# Patient Record
Sex: Female | Born: 1960
Health system: Southern US, Community
[De-identification: ages and names within clinical notes are randomized; demographics above are authoritative.]

## PROBLEM LIST (undated history)

## (undated) DIAGNOSIS — F329 Major depressive disorder, single episode, unspecified: Secondary | ICD-10-CM

## (undated) DIAGNOSIS — K5281 Eosinophilic gastritis or gastroenteritis: Secondary | ICD-10-CM

## (undated) DIAGNOSIS — F419 Anxiety disorder, unspecified: Secondary | ICD-10-CM

## (undated) DIAGNOSIS — Z9889 Other specified postprocedural states: Secondary | ICD-10-CM

## (undated) DIAGNOSIS — Z800 Family history of malignant neoplasm of digestive organs: Secondary | ICD-10-CM

## (undated) DIAGNOSIS — B9681 Helicobacter pylori [H. pylori] as the cause of diseases classified elsewhere: Secondary | ICD-10-CM

## (undated) DIAGNOSIS — K5790 Diverticulosis of intestine, part unspecified, without perforation or abscess without bleeding: Secondary | ICD-10-CM

## (undated) DIAGNOSIS — K2970 Gastritis, unspecified, without bleeding: Secondary | ICD-10-CM

## (undated) DIAGNOSIS — K279 Peptic ulcer, site unspecified, unspecified as acute or chronic, without hemorrhage or perforation: Secondary | ICD-10-CM

## (undated) DIAGNOSIS — R51 Headache: Secondary | ICD-10-CM

## (undated) DIAGNOSIS — F32A Depression, unspecified: Secondary | ICD-10-CM

## (undated) DIAGNOSIS — I10 Essential (primary) hypertension: Secondary | ICD-10-CM

## (undated) HISTORY — DX: Depression, unspecified: F32.A

## (undated) HISTORY — DX: Helicobacter pylori (H. pylori) as the cause of diseases classified elsewhere: B96.81

## (undated) HISTORY — DX: Peptic ulcer, site unspecified, unspecified as acute or chronic, without hemorrhage or perforation: K27.9

## (undated) HISTORY — DX: Gastritis, unspecified, without bleeding: K29.70

## (undated) HISTORY — DX: Other specified postprocedural states: Z98.890

## (undated) HISTORY — DX: Major depressive disorder, single episode, unspecified: F32.9

## (undated) HISTORY — DX: Anxiety disorder, unspecified: F41.9

## (undated) HISTORY — DX: Headache: R51

## (undated) HISTORY — PX: ABDOMINAL HYSTERECTOMY: SHX81

## (undated) HISTORY — DX: Eosinophilic gastritis or gastroenteritis: K52.81

## (undated) HISTORY — PX: TUBAL LIGATION: SHX77

## (undated) HISTORY — DX: Family history of malignant neoplasm of digestive organs: Z80.0

## (undated) HISTORY — DX: Helicobacter pylori (H. pylori) as the cause of diseases classified elsewhere: K29.70

---

## 1987-07-16 HISTORY — PX: OTHER SURGICAL HISTORY: SHX169

## 1995-07-16 HISTORY — PX: CHOLECYSTECTOMY: SHX55

## 2001-07-21 ENCOUNTER — Encounter: Payer: Self-pay | Admitting: Obstetrics and Gynecology

## 2001-07-21 ENCOUNTER — Ambulatory Visit (HOSPITAL_COMMUNITY): Admission: RE | Admit: 2001-07-21 | Discharge: 2001-07-21 | Payer: Self-pay | Admitting: Obstetrics and Gynecology

## 2003-07-11 ENCOUNTER — Emergency Department (HOSPITAL_COMMUNITY): Admission: EM | Admit: 2003-07-11 | Discharge: 2003-07-11 | Payer: Self-pay | Admitting: Emergency Medicine

## 2003-10-19 ENCOUNTER — Emergency Department (HOSPITAL_COMMUNITY): Admission: EM | Admit: 2003-10-19 | Discharge: 2003-10-19 | Payer: Self-pay | Admitting: Emergency Medicine

## 2003-12-09 ENCOUNTER — Ambulatory Visit (HOSPITAL_COMMUNITY): Admission: RE | Admit: 2003-12-09 | Discharge: 2003-12-09 | Payer: Self-pay | Admitting: Family Medicine

## 2003-12-27 ENCOUNTER — Ambulatory Visit (HOSPITAL_COMMUNITY): Admission: RE | Admit: 2003-12-27 | Discharge: 2003-12-27 | Payer: Self-pay | Admitting: Internal Medicine

## 2003-12-27 DIAGNOSIS — Z9889 Other specified postprocedural states: Secondary | ICD-10-CM

## 2003-12-27 HISTORY — DX: Other specified postprocedural states: Z98.890

## 2003-12-27 LAB — HM COLONOSCOPY: HM Colonoscopy: NORMAL

## 2004-08-15 ENCOUNTER — Ambulatory Visit: Payer: Self-pay | Admitting: Family Medicine

## 2004-08-30 ENCOUNTER — Ambulatory Visit (HOSPITAL_COMMUNITY): Admission: RE | Admit: 2004-08-30 | Discharge: 2004-08-30 | Payer: Self-pay | Admitting: Family Medicine

## 2004-10-22 ENCOUNTER — Ambulatory Visit: Payer: Self-pay | Admitting: Family Medicine

## 2005-04-05 ENCOUNTER — Ambulatory Visit: Payer: Self-pay | Admitting: Family Medicine

## 2005-07-15 HISTORY — PX: PARTIAL HYSTERECTOMY: SHX80

## 2005-11-29 ENCOUNTER — Ambulatory Visit: Payer: Self-pay | Admitting: Family Medicine

## 2006-02-04 ENCOUNTER — Other Ambulatory Visit: Admission: RE | Admit: 2006-02-04 | Discharge: 2006-02-04 | Payer: Self-pay | Admitting: Family Medicine

## 2006-02-04 ENCOUNTER — Encounter: Payer: Self-pay | Admitting: Family Medicine

## 2006-02-04 ENCOUNTER — Encounter (INDEPENDENT_AMBULATORY_CARE_PROVIDER_SITE_OTHER): Payer: Self-pay | Admitting: *Deleted

## 2006-02-04 ENCOUNTER — Ambulatory Visit: Payer: Self-pay | Admitting: Family Medicine

## 2006-02-04 LAB — CONVERTED CEMR LAB: Pap Smear: NORMAL

## 2006-04-23 ENCOUNTER — Encounter (INDEPENDENT_AMBULATORY_CARE_PROVIDER_SITE_OTHER): Payer: Self-pay | Admitting: Specialist

## 2006-04-23 ENCOUNTER — Ambulatory Visit (HOSPITAL_COMMUNITY): Admission: RE | Admit: 2006-04-23 | Discharge: 2006-04-24 | Payer: Self-pay | Admitting: Obstetrics and Gynecology

## 2006-05-15 ENCOUNTER — Ambulatory Visit (HOSPITAL_COMMUNITY): Admission: RE | Admit: 2006-05-15 | Discharge: 2006-05-15 | Payer: Self-pay | Admitting: Family Medicine

## 2006-05-27 ENCOUNTER — Encounter: Admission: RE | Admit: 2006-05-27 | Discharge: 2006-05-27 | Payer: Self-pay | Admitting: Family Medicine

## 2006-12-26 ENCOUNTER — Ambulatory Visit: Payer: Self-pay | Admitting: Family Medicine

## 2006-12-26 LAB — CONVERTED CEMR LAB
Calcium: 10 mg/dL (ref 8.4–10.5)
Chloride: 107 meq/L (ref 96–112)
Creatinine, Ser: 0.9 mg/dL (ref 0.40–1.20)
Glucose, Bld: 98 mg/dL (ref 70–99)
Potassium: 4.2 meq/L (ref 3.5–5.3)

## 2007-03-23 ENCOUNTER — Encounter: Payer: Self-pay | Admitting: Family Medicine

## 2007-03-23 ENCOUNTER — Ambulatory Visit: Payer: Self-pay | Admitting: Family Medicine

## 2007-03-23 ENCOUNTER — Encounter (INDEPENDENT_AMBULATORY_CARE_PROVIDER_SITE_OTHER): Payer: Self-pay | Admitting: *Deleted

## 2007-03-23 ENCOUNTER — Other Ambulatory Visit: Admission: RE | Admit: 2007-03-23 | Discharge: 2007-03-23 | Payer: Self-pay | Admitting: Family Medicine

## 2007-03-24 ENCOUNTER — Encounter: Payer: Self-pay | Admitting: Family Medicine

## 2007-03-26 ENCOUNTER — Encounter: Payer: Self-pay | Admitting: Family Medicine

## 2007-03-26 LAB — CONVERTED CEMR LAB
ALT: 10 units/L (ref 0–35)
AST: 13 units/L (ref 0–37)
Alkaline Phosphatase: 147 units/L — ABNORMAL HIGH (ref 39–117)
BUN: 13 mg/dL (ref 6–23)
Basophils Absolute: 0 10*3/uL (ref 0.0–0.1)
Calcium: 8.8 mg/dL (ref 8.4–10.5)
Cholesterol: 124 mg/dL (ref 0–200)
Creatinine, Ser: 0.9 mg/dL (ref 0.40–1.20)
Eosinophils Relative: 2 % (ref 0–5)
Glucose, Bld: 98 mg/dL (ref 70–99)
HCT: 40 % (ref 36.0–46.0)
Indirect Bilirubin: 0.5 mg/dL (ref 0.0–0.9)
Lymphocytes Relative: 34 % (ref 12–46)
Lymphs Abs: 2.2 10*3/uL (ref 0.7–3.3)
MCHC: 34 g/dL (ref 30.0–36.0)
Neutro Abs: 3.6 10*3/uL (ref 1.7–7.7)
Neutrophils Relative %: 57 % (ref 43–77)
RBC: 4.44 M/uL (ref 3.87–5.11)
Total Bilirubin: 0.7 mg/dL (ref 0.3–1.2)
Triglycerides: 71 mg/dL (ref <150)
VLDL: 14 mg/dL (ref 0–40)

## 2007-03-31 ENCOUNTER — Encounter: Payer: Self-pay | Admitting: Family Medicine

## 2007-06-23 ENCOUNTER — Ambulatory Visit (HOSPITAL_COMMUNITY): Admission: RE | Admit: 2007-06-23 | Discharge: 2007-06-23 | Payer: Self-pay | Admitting: Family Medicine

## 2007-07-14 ENCOUNTER — Ambulatory Visit: Payer: Self-pay | Admitting: Family Medicine

## 2007-07-16 ENCOUNTER — Encounter: Payer: Self-pay | Admitting: Family Medicine

## 2007-11-11 ENCOUNTER — Encounter (INDEPENDENT_AMBULATORY_CARE_PROVIDER_SITE_OTHER): Payer: Self-pay | Admitting: *Deleted

## 2007-11-11 DIAGNOSIS — F411 Generalized anxiety disorder: Secondary | ICD-10-CM | POA: Insufficient documentation

## 2007-11-11 DIAGNOSIS — R51 Headache: Secondary | ICD-10-CM

## 2007-11-11 DIAGNOSIS — F329 Major depressive disorder, single episode, unspecified: Secondary | ICD-10-CM

## 2007-11-11 DIAGNOSIS — R519 Headache, unspecified: Secondary | ICD-10-CM | POA: Insufficient documentation

## 2007-12-23 ENCOUNTER — Ambulatory Visit: Payer: Self-pay | Admitting: Family Medicine

## 2008-03-30 ENCOUNTER — Ambulatory Visit: Payer: Self-pay | Admitting: Family Medicine

## 2008-03-30 ENCOUNTER — Encounter: Payer: Self-pay | Admitting: Family Medicine

## 2008-03-30 ENCOUNTER — Other Ambulatory Visit: Admission: RE | Admit: 2008-03-30 | Discharge: 2008-03-30 | Payer: Self-pay | Admitting: Family Medicine

## 2008-03-30 DIAGNOSIS — N63 Unspecified lump in unspecified breast: Secondary | ICD-10-CM | POA: Insufficient documentation

## 2008-03-30 DIAGNOSIS — N644 Mastodynia: Secondary | ICD-10-CM

## 2008-03-30 DIAGNOSIS — R5383 Other fatigue: Secondary | ICD-10-CM

## 2008-03-30 DIAGNOSIS — E669 Obesity, unspecified: Secondary | ICD-10-CM

## 2008-03-30 DIAGNOSIS — R079 Chest pain, unspecified: Secondary | ICD-10-CM | POA: Insufficient documentation

## 2008-03-30 DIAGNOSIS — R5381 Other malaise: Secondary | ICD-10-CM | POA: Insufficient documentation

## 2008-03-30 LAB — CONVERTED CEMR LAB: Pap Smear: NORMAL

## 2008-04-04 ENCOUNTER — Ambulatory Visit (HOSPITAL_COMMUNITY): Admission: RE | Admit: 2008-04-04 | Discharge: 2008-04-04 | Payer: Self-pay | Admitting: Family Medicine

## 2008-06-30 ENCOUNTER — Ambulatory Visit: Payer: Self-pay | Admitting: Family Medicine

## 2008-07-13 ENCOUNTER — Ambulatory Visit (HOSPITAL_COMMUNITY): Admission: RE | Admit: 2008-07-13 | Discharge: 2008-07-13 | Payer: Self-pay | Admitting: Family Medicine

## 2008-08-05 ENCOUNTER — Encounter: Payer: Self-pay | Admitting: Family Medicine

## 2008-08-29 ENCOUNTER — Telehealth: Payer: Self-pay | Admitting: Family Medicine

## 2009-02-21 ENCOUNTER — Encounter: Payer: Self-pay | Admitting: Family Medicine

## 2009-03-16 ENCOUNTER — Ambulatory Visit: Payer: Self-pay | Admitting: Family Medicine

## 2009-03-17 ENCOUNTER — Encounter: Payer: Self-pay | Admitting: Family Medicine

## 2009-03-22 ENCOUNTER — Ambulatory Visit (HOSPITAL_COMMUNITY): Admission: RE | Admit: 2009-03-22 | Discharge: 2009-03-22 | Payer: Self-pay | Admitting: Neurology

## 2009-03-27 ENCOUNTER — Encounter: Payer: Self-pay | Admitting: Family Medicine

## 2009-06-26 ENCOUNTER — Encounter (INDEPENDENT_AMBULATORY_CARE_PROVIDER_SITE_OTHER): Payer: Self-pay | Admitting: *Deleted

## 2009-08-29 ENCOUNTER — Ambulatory Visit: Payer: Self-pay | Admitting: Family Medicine

## 2009-08-29 DIAGNOSIS — R42 Dizziness and giddiness: Secondary | ICD-10-CM

## 2009-09-01 ENCOUNTER — Ambulatory Visit (HOSPITAL_COMMUNITY): Admission: RE | Admit: 2009-09-01 | Discharge: 2009-09-01 | Payer: Self-pay | Admitting: Family Medicine

## 2009-10-17 ENCOUNTER — Other Ambulatory Visit: Admission: RE | Admit: 2009-10-17 | Discharge: 2009-10-17 | Payer: Self-pay | Admitting: Family Medicine

## 2009-10-17 ENCOUNTER — Ambulatory Visit: Payer: Self-pay | Admitting: Family Medicine

## 2009-10-17 LAB — CONVERTED CEMR LAB
OCCULT 1: NEGATIVE
Pap Smear: NEGATIVE

## 2009-10-18 LAB — CONVERTED CEMR LAB
BUN: 11 mg/dL (ref 6–23)
CO2: 19 meq/L (ref 19–32)
Eosinophils Absolute: 0.1 10*3/uL (ref 0.0–0.7)
HCT: 40.6 % (ref 36.0–46.0)
LDL Cholesterol: 58 mg/dL (ref 0–99)
Lymphocytes Relative: 38 % (ref 12–46)
MCHC: 32.5 g/dL (ref 30.0–36.0)
MCV: 90.8 fL (ref 78.0–100.0)
Monocytes Relative: 7 % (ref 3–12)
Neutro Abs: 3.5 10*3/uL (ref 1.7–7.7)
Neutrophils Relative %: 53 % (ref 43–77)
Potassium: 4 meq/L (ref 3.5–5.3)
TSH: 0.38 microintl units/mL (ref 0.350–4.500)
Total CHOL/HDL Ratio: 2.9
Triglycerides: 99 mg/dL (ref <150)
VLDL: 20 mg/dL (ref 0–40)
Vit D, 25-Hydroxy: 10 ng/mL — ABNORMAL LOW (ref 30–89)

## 2009-11-17 ENCOUNTER — Encounter: Payer: Self-pay | Admitting: Family Medicine

## 2010-01-05 ENCOUNTER — Encounter (INDEPENDENT_AMBULATORY_CARE_PROVIDER_SITE_OTHER): Payer: Self-pay

## 2010-01-05 ENCOUNTER — Encounter: Payer: Self-pay | Admitting: Family Medicine

## 2010-01-05 ENCOUNTER — Ambulatory Visit: Payer: Self-pay | Admitting: Gastroenterology

## 2010-01-05 ENCOUNTER — Inpatient Hospital Stay (HOSPITAL_COMMUNITY): Admission: EM | Admit: 2010-01-05 | Discharge: 2010-01-06 | Payer: Self-pay | Admitting: Emergency Medicine

## 2010-01-08 ENCOUNTER — Telehealth: Payer: Self-pay | Admitting: Family Medicine

## 2010-01-12 ENCOUNTER — Ambulatory Visit: Payer: Self-pay | Admitting: Family Medicine

## 2010-01-12 DIAGNOSIS — K269 Duodenal ulcer, unspecified as acute or chronic, without hemorrhage or perforation: Secondary | ICD-10-CM | POA: Insufficient documentation

## 2010-01-12 DIAGNOSIS — A048 Other specified bacterial intestinal infections: Secondary | ICD-10-CM | POA: Insufficient documentation

## 2010-01-17 ENCOUNTER — Telehealth (INDEPENDENT_AMBULATORY_CARE_PROVIDER_SITE_OTHER): Payer: Self-pay | Admitting: *Deleted

## 2010-03-01 ENCOUNTER — Encounter: Payer: Self-pay | Admitting: Gastroenterology

## 2010-03-12 ENCOUNTER — Encounter: Payer: Self-pay | Admitting: Family Medicine

## 2010-03-26 ENCOUNTER — Emergency Department (HOSPITAL_COMMUNITY): Admission: EM | Admit: 2010-03-26 | Discharge: 2010-03-26 | Payer: Self-pay | Admitting: Emergency Medicine

## 2010-03-26 ENCOUNTER — Ambulatory Visit: Payer: Self-pay | Admitting: Family Medicine

## 2010-03-26 LAB — CONVERTED CEMR LAB
Bilirubin Urine: NEGATIVE
Blood in Urine, dipstick: NEGATIVE
Glucose, Urine, Semiquant: NEGATIVE
Ketones, urine, test strip: NEGATIVE
Nitrite: NEGATIVE
Protein, U semiquant: NEGATIVE
Specific Gravity, Urine: >=1.03
Urobilinogen, UA: 0.2
WBC Urine, dipstick: NEGATIVE
pH: 5

## 2010-03-29 ENCOUNTER — Encounter: Payer: Self-pay | Admitting: Physician Assistant

## 2010-03-29 ENCOUNTER — Ambulatory Visit: Payer: Self-pay | Admitting: Family Medicine

## 2010-03-29 DIAGNOSIS — N83209 Unspecified ovarian cyst, unspecified side: Secondary | ICD-10-CM

## 2010-07-05 ENCOUNTER — Ambulatory Visit: Payer: Self-pay | Admitting: Family Medicine

## 2010-07-06 ENCOUNTER — Encounter: Payer: Self-pay | Admitting: Family Medicine

## 2010-08-05 ENCOUNTER — Encounter: Payer: Self-pay | Admitting: Family Medicine

## 2010-08-12 LAB — CONVERTED CEMR LAB
BUN: 10 mg/dL (ref 6–23)
CO2: 20 meq/L (ref 19–32)
Calcium: 8.8 mg/dL (ref 8.4–10.5)
Cholesterol: 132 mg/dL (ref 0–200)
Creatinine, Ser: 0.91 mg/dL (ref 0.40–1.20)
Glucose, Bld: 82 mg/dL (ref 70–99)
HDL: 43 mg/dL (ref >39)
Lymphocytes Relative: 40 % (ref 12–46)
MCHC: 33.4 g/dL (ref 30.0–36.0)
MCV: 90.4 fL (ref 78.0–100.0)
Monocytes Relative: 8 % (ref 3–12)
Platelets: 264 10*3/uL (ref 150–400)
Potassium: 4.2 meq/L (ref 3.5–5.3)
RBC: 4.27 M/uL (ref 3.87–5.11)
RDW: 12.6 % (ref 11.5–15.5)
Total CHOL/HDL Ratio: 3.1
Triglycerides: 104 mg/dL (ref <150)
VLDL: 21 mg/dL (ref 0–40)
WBC: 6.2 10*3/uL (ref 4.0–10.5)

## 2010-08-14 NOTE — Assessment & Plan Note (Signed)
Summary: abdominal pain- room 2   Vital Signs:  Patient profile:   50 year old female Menstrual status:  hysterectomy Height:      64 inches Weight:      185 pounds BMI:     31.87 O2 Sat:      99 % on Room air Temp:     98.6 degrees F oral Pulse rate:   88 / minute Resp:     16 per minute BP sitting:   130 / 70  (left arm)  Vitals Entered By: Adella Hare LPN (March 26, 2010 2:52 PM) CC: lower abdominal pain Is Patient Diabetic? No   CC:  lower abdominal pain.  History of Present Illness: Pt presents today with c/o onset of lower abd pain at midnight last night.  Since then has moved more to RLQ.  Nausea today but no vomiting.  Denies fever or chills.  No BM today but had nl BM yesterday.    Allergies (verified): 1)  ! Codeine 2)  ! Hydrocodone  Past History:  Past medical history reviewed for relevance to current acute and chronic problems.  Past Medical History: Reviewed history from 01/12/2010 and no changes required. Current Problems:  ANXIETY (ICD-300.00) DEPRESSION, MILD (ICD-311) HEADACHE (ICD-784.0) duoodenal ulcer, duodenitis and gastritis hospitalised 6/23 to 6 25/2011 because of this h pylori disease treated in june 2011   Review of Systems General:  Complains of loss of appetite. CV:  Denies chest pain or discomfort. GI:  Complains of abdominal pain, loss of appetite, and nausea. GU:  Denies dysuria and urinary frequency.  Physical Exam  General:  Well-developed,well-nourished,in no acute distress; alert,appropriate and cooperative throughout examination Head:  Normocephalic and atraumatic without obvious abnormalities. No apparent alopecia or balding. Ears:  External ear exam shows no significant lesions or deformities.  Otoscopic examination reveals clear canals, tympanic membranes are intact bilaterally without bulging, retraction, inflammation or discharge. Hearing is grossly normal bilaterally. Nose:  External nasal examination shows no  deformity or inflammation. Nasal mucosa are pink and moist without lesions or exudates. Mouth:  Oral mucosa and oropharynx without lesions or exudates.  Teeth in good repair. Neck:  No deformities, masses, or tenderness noted. Lungs:  Normal respiratory effort, chest expands symmetrically. Lungs are clear to auscultation, no crackles or wheezes. Heart:  Normal rate and regular rhythm. S1 and S2 normal without gallop, murmur, click, rub or other extra sounds. Abdomen:  soft, normal bowel sounds, no masses, no hepatomegaly, and no splenomegaly.  + TTP RLQ with guardingsoft, normal bowel sounds, no masses, no hepatomegaly, and no splenomegaly.   Cervical Nodes:  No lymphadenopathy noted Psych:  Cognition and judgment appear intact. Alert and cooperative with normal attention span and concentration. No apparent delusions, illusions, hallucinations   Impression & Recommendations:  Problem # 1:  ABDOMINAL PAIN RIGHT LOWER QUADRANT (ICD-789.03) Assessment New Discussed with pt that she will need to go to the ER for further eval to r/o appendicitis.  Complete Medication List: 1)  Restoril 30 Mg Caps (Temazepam) .... Take one by mouth at bedtime prn sleep 2)  Topamax 25 Mg Tabs (Topiramate) .... Two tablets at bedtime 3)  Alagesic 50-325-40 Mg Caps (Butalbital-apap-caffeine) .... One at headache onset rept after 4 to 6 hrs if headache persits 4)  Vitamin D (ergocalciferol) 50000 Unit Caps (Ergocalciferol) .... One tab by mouth every week 5)  Tramadol Hcl 50 Mg Tabs (Tramadol hcl) .... One tab by mouth three times a day as needed  discontinue hydrocodone  6)  Pantoprazole Sodium 40 Mg Tbec (Pantoprazole sodium) .... Take 1 tablet by mouth two times a day 7)  Amoxicillin 500 Mg Caps (Amoxicillin) .... Take 1 tablet by mouth two times a day 8)  Clarithromycin 500 Mg Tabs (Clarithromycin) .... Take 1 tablet by mouth two times a day  Other Orders: Urinalysis ([id number])  Patient Instructions: 1)  Go  to the ER for further evaluation of your abdominal pain.  Laboratory Results   Urine Tests  Date/Time Received: March 26, 2010 2:57 PM  Date/Time Reported: March 26, 2010 2:57 PM   Routine Urinalysis   Color: yellow Appearance: Clear Glucose: negative   (Normal Range: Negative) Bilirubin: negative   (Normal Range: Negative) Ketone: negative   (Normal Range: Negative) Spec. Gravity: >=1.030   (Normal Range: 1.003-1.035) Blood: negative   (Normal Range: Negative) pH: 5.0   (Normal Range: 5.0-8.0) Protein: negative   (Normal Range: Negative) Urobilinogen: 0.2   (Normal Range: 0-1) Nitrite: negative   (Normal Range: Negative) Leukocyte Esterace: negative   (Normal Range: Negative)

## 2010-08-14 NOTE — Letter (Signed)
Summary: history and physical  history and physical   Imported By: Lind Guest 11/27/2009 14:24:07  _____________________________________________________________________  External Attachment:    Type:   Image     Comment:   External Document

## 2010-08-14 NOTE — Assessment & Plan Note (Signed)
Summary: office visit   Vital Signs:  Patient profile:   50 year old female Menstrual status:  hysterectomy Height:      64 inches Weight:      191 pounds BMI:     32.90 O2 Sat:      98 % Pulse rate:   89 / minute Pulse rhythm:   regular Resp:     16 per minute BP sitting:   112 / 80  (left arm)  Vitals Entered By: Everitt Amber (August 29, 2009 9:51 AM)  Nutrition Counseling: Patient's BMI is greater than 25 and therefore counseled on weight management options. CC: Has been having vertigio again in [place], it happened twice so she wants more meclizine. Is Patient Diabetic? No Pain Assessment Patient in pain? no        CC:  Has been having vertigio again in [place] and it happened twice so she wants more meclizine.Marland Kitchen  History of Present Illness: Pt has had approx 4 episodes of vertigo since Sept. No associated viral syndrome before the attacks. duration is on avg one day, responds to antivert, pt gets nauseated with the vertigo and often gets a headachOn ione ovvassion she was awakened from her sleep with headache.e. She has not been taking topamax for prophylaxis, and did not tolerate immitrex, uses oTC iuprofen for pain relief/  Reports  that she has otherwise been  doing well. Denies recent fever or chills. Denies sinus pressure, nasal congestion , ear pain or sore throat. Denies chest congestion, or cough productive of sputum. Denies chest pain, palpitations, PND, orthopnea or leg swelling. Denies abdominal pain, nausea, vomitting, diarrhea or constipation. Denies change in bowel movements or bloody stool. Denies dysuria , frequency, incontinence or hesitancy. Denies  joint pain, swelling, or reduced mobility.  Denies depression, anxiety or insomnia. Denies  rash, lesions, or itch. She has not been exercising regularly, but intends to do this to improve her health      Allergies: 1)  ! Codeine  Review of Systems      See HPI Eyes:  Denies blurring and  discharge. Neuro:  Complains of headaches and sensation of room spinning; denies seizures. Endo:  Denies cold intolerance, excessive hunger, excessive thirst, excessive urination, heat intolerance, polyuria, and weight change. Heme:  Denies abnormal bruising and bleeding. Allergy:  Complains of seasonal allergies.  Physical Exam  General:  alert, well-hydrated, and overweight-appearing.  pt in pain. HEENT: No facial asymmetry,  EOMI, No sinus tenderness, TM's Clear, oropharynx  pink and moist.   Chest: Clear to auscultation bilaterally.  CVS: S1, S2, No murmurs, No S3.   Abd: Soft, Nontender.  MS: Adequate ROM spine, hips, shoulders and knees.  Ext: No edema.   CNS: CN 2-12 intact, power tone and sensation normal throughout.   Skin: Intact, no visible lesions or rashes.  Psych: Good eye contact, normal affect.  Memory intact, anxious appearing   Impression & Recommendations:  Problem # 1:  INTERMITTENT VERTIGO (ICD-780.4) Assessment Deteriorated  Her updated medication list for this problem includes:    Meclizine Hcl 12.5 Mg Tabs (Meclizine hcl) .Marland Kitchen... Take 1 tablet by mouth every 6-8 hours as needed    Promethazine Hcl 25 Mg Tabs (Promethazine hcl) .Marland Kitchen... Take 1 tablet by mouth two times a day as needed  Future Orders: ENT Referral (ENT) ... 08/30/2009  Problem # 2:  OBESITY (ICD-278.00) Assessment: Unchanged  Ht: 64 (08/29/2009)   Wt: 191 (08/29/2009)   BMI: 32.90 (08/29/2009)  Problem # 3:  HEADACHE (ICD-784.0) Assessment: Deteriorated  The following medications were removed from the medication list:    Alagesic 50-325-40 Mg Caps (Butalbital-apap-caffeine) .Marland Kitchen... Take one - two every four hours as needed headadche Her updated medication list for this problem includes:    Ibuprofen 800 Mg Tabs (Ibuprofen) ..... One at headache onset, repeat after 6 hrs if needed for uncontrolled headache    Alagesic 50-325-40 Mg Caps (Butalbital-apap-caffeine) ..... One at headache onset  rept after 4 to 6 hrs if headache persits  Complete Medication List: 1)  Restoril 30 Mg Caps (Temazepam) .... Take one by mouth at bedtime prn sleep 2)  Meclizine Hcl 12.5 Mg Tabs (Meclizine hcl) .... Take 1 tablet by mouth every 6-8 hours as needed 3)  Hydrochlorothiazide 12.5 Mg Tabs (Hydrochlorothiazide) .... Take 1 tablet by mouth once a day 4)  Klor-con 10 10 Meq Cr-tabs (Potassium chloride) .... Take 1 tablet by mouth once a day 5)  Promethazine Hcl 25 Mg Tabs (Promethazine hcl) .... Take 1 tablet by mouth two times a day as needed 6)  Topamax 25 Mg Tabs (Topiramate) .... Two tablets at bedtime 7)  Ibuprofen 800 Mg Tabs (Ibuprofen) .... One at headache onset, repeat after 6 hrs if needed for uncontrolled headache 8)  Alagesic 50-325-40 Mg Caps (Butalbital-apap-caffeine) .... One at headache onset rept after 4 to 6 hrs if headache persits  Other Orders: T-Basic Metabolic Panel [id number]) T-Lipid Profile [id number]) T-CBC w/Diff [id number]) T-TSH [phone]) Radiology Referral (Radiology)  Patient Instructions: 1)  CPE in March or April. 2)  It is important that you exercise regularly at least 20 minutes 5 times a week. If you develop chest pain, have severe difficulty breathing, or feel very tired , stop exercising immediately and seek medical attention. 3)  You need to lose weight. Consider a lower calorie diet and regular exercise.  4)  Youi are being referred to eNT and for your mamo, take meds a sprescribed pls Prescriptions: ALAGESIC 50-325-40 MG CAPS (BUTALBITAL-APAP-CAFFEINE) one at headache onset rept after 4 to 6 hrs if headache persits  #50 x 0   Entered by:   Everitt Amber LPN   Authorized by:   Syliva Overman MD   Signed by:   Everitt Amber LPN on 70/62/3762   Method used:   Printed then faxed to ...       [facility] [address] [phone]* (retail)       [address] [place], [place]  [address]       Ph: [phone]       Fax: [phone]   RxID:    [id number] RESTORIL 30 MG CAPS (TEMAZEPAM) take one by mouth at bedtime prn sleep  #30 x 2   Entered by:   Everitt Amber LPN   Authorized by:   Syliva Overman MD   Signed by:   Everitt Amber LPN on 89/38/1017   Method used:   Printed then faxed to ...       [facility] [address] [phone]* (retail)       [address] [place], [place]  [address]       Ph: [phone]       Fax: [phone]   RxID:   [id number] ALAGESIC 50-325-40 MG CAPS [facility]) one at headache onset rept after 4 to 6 hrs if headache persits  #50 x 0   Entered and Authorized by:   Syliva Overman MD   Signed by:  Syliva Overman MD on 08/29/2009   Method used:   Electronically to        [facility]. [phone]* (retail)       [address] [place], [place]  [address]       Ph: [phone]       Fax: [phone]   RxID:   [id number] IBUPROFEN 800 MG TABS (IBUPROFEN) one at headache onset, repeat after 6 hrs if needed for uncontrolled headache  #30 x 0   Entered and Authorized by:   Syliva Overman MD   Signed by:   Syliva Overman MD on 08/29/2009   Method used:   Electronically to        [facility] [address] [phone]* (retail)       [address] [place], [place]  [address]       Ph: [phone]       Fax: [phone]   RxID:   [id number] TOPAMAX 25 MG TABS (TOPIRAMATE) two tablets at bedtime  #60 x 3   Entered and Authorized by:   Syliva Overman MD   Signed by:   Syliva Overman MD on 08/29/2009   Method used:   Electronically to        [facility] [address] [phone]* (retail)       [address], [place]  [address]       Ph: [phone]       Fax: [phone]   RxID:   [id number]

## 2010-08-14 NOTE — Assessment & Plan Note (Signed)
Summary: f up from ed   Vital Signs:  Patient profile:   50 year old female Menstrual status:  hysterectomy Height:      64 inches Weight:      185 pounds BMI:     31.87 O2 Sat:      98 % Pulse rate:   81 / minute Pulse rhythm:   regular Resp:     16 per minute BP sitting:   124 / 80  (left arm) Cuff size:   large  Vitals Entered By: Everitt Amber LPN (January 12, 1609 10:08 AM)  Nutrition Counseling: Patient's BMI is greater than 25 and therefore counseled on weight management options. CC: ER follow up, abdominal pain   CC:  ER follow up and abdominal pain.  History of Present Illness: pt suffered for 1 week withabdominalpain of unclearetiology before gooing to the hospitalon 6/23 when she was admitted for duodenitis, gastritis and duosdenal ulcer. this is her second hospitalisation for ulcers. pt reurned to work on 6/28, however she had to lv early on June 29 because of lower abd pain. she is requesting lv to return on July 4. she has used tramadol with relief for her lower abdominal pain, and she understand clearly not to use any anti-inflammatory pain meds. she is currently being treated for h pylori. Nprmal apetite and bowel movememnts  Current Medications (verified): 1)  Restoril 30 Mg Caps (Temazepam) .... Take One By Mouth At Bedtime Prn Sleep 2)  Topamax 25 Mg Tabs (Topiramate) .... Two Tablets At Bedtime 3)  Alagesic 50-325-40 Mg Caps (Butalbital-Apap-Caffeine) .... One At Headache Onset Rept After 4 To 6 Hrs If Headache Persits 4)  Vitamin D (Ergocalciferol) 50000 Unit Caps (Ergocalciferol) .... One Tab By Mouth Every Week 5)  Tramadol Hcl 50 Mg Tabs (Tramadol Hcl) .... One Tab By Mouth Three Times A Day As Needed  Discontinue Hydrocodone 6)  Pantoprazole Sodium 40 Mg Tbec (Pantoprazole Sodium) .... Take 1 Tablet By Mouth Two Times A Day 7)  Amoxicillin 500 Mg Caps (Amoxicillin) .... Take 1 Tablet By Mouth Two Times A Day 8)  Clarithromycin 500 Mg Tabs (Clarithromycin)  .... Take 1 Tablet By Mouth Two Times A Day  Allergies (verified): 1)  ! Codeine 2)  ! Hydrocodone  Past History:  Past medical, surgical, family and social histories (including risk factors) reviewed, and no changes noted (except as noted below).  Past Medical History: Current Problems:  ANXIETY (ICD-300.00) DEPRESSION, MILD (ICD-311) HEADACHE (ICD-784.0) duoodenal ulcer, duodenitis and gastritis hospitalised 6/23 to 6 25/2011 because of this h pylori disease treated in june 2011   Past Surgical History: Reviewed history from 03/30/2008 and no changes required. Cholecystectomy (1997) BTL (1989) partial hysterectomy (fibroids) 2007  Family History: Reviewed history from 03/30/2008 and no changes required. Mom Iddm,Htn,CVA mother colon ca Dad MI,DM,HTN deceased at age 37 Sister 3 living one sister has colon cancer Brothers 8 living 1 has lupus, 1 deceased age 50, parkinsons  Social History: Reviewed history from 11/11/2007 and no changes required. Occupation:Employed  Married 3 sons Former Smoker Alcohol use-no Drug use-no  Review of Systems      See HPI General:  Denies chills and fever. Eyes:  Denies discharge, eye pain, and red eye. ENT:  Denies hoarseness, nasal congestion, sinus pressure, and sore throat. CV:  Denies chest pain or discomfort, difficulty breathing while lying down, palpitations, and swelling of feet. Resp:  Denies cough and sputum productive. GI:  Complains of abdominal pain; GENERALISED ABDOMINAL  PAIN, ESP WITH LIFTING , SORE FEELING IN HER STOMACH. GU:  Denies dysuria and urinary frequency. MS:  Denies joint pain and stiffness. Psych:  Denies anxiety and depression. Endo:  Denies excessive thirst and excessive urination. Heme:  Denies abnormal bruising and bleeding. Allergy:  Denies hives or rash and itching eyes.  Physical Exam  General:  Well-developed,well-nourished,in no acute distress; alert,appropriate and cooperative throughout  examination HEENT: No facial asymmetry,  EOMI, No sinus tenderness, TM's Clear, oropharynx  pink and moist.   Chest: Clear to auscultation bilaterally.  CVS: S1, S2, No murmurs, No S3.   Abd: Soft, diffuse superficial abdominal tenderness,no guarding or rebound, normal bowel sounds  MS: Adequate ROM spine, hips, shoulders and knees.  Ext: No edema.   CNS: CN 2-12 intact, power tone and sensation normal throughout.   Skin: Intact, no visible lesions or rashes.  Psych: Good eye contact, normal affect.  Memory intact, not anxious or depressed appearing.    Impression & Recommendations:  Problem # 1:  HELICOBACTER PYLORI GASTRITIS (ICD-041.86) Assessment Comment Only pt to take entire course of treatment, this was stressed at OV , she already understood  Problem # 2:  DUODENAL ULCER (ICD-532.90) Assessment: Comment Only  Her updated medication list for this problem includes:    Pantoprazole Sodium 40 Mg Tbec (Pantoprazole sodium) .Marland Kitchen... Take 1 tablet by mouth two times a day, will d/c all NSAIDS  Problem # 3:  HEADACHE (ICD-784.0) Assessment: Comment Only  The following medications were removed from the medication list:    Ibuprofen 800 Mg Tabs (Ibuprofen) ..... One at headache onset, repeat after 6 hrs if needed for uncontrolled headache Her updated medication list for this problem includes:    Alagesic 50-325-40 Mg Caps (Butalbital-apap-caffeine) ..... One at headache onset rept after 4 to 6 hrs if headache persits    Tramadol Hcl 50 Mg Tabs (Tramadol hcl) ..... One tab by mouth three times a day as needed  discontinue hydrocodone currently being followed by neurology  Complete Medication List: 1)  Restoril 30 Mg Caps (Temazepam) .... Take one by mouth at bedtime prn sleep 2)  Topamax 25 Mg Tabs (Topiramate) .... Two tablets at bedtime 3)  Alagesic 50-325-40 Mg Caps (Butalbital-apap-caffeine) .... One at headache onset rept after 4 to 6 hrs if headache persits 4)  Vitamin D  (ergocalciferol) 50000 Unit Caps (Ergocalciferol) .... One tab by mouth every week 5)  Tramadol Hcl 50 Mg Tabs (Tramadol hcl) .... One tab by mouth three times a day as needed  discontinue hydrocodone 6)  Pantoprazole Sodium 40 Mg Tbec (Pantoprazole sodium) .... Take 1 tablet by mouth two times a day 7)  Amoxicillin 500 Mg Caps (Amoxicillin) .... Take 1 tablet by mouth two times a day 8)  Clarithromycin 500 Mg Tabs (Clarithromycin) .... Take 1 tablet by mouth two times a day  Patient Instructions: 1)  Please schedule a follow-up appointment in 4.5 months. 2)  Please remember no anti-inflammatory pain meds. 3)  pls take all of the med for you stomach infection.  4)  pls rest over the weekend so that you can return to work next week much better

## 2010-08-14 NOTE — Letter (Signed)
Summary: X RAYS  X RAYS   Imported By: Lind Guest 11/27/2009 14:28:28  _____________________________________________________________________  External Attachment:    Type:   Image     Comment:   External Document

## 2010-08-14 NOTE — Progress Notes (Signed)
Summary: medicine making her sick  Phone Note Call from Patient   Summary of Call: appt. with dr. Algie Coffer friday at 10:15 she went to ed on thursday and kept her until saturday and the medicine hydrcodone is making her dizzy and sick and wants something called in at [facility] call back at [phone] Initial call taken by: Lind Guest,  January 08, 2010 8:29 AM  Follow-up for Phone Call        advise and erx tramadol 50mg  one 3 times daily as needed #30 stamp and fax write d/c hydrocodone and tell pt not to takwe both pls Follow-up by: Syliva Overman MD,  January 08, 2010 5:03 PM  Additional Follow-up for Phone Call Additional follow up Details #1::        patient aware Additional Follow-up by: Adella Hare LPN,  January 08, 2010 5:07 PM    New/Updated Medications: TRAMADOL HCL 50 MG TABS (TRAMADOL HCL) one tab by mouth three times a day as needed  discontinue hydrocodone Prescriptions: TRAMADOL HCL 50 MG TABS (TRAMADOL HCL) one tab by mouth three times a day as needed  discontinue hydrocodone  #30 x 0   Entered by:   Adella Hare LPN   Authorized by:   Syliva Overman MD   Signed by:   Adella Hare LPN on 16/04/9603   Method used:   Electronically to        [facility] [address] [phone]* (retail)       [address], [place]  [address]       Ph: [phone]       Fax: [phone]   RxID:   [id number]

## 2010-08-14 NOTE — Letter (Signed)
Summary: demo  demo   Imported By: Lind Guest 11/27/2009 14:19:58  _____________________________________________________________________  External Attachment:    Type:   Image     Comment:   External Document

## 2010-08-14 NOTE — Assessment & Plan Note (Signed)
Summary: ER follow up ROOM 1    Vital Signs:  Patient profile:   50 year old female Menstrual status:  hysterectomy Height:      64 inches Weight:      184 pounds BMI:     31.70 O2 Sat:      97 % Pulse rate:   76 / minute Resp:     16 per minute BP sitting:   130 / 82  (left arm) Cuff size:   large  Vitals Entered By: Everitt Amber LPN (March 29, 2010 1:29 PM) CC: ER follow up, had ruptured ovarian cyst on right side   CC:  ER follow up and had ruptured ovarian cyst on right side.  History of Present Illness: Pt presents today for ER f/u. Was dxd with ruptured Rt ovarian cyst on CT. Pain is improving, is now a 2 on scale of 1-10.  c/o nausea and decreased appetite.  BMs nl.  Hx of partial hyst.  No prev  hx of ovarian cysts. Pt is very "worried".  Hx of colon cancer in her family. She has had a colonoscopy in the past.  Despite my reassurance and discussion of f/u pelvic ultrasound, pt remains very anxious and worried.      Current Medications (verified): 1)  Restoril 30 Mg Caps (Temazepam) .... Take One By Mouth At Bedtime Prn Sleep 2)  Topamax 25 Mg Tabs (Topiramate) .... Two Tablets At Bedtime 3)  Alagesic 50-325-40 Mg Caps (Butalbital-Apap-Caffeine) .... One At Headache Onset Rept After 4 To 6 Hrs If Headache Persits 4)  Vitamin D (Ergocalciferol) 50000 Unit Caps (Ergocalciferol) .... One Tab By Mouth Every Week 5)  Tramadol Hcl 50 Mg Tabs (Tramadol Hcl) .... One Tab By Mouth Three Times A Day As Needed  Discontinue Hydrocodone  Allergies (verified): 1)  ! Codeine 2)  ! Hydrocodone  Past History:  Past medical history reviewed for relevance to current acute and chronic problems.  Past Medical History: Reviewed history from 01/12/2010 and no changes required. Current Problems:  ANXIETY (ICD-300.00) DEPRESSION, MILD (ICD-311) HEADACHE (ICD-784.0) duoodenal ulcer, duodenitis and gastritis hospitalised 6/23 to 6 25/2011 because of this h pylori disease treated in  june 2011   Review of Systems General:  Denies chills and fever. GI:  Complains of abdominal pain, loss of appetite, and nausea; denies change in bowel habits, constipation, diarrhea, and vomiting. GU:  Denies dysuria, hematuria, and urinary frequency.  Physical Exam  General:  Well-developed,well-nourished,in no acute distress; alert,appropriate and cooperative throughout examination Head:  Normocephalic and atraumatic without obvious abnormalities. No apparent alopecia or balding. Ears:  External ear exam shows no significant lesions or deformities.  Otoscopic examination reveals clear canals, tympanic membranes are intact bilaterally without bulging, retraction, inflammation or discharge. Hearing is grossly normal bilaterally. Nose:  External nasal examination shows no deformity or inflammation. Nasal mucosa are pink and moist without lesions or exudates. Mouth:  Oral mucosa and oropharynx without lesions or exudates.  Teeth in good repair. Neck:  No deformities, masses, or tenderness noted. Lungs:  Normal respiratory effort, chest expands symmetrically. Lungs are clear to auscultation, no crackles or wheezes. Heart:  Normal rate and regular rhythm. S1 and S2 normal without gallop, murmur, click, rub or other extra sounds. Abdomen:  Bowel sounds positive,abdomen soft and non-tender without masses, organomegaly or hernias noted. Cervical Nodes:  No lymphadenopathy noted Psych:  Cognition and judgment appear intact. Alert and cooperative with normal attention span and concentration. No apparent delusions, illusions, hallucinations  Impression & Recommendations:  Problem # 1:  OVARIAN CYST, RIGHT (ICD-620.2) Assessment Comment Only Will refer pt for GYN consult and f/u due to her anxiety.  Orders: Gynecologic Referral (Gyn)  Complete Medication List: 1)  Restoril 30 Mg Caps (Temazepam) .... Take one by mouth at bedtime prn sleep 2)  Topamax 25 Mg Tabs (Topiramate) .... Two tablets  at bedtime 3)  Alagesic 50-325-40 Mg Caps (Butalbital-apap-caffeine) .... One at headache onset rept after 4 to 6 hrs if headache persits 4)  Vitamin D (ergocalciferol) 50000 Unit Caps (Ergocalciferol) .... One tab by mouth every week 5)  Tramadol Hcl 50 Mg Tabs (Tramadol hcl) .... One tab by mouth three times a day as needed  discontinue hydrocodone  Other Orders: Influenza Vaccine NON MCR ([address])  Patient Instructions: 1)  Please schedule a follow-up appointment in 3 months. 2)  Use Tylenol as needed for pain. 3)  I have referred you to a gynecologist for follow up of your ovarian cyst. 4)  Return if symptoms worsen. Prescriptions: RESTORIL 30 MG CAPS (TEMAZEPAM) take one by mouth at bedtime prn sleep  #30 x 2   Entered by:   Everitt Amber LPN   Authorized by:   Syliva Overman MD   Signed by:   Everitt Amber LPN on 60/45/4098   Method used:   Printed then faxed to ...       [facility] [address] [phone]* (retail)       [address] [place], [place]  [address]       Ph: [phone]       Fax: [phone]   RxID:   [id number]    Influenza Vaccine    Vaccine Type: Fluvax Non-MCR    Site: left deltoid    Mfr: novartis     Dose: 0.5 ml    Route: IM    Given by: Everitt Amber LPN    Exp. Date: 11/2010    Lot #: [id number]

## 2010-08-14 NOTE — Assessment & Plan Note (Signed)
Summary: PHY   Vital Signs:  Patient profile:   50 year old female Menstrual status:  hysterectomy Height:      64 inches Weight:      187.50 pounds BMI:     32.30 O2 Sat:      84 % Pulse rate:   72 / minute Pulse rhythm:   regular Resp:     98 per minute BP sitting:   120 / 80  (left arm) Cuff size:   large  Vitals Entered By: Everitt Amber LPN (October 18, 6642 7:57 AM)  Nutrition Counseling: Patient's BMI is greater than 25 and therefore counseled on weight management options. CC: CPE  Vision Screening:Left eye w/o correction: 20 / 20 Right Eye w/o correction: 20 / 25 Both eyes w/o correction:  20/ 20  Color vision testing: normal      Vision Entered By: Everitt Amber LPN (October 17, 345 7:57 AM)   CC:  CPE.  History of Present Illness: Reports  thatshe has been  doing well. Denies recent fever or chills. Denies sinus pressure, nasal congestion , ear pain or sore throat. Denies chest congestion, or cough productive of sputum. Denies chest pain, palpitations, PND, orthopnea or leg swelling. Denies abdominal pain, nausea, vomitting, diarrhea or constipation.  Denies change in bowel movements or bloody stool. Denies dysuria , frequency, incontinence or hesitancy. Denies  joint pain, swelling, or reduced mobility. Denies headaches, vertigo, seizures. Denies depression, anxiety or insomnia. Denies  rash, lesions, or itch. The pt has started dietary change with succesful weight loss, she intends tio increase her exercise. She denies any recent vertigo, statesit is most often associated with headaches she was recently told by eNt to stick with neurology for now with her vertigo with which i concur, she understand s an agrees,        Allergies: 1)  ! Codeine  Review of Systems      See HPI Eyes:  Denies blurring and discharge. Endo:  Denies cold intolerance, excessive hunger, excessive thirst, excessive urination, heat intolerance, polyuria, and weight  change. Heme:  Denies abnormal bruising. Allergy:  Denies hives or rash and itching eyes.  Physical Exam  General:  Well-developed,well-nourished,in no acute distress; alert,appropriate and cooperative throughout examination Head:  Normocephalic and atraumatic without obvious abnormalities. No apparent alopecia or balding. Eyes:  No corneal or conjunctival inflammation noted. EOMI. Perrla. Funduscopic exam benign, without hemorrhages, exudates or papilledema. Vision grossly normal. Ears:  External ear exam shows no significant lesions or deformities.  Otoscopic examination reveals clear canals, tympanic membranes are intact bilaterally without bulging, retraction, inflammation or discharge. Hearing is grossly normal bilaterally. Nose:  External nasal examination shows no deformity or inflammation. Nasal mucosa are pink and moist without lesions or exudates. Mouth:  Oral mucosa and oropharynx without lesions or exudates.  Teeth in good repair.teeth missing.  teeth missing.   Neck:  No deformities, masses, or tenderness noted. Chest Wall:  No deformities, masses, or tenderness noted. Breasts:  No mass, nodules, thickening, tenderness, bulging, retraction, inflamation, nipple discharge or skin changes noted.   Lungs:  Normal respiratory effort, chest expands symmetrically. Lungs are clear to auscultation, no crackles or wheezes. Heart:  Normal rate and regular rhythm. S1 and S2 normal without gallop, murmur, click, rub or other extra sounds. Abdomen:  Bowel sounds positive,abdomen soft and non-tender without masses, organomegaly or hernias noted. Rectal:  No external abnormalities noted. Normal sphincter tone. No rectal masses or tenderness.guaic negative stool Genitalia:  normal  introitus, no external lesions, and mucosa pink and moist.  normal introitus, no external lesions, and mucosa pink and moist.   Msk:  No deformity or scoliosis noted of thoracic or lumbar spine.   Pulses:  R and L  carotid,radial,femoral,dorsalis pedis and posterior tibial pulses are full and equal bilaterally Extremities:  No clubbing, cyanosis, edema, or deformity noted with normal full range of motion of all joints.   Neurologic:  No cranial nerve deficits noted. Station and gait are normal. Plantar reflexes are down-going bilaterally. DTRs are symmetrical throughout. Sensory, motor and coordinative functions appear intact. Skin:  Intact without suspicious lesions or rashes Cervical Nodes:  No lymphadenopathy noted Axillary Nodes:  No palpable lymphadenopathy Inguinal Nodes:  No significant adenopathy Psych:  Cognition and judgment appear intact. Alert and cooperative with normal attention span and concentration. No apparent delusions, illusions, hallucinations   Impression & Recommendations:  Problem # 1:  INTERMITTENT VERTIGO (ICD-780.4) Assessment Comment Only  Her updated medication list for this problem includes:    Meclizine Hcl 12.5 Mg Tabs (Meclizine hcl) .Marland Kitchen... Take 1 tablet by mouth every 6-8 hours as needed    Promethazine Hcl 25 Mg Tabs (Promethazine hcl) .Marland Kitchen... Take 1 tablet by mouth two times a day as needed pt reports that ENT deferred her to neurology, the amt of meclizine  has been doubled and she will now watch closely to see if the vertigo isindependent  of the migraines  Problem # 2:  OBESITY (ICD-278.00) Assessment: Improved  Ht: 64 (10/17/2009)   Wt: 187.50 (10/17/2009)   BMI: 32.30 (10/17/2009)  Problem # 3:  HEADACHE (ICD-784.0) Assessment: Improved  Her updated medication list for this problem includes:    Ibuprofen 800 Mg Tabs (Ibuprofen) ..... One at headache onset, repeat after 6 hrs if needed for uncontrolled headache    Alagesic 50-325-40 Mg Caps (Butalbital-apap-caffeine) ..... One at headache onset rept after 4 to 6 hrs if headache persits  Her updated medication list for this problem includes:    Ibuprofen 800 Mg Tabs (Ibuprofen) ..... One at headache onset,  repeat after 6 hrs if needed for uncontrolled headache    Alagesic 50-325-40 Mg Caps (Butalbital-apap-caffeine) ..... One at headache onset rept after 4 to 6 hrs if headache persits  Complete Medication List: 1)  Restoril 30 Mg Caps (Temazepam) .... Take one by mouth at bedtime prn sleep 2)  Meclizine Hcl 12.5 Mg Tabs (Meclizine hcl) .... Take 1 tablet by mouth every 6-8 hours as needed 3)  Hydrochlorothiazide 12.5 Mg Tabs (Hydrochlorothiazide) .... Take 1 tablet by mouth once a day 4)  Klor-con 10 10 Meq Cr-tabs (Potassium chloride) .... Take 1 tablet by mouth once a day 5)  Promethazine Hcl 25 Mg Tabs (Promethazine hcl) .... Take 1 tablet by mouth two times a day as needed 6)  Topamax 25 Mg Tabs (Topiramate) .... Two tablets at bedtime 7)  Ibuprofen 800 Mg Tabs (Ibuprofen) .... One at headache onset, repeat after 6 hrs if needed for uncontrolled headache 8)  Alagesic 50-325-40 Mg Caps (Butalbital-apap-caffeine) .... One at headache onset rept after 4 to 6 hrs if headache persits  Other Orders: T-Vitamin D (25-Hydroxy) ([id number]) Hemoccult Guaiac-1 spec.(in office) (82270) Pap Smear (09811)  Patient Instructions: 1)  F/u in 5 months. 2)  It is important that you exercise regularly at least 20 minutes 5 times a week. If you develop chest pain, have severe difficulty breathing, or feel very tired , stop exercising immediately and seek medical attention. 3)  You need to lose weight. Consider a lower calorie diet and regular exercise.   Laboratory Results    Stool - Occult Blood Hemmoccult #1: negative Date: 10/17/2009 Comments: [id number] 9r 8/11 118 10 12

## 2010-08-14 NOTE — Letter (Signed)
Summary: Letter  Letter   Imported By: Lind Guest 03/12/2010 15:43:31  _____________________________________________________________________  External Attachment:    Type:   Image     Comment:   External Document

## 2010-08-14 NOTE — Consult Note (Signed)
Summary: duodenitis  duodenitis   Imported By: Lind Guest 01/09/2010 13:06:46  _____________________________________________________________________  External Attachment:    Type:   Image     Comment:   External Document

## 2010-08-14 NOTE — Op Note (Signed)
Summary: esophagogastroduodenoscopy  esophagogastroduodenoscopy   Imported By: Lind Guest 01/09/2010 13:07:37  _____________________________________________________________________  External Attachment:    Type:   Image     Comment:   External Document

## 2010-08-14 NOTE — Medication Information (Signed)
Summary: Tax adviser   Imported By: Lind Guest 01/12/2010 14:42:34  _____________________________________________________________________  External Attachment:    Type:   Image     Comment:   External Document

## 2010-08-14 NOTE — Letter (Signed)
Summary: CONSULTS  CONSULTS   Imported By: Lind Guest 11/27/2009 14:28:50  _____________________________________________________________________  External Attachment:    Type:   Image     Comment:   External Document

## 2010-08-14 NOTE — Letter (Signed)
Summary: CONSULTATION  CONSULTATION   Imported By: Rexene Alberts 03/01/2010 10:25:31  _____________________________________________________________________  External Attachment:    Type:   Image     Comment:   External Document

## 2010-08-14 NOTE — Progress Notes (Signed)
Summary: medicines  Phone Note Call from Patient   Summary of Call: needs her tramodol , topamax and her headache medicine called into [facility] Initial call taken by: Lind Guest,  January 17, 2010 9:15 AM  Follow-up for Phone Call        pls advise pt to call dr Ronal Fear office for these since he is prescribing all of them Follow-up by: Syliva Overman MD,  January 17, 2010 9:17 AM  Additional Follow-up for Phone Call Additional follow up Details #1::        Left msg on machine for patient to call me back. Additional Follow-up by: Curtis Sites,  January 17, 2010 9:22 AM    Additional Follow-up for Phone Call Additional follow up Details #2::    called patient back and she is aware, she will call Dr. Gerilyn Pilgrim. Follow-up by: Curtis Sites,  January 18, 2010 2:28 PM

## 2010-08-14 NOTE — Letter (Signed)
Summary: PHONE NOTES  PHONE NOTES   Imported By: Lind Guest 11/27/2009 14:30:16  _____________________________________________________________________  External Attachment:    Type:   Image     Comment:   External Document

## 2010-08-14 NOTE — Letter (Signed)
Summary: labs  labs   Imported By: Lind Guest 11/27/2009 14:25:20  _____________________________________________________________________  External Attachment:    Type:   Image     Comment:   External Document

## 2010-08-14 NOTE — Letter (Signed)
Summary: office notes  office notes   Imported By: Lind Guest 11/27/2009 14:24:53  _____________________________________________________________________  External Attachment:    Type:   Image     Comment:   External Document

## 2010-08-14 NOTE — Letter (Signed)
Summary: Letter  Letter   Imported By: Lind Guest 11/17/2009 14:03:23  _____________________________________________________________________  External Attachment:    Type:   Image     Comment:   External Document

## 2010-08-14 NOTE — Letter (Signed)
Summary: MISC  MISC   Imported By: Lind Guest 11/27/2009 14:32:48  _____________________________________________________________________  External Attachment:    Type:   Image     Comment:   External Document

## 2010-08-14 NOTE — Letter (Signed)
Summary: Work Excuse  [facility]  [address]   [place], [place] [address]   Phone: [phone]  Fax: [phone]    Today's Date: March 29, 2010  Name of Patient: Latoya Decker  The above named patient had a medical visit today at:  1:15 pm.  Please take this into consideration when reviewing the time away from work/school.    Special Instructions:  [  ] None  [*  ] To be off the remainder of today, returning to the normal work / school schedule tomorrow.  [  ] To be off until the next scheduled appointment on ______________________.  [  ] Other ________________________________________________________________ ________________________________________________________________________   Sincerely yours,   Esperanza Sheets PA

## 2010-08-16 NOTE — Letter (Signed)
Summary: 1st missed letter  1st missed letter   Imported By: Lind Guest 07/06/2010 11:27:59  _____________________________________________________________________  External Attachment:    Type:   Image     Comment:   External Document

## 2010-08-28 ENCOUNTER — Other Ambulatory Visit: Payer: Self-pay | Admitting: Family Medicine

## 2010-08-28 DIAGNOSIS — Z139 Encounter for screening, unspecified: Secondary | ICD-10-CM

## 2010-09-03 ENCOUNTER — Ambulatory Visit (HOSPITAL_COMMUNITY): Payer: BC Managed Care – PPO

## 2010-09-27 LAB — DIFFERENTIAL
Basophils Relative: 1 % (ref 0–1)
Eosinophils Absolute: 0.1 10*3/uL (ref 0.0–0.7)
Eosinophils Relative: 1 % (ref 0–5)
Lymphocytes Relative: 29 % (ref 12–46)
Monocytes Relative: 8 % (ref 3–12)
Neutrophils Relative %: 61 % (ref 43–77)

## 2010-09-27 LAB — URINALYSIS, ROUTINE W REFLEX MICROSCOPIC
Glucose, UA: NEGATIVE mg/dL
Leukocytes, UA: NEGATIVE
Specific Gravity, Urine: >1.03 — ABNORMAL HIGH (ref 1.005–1.030)
pH: 5.5 (ref 5.0–8.0)

## 2010-09-27 LAB — COMPREHENSIVE METABOLIC PANEL
AST: 16 U/L (ref 0–37)
CO2: 23 mEq/L (ref 19–32)
Creatinine, Ser: 0.92 mg/dL (ref 0.4–1.2)
GFR calc Af Amer: >60 mL/min (ref >60)
Glucose, Bld: 90 mg/dL (ref 70–99)
Potassium: 3.6 mEq/L (ref 3.5–5.1)
Total Bilirubin: 0.6 mg/dL (ref 0.3–1.2)

## 2010-09-27 LAB — CBC
HCT: 38.9 % (ref 36.0–46.0)
MCHC: 33.6 g/dL (ref 30.0–36.0)
MCV: 89.9 fL (ref 78.0–100.0)
Platelets: 245 10*3/uL (ref 150–400)
RBC: 4.32 MIL/uL (ref 3.87–5.11)
RDW: 13 % (ref 11.5–15.5)

## 2010-09-27 LAB — URINE MICROSCOPIC-ADD ON

## 2010-09-27 LAB — LIPASE, BLOOD: Lipase: 32 U/L (ref 11–59)

## 2010-09-30 LAB — CBC
Hemoglobin: 12.2 g/dL (ref 12.0–15.0)
Hemoglobin: 12.9 g/dL (ref 12.0–15.0)
MCHC: 33.6 g/dL (ref 30.0–36.0)
MCHC: 34 g/dL (ref 30.0–36.0)
Platelets: 185 10*3/uL (ref 150–400)
RBC: 4 MIL/uL (ref 3.87–5.11)
RDW: 12.9 % (ref 11.5–15.5)

## 2010-09-30 LAB — COMPREHENSIVE METABOLIC PANEL
ALT: 11 U/L (ref 0–35)
ALT: 12 U/L (ref 0–35)
AST: 15 U/L (ref 0–37)
AST: 16 U/L (ref 0–37)
Albumin: 3.2 g/dL — ABNORMAL LOW (ref 3.5–5.2)
Albumin: 3.9 g/dL (ref 3.5–5.2)
CO2: 26 mEq/L (ref 19–32)
Calcium: 8.4 mg/dL (ref 8.4–10.5)
Calcium: 9.2 mg/dL (ref 8.4–10.5)
Chloride: 110 mEq/L (ref 96–112)
GFR calc Af Amer: >60 mL/min (ref >60)
GFR calc Af Amer: >60 mL/min (ref >60)
GFR calc non Af Amer: >60 mL/min (ref >60)
Sodium: 140 mEq/L (ref 135–145)
Sodium: 141 mEq/L (ref 135–145)
Total Protein: 6.9 g/dL (ref 6.0–8.3)

## 2010-09-30 LAB — DIFFERENTIAL
Eosinophils Absolute: 0.1 10*3/uL (ref 0.0–0.7)
Eosinophils Absolute: 0.1 10*3/uL (ref 0.0–0.7)
Eosinophils Relative: 1 % (ref 0–5)
Eosinophils Relative: 3 % (ref 0–5)
Lymphs Abs: 1.9 10*3/uL (ref 0.7–4.0)
Lymphs Abs: 3.2 10*3/uL (ref 0.7–4.0)
Monocytes Absolute: 0.5 10*3/uL (ref 0.1–1.0)
Monocytes Relative: 8 % (ref 3–12)

## 2010-09-30 LAB — MRSA PCR SCREENING: MRSA by PCR: NEGATIVE

## 2010-09-30 LAB — URINALYSIS, ROUTINE W REFLEX MICROSCOPIC
Ketones, ur: NEGATIVE mg/dL
Nitrite: NEGATIVE
Protein, ur: NEGATIVE mg/dL
pH: 5 (ref 5.0–8.0)

## 2010-09-30 LAB — POCT CARDIAC MARKERS: Troponin i, poc: <0.05 ng/mL (ref 0.00–0.09)

## 2010-11-01 ENCOUNTER — Ambulatory Visit (HOSPITAL_COMMUNITY)
Admission: RE | Admit: 2010-11-01 | Discharge: 2010-11-01 | Disposition: A | Discharge status: Home / Self Care | Payer: BC Managed Care – PPO | Source: Ambulatory Visit | Attending: Family Medicine | Admitting: Family Medicine

## 2010-11-01 DIAGNOSIS — Z1231 Encounter for screening mammogram for malignant neoplasm of breast: Secondary | ICD-10-CM | POA: Insufficient documentation

## 2010-11-01 DIAGNOSIS — Z139 Encounter for screening, unspecified: Secondary | ICD-10-CM

## 2010-11-02 ENCOUNTER — Other Ambulatory Visit: Payer: Self-pay | Admitting: Family Medicine

## 2010-11-02 DIAGNOSIS — R928 Other abnormal and inconclusive findings on diagnostic imaging of breast: Secondary | ICD-10-CM

## 2010-11-05 ENCOUNTER — Encounter: Payer: Self-pay | Admitting: Family Medicine

## 2010-11-05 ENCOUNTER — Other Ambulatory Visit: Payer: Self-pay | Admitting: Family Medicine

## 2010-11-05 DIAGNOSIS — R928 Other abnormal and inconclusive findings on diagnostic imaging of breast: Secondary | ICD-10-CM

## 2010-11-06 ENCOUNTER — Ambulatory Visit (INDEPENDENT_AMBULATORY_CARE_PROVIDER_SITE_OTHER): Payer: BC Managed Care – PPO | Admitting: Family Medicine

## 2010-11-06 ENCOUNTER — Ambulatory Visit
Admission: RE | Admit: 2010-11-06 | Discharge: 2010-11-06 | Disposition: A | Discharge status: Home / Self Care | Payer: BC Managed Care – PPO | Source: Ambulatory Visit | Attending: Family Medicine | Admitting: Family Medicine

## 2010-11-06 ENCOUNTER — Encounter: Payer: Self-pay | Admitting: Family Medicine

## 2010-11-06 ENCOUNTER — Telehealth: Payer: Self-pay | Admitting: Family Medicine

## 2010-11-06 VITALS — BP 150/80 | HR 82 | Resp 16 | Ht 65.25 in | Wt 197.0 lb

## 2010-11-06 DIAGNOSIS — R928 Other abnormal and inconclusive findings on diagnostic imaging of breast: Secondary | ICD-10-CM

## 2010-11-06 DIAGNOSIS — R7301 Impaired fasting glucose: Secondary | ICD-10-CM

## 2010-11-06 DIAGNOSIS — G4700 Insomnia, unspecified: Secondary | ICD-10-CM

## 2010-11-06 DIAGNOSIS — E785 Hyperlipidemia, unspecified: Secondary | ICD-10-CM

## 2010-11-06 DIAGNOSIS — R5383 Other fatigue: Secondary | ICD-10-CM

## 2010-11-06 DIAGNOSIS — E669 Obesity, unspecified: Secondary | ICD-10-CM

## 2010-11-06 DIAGNOSIS — R5381 Other malaise: Secondary | ICD-10-CM

## 2010-11-06 DIAGNOSIS — I10 Essential (primary) hypertension: Secondary | ICD-10-CM

## 2010-11-06 MED ORDER — TRIAMTERENE-HCTZ 37.5-25 MG PO TABS: 1.0000 | ORAL_TABLET | Freq: Every day | ORAL | Status: DC

## 2010-11-06 MED ORDER — TOPIRAMATE 25 MG PO CPSP: 25.0000 mg | ORAL_CAPSULE | Freq: Two times a day (BID) | ORAL | Status: DC

## 2010-11-06 MED ORDER — ERGOCALCIFEROL 1.25 MG (50000 UT) PO CAPS: 50000.0000 [IU] | ORAL_CAPSULE | ORAL | Status: DC

## 2010-11-06 MED ORDER — TEMAZEPAM 30 MG PO CAPS: 30.0000 mg | ORAL_CAPSULE | Freq: Every evening | ORAL | Status: DC | PRN

## 2010-11-06 MED ORDER — BUTALBITAL-APAP-CAFFEINE 50-325-40 MG PO TABS: ORAL_TABLET | ORAL | Status: DC

## 2010-11-06 NOTE — Progress Notes (Signed)
  Subjective:    Patient ID: Latoya Decker, female    DOB: 01-Jul-1961, 50 y.o.   MRN: [id number]  HPI Pt is exhausted, excessive work , from 3am to 5pm three days per week, now going mandatory overtime Needs help with sleep med. She denies depression or anxiety. She is not exercising regularly. Neither is she following a healthy diet, she is gaining weight and this is distressing to her, she knows this is unhealthy.    Review of Systems Denies recent fever or chills. Denies sinus pressure, nasal congestion, ear pain or sore throat. Denies chest congestion, productive cough or wheezing. Denies chest pains, palpitations, paroxysmal nocturnal dyspnea, orthopnea and leg swelling Denies abdominal pain, nausea, vomiting,diarrhea or constipation.  Denies rectal bleeding or change in bowel movement. Denies dysuria, frequency, hesitancy or incontinence. Denies joint pain, swelling and limitation in mobility. Denies headaches, seizure, numbness, or tingling.  Denies skin break down or rash.        Objective:   Physical Exam Patient alert and oriented and in no Cardiopulmonary distress.  HEENT: No facial asymmetry, EOMI, no sinus tenderness, TM's clear, Oropharynx pink and moist.  Neck supple no adenopathy.  Chest: Clear to auscultation bilaterally.  CVS: S1, S2 no murmurs, no S3.  ABD: Soft non tender. Bowel sounds normal.  Ext: No edema  MS: Adequate ROM spine, shoulders, hips and knees.  Skin: Intact, no ulcerations or rash noted.  Psych: Good eye contact, normal affect. Memory intact not anxious or depressed appearing.  CNS: CN 2-12 intact, power, tone and sensation normal throughout.        Assessment & Plan:

## 2010-11-06 NOTE — Patient Instructions (Signed)
F/u in 2 months.  It is important that you exercise regularly at least 30 minutes 5 times a week. If you develop chest pain, have severe difficulty breathing, or feel very tired, stop exercising immediately and seek medical attention  A healthy diet is rich in fruit, vegetables and whole grains. Poultry fish, nuts and beans are a healthy choice for protein rather then red meat. A low sodium diet and drinking 64 ounces of water daily is generally recommended. Oils and sweet should be limited. Carbohydrates especially for those who are diabetic or overweight, should be limited to 34-45 gram per meal. It is important to eat on a regular schedule, at least 3 times daily. Snacks should be primarily fruits, vegetables or nuts. You will be starting med for high blood pressure. You will get a dASH diet and a 1500 cal diet sheet. Goal weight loss is 5 pounds at least.  All the best with your test, call if needed.  cBC , fasting chem 7 lipid , hepatic , tSH  And hBA1c in 2 months

## 2010-11-06 NOTE — Progress Notes (Signed)
Patient here for OV. Will make aware

## 2010-11-06 NOTE — Telephone Encounter (Signed)
Noted  

## 2010-11-07 ENCOUNTER — Other Ambulatory Visit: Payer: Self-pay | Admitting: Family Medicine

## 2010-11-07 ENCOUNTER — Ambulatory Visit: Payer: BC Managed Care – PPO | Admitting: Family Medicine

## 2010-11-07 DIAGNOSIS — N6459 Other signs and symptoms in breast: Secondary | ICD-10-CM

## 2010-11-18 ENCOUNTER — Encounter: Payer: Self-pay | Admitting: Family Medicine

## 2010-11-18 DIAGNOSIS — G4700 Insomnia, unspecified: Secondary | ICD-10-CM | POA: Insufficient documentation

## 2010-11-18 DIAGNOSIS — I10 Essential (primary) hypertension: Secondary | ICD-10-CM | POA: Insufficient documentation

## 2010-11-18 NOTE — Assessment & Plan Note (Signed)
Deteriorated. Patient re-educated about  the importance of commitment to a  minimum of 150 minutes of exercise per week. The importance of healthy food choices with portion control discussed. Encouraged to start a food diary, count calories and to consider  joining a support group. Sample diet sheets offered. Goals set by the patient for the next several months.    

## 2010-11-18 NOTE — Assessment & Plan Note (Signed)
Good sleep hygiene discussed, and pt counseled on this. Med prescribed also

## 2010-11-18 NOTE — Assessment & Plan Note (Signed)
New dx, pt to start medication. DASH diet discussed and provided, she is too start regular exercise.weight loss goal also esablished

## 2010-11-30 NOTE — Op Note (Signed)
NAMEEVANGELIA, WHITAKER              ACCOUNT NO.:  [id number]   MEDICAL RECORD NO.:  [id number]          PATIENT TYPE:  OIB   LOCATION:  9311                          FACILITY:  [facility]   PHYSICIAN:  Maxie Better, M.D.DATE OF BIRTH:  May 25, 1961   DATE OF PROCEDURE:  04/23/2006  DATE OF DISCHARGE:                                 OPERATIVE REPORT   PREOPERATIVE DIAGNOSES:  Menorrhagia and dysmenorrhea.   POSTOPERATIVE DIAGNOSES:  Menorrhagia and dysmenorrhea.   PROCEDURE:  Laparoscopic-assisted vaginal hysterectomy.   ANESTHESIA:  General.   SURGEON:  Maxie Better, M.D.   ASSISTANT:  Cordelia Pen A. Dickstein, M.D.   DESCRIPTION OF PROCEDURE:  Under adequate general anesthesia, the patient  was placed in the dorsal lithotomy position. Examination under anesthesia  revealed a bulky anteverted uterus, no adnexal masses could be appreciated.  The patient was sterilely prepped and draped in the usual fashion for a  laparoscopic vaginal hysterectomy procedure.  An indwelling Foley catheter  was sterilely placed.  The vagina which had been prepped had a bivalve  speculum placed. A single-tooth tenaculum was placed on the anterior lip of  the cervix.  An acorn cannula was introduced into the cervical os and  attached to the tenaculum for manipulation of the uterus.  The bivalve  speculum was then removed.  Attention was then turned to the abdomen where a  previous vertical infraumbilical incision was noted.  A 0.25% Marcaine was  injected in that same site and an incision was then made.  The Veress needle  was introduced and tested without incident.  Carbon dioxide was insufflated  with an opening pressure of 9 noted and 3 liters of CO2 insufflated.  The  Veress needle was then removed, a disposable 10 mm trocar was introduced  into the abdomen without incident.  A lighted video laparoscope was put  through that port.  Two lower ports were put under direct visualization in  the lower  abdomen. Through those lower ports, the pelvis was inspected,  uterus appeared to be globular. The tubes showed prior evidence of tubal  ligation.  Both ovaries were normal.  The appendix was not visualized and a  normal liver edge was noted. Through the lower ports, the round ligaments  were bilaterally grasped, clamped with the gyrus instrument, cauterized  and  cut. The utero-ovarian ligaments were then bilaterally clamped, cauterized  and cut. Both ureters had been seen peristalsing deep in the pelvis.  The  vesicouterine peritoneum was opened anteriorly and the bladder displaced  inferiorly. The posterior and anterior cul-de-sacs showed no evidence of  endometriosis. The pelvis was irrigated. Good hemostasis noted.  The ports  were left in the abdomen and the instruments removed from those ports. The  attention was then turned to the pelvis/vagina.  The tenaculum and the acorn  cannula was removed, a weighted speculum was then utilized and the Sims  retractor was used anteriorly.  The cervix which was parous was grasped with  a Jacobson clamp anteriorly and posteriorly. A dilute solution of Pitressin  was injected in the cervix circumferentially at the cervicovaginal junction.  A circumferential incision was made at that junction.  The posterior cul-de-  sac was then opened, #0 Vicryl was then used to reef the vaginal cuff  posteriorly. The uterosacral was then bilaterally clamped, cut and suture  ligated with #0 Vicryl suture.  The LigaSure was then utilized to  subsequently clamp, cauterize and ultimately cut and severing of the  cardinal ligaments bilaterally.  The uterine vessels also clamped,  cauterized and cut. This was then continued until all the attachment to the  uterus was then severed. The uterus was then removed. The right lateral wall  at the vagina, the vaginal cuff edge was bleeding and two figure-of-eight  suture was utilized for good hemostasis.  The anterior  cul-de-sac had been  opened and the bladder displaced superiorly.  The uterus with the cervix was  removed, the vagina was then closed with interrupted #0 Vicryl figure-of-  eight sutures in a vertical fashion.  The uterosacral was tied in midline.  All instruments in the vagina was then removed. The attention was then  turned back to the abdomen which was reinsufflated. All instruments which  had been remained was then reutilized. The light of the laparoscope was then  reinserted. The pelvis was inspected, the ovarian pedicles had good  hemostasis, small bleeders were cauterized in the pelvis. The abdomen was  once again irrigated, suctioned. When good hemostasis was then noted, the  abdomen was deflated, lower ports removed, the infraumbilical port was  brought up taking care not to bring up any underlying structures. The  incision, the one on the right was closed with Dermabond, the one on the  left was hemostased with 4-0 Vicryl figure-of-eight sutures and the  procedure was felt to be complete. The specimen was uterus with cervix sent  to pathology. Intraoperative fluid was 2300 mL crystalloid. Urine output was  900 mL clear yellow urine. Estimated blood loss was 200 mL. Complications  was none. The patient tolerated the procedure well and was transferred to  the recovery room in stable condition.      Maxie Better, M.D.  Electronically Signed     [place]/[facility]  D:  04/23/2006  T:  04/25/2006  Job:  [id number]

## 2010-12-05 ENCOUNTER — Encounter: Payer: Self-pay | Admitting: Family Medicine

## 2010-12-06 ENCOUNTER — Encounter: Payer: Self-pay | Admitting: Family Medicine

## 2010-12-06 ENCOUNTER — Emergency Department (HOSPITAL_COMMUNITY)
Admission: EM | Admit: 2010-12-06 | Discharge: 2010-12-06 | Disposition: A | Discharge status: Home / Self Care | Payer: BC Managed Care – PPO | Attending: Emergency Medicine | Admitting: Emergency Medicine

## 2010-12-06 ENCOUNTER — Encounter (HOSPITAL_COMMUNITY): Payer: Self-pay | Admitting: Radiology

## 2010-12-06 ENCOUNTER — Emergency Department (HOSPITAL_COMMUNITY): Payer: BC Managed Care – PPO

## 2010-12-06 ENCOUNTER — Ambulatory Visit (INDEPENDENT_AMBULATORY_CARE_PROVIDER_SITE_OTHER): Payer: BC Managed Care – PPO | Admitting: Family Medicine

## 2010-12-06 VITALS — BP 130/80 | HR 87 | Resp 16 | Ht 65.0 in | Wt 190.1 lb

## 2010-12-06 DIAGNOSIS — R109 Unspecified abdominal pain: Secondary | ICD-10-CM | POA: Insufficient documentation

## 2010-12-06 DIAGNOSIS — R1031 Right lower quadrant pain: Secondary | ICD-10-CM

## 2010-12-06 DIAGNOSIS — G43909 Migraine, unspecified, not intractable, without status migrainosus: Secondary | ICD-10-CM | POA: Insufficient documentation

## 2010-12-06 LAB — DIFFERENTIAL
Eosinophils Relative: 3 % (ref 0–5)
Lymphocytes Relative: 31 % (ref 12–46)
Lymphs Abs: 2.2 10*3/uL (ref 0.7–4.0)
Monocytes Absolute: 0.5 10*3/uL (ref 0.1–1.0)

## 2010-12-06 LAB — URINALYSIS, ROUTINE W REFLEX MICROSCOPIC
Glucose, UA: NEGATIVE mg/dL
Ketones, ur: NEGATIVE mg/dL
Leukocytes, UA: NEGATIVE
pH: 5 (ref 5.0–8.0)

## 2010-12-06 LAB — CBC
HCT: 39.2 % (ref 36.0–46.0)
MCHC: 33.2 g/dL (ref 30.0–36.0)
MCV: 89.7 fL (ref 78.0–100.0)
WBC: 7.2 10*3/uL (ref 4.0–10.5)

## 2010-12-06 LAB — URINE MICROSCOPIC-ADD ON

## 2010-12-06 LAB — COMPREHENSIVE METABOLIC PANEL
Albumin: 3.9 g/dL (ref 3.5–5.2)
BUN: 18 mg/dL (ref 6–23)
Creatinine, Ser: 1.03 mg/dL (ref 0.4–1.2)
Total Protein: 7.5 g/dL (ref 6.0–8.3)

## 2010-12-06 MED ORDER — IOHEXOL 300 MG/ML  SOLN
100.0000 mL | Freq: Once | INTRAMUSCULAR | Status: AC | PRN
  Administered 2010-12-06: 100 mL via INTRAVENOUS

## 2010-12-06 NOTE — Patient Instructions (Signed)
Keep appt  As before.  You need to go to the Ed for further evaluation of abdominal  pain, I will speak with the Doc  Your BP is good.  I hope you feelbetter soon

## 2010-12-06 NOTE — Progress Notes (Signed)
  Subjective:    Patient ID: Latoya Decker, female    DOB: 02-24-1961, 50 y.o.   MRN: [id number]  HPI 1 wweek h/o progressive lower abdominal pain, now at a 10, no nausea or vomittng, diarreagh the weekend, no constipation or blood in her stool. Hospitalised 12/2009 for 3 days due to acute diverticulitis, still has  her appendix, denies fever , chill;s, or urinary symptoms Prior to this she had been well.   Review of Systems Denies recent fever or chills. Denies sinus pressure, nasal congestion, ear pain or sore throat. Denies chest congestion, productive cough or wheezing. Denies chest pains, palpitations, paroxysmal nocturnal dyspnea, orthopnea and leg swelling  Denies joint pain, swelling and limitation in mobility. Denies headaches, seizure, numbness, or tingling. Denies depression,does have  anxietysurrounding the illness, has also had  insomniadue to pain. Denies skin break down or rash.        Objective:   Physical Exam Patient alert and oriented and in no Cardiopulmonary distress.Pt in obvious pain  HEENT: No facial asymmetry, EOMI, no sinus tenderness,  Oropharynx pink and moist.  Neck supple no adenopathy.  Chest: Clear to auscultation bilaterally.  CVS: S1, S2 no murmurs, no S3.  ABD: diffuse tenderness wit uarding and ebound in RLQ  Ext: No edema  MS: Adequate ROM spine, shoulders, hips and knees.  Skin: Intact, no ulcerations or rash noted.  Psych: Good eye contact, normal affect. Memory intact  anxious and tearful  CNS: CN 2-12 intact, power, tone and sensation normal throughout.     Assessment & Plan:

## 2010-12-07 ENCOUNTER — Telehealth: Payer: Self-pay | Admitting: Family Medicine

## 2010-12-07 ENCOUNTER — Encounter: Payer: Self-pay | Admitting: *Deleted

## 2010-12-07 NOTE — Telephone Encounter (Signed)
pls type work excuse for 5/25 and 5/26, she is still in pain, her spouse is to collect this evening , I will sign

## 2010-12-07 NOTE — Telephone Encounter (Signed)
She states that they ran a CT scan on her yesterday and blood work and they told her everything looked good other then the swelling in one area.  She is still hurting and they gave her pain medication.  She doesn't want to go back to the ER, she had a headache over the night and felt nausea she said that both of these symptoms are better.  Please call patient.

## 2010-12-07 NOTE — Telephone Encounter (Signed)
Work note picked up today 

## 2010-12-11 NOTE — Assessment & Plan Note (Signed)
Severe, pt sent to the ED for further evaluation

## 2011-01-01 ENCOUNTER — Encounter: Payer: Self-pay | Admitting: Family Medicine

## 2011-01-07 ENCOUNTER — Encounter: Payer: Self-pay | Admitting: Family Medicine

## 2011-01-08 ENCOUNTER — Encounter: Payer: Self-pay | Admitting: Family Medicine

## 2011-01-08 ENCOUNTER — Ambulatory Visit (INDEPENDENT_AMBULATORY_CARE_PROVIDER_SITE_OTHER): Payer: BC Managed Care – PPO | Admitting: Family Medicine

## 2011-01-08 VITALS — BP 120/82 | HR 71 | Resp 16 | Ht 65.5 in | Wt 192.4 lb

## 2011-01-08 DIAGNOSIS — G4700 Insomnia, unspecified: Secondary | ICD-10-CM

## 2011-01-08 DIAGNOSIS — F411 Generalized anxiety disorder: Secondary | ICD-10-CM

## 2011-01-08 DIAGNOSIS — F439 Reaction to severe stress, unspecified: Secondary | ICD-10-CM

## 2011-01-08 DIAGNOSIS — Z13820 Encounter for screening for osteoporosis: Secondary | ICD-10-CM

## 2011-01-08 DIAGNOSIS — R1031 Right lower quadrant pain: Secondary | ICD-10-CM

## 2011-01-08 DIAGNOSIS — I10 Essential (primary) hypertension: Secondary | ICD-10-CM

## 2011-01-08 DIAGNOSIS — Z733 Stress, not elsewhere classified: Secondary | ICD-10-CM

## 2011-01-08 DIAGNOSIS — E669 Obesity, unspecified: Secondary | ICD-10-CM

## 2011-01-08 NOTE — Assessment & Plan Note (Signed)
Willing to go for therapy

## 2011-01-08 NOTE — Patient Instructions (Signed)
F/u in 4 months.  Vit d , hBA1C , fasting chem 7  In 4 months  It is important that you exercise regularly at least 30 minutes 5 times a week. If you develop chest pain, have severe difficulty breathing, or feel very tired, stop exercising immediately and seek medical attention    A healthy diet is rich in fruit, vegetables and whole grains. Poultry fish, nuts and beans are a healthy choice for protein rather then red meat. A low sodium diet and drinking 64 ounces of water daily is generally recommended. Oils and sweet should be limited. Carbohydrates especially for those who are diabetic or overweight, should be limited to 34-45 gram per meal. It is important to eat on a regular schedule, at least 3 times daily. Snacks should be primarily fruits, vegetables or nuts. Goal of 8 to 10 pounds.  You will be referred for therapy

## 2011-01-13 NOTE — Assessment & Plan Note (Signed)
Increased and uncontrolled, primamirily due to stress on the job, will refer to therapy

## 2011-01-13 NOTE — Assessment & Plan Note (Signed)
Controlled, no change in medication  

## 2011-01-13 NOTE — Assessment & Plan Note (Signed)
Improved, an underlying anxiety issue plays a role in her symptoms

## 2011-01-13 NOTE — Progress Notes (Signed)
  Subjective:    Patient ID: Latoya Decker, female    DOB: [birth year], 50 y.o.   MRN: [id number]  HPI The PT is here for follow up and re-evaluation of chronic medical conditions, medication management and review of recent lab and radiology data.  Preventive health is updated, specifically  Cancer screening,  and Immunization.   Questions or concerns regarding consultations or procedures which the PT has had in the interim are  addressed. States she is under excessive stress on the job, which affects her sleep, and may well contribute to the severe abdominal pains she experiences from time to time    Review of Systems Denies recent fever or chills. Denies sinus pressure, nasal congestion, ear pain or sore throat. Denies chest congestion, productive cough or wheezing. Denies chest pains, palpitations, paroxysmal nocturnal dyspnea, orthopnea and leg swelling Denies abdominal pain, nausea, vomiting,diarrhea or constipation. . Denies dysuria, frequency, hesitancy or incontinence. Denies joint pain, swelling and limitation in mobility. Denies headaches, seizure, numbness, or tingling.  Denies skin break down or rash.        Objective:   Physical Exam Patient alert and oriented and in no Cardiopulmonary distress.  HEENT: No facial asymmetry, EOMI, no sinus tenderness, TM's clear, Oropharynx pink and moist.  Neck supple no adenopathy.  Chest: Clear to auscultation bilaterally.  CVS: S1, S2 no murmurs, no S3.  ABD: Soft non tender. Bowel sounds normal.  Ext: No edema  MS: Adequate ROM spine, shoulders, hips and knees.  Skin: Intact, no ulcerations or rash noted.  Psych: Good eye contact, normal affect. Memory intact anxious but not  depressed appearing.  CNS: CN 2-12 intact, power, tone and sensation normal throughout.        Assessment & Plan:

## 2011-01-13 NOTE — Assessment & Plan Note (Signed)
Deteriorated, sleep hygiene discussed, pt to be started on medication

## 2011-01-23 ENCOUNTER — Ambulatory Visit (INDEPENDENT_AMBULATORY_CARE_PROVIDER_SITE_OTHER): Payer: BC Managed Care – PPO | Admitting: Psychiatry

## 2011-01-23 DIAGNOSIS — F4323 Adjustment disorder with mixed anxiety and depressed mood: Secondary | ICD-10-CM

## 2011-02-01 ENCOUNTER — Encounter (INDEPENDENT_AMBULATORY_CARE_PROVIDER_SITE_OTHER): Payer: BC Managed Care – PPO | Admitting: Psychiatry

## 2011-02-01 DIAGNOSIS — F4323 Adjustment disorder with mixed anxiety and depressed mood: Secondary | ICD-10-CM

## 2011-02-15 ENCOUNTER — Emergency Department (HOSPITAL_COMMUNITY)
Admission: EM | Admit: 2011-02-15 | Discharge: 2011-02-15 | Disposition: A | Discharge status: Home / Self Care | Payer: BC Managed Care – PPO | Attending: Emergency Medicine | Admitting: Emergency Medicine

## 2011-02-15 ENCOUNTER — Encounter (HOSPITAL_COMMUNITY): Payer: Self-pay | Admitting: *Deleted

## 2011-02-15 DIAGNOSIS — L239 Allergic contact dermatitis, unspecified cause: Secondary | ICD-10-CM

## 2011-02-15 DIAGNOSIS — L259 Unspecified contact dermatitis, unspecified cause: Secondary | ICD-10-CM | POA: Insufficient documentation

## 2011-02-15 MED ORDER — PREDNISONE 10 MG PO TABS: 20.0000 mg | ORAL_TABLET | Freq: Every day | ORAL | Status: AC

## 2011-02-15 NOTE — ED Notes (Signed)
Pt reports she noticed a rash on her face starting 5 days ago, reports now rash is everywhere

## 2011-02-15 NOTE — ED Provider Notes (Signed)
History   Chart scribed for Flint Melter, MD by Enos Fling; the patient was seen in room APA19/APA19; this patient's care was started at 7:23 AM.    CSN: [id number] Arrival date & time: 02/15/2011  6:45 AM  Chief Complaint  Patient presents with  . Rash   HPI Latoya Decker is a 50 y.o. female who presents to the Emergency Department complaining of rash. Pt reports using an old hair spray on Saturday and later that day noticed the rash over her forehead that spread throughout her face. Pt c/o facial pain, mild swelling, red facial rash, and ha that have been persistent since then. No trouble eating, swallowing, or breathing. No fever, chest tightness, or congestion. No other new exposures. Pt has taken 2 tablets of adult Benadryl every morning for the past 5 days and used benadryl cream on her face with no relief, last pill taken yesterday morning. No h/o allergic reactions. No other complaints.    PAST MEDICAL HISTORY:  Past Medical History  Diagnosis Date  . Anxiety   . Depression   . Headache   . Duodenal ulcer   . Duodenitis   . Gastritis     hospitalised 6/23-6/25 2011   . H. pylori duodenitis     PAST SURGICAL HISTORY:  Past Surgical History  Procedure Date  . Cholecystectomy 1997  . Bilateral tubal ligation 1989  . Partial hysterectomy 2007     MEDICATIONS:  Previous Medications   BUTALBITAL-ACETAMINOPHEN-CAFFEINE (FIORICET) 50-325-40 MG PER TABLET    Take one tab by mouth at onset of headache and repeat in 4-6 hrs if headache persists   ERGOCALCIFEROL (VITAMIN D2) 50000 UNITS CAPSULE    Take 1 capsule (50,000 Units total) by mouth once a week.   TEMAZEPAM (RESTORIL) 30 MG CAPSULE    Take 1 capsule (30 mg total) by mouth at bedtime as needed.   TOPIRAMATE (TOPAMAX) 25 MG CAPSULE    Take 1 capsule (25 mg total) by mouth 2 (two) times daily. Take 2 tablets by mouth at bedtime   TRAMADOL (ULTRAM) 50 MG TABLET       TRIAMTERENE-HYDROCHLOROTHIAZIDE (MAXZIDE-25)  37.5-25 MG PER TABLET    Take 1 tablet by mouth daily.     ALLERGIES:  Allergies as of 02/15/2011 - Review Complete 02/15/2011  Allergen Reaction Noted  . Codeine  06/30/2008  . Hydrocodone  01/12/2010   *causes "severe vomiting"  FAMILY HISTORY:  Family History  Problem Relation Age of Onset  . Colon cancer Mother   . Diabetes Mother   . Hypertension Mother   . Stroke Mother   . Heart attack Father   . Diabetes Father   . Hypertension Father   . Colon cancer Sister   . Lupus Brother   . Parkinsonism Brother      SOCIAL HISTORY: History   Social History  . Marital Status: Married    Spouse Name: N/A    Number of Children: 3  . Years of Education: N/A   Occupational History  .     Social History Main Topics  . Smoking status: Former Games developer  . Smokeless tobacco: None  . Alcohol Use: No  . Drug Use: No  . Sexually Active: None   Other Topics Concern  . None   Social History Narrative  . None    Review of Systems  Constitutional: Negative for fever.  HENT: Negative for congestion and trouble swallowing.   Eyes: Negative for redness.  Respiratory: Negative for  chest tightness.   Skin: Positive for rash.   Physical Exam  BP 127/87  Pulse 97  Temp(Src) 98.4 F (36.9 C) (Oral)  Resp 20  Ht 5\' 7"  (1.702 m)  Wt 185 lb (83.915 kg)  BMI 28.97 kg/m2  SpO2 99%  Physical Exam  Constitutional: She is oriented to person, place, and time. She appears well-developed and well-nourished. No distress.  HENT:  Head: Normocephalic and atraumatic.  Mouth/Throat: Oropharynx is clear and moist.       No throat swelling  Eyes: Conjunctivae are normal. Pupils are equal, round, and reactive to light.  Neck: Normal range of motion. Neck supple.       No swelling  Cardiovascular: Normal rate and regular rhythm.   No murmur heard. Pulmonary/Chest: Effort normal and breath sounds normal. She has no wheezes.  Abdominal: She exhibits no distension.  Musculoskeletal:  Normal range of motion. She exhibits no edema and no tenderness.  Neurological: She is alert and oriented to person, place, and time.  Skin: Skin is warm and dry. Rash noted.       Slightly raised red rash, irregular, peeling somewhat, scattered on the face.  Psychiatric:       Slightly anxious    ED Course  Procedures  OTHER DATA REVIEWED: Nursing notes and vital signs reviewed.   MDM: Allergic dermatitis; d/c with prednisone and f/u with PCP   IMPRESSION Allergic dermatitis   PLAN:  Discharge home The patient is to return the emergency department if there is any worsening of symptoms. I have reviewed the discharge instructions with the patient  CONDITION ON DISCHARGE: stable   DISCHARGE MEDICATIONS: New Prescriptions   PREDNISONE (DELTASONE) 10 MG TABLET    Take 2 tablets (20 mg total) by mouth daily.     I personally performed the services described in this documentation, which was scribed in my presence. The recorded information has been reviewed and considered. Flint Melter, MD     Flint Melter, MD 02/15/11 [phone]

## 2011-03-05 ENCOUNTER — Encounter: Payer: Self-pay | Admitting: Family Medicine

## 2011-03-05 ENCOUNTER — Ambulatory Visit (INDEPENDENT_AMBULATORY_CARE_PROVIDER_SITE_OTHER): Payer: BC Managed Care – PPO | Admitting: Family Medicine

## 2011-03-05 VITALS — BP 118/76 | HR 78 | Ht 67.0 in | Wt 192.1 lb

## 2011-03-05 DIAGNOSIS — R21 Rash and other nonspecific skin eruption: Secondary | ICD-10-CM | POA: Insufficient documentation

## 2011-03-05 MED ORDER — PREDNISONE 10 MG PO TABS: ORAL_TABLET | ORAL | Status: DC

## 2011-03-05 MED ORDER — HYDROXYZINE PAMOATE 25 MG PO CAPS: 25.0000 mg | ORAL_CAPSULE | Freq: Four times a day (QID) | ORAL | Status: DC | PRN

## 2011-03-05 NOTE — Progress Notes (Signed)
  Subjective:    Patient ID: Latoya Decker, female    DOB: [birth year], 50 y.o.   MRN: [id number]  HPI Patient presents with extensive facial rash and rash on chest for approximately 3 weeks. She remembers using an old hairspray and the next day woke up with a rash 4 head which began to progress. This was approximately 3 weeks ago. She was seen in the emergency department at that time. Given a prednisone burst which helped the itching however did not change the progression of the rash. She now has peeling over her forehead as well as an intense itching sensation in the face. She also noticed that she has a rash on her chest which came up approximately a week ago. She denies any difficulty breathing but says the rash in the chest causes her chest feel tight at times. She's been using Benadryl which wears off quickly and does not help as much. She denies any change in soap, lotion, makeup, new food, new medications. She has been using perfume but did not suffer any on her face. She presents today as she has had no relief from the rash and is now afraid because of the peeling that she may blister.   Review of Systems Denies Fever, N/V, SOB, current CP, change in vision,no sick contacts, no history of previous rash     Objective:   Physical Exam  GEN- NAD, alert and oriented x 3  HEENT- PERRL, EOMI, non icteric, non injected sclera, MMM, no oral lesions, nares clear  CVS-RRR, no murmur  RESP-CTAB SKIN- diffuse raised erythematous peeling rash on forehead, nasal bridge,malar region bilat, extending across upper lip and around mouth to chin, neck spared,slightly raised macular rash on chest  Lymph- No LAD in cervical region      Assessment & Plan:

## 2011-03-05 NOTE — Assessment & Plan Note (Signed)
Patient is a rash/facial dermatitis which appears to be secondary to an irritant. I'm surprised did not respond better to the first course of prednisone given by the ER physician. I will give her an injection of Depo-Medrol today. We'll start her on a prednisone taper. She will switch to Vistaril with hopes that this will control her itching all that better than Benadryl. I will set her up with dermatology secondary to little response with initial treatment.

## 2011-03-05 NOTE — Patient Instructions (Signed)
For the rash- continue to avoid any fragrances or harsh soaps, regular water is fine for now. Use the steroids as directed Use the vistaril instead of your benadryl We will get you to a dermatologist and call with appt time.  If you have chest pain, difficulty breathing, develop worsening rash or mouth sores, please go to ER to be seen or call for office visit.

## 2011-03-15 ENCOUNTER — Ambulatory Visit (INDEPENDENT_AMBULATORY_CARE_PROVIDER_SITE_OTHER): Payer: BC Managed Care – PPO | Admitting: Family Medicine

## 2011-03-15 ENCOUNTER — Encounter (HOSPITAL_COMMUNITY): Payer: BC Managed Care – PPO | Admitting: Psychiatry

## 2011-03-15 ENCOUNTER — Encounter: Payer: Self-pay | Admitting: Family Medicine

## 2011-03-15 DIAGNOSIS — R5383 Other fatigue: Secondary | ICD-10-CM

## 2011-03-15 DIAGNOSIS — R51 Headache: Secondary | ICD-10-CM

## 2011-03-15 DIAGNOSIS — F439 Reaction to severe stress, unspecified: Secondary | ICD-10-CM

## 2011-03-15 DIAGNOSIS — I10 Essential (primary) hypertension: Secondary | ICD-10-CM

## 2011-03-15 DIAGNOSIS — Z733 Stress, not elsewhere classified: Secondary | ICD-10-CM

## 2011-03-15 DIAGNOSIS — R5381 Other malaise: Secondary | ICD-10-CM

## 2011-03-15 DIAGNOSIS — F329 Major depressive disorder, single episode, unspecified: Secondary | ICD-10-CM

## 2011-03-15 DIAGNOSIS — F3289 Other specified depressive episodes: Secondary | ICD-10-CM

## 2011-03-15 MED ORDER — FLUOXETINE HCL 20 MG PO CAPS: 20.0000 mg | ORAL_CAPSULE | Freq: Every day | ORAL | Status: DC

## 2011-03-15 MED ORDER — BUTALBITAL-APAP-CAFFEINE 50-325-40 MG PO TABS: ORAL_TABLET | ORAL | Status: DC

## 2011-03-15 MED ORDER — TEMAZEPAM 30 MG PO CAPS: 30.0000 mg | ORAL_CAPSULE | Freq: Every evening | ORAL | Status: DC | PRN

## 2011-03-15 NOTE — Patient Instructions (Signed)
Start the low dose prozac, take at bedtime You have been given a letter to be out of work, please have them send the FMLA form to our office Get the blood work done in the next few weeks.  F/U in 3 weeks

## 2011-03-15 NOTE — Progress Notes (Signed)
  Subjective:    Patient ID: Latoya Decker, female    DOB: 07/08/1961, 50 y.o.   MRN: [id number]  HPI  Stress- pt seen by  Banem a counselor at [facility], she has been seen for the past month, currently not on meds, - has been very stressed out, and fatigued, work is difficult working 5 days and she should be part-time, only 3 days a week, ,+ difficulty sleeping, has been oversleeping on some days, upset because her granddaughter missed the bus today,  Out of Temazepam which she uses for work Migraines are worse than usual with the stress She would like to start medication for her mood, she also has discussed with her family and would like to take 4 weeks off, secondary to her mood and fatigue which she assures is caused by her job and lack of rest. She thinks she needs to take a break for the sake of her health both mental and physical   Seen by derm- diagnosis Seborrheic dermatitis Review of Systems   GEN- + fatigue, fever, weight loss,weakness, recent illness HEENT- denies , change in vision,  CVS- denies chest pain, palpitations RESP- denies SOB, cough, wheeze ABD- denies N/V, change in stools, abd pain Neuro- +headache, deniesndizziness, syncope, seizure activity      Objective:   Physical Exam  GEN- NAD, alert and oriented, overweight, looks tired today  Psych- Not depressed appearing, anxious appearing, normal speech, no apparent hallucinations, normal thought process Skin- rash on face resolved       Assessment & Plan:

## 2011-03-16 NOTE — Assessment & Plan Note (Signed)
Pt to continue therapy, will treat with prozac per above for mood disorder as well

## 2011-03-16 NOTE — Assessment & Plan Note (Signed)
Likley secondary to mood and stress. I will check a thyroid level

## 2011-03-16 NOTE — Assessment & Plan Note (Signed)
Migraine disorder worse with stress. Given Fiorcet for breakthrough HA, on Topamax

## 2011-03-16 NOTE — Assessment & Plan Note (Signed)
Pt has history of depression and anxiety, feels very overwhelmed and stressed at this time. Will start on Prozac daily. I have given her a letter for work, I discussed that her FMLA form will be secondary to mood disorder and fatigue

## 2011-03-21 LAB — BASIC METABOLIC PANEL
CO2: 19 mEq/L (ref 19–32)
Glucose, Bld: 90 mg/dL (ref 70–99)
Potassium: 4.1 mEq/L (ref 3.5–5.3)
Sodium: 138 mEq/L (ref 135–145)

## 2011-03-22 LAB — CBC
HCT: 42.1 % (ref 36.0–46.0)
MCHC: 33 g/dL (ref 30.0–36.0)
MCV: 92.3 fL (ref 78.0–100.0)
RDW: 12.7 % (ref 11.5–15.5)
WBC: 9 10*3/uL (ref 4.0–10.5)

## 2011-04-02 ENCOUNTER — Encounter (INDEPENDENT_AMBULATORY_CARE_PROVIDER_SITE_OTHER): Payer: BC Managed Care – PPO | Admitting: Psychiatry

## 2011-04-02 DIAGNOSIS — F4323 Adjustment disorder with mixed anxiety and depressed mood: Secondary | ICD-10-CM

## 2011-04-05 ENCOUNTER — Encounter: Payer: Self-pay | Admitting: Family Medicine

## 2011-04-05 ENCOUNTER — Ambulatory Visit (INDEPENDENT_AMBULATORY_CARE_PROVIDER_SITE_OTHER): Payer: BC Managed Care – PPO | Admitting: Family Medicine

## 2011-04-05 VITALS — BP 110/86 | HR 74 | Resp 16 | Ht 67.0 in | Wt 188.1 lb

## 2011-04-05 DIAGNOSIS — Z733 Stress, not elsewhere classified: Secondary | ICD-10-CM

## 2011-04-05 DIAGNOSIS — R109 Unspecified abdominal pain: Secondary | ICD-10-CM

## 2011-04-05 DIAGNOSIS — F3289 Other specified depressive episodes: Secondary | ICD-10-CM

## 2011-04-05 DIAGNOSIS — F439 Reaction to severe stress, unspecified: Secondary | ICD-10-CM

## 2011-04-05 DIAGNOSIS — F329 Major depressive disorder, single episode, unspecified: Secondary | ICD-10-CM

## 2011-04-05 NOTE — Progress Notes (Signed)
  Subjective:    Patient ID: Latoya Decker, female    DOB: 1961/05/21, 50 y.o.   MRN: [id number]  HPI  Abd pain- has recurrent bouts of  abd pain, in lower qaudrants, a few days ago had a a bout of pain, no emesis, no fever, has had loose stools for past few days, occurs after each meal- has had a max of  4 loose stools  a day, no history of consitpation. Was told she had diverticulitis??, CT scan from 2011 and 2012 were normal, no colonoscopy, no sick contacts . History of colon cancer in family as well as abd problems   Depression and stress- pt has been out of work for 3 weeks, feels the time off made a difference.Feels rested, less stressed and happier. She has spoken with her work and will return in 1 week as previously scheduled. Feels prozac helps keep her even. Continues to follow with her counselor, she will f/u with her in 2 weeks after being on the job.   [employer] form completed during visit as well as return to work form.       Review of Systems - per above   GU-no dysuria, no hematuria, no Vaginal discharge or bleeding    Objective:   Physical Exam GEN-NAD,alert and oriented x 3 ABD-NABS, Soft, very mild TTP in lower quadrants, no rebound, no gaurding, no masses, no CVA tenderness Psych- normal affects, not depressed or overly anxious appearing, no apparent SI, no hallucinations      Assessment & Plan:

## 2011-04-05 NOTE — Patient Instructions (Addendum)
I will refer you to the stomach specialist Try to avoid dairy products, eat bland foods if you have abdominal pain Use prilosec over the counter if needed Continue the prozac daily  F/u as scheduled with Dr. Lodema Hong

## 2011-04-07 DIAGNOSIS — R109 Unspecified abdominal pain: Secondary | ICD-10-CM | POA: Insufficient documentation

## 2011-04-07 NOTE — Assessment & Plan Note (Signed)
Improved with time off, Return to work in 1 week. Forms completed and scanned

## 2011-04-07 NOTE — Assessment & Plan Note (Signed)
Continue prozac, pt appears to be doing well F/u with couselor and PCP in next few weeks

## 2011-04-07 NOTE — Assessment & Plan Note (Signed)
Pt has history of gastritis and ulcer in the past, however symptoms do not fit that picture. She has had recurrent lower abd pain with neg CT scans, none have shown diverticulitis. With recurrent pain and history of early colon cancer in 1st degree relative will send to GI for evaluation and colonoscopy.

## 2011-05-08 ENCOUNTER — Encounter: Payer: Self-pay | Admitting: Family Medicine

## 2011-05-10 ENCOUNTER — Ambulatory Visit: Payer: BC Managed Care – PPO | Admitting: Family Medicine

## 2011-05-14 ENCOUNTER — Encounter: Payer: Self-pay | Admitting: Urgent Care

## 2011-05-14 ENCOUNTER — Ambulatory Visit (INDEPENDENT_AMBULATORY_CARE_PROVIDER_SITE_OTHER): Payer: BC Managed Care – PPO | Admitting: Urgent Care

## 2011-05-14 DIAGNOSIS — R109 Unspecified abdominal pain: Secondary | ICD-10-CM

## 2011-05-14 DIAGNOSIS — Z800 Family history of malignant neoplasm of digestive organs: Secondary | ICD-10-CM

## 2011-05-14 DIAGNOSIS — A048 Other specified bacterial intestinal infections: Secondary | ICD-10-CM

## 2011-05-14 MED ORDER — ESOMEPRAZOLE MAGNESIUM 40 MG PO CPDR: 40.0000 mg | DELAYED_RELEASE_CAPSULE | Freq: Every day | ORAL | Status: DC

## 2011-05-14 NOTE — Progress Notes (Signed)
Primary Care Physician:  Syliva Overman, MD, MD Primary Gastroenterologist:  Dr. Darrick Penna  Chief Complaint  Patient presents with  . Abdominal Pain  . Diarrhea   HPI:  Latoya Decker is a 50 y.o. female here for evaluation of abdominal pain x several weeks.  C/o RLQ pain radiates to LLQ.  Does not radiate to back.  Pain 9/10 at worst, lingering 5-6 pain.  Pain worse w/ movement.  "Like having a baby."  Couple weeks ago constant diarrhea. Diarrhea better now without rectal bleeding or melena.  C/o nausea, no vomiting.  No recent antibiotics, new meds.  City water-drink bottled water.  No constipation.    Recent CT ABDOMEN AND PELVIS WITH CONTRAST: normal  Lab Summary Latest Ref Rng 03/15/2011 12/06/2010  Hemoglobin 12.0 - 15.0 g/dL 09.8 11.9  Hematocrit 14.7 - 46.0 % 42.1 39.2  White count 4.0 - 10.5 K/uL 9.0 7.2  Platelet count 150 - 400 K/uL 260 217  Sodium 135 - 145 mEq/L 138 140  Potassium 3.5 - 5.3 mEq/L 4.1 3.7  Calcium 8.4 - 10.5 mg/dL 8.9 82.9  Phosphorus  (None) (None)  Creatinine 0.50 - 1.10 mg/dL 5.62 1.30  AST 0 - 37 U/L (None) 14  Alk Phos 39 - 117 U/L (None) 153 (H)  Bilirubin 0.3 - 1.2 mg/dL (None) 0.1 (L)  Glucose 70 - 99 mg/dL 90 98  Cholesterol  (None) (None)  HDL cholesterol  (None) (None)  Triglycerides  (None) (None)  LDL calc  (None) (None)  LDL direct  (None) (None)  Total protein 6.0 - 8.3 g/dL (None) 7.5  Albumin 3.5 - 5.2 g/dL (None) 3.9   Past Medical History  Diagnosis Date  . Anxiety   . Depression   . Headache     migraines  . Duodenal ulcer 01/05/2010    EGD Dr Steffanie Dunn, h pylori gastritis, completed treatment  . Duodenitis   . Gastritis     hospitalized 6/23-6/25 2011   . PUD (peptic ulcer disease)     Dr Katrinka Blazing  . S/P colonoscopy     ? Dr Smith-believes polyps?  . Family hx of colon cancer     Mother & sister  . Helicobacter pylori gastritis     Past Surgical History  Procedure Date  . Cholecystectomy 1997  . Bilateral tubal  ligation 1989  . Partial hysterectomy 2007    1 ovary removed    Current Outpatient Prescriptions  Medication Sig Dispense Refill  . butalbital-acetaminophen-caffeine (FIORICET) 50-325-40 MG per tablet Take one tab by mouth at onset of headache and repeat in 4-6 hrs if headache persists  50 tablet  3  . desonide (DESOWEN) 0.05 % cream Apply 1 application topically 2 (two) times daily.        . ergocalciferol (VITAMIN D2) 50000 UNITS capsule Take 1 capsule (50,000 Units total) by mouth once a week.  4 capsule  3  . FLUoxetine (PROZAC) 20 MG capsule Take 1 capsule (20 mg total) by mouth daily.  30 capsule  3  . ketoconazole (NIZORAL) 2 % shampoo Apply 1 application topically 2 (two) times a week.        . temazepam (RESTORIL) 30 MG capsule Take 1 capsule (30 mg total) by mouth at bedtime as needed.  30 capsule  3  . topiramate (TOPAMAX) 25 MG capsule Take 1 capsule (25 mg total) by mouth 2 (two) times daily. Take 2 tablets by mouth at bedtime  60 capsule  3  . traMADol (ULTRAM)  50 MG tablet As needed      . triamterene-hydrochlorothiazide (MAXZIDE-25) 37.5-25 MG per tablet Take 1 tablet by mouth daily.  30 tablet  11  . esomeprazole (NEXIUM) 40 MG capsule Take 1 capsule (40 mg total) by mouth daily before breakfast.  31 capsule  5    Allergies as of 05/14/2011 - Review Complete 05/14/2011  Allergen Reaction Noted  . Codeine Nausea And Vomiting 06/30/2008  . Hydrocodone  01/12/2010    Family History  Problem Relation Age of Onset  . Colon cancer Mother 45  . Diabetes Mother   . Hypertension Mother   . Stroke Mother   . Heart attack Father   . Diabetes Father   . Hypertension Father   . Colon cancer Sister 84  . Lupus Brother   . Parkinsonism Brother    History   Social History  . Marital Status: Married    Spouse Name: N/A    Number of Children: 3  . Years of Education: N/A   Occupational History  . Dialysis Tech    Social History Main Topics  . Smoking status: Never  Smoker   . Smokeless tobacco: Not on file  . Alcohol Use: No  . Drug Use: No  . Sexually Active: Yes -- Female partner(s)    Birth Control/ Protection: Surgical   Other Topics Concern  . Not on file   Social History Narrative  . No narrative on file    Review of Systems: Gen: See HPI CV: Denies chest pain, angina, palpitations, syncope, orthopnea, PND, peripheral edema, and claudication. Resp: Denies dyspnea at rest, dyspnea with exercise, cough, sputum, wheezing, coughing up blood, and pleurisy. GI: Denies vomiting blood, jaundice, and fecal incontinence.   GU : Denies urinary burning, blood in urine, urinary frequency, urinary hesitancy, nocturnal urination, and urinary incontinence. MS: Denies joint pain, limitation of movement, and swelling, stiffness, low back pain, extremity pain. Denies muscle weakness, cramps, atrophy.  Derm: Denies rash, itching, dry skin, hives, moles, warts, or unhealing ulcers.  Psych: Denies depression, anxiety, memory loss, suicidal ideation, hallucinations, paranoia, and confusion. Heme: Denies bruising, bleeding, and enlarged lymph nodes.  Physical Exam: BP 124/78  Pulse 76  Temp(Src) 97.5 F (36.4 C) (Temporal)  Ht 5\' 7"  (1.702 m)  Wt 189 lb (85.73 kg)  BMI 29.60 kg/m2 General:   Alert,  Well-developed, well-nourished, pleasant and cooperative in NAD Head:  Normocephalic and atraumatic. Eyes:  Sclera clear, no icterus.   Conjunctiva pink. Ears:  Normal auditory acuity. Nose:  No deformity, discharge,  or lesions. Mouth:  No deformity or lesions, OP pink/moist. Neck:  Supple; no masses or thyromegaly. Lungs:  Clear throughout to auscultation.   No wheezes, crackles, or rhonchi. No acute distress. Heart:  Regular rate and rhythm; no murmurs, clicks, rubs,  or gallops. Abdomen:  Soft and nondistended. Mild tenderness to RLQ & less tender to LLQ.  No masses, hepatosplenomegaly or hernias noted. Normal bowel sounds, without guarding, and without  rebound.   Rectal:  Deferred until time of colonoscopy.   Msk:  Symmetrical without gross deformities. Normal posture. Pulses:  Normal pulses noted. Extremities:  Without clubbing or edema. Neurologic:  Alert and  oriented x4;  grossly normal neurologically. Skin:  Intact without significant lesions or rashes. Cervical Nodes:  No significant cervical adenopathy. Psych:  Alert and cooperative. Normal mood and affect.

## 2011-05-14 NOTE — Patient Instructions (Signed)
Go get your labs  To ER if severe pain Begin omeprazole 20 mg before breakfast  Abdominal Pain Abdominal pain can be caused by many things. Your caregiver decides the seriousness of your pain by an examination and possibly blood tests and X-rays. Many cases can be observed and treated at home. Most abdominal pain is not caused by a disease and will probably improve without treatment. However, in many cases, more time must pass before a clear cause of the pain can be found. Before that point, it may not be known if you need more testing, or if hospitalization or surgery is needed. HOME CARE INSTRUCTIONS   Do not take laxatives unless directed by your caregiver.   Take pain medicine only as directed by your caregiver.   Only take over-the-counter or prescription medicines for pain, discomfort, or fever as directed by your caregiver.   Try a clear liquid diet (broth, tea, or water) for as long as directed by your caregiver. Slowly move to a bland diet as tolerated.  SEEK IMMEDIATE MEDICAL CARE IF:   The pain does not go away.   You have a fever.   You keep throwing up (vomiting).   The pain is felt only in portions of the abdomen. Pain in the right side could possibly be appendicitis. In an adult, pain in the left lower portion of the abdomen could be colitis or diverticulitis.   You pass bloody or black tarry stools.  MAKE SURE YOU:   Understand these instructions.   Will watch your condition.   Will get help right away if you are not doing well or get worse.  Document Released: 04/10/2005 Document Revised: 03/13/2011 Document Reviewed: 02/17/2008 [facility] Patient Information 2012 [place], [place].

## 2011-05-15 ENCOUNTER — Encounter: Payer: Self-pay | Admitting: Urgent Care

## 2011-05-15 LAB — CBC WITH DIFFERENTIAL/PLATELET
Basophils Relative: 0 % (ref 0–1)
HCT: 38.5 % (ref 36.0–46.0)
Hemoglobin: 12.4 g/dL (ref 12.0–15.0)
Lymphs Abs: 2.6 10*3/uL (ref 0.7–4.0)
MCHC: 32.2 g/dL (ref 30.0–36.0)
Monocytes Absolute: 0.6 10*3/uL (ref 0.1–1.0)
Monocytes Relative: 8 % (ref 3–12)
Neutro Abs: 3.7 10*3/uL (ref 1.7–7.7)

## 2011-05-15 LAB — HEPATIC FUNCTION PANEL
ALT: 16 U/L (ref 0–35)
AST: 17 U/L (ref 0–37)
Alkaline Phosphatase: 134 U/L — ABNORMAL HIGH (ref 39–117)
Bilirubin, Direct: <0.1 mg/dL (ref 0.0–0.3)

## 2011-05-15 LAB — URINALYSIS W MICROSCOPIC + REFLEX CULTURE
Bilirubin Urine: NEGATIVE
Glucose, UA: NEGATIVE mg/dL
Hgb urine dipstick: NEGATIVE
Ketones, ur: NEGATIVE mg/dL
Protein, ur: NEGATIVE mg/dL
RBC / HPF: NONE SEEN RBC/hpf (ref <3)
WBC, UA: NONE SEEN WBC/hpf (ref <3)

## 2011-05-15 LAB — LIPASE: Lipase: 19 U/L (ref 0–75)

## 2011-05-15 NOTE — Progress Notes (Signed)
Cc to PCP 

## 2011-05-15 NOTE — Assessment & Plan Note (Signed)
Family history of colon cancer in both mother and sister. Due for colonoscopy. I have discussed risks & benefits which include, but are not limited to, bleeding, infection, perforation & drug reaction.  The patient agrees with this plan & written consent will be obtained.

## 2011-05-15 NOTE — Assessment & Plan Note (Signed)
Latoya Decker is a 50 y.o. female with lower abdominal pain & tenderness without explanation on recent CT. History of peptic ulcer & duodenal ulcer, however clinically this does not fit the picture. History of H. pylori gastritis. Strong family history of colon cancer and she believes she is due for colonoscopy.    Check urinalysis, CBC, LFTs, amylase and lipase, H. pylori stool

## 2011-05-15 NOTE — Assessment & Plan Note (Signed)
Begin omeprazole 20 mg daily for gastric protection. Check H. pylori stool antigen

## 2011-05-16 ENCOUNTER — Other Ambulatory Visit: Payer: Self-pay

## 2011-05-16 ENCOUNTER — Other Ambulatory Visit: Payer: Self-pay | Admitting: Urgent Care

## 2011-05-16 ENCOUNTER — Encounter: Payer: Self-pay | Admitting: Urgent Care

## 2011-05-16 HISTORY — PX: COLONOSCOPY: SHX174

## 2011-05-16 NOTE — Progress Notes (Addendum)
Reviewed 2005 colonoscopy which was normal and EGD which showed gastritis, superficial ulcers at the GE junction and antrum and gastritis. Please call patient. Her lab work shows an elevated alkaline phosphatase.  This is nonspecific. Otherwise her blood work was normal. Needs GGT and AMA JY:NWGNFAOZ ALP Did she return HP stool? Please arrange TCS re:FH colon ca & EGD re:Abd pain, hx PUD, DU, h pylori with SLF in OR Procedure will need to be done with deep sedation (propofol) in the OR under the direction of anesthesia services for multiple psychoactive meds. Thanks

## 2011-05-16 NOTE — Progress Notes (Signed)
Quick Note:  Await HP stool Needs AMA/GGT See addendum OV note ______

## 2011-05-17 LAB — HELICOBACTER PYLORI  SPECIAL ANTIGEN: H. PYLORI Antigen: NEGATIVE

## 2011-05-17 LAB — GAMMA GT: GGT: 34 U/L (ref 7–51)

## 2011-05-20 NOTE — Progress Notes (Signed)
Quick Note:  Please call pt & let her know all labs normal. Keep procedures as planned. Thanks ______

## 2011-05-20 NOTE — Progress Notes (Signed)
Quick Note:  Will discuss all labs together AMA pending. GGT normal. HP stool negative.  ______

## 2011-05-20 NOTE — Progress Notes (Signed)
Quick Note:  LMOM labs normal. Keep appt for procedures as planned. Call if you have any questions. ______

## 2011-05-21 ENCOUNTER — Other Ambulatory Visit: Payer: Self-pay | Admitting: Gastroenterology

## 2011-05-21 DIAGNOSIS — A048 Other specified bacterial intestinal infections: Secondary | ICD-10-CM

## 2011-05-21 DIAGNOSIS — K269 Duodenal ulcer, unspecified as acute or chronic, without hemorrhage or perforation: Secondary | ICD-10-CM

## 2011-05-21 NOTE — Progress Notes (Signed)
Pt is scheduled for 06/10/11 - instructions mailed

## 2011-05-29 ENCOUNTER — Encounter (HOSPITAL_COMMUNITY): Payer: Self-pay | Admitting: Pharmacy Technician

## 2011-06-03 NOTE — Progress Notes (Signed)
REVIEWED.  

## 2011-06-04 ENCOUNTER — Encounter (HOSPITAL_COMMUNITY)
Admission: RE | Admit: 2011-06-04 | Discharge: 2011-06-04 | Disposition: A | Discharge status: Home / Self Care | Payer: BC Managed Care – PPO | Source: Ambulatory Visit | Attending: Gastroenterology | Admitting: Gastroenterology

## 2011-06-04 ENCOUNTER — Other Ambulatory Visit: Payer: Self-pay | Admitting: Family Medicine

## 2011-06-04 ENCOUNTER — Other Ambulatory Visit: Payer: Self-pay

## 2011-06-04 ENCOUNTER — Encounter (HOSPITAL_COMMUNITY): Payer: Self-pay

## 2011-06-04 HISTORY — DX: Essential (primary) hypertension: I10

## 2011-06-04 LAB — BASIC METABOLIC PANEL
BUN: 10 mg/dL (ref 6–23)
Chloride: 105 mEq/L (ref 96–112)
Creatinine, Ser: 0.97 mg/dL (ref 0.50–1.10)
GFR calc Af Amer: 78 mL/min — ABNORMAL LOW (ref >90)
GFR calc non Af Amer: 67 mL/min — ABNORMAL LOW (ref >90)
Potassium: 4 mEq/L (ref 3.5–5.1)

## 2011-06-04 LAB — CBC
HCT: 40.3 % (ref 36.0–46.0)
MCHC: 33 g/dL (ref 30.0–36.0)
RDW: 12.5 % (ref 11.5–15.5)
WBC: 6.7 10*3/uL (ref 4.0–10.5)

## 2011-06-04 NOTE — Patient Instructions (Addendum)
20 Latoya Decker  06/04/2011   Your procedure is scheduled on:  06/10/2011  Report to [facility] at 645  AM.  Call this number if you have problems the morning of surgery: [phone]   Remember:   Do not eat food:After Midnight.  Do not drink clear liquids: After Midnight.  Take these medicines the morning of surgery with A SIP OF WATER: fiorcet,nexium,prozac, topamax,maxzide,temazepam,tramdol   Do not wear jewelry, make-up or nail polish.  Do not wear lotions, powders, or perfumes. You may wear deodorant.  Do not shave 48 hours prior to surgery.  Do not bring valuables to the hospital.  Contacts, dentures or bridgework may not be worn into surgery.  Leave suitcase in the car. After surgery it may be brought to your room.  For patients admitted to the hospital, checkout time is 11:00 AM the day of discharge.   Patients discharged the day of surgery will not be allowed to drive home.  Name and phone number of your driver: family  Special Instructions: N/A   Please read over the following fact sheets that you were given: Pain Booklet, Surgical Site Infection Prevention, Anesthesia Post-op Instructions and Care and Recovery After Surgery Esophagogastroduodenoscopy This is an endoscopic procedure (a procedure that uses a device like a flexible telescope) that allows your caregiver to view the upper stomach and small bowel. This test allows your caregiver to look at the esophagus. The esophagus carries food from your mouth to your stomach. They can also look at your duodenum. This is the first part of the small intestine that attaches to the stomach. This test is used to detect problems in the bowel such as ulcers and inflammation. PREPARATION FOR TEST Nothing to eat after midnight the day before the test. NORMAL FINDINGS Normal esophagus, stomach, and duodenum. Ranges for normal findings may vary among different laboratories and hospitals. You should always check with your doctor after  having lab work or other tests done to discuss the meaning of your test results and whether your values are considered within normal limits. MEANING OF TEST  Your caregiver will go over the test results with you and discuss the importance and meaning of your results, as well as treatment options and the need for additional tests if necessary. OBTAINING THE TEST RESULTS It is your responsibility to obtain your test results. Ask the lab or department performing the test when and how you will get your results. Document Released: 11/01/2004 Document Revised: 03/13/2011 Document Reviewed: 06/10/2008 [facility] Patient Information 2012 [place], [place].Colonoscopy A colonoscopy is an exam to evaluate your entire colon. In this exam, your colon is cleansed. A long fiberoptic tube is inserted through your rectum and into your colon. The fiberoptic scope (endoscope) is a long bundle of enclosed and very flexible fibers. These fibers transmit light to the area examined and send images from that area to your caregiver. Discomfort is usually minimal. You may be given a drug to help you sleep (sedative) during or prior to the procedure. This exam helps to detect lumps (tumors), polyps, inflammation, and areas of bleeding. Your caregiver may also take a small piece of tissue (biopsy) that will be examined under a microscope. LET YOUR CAREGIVER KNOW ABOUT:   Allergies to food or medicine.   Medicines taken, including vitamins, herbs, eyedrops, over-the-counter medicines, and creams.   Use of steroids (by mouth or creams).   Previous problems with anesthetics or numbing medicines.   History of bleeding problems or blood clots.  Previous surgery.   Other health problems, including diabetes and kidney problems.   Possibility of pregnancy, if this applies.  BEFORE THE PROCEDURE   A clear liquid diet may be required for 2 days before the exam.   Ask your caregiver about changing or stopping your regular  medications.   Liquid injections (enemas) or laxatives may be required.   A large amount of electrolyte solution may be given to you to drink over a short period of time. This solution is used to clean out your colon.   You should be present 60 minutes prior to your procedure or as directed by your caregiver.  AFTER THE PROCEDURE   If you received a sedative or pain relieving medication, you will need to arrange for someone to drive you home.   Occasionally, there is a little blood passed with the first bowel movement. Do not be concerned.  FINDING OUT THE RESULTS OF YOUR TEST Not all test results are available during your visit. If your test results are not back during the visit, make an appointment with your caregiver to find out the results. Do not assume everything is normal if you have not heard from your caregiver or the medical facility. It is important for you to follow up on all of your test results. HOME CARE INSTRUCTIONS   It is not unusual to pass moderate amounts of gas and experience mild abdominal cramping following the procedure. This is due to air being used to inflate your colon during the exam. Walking or a warm pack on your belly (abdomen) may help.   You may resume all normal meals and activities after sedatives and medicines have worn off.   Only take over-the-counter or prescription medicines for pain, discomfort, or fever as directed by your caregiver. Do not use aspirin or blood thinners if a biopsy was taken. Consult your caregiver for medicine usage if biopsies were taken.  SEEK IMMEDIATE MEDICAL CARE IF:   You have a fever.   You pass large blood clots or fill a toilet with blood following the procedure. This may also occur 10 to 14 days following the procedure. This is more likely if a biopsy was taken.   You develop abdominal pain that keeps getting worse and cannot be relieved with medicine.  Document Released: 06/28/2000 Document Revised: 03/13/2011  Document Reviewed: 02/11/2008 [facility] Patient Information 2012 [place], [place].PATIENT INSTRUCTIONS POST-ANESTHESIA  IMMEDIATELY FOLLOWING SURGERY:  Do not drive or operate machinery for the first twenty four hours after surgery.  Do not make any important decisions for twenty four hours after surgery or while taking narcotic pain medications or sedatives.  If you develop intractable nausea and vomiting or a severe headache please notify your doctor immediately.  FOLLOW-UP:  Please make an appointment with your surgeon as instructed. You do not need to follow up with anesthesia unless specifically instructed to do so.  WOUND CARE INSTRUCTIONS (if applicable):  Keep a dry clean dressing on the anesthesia/puncture wound site if there is drainage.  Once the wound has quit draining you may leave it open to air.  Generally you should leave the bandage intact for twenty four hours unless there is drainage.  If the epidural site drains for more than 36-48 hours please call the anesthesia department.  QUESTIONS?:  Please feel free to call your physician or the hospital operator if you have any questions, and they will be happy to assist you.     [facility] Anesthesia Department [phone]  Kela Millin [place] [phone]

## 2011-06-05 NOTE — H&P (Signed)
BP Pulse Temp(Src) Ht Wt BMI    124/78  76  97.5 F (36.4 C) (Temporal)  5\' 7"  (1.702 m)  189 lb (85.73 kg)  29.60 kg/m2       Progress Notes     Lorenza Burton, NP  05/15/2011  2:28 PM  Signed Primary Care Physician:  Syliva Overman, MD, MD Primary Gastroenterologist:  Dr. Darrick Penna    Chief Complaint   Patient presents with   .  Abdominal Pain   .  Diarrhea    HPI:  Latoya Decker is a 50 y.o. female here for evaluation of abdominal pain x several weeks.  C/o RLQ pain radiates to LLQ.  Does not radiate to back.  Pain 9/10 at worst, lingering 5-6 pain.  Pain worse w/ movement.  "Like having a baby."  Couple weeks ago constant diarrhea. Diarrhea better now without rectal bleeding or melena.  C/o nausea, no vomiting.  No recent antibiotics, new meds.  City water-drink bottled water.  No constipation.     Recent CT ABDOMEN AND PELVIS WITH CONTRAST: normal    Lab Summary  Latest Ref Rng  03/15/2011  12/06/2010   Hemoglobin  12.0 - 15.0 g/dL  57.8  46.9   Hematocrit  36.0 - 46.0 %  42.1  39.2   White count  4.0 - 10.5 K/uL  9.0  7.2   Platelet count  150 - 400 K/uL  260  217   Sodium  135 - 145 mEq/L  138  140   Potassium  3.5 - 5.3 mEq/L  4.1  3.7   Calcium  8.4 - 10.5 mg/dL  8.9  62.9   Phosphorus    (None)  (None)   Creatinine  0.50 - 1.10 mg/dL  5.28  4.13   AST  0 - 37 U/L  (None)  14   Alk Phos  39 - 117 U/L  (None)  153 (H)   Bilirubin  0.3 - 1.2 mg/dL  (None)  0.1 (L)   Glucose  70 - 99 mg/dL  90  98   Cholesterol    (None)  (None)   HDL cholesterol    (None)  (None)   Triglycerides    (None)  (None)   LDL calc    (None)  (None)   LDL direct    (None)  (None)   Total protein  6.0 - 8.3 g/dL  (None)  7.5   Albumin  3.5 - 5.2 g/dL  (None)  3.9       Past Medical History   Diagnosis  Date   .  Anxiety     .  Depression     .  Headache         migraines   .  Duodenal ulcer  01/05/2010       EGD Dr Steffanie Dunn, h pylori gastritis, completed treatment   .   Duodenitis     .  Gastritis         hospitalized 6/23-6/25 2011    .  PUD (peptic ulcer disease)         Dr Katrinka Blazing   .  S/P colonoscopy         ? Dr Smith-believes polyps?   .  Family hx of colon cancer         Mother & sister   .  Helicobacter pylori gastritis         Past Surgical History  Procedure  Date   .  Cholecystectomy  1997   .  Bilateral tubal ligation  1989   .  Partial hysterectomy  2007       1 ovary removed       Current Outpatient Prescriptions   Medication  Sig  Dispense  Refill   .  butalbital-acetaminophen-caffeine (FIORICET) 50-325-40 MG per tablet  Take one tab by mouth at onset of headache and repeat in 4-6 hrs if headache persists   50 tablet   3   .  desonide (DESOWEN) 0.05 % cream  Apply 1 application topically 2 (two) times daily.           .  ergocalciferol (VITAMIN D2) 50000 UNITS capsule  Take 1 capsule (50,000 Units total) by mouth once a week.   4 capsule   3   .  FLUoxetine (PROZAC) 20 MG capsule  Take 1 capsule (20 mg total) by mouth daily.   30 capsule   3   .  ketoconazole (NIZORAL) 2 % shampoo  Apply 1 application topically 2 (two) times a week.           .  temazepam (RESTORIL) 30 MG capsule  Take 1 capsule (30 mg total) by mouth at bedtime as needed.   30 capsule   3   .  topiramate (TOPAMAX) 25 MG capsule  Take 1 capsule (25 mg total) by mouth 2 (two) times daily. Take 2 tablets by mouth at bedtime   60 capsule   3   .  traMADol (ULTRAM) 50 MG tablet  As needed         .  triamterene-hydrochlorothiazide (MAXZIDE-25) 37.5-25 MG per tablet  Take 1 tablet by mouth daily.   30 tablet   11   .  esomeprazole (NEXIUM) 40 MG capsule  Take 1 capsule (40 mg total) by mouth daily before breakfast.   31 capsule   5       Allergies as of 05/14/2011 - Review Complete 05/14/2011   Allergen  Reaction  Noted   .  Codeine  Nausea And Vomiting  06/30/2008   .  Hydrocodone    01/12/2010       Family History   Problem  Relation  Age of Onset   .  Colon  cancer  Mother  54   .  Diabetes  Mother     .  Hypertension  Mother     .  Stroke  Mother     .  Heart attack  Father     .  Diabetes  Father     .  Hypertension  Father     .  Colon cancer  Sister  88   .  Lupus  Brother     .  Parkinsonism  Brother      History       Social History   .  Marital Status:  Married       Spouse Name:  N/A       Number of Children:  3   .  Years of Education:  N/A       Occupational History   .  Dialysis Tech         Social History Main Topics   .  Smoking status:  Never Smoker    .  Smokeless tobacco:  Not on file   .  Alcohol Use:  No   .  Drug Use:  No   .  Sexually Active:  Yes -- Female partner(s)       Birth Control/ Protection:  Surgical       Other Topics  Concern   .  Not on file       Social History Narrative   .  No narrative on file      Review of Systems: Gen: See HPI CV: Denies chest pain, angina, palpitations, syncope, orthopnea, PND, peripheral edema, and claudication. Resp: Denies dyspnea at rest, dyspnea with exercise, cough, sputum, wheezing, coughing up blood, and pleurisy. GI: Denies vomiting blood, jaundice, and fecal incontinence.    GU : Denies urinary burning, blood in urine, urinary frequency, urinary hesitancy, nocturnal urination, and urinary incontinence. MS: Denies joint pain, limitation of movement, and swelling, stiffness, low back pain, extremity pain. Denies muscle weakness, cramps, atrophy.   Derm: Denies rash, itching, dry skin, hives, moles, warts, or unhealing ulcers.   Psych: Denies depression, anxiety, memory loss, suicidal ideation, hallucinations, paranoia, and confusion. Heme: Denies bruising, bleeding, and enlarged lymph nodes.   Physical Exam: BP 124/78  Pulse 76  Temp(Src) 97.5 F (36.4 C) (Temporal)  Ht 5\' 7"  (1.702 m)  Wt 189 lb (85.73 kg)  BMI 29.60 kg/m2 General:   Alert,  Well-developed, well-nourished, pleasant and cooperative in NAD Head:  Normocephalic and  atraumatic. Eyes:  Sclera clear, no icterus.   Conjunctiva pink. Ears:  Normal auditory acuity. Nose:  No deformity, discharge,  or lesions. Mouth:  No deformity or lesions, OP pink/moist. Neck:  Supple; no masses or thyromegaly. Lungs:  Clear throughout to auscultation.   No wheezes, crackles, or rhonchi. No acute distress. Heart:  Regular rate and rhythm; no murmurs, clicks, rubs,  or gallops. Abdomen:  Soft and nondistended. Mild tenderness to RLQ & less tender to LLQ.  No masses, hepatosplenomegaly or hernias noted. Normal bowel sounds, without guarding, and without rebound.    Rectal:  Deferred until time of colonoscopy.    Msk:  Symmetrical without gross deformities. Normal posture. Pulses:  Normal pulses noted. Extremities:  Without clubbing or edema. Neurologic:  Alert and  oriented x4;  grossly normal neurologically. Skin:  Intact without significant lesions or rashes. Cervical Nodes:  No significant cervical adenopathy. Psych:  Alert and cooperative. Normal mood and affect.         Glendora Score  05/15/2011  2:47 PM  Signed Cc to PCP  Lorenza Burton, NP  05/16/2011  8:26 AM  Addendum Reviewed 2005 colonoscopy which was normal and EGD which showed gastritis, superficial ulcers at the GE junction and antrum and gastritis. Please call patient. Her lab work shows an elevated alkaline phosphatase.  This is nonspecific. Otherwise her blood work was normal. Needs GGT and AMA ZO:XWRUEAVW ALP Did she return HP stool? Please arrange TCS re:FH colon ca & EGD re:Abd pain, hx PUD, DU, h pylori with SLF in OR Procedure will need to be done with deep sedation (propofol) in the OR under the direction of anesthesia services for multiple psychoactive meds. Thanks         Previous Version  Lorenza Burton, NP  05/16/2011  8:27 AM  Signed Quick Note:   Await HP stool Needs AMA/GGT See addendum OV note ______  Cherene [facility]  05/21/2011  8:13 AM  Signed Pt is scheduled for  06/10/11 - instructions mailed    Jonette Eva, MD  06/03/2011 10:29 AM  Signed REVIEWED.           Abdominal pain - Lorenza Burton,  NP  05/15/2011  2:25 PM  Signed Latoya Decker is a 50 y.o. female with lower abdominal pain & tenderness without explanation on recent CT. History of peptic ulcer & duodenal ulcer, however clinically this does not fit the picture. History of H. pylori gastritis. Strong family history of colon cancer and she believes she is due for colonoscopy.     Check urinalysis, CBC, LFTs, amylase and lipase, H. pylori stool     Family hx of colon cancer Lorenza Burton, NP  05/15/2011  2:26 PM  Signed Family history of colon cancer in both mother and sister. Due for colonoscopy. I have discussed risks & benefits which include, but are not limited to, bleeding, infection, perforation & drug reaction.  The patient agrees with this plan & written consent will be obtained.       HELICOBACTER PYLORI GASTRITIS Lorenza Burton, NP  05/15/2011  2:27 PM  Signed Begin omeprazole 20 mg daily for gastric protection. Check H. pylori stool antigen

## 2011-06-10 ENCOUNTER — Encounter (HOSPITAL_COMMUNITY): Payer: Self-pay | Admitting: Anesthesiology

## 2011-06-10 ENCOUNTER — Other Ambulatory Visit: Payer: Self-pay | Admitting: Gastroenterology

## 2011-06-10 ENCOUNTER — Ambulatory Visit (HOSPITAL_COMMUNITY)
Admission: RE | Admit: 2011-06-10 | Discharge: 2011-06-10 | Disposition: A | Discharge status: Home / Self Care | Payer: BC Managed Care – PPO | Source: Ambulatory Visit | Attending: Gastroenterology | Admitting: Gastroenterology

## 2011-06-10 ENCOUNTER — Encounter (HOSPITAL_COMMUNITY)
Admission: RE | Disposition: A | Discharge status: Home / Self Care | Payer: Self-pay | Source: Ambulatory Visit | Attending: Gastroenterology

## 2011-06-10 ENCOUNTER — Encounter (HOSPITAL_COMMUNITY): Payer: Self-pay | Admitting: *Deleted

## 2011-06-10 ENCOUNTER — Ambulatory Visit (HOSPITAL_COMMUNITY): Payer: BC Managed Care – PPO | Admitting: Anesthesiology

## 2011-06-10 DIAGNOSIS — K2940 Chronic atrophic gastritis without bleeding: Secondary | ICD-10-CM | POA: Insufficient documentation

## 2011-06-10 DIAGNOSIS — Z800 Family history of malignant neoplasm of digestive organs: Secondary | ICD-10-CM | POA: Insufficient documentation

## 2011-06-10 DIAGNOSIS — R197 Diarrhea, unspecified: Secondary | ICD-10-CM

## 2011-06-10 DIAGNOSIS — K2990 Gastroduodenitis, unspecified, without bleeding: Secondary | ICD-10-CM

## 2011-06-10 DIAGNOSIS — Z01812 Encounter for preprocedural laboratory examination: Secondary | ICD-10-CM | POA: Insufficient documentation

## 2011-06-10 DIAGNOSIS — D126 Benign neoplasm of colon, unspecified: Secondary | ICD-10-CM | POA: Insufficient documentation

## 2011-06-10 DIAGNOSIS — R109 Unspecified abdominal pain: Secondary | ICD-10-CM | POA: Insufficient documentation

## 2011-06-10 DIAGNOSIS — K5730 Diverticulosis of large intestine without perforation or abscess without bleeding: Secondary | ICD-10-CM

## 2011-06-10 DIAGNOSIS — Z01810 Encounter for preprocedural cardiovascular examination: Secondary | ICD-10-CM | POA: Insufficient documentation

## 2011-06-10 DIAGNOSIS — K2970 Gastritis, unspecified, without bleeding: Secondary | ICD-10-CM

## 2011-06-10 HISTORY — PX: POLYPECTOMY: SHX5525

## 2011-06-10 HISTORY — PX: BIOPSY: SHX5522

## 2011-06-10 SURGERY — COLONOSCOPY WITH PROPOFOL
Anesthesia: Monitor Anesthesia Care

## 2011-06-10 MED ORDER — LIDOCAINE HCL 1 % IJ SOLN
INTRAMUSCULAR | Status: DC | PRN
  Administered 2011-06-10: 25 mg via INTRADERMAL

## 2011-06-10 MED ORDER — BUTAMBEN-TETRACAINE-BENZOCAINE 2-2-14 % EX AERO
1.0000 | INHALATION_SPRAY | Freq: Once | CUTANEOUS | Status: AC
Start: 2011-06-10 — End: 2011-06-10
  Administered 2011-06-10: 1 via TOPICAL
  Filled 2011-06-10: qty 56

## 2011-06-10 MED ORDER — GLYCOPYRROLATE 0.2 MG/ML IJ SOLN
INTRAMUSCULAR | Status: AC
  Filled 2011-06-10: qty 1

## 2011-06-10 MED ORDER — MIDAZOLAM HCL 2 MG/2ML IJ SOLN
INTRAMUSCULAR | Status: AC
  Filled 2011-06-10: qty 2

## 2011-06-10 MED ORDER — MIDAZOLAM HCL 5 MG/5ML IJ SOLN
INTRAMUSCULAR | Status: DC | PRN
  Administered 2011-06-10: 2 mg via INTRAVENOUS

## 2011-06-10 MED ORDER — PROPOFOL 10 MG/ML IV EMUL
INTRAVENOUS | Status: AC
  Filled 2011-06-10: qty 20

## 2011-06-10 MED ORDER — WATER FOR IRRIGATION, STERILE IR SOLN
Status: DC | PRN
  Administered 2011-06-10: 1000 mL

## 2011-06-10 MED ORDER — LACTATED RINGERS IV SOLN
INTRAVENOUS | Status: DC
  Administered 2011-06-10: 08:00:00 via INTRAVENOUS

## 2011-06-10 MED ORDER — GLYCOPYRROLATE 0.2 MG/ML IJ SOLN
0.2000 mg | Freq: Once | INTRAMUSCULAR | Status: AC | PRN
  Administered 2011-06-10: 0.2 mg via INTRAVENOUS

## 2011-06-10 MED ORDER — STERILE WATER FOR IRRIGATION IR SOLN
Status: DC | PRN
  Administered 2011-06-10: 09:00:00

## 2011-06-10 MED ORDER — ONDANSETRON HCL 4 MG/2ML IJ SOLN
INTRAMUSCULAR | Status: AC
  Filled 2011-06-10: qty 2

## 2011-06-10 MED ORDER — MIDAZOLAM HCL 2 MG/2ML IJ SOLN
1.0000 mg | INTRAMUSCULAR | Status: DC | PRN
  Administered 2011-06-10: 2 mg via INTRAVENOUS

## 2011-06-10 MED ORDER — PROPOFOL 10 MG/ML IV EMUL
INTRAVENOUS | Status: DC | PRN
  Administered 2011-06-10: 65 ug/kg/min via INTRAVENOUS

## 2011-06-10 MED ORDER — OMEPRAZOLE 20 MG PO CPDR: DELAYED_RELEASE_CAPSULE | ORAL | Status: DC

## 2011-06-10 MED ORDER — ONDANSETRON HCL 4 MG/2ML IJ SOLN
4.0000 mg | Freq: Once | INTRAMUSCULAR | Status: AC
  Administered 2011-06-10: 4 mg via INTRAVENOUS

## 2011-06-10 SURGICAL SUPPLY — 25 items
BLOCK BITE 60FR ADLT L/F BLUE (MISCELLANEOUS) ×3 IMPLANT
ELECT REM PT RETURN 9FT ADLT (ELECTROSURGICAL)
ELECTRODE REM PT RTRN 9FT ADLT (ELECTROSURGICAL) IMPLANT
FCP BXJMBJMB 240X2.8X (CUTTING FORCEPS)
FLOOR PAD 36X40 (MISCELLANEOUS) ×3
FORCEP RJ3 GP 1.8X160 W-NEEDLE (CUTTING FORCEPS) IMPLANT
FORCEPS BIOP RAD 4 LRG CAP 4 (CUTTING FORCEPS) ×2 IMPLANT
FORCEPS BIOP RJ4 240 W/NDL (CUTTING FORCEPS)
FORCEPS BXJMBJMB 240X2.8X (CUTTING FORCEPS) IMPLANT
INJECTOR/SNARE I SNARE (MISCELLANEOUS) IMPLANT
LUBRICANT JELLY 4.5OZ STERILE (MISCELLANEOUS) ×1 IMPLANT
MANIFOLD NEPTUNE II (INSTRUMENTS) ×1 IMPLANT
NDL SCLEROTHERAPY 25GX240 (NEEDLE) IMPLANT
NEEDLE SCLEROTHERAPY 25GX240 (NEEDLE) IMPLANT
PAD FLOOR 36X40 (MISCELLANEOUS) ×2 IMPLANT
PROBE APC STR FIRE (PROBE) IMPLANT
PROBE INJECTION GOLD (MISCELLANEOUS)
PROBE INJECTION GOLD 7FR (MISCELLANEOUS) IMPLANT
SNARE ROTATE MED OVAL 20MM (MISCELLANEOUS) IMPLANT
SNARE SHORT THROW 13M SML OVAL (MISCELLANEOUS) IMPLANT
SYR 50ML LL SCALE MARK (SYRINGE) ×1 IMPLANT
TRAP SPECIMEN MUCOUS 40CC (MISCELLANEOUS) IMPLANT
TUBING ENDO SMARTCAP PENTAX (MISCELLANEOUS) ×6 IMPLANT
TUBING IRRIGATION ENDOGATOR (MISCELLANEOUS) ×3 IMPLANT
WATER STERILE IRR 1000ML POUR (IV SOLUTION) ×2 IMPLANT

## 2011-06-10 NOTE — Anesthesia Preprocedure Evaluation (Addendum)
Anesthesia Evaluation  Patient identified by MRN, date of birth, ID band Patient awake    Reviewed: Allergy & Precautions, H&P , NPO status , Patient's Chart, lab work & pertinent test results  Airway Mallampati: II      Dental  (+) Teeth Intact   Pulmonary neg pulmonary ROS,    Pulmonary exam normal       Cardiovascular hypertension, Pt. on medications Regular Normal    Neuro/Psych  Headaches, PSYCHIATRIC DISORDERS Anxiety Depression    GI/Hepatic PUD,   Endo/Other    Renal/GU      Musculoskeletal   Abdominal   Peds  Hematology   Anesthesia Other Findings   Reproductive/Obstetrics                           Anesthesia Physical Anesthesia Plan  ASA: II  Anesthesia Plan: MAC   Post-op Pain Management:    Induction: Intravenous  Airway Management Planned: Nasal Cannula  Additional Equipment:   Intra-op Plan:   Post-operative Plan:   Informed Consent: I have reviewed the patients History and Physical, chart, labs and discussed the procedure including the risks, benefits and alternatives for the proposed anesthesia with the patient or authorized representative who has indicated his/her understanding and acceptance.     Plan Discussed with:   Anesthesia Plan Comments:         Anesthesia Quick Evaluation

## 2011-06-10 NOTE — Transfer of Care (Signed)
Immediate Anesthesia Transfer of Care Note  Patient: Kiannah A Sieloff  Procedure(s) Performed:  COLONOSCOPY WITH PROPOFOL - 0844 at cecum; procedure end at 0858; withdrawal time=28minutes; ESOPHAGOGASTRODUODENOSCOPY (EGD) WITH PROPOFOL - Procedure began at [phone]; with duodenal and gastric biopsies; POLYPECTOMY - Cecal polypectomy; Sigmoid colon polypectomies  Patient Location: PACU  Anesthesia Type: MAC  Level of Consciousness: awake, alert , oriented and patient cooperative  Airway & Oxygen Therapy: Patient Spontanous Breathing  Post-op Assessment: Report given to PACU RN, Post -op Vital signs reviewed and stable and Patient moving all extremities  Post vital signs: Reviewed and stable  Complications: No apparent anesthesia complications

## 2011-06-10 NOTE — Anesthesia Postprocedure Evaluation (Signed)
  Anesthesia Post-op Note  Patient: Latoya Decker  Procedure(s) Performed:  COLONOSCOPY WITH PROPOFOL - 0844 at cecum; procedure end at 0858; withdrawal time=60minutes; ESOPHAGOGASTRODUODENOSCOPY (EGD) WITH PROPOFOL - Procedure began at [phone]; with duodenal and gastric biopsies; POLYPECTOMY - Cecal polypectomy; Sigmoid colon polypectomies  Patient Location: PACU  Anesthesia Type: MAC  Level of Consciousness: awake, alert , oriented and patient cooperative  Airway and Oxygen Therapy: Patient Spontanous Breathing  Post-op Pain: none  Post-op Assessment: Post-op Vital signs reviewed, Patient's Cardiovascular Status Stable, Respiratory Function Stable and Patent Airway  Post-op Vital Signs: Reviewed and stable  Complications: No apparent anesthesia complications

## 2011-06-10 NOTE — Interval H&P Note (Signed)
History and Physical Interval Note:   06/10/2011   7:57 AM   Latoya Decker  has presented today for surgery, with the diagnosis of abd pain, hx of PUD & h-pylori  The various methods of treatment have been discussed with the patient and family. After consideration of risks, benefits and other options for treatment, the patient has consented to  Procedure(s): COLONOSCOPY WITH PROPOFOL ESOPHAGOGASTRODUODENOSCOPY (EGD) WITH PROPOFOL MALONEY DILATION SAVORY DILATION as a surgical intervention .  The patients' history has been reviewed, patient examined, no change in status, stable for surgery.  I have reviewed the patients' chart and labs.  Questions were answered to the patient's satisfaction.     Jonette Eva  MD

## 2011-06-13 ENCOUNTER — Telehealth: Payer: Self-pay | Admitting: Gastroenterology

## 2011-06-13 NOTE — Telephone Encounter (Signed)
Please call pt. She had HYPERPLASTIC POLYPS removed from her colon.HER stomach Bx shows mild gastritis. Continue OMP 30 minutes prior to meals TWICE DAILY. TCS in 5 years. High fiber diet. OPV IN 3 MOS.

## 2011-06-13 NOTE — Telephone Encounter (Signed)
LMOM to call.

## 2011-06-14 ENCOUNTER — Encounter (HOSPITAL_COMMUNITY): Payer: Self-pay | Admitting: Gastroenterology

## 2011-06-14 NOTE — Telephone Encounter (Signed)
Pt returned call and was informed.  

## 2011-06-17 NOTE — Telephone Encounter (Signed)
Pt is aware of OV on 2/27 at 0945 with [facility]

## 2011-06-18 NOTE — Telephone Encounter (Signed)
Results Cc to PCP  

## 2011-07-27 ENCOUNTER — Other Ambulatory Visit: Payer: Self-pay | Admitting: Family Medicine

## 2011-08-13 ENCOUNTER — Ambulatory Visit (INDEPENDENT_AMBULATORY_CARE_PROVIDER_SITE_OTHER): Payer: BC Managed Care – PPO | Admitting: Family Medicine

## 2011-08-13 ENCOUNTER — Encounter: Payer: Self-pay | Admitting: Family Medicine

## 2011-08-13 VITALS — BP 128/78 | HR 67 | Temp 98.4°F | Resp 18 | Ht 67.0 in | Wt 191.0 lb

## 2011-08-13 DIAGNOSIS — G43909 Migraine, unspecified, not intractable, without status migrainosus: Secondary | ICD-10-CM

## 2011-08-13 DIAGNOSIS — J069 Acute upper respiratory infection, unspecified: Secondary | ICD-10-CM

## 2011-08-13 MED ORDER — TOPIRAMATE 25 MG PO CPSP: 25.0000 mg | ORAL_CAPSULE | Freq: Two times a day (BID) | ORAL | Status: DC

## 2011-08-13 MED ORDER — AMOXICILLIN-POT CLAVULANATE 875-125 MG PO TABS: 1.0000 | ORAL_TABLET | Freq: Two times a day (BID) | ORAL | Status: AC

## 2011-08-13 MED ORDER — FLUTICASONE PROPIONATE 50 MCG/ACT NA SUSP: 1.0000 | Freq: Two times a day (BID) | NASAL | Status: DC | PRN

## 2011-08-13 NOTE — Progress Notes (Signed)
  Subjective:    Patient ID: Latoya Decker, female    DOB: August 09, 1960, 51 y.o.   MRN: [id number]  HPI Productive cough for past few days, sore throat x 2 weeks. Headache started this AM ,similar to her migraines with photophobia. Has been using Theraflu and tylenol which have not helped, has also had a lot of nasal drainage and facial pressure.   +sick contacts  Review of Systems GEN- denies fatigue, fever, weight loss,weakness, recent illness, +chills HEENT- denies eye drainage, change in vision,+ nasal discharge, +postnasal drip  CVS- denies chest pain, palpitations RESP- denies SOB, cough, wheeze MSK- denies joint pain, muscle aches, injury Neuro- + headache,denies dizziness, syncope, seizure activity       Objective:   Physical Exam GEN- NAD, alert and oriented x3, sick appearing HEENT- PERRL, EOMI, non injected sclera, pink conjunctiva, MMM, oropharynx injected, tonsilar hypertrophy, no exudate, clear thick rhinorrhea, turbinates enlarged and edematous,fundoscopic exam difficult secondary to photosensitivity but appears benign, TM clear bilat  Neck- Supple,  Nodes- shotty cervical LAD CVS- RRR, no murmur RESP-CTAB EXT- No edema Pulses- Radial, DP- 2+   Rapid strep neg      Assessment & Plan:   URI-based on 2 week history of pharyngitis and now compounding symptoms will treat even with neg strep test. Augmentin x 10 days to cover throat and sinuses  HA- history of migraines, likely set off by current illness. Topamax refilled. Fiorocet prn Headache

## 2011-08-13 NOTE — Patient Instructions (Signed)
You are being treated for upper respiratory infection- including sore throat and sinuses. Take the antibiotic as prescribed Use the nose spray Restart your Topamax for the Headache Continue your other medications Drink  Plenty of fluids

## 2011-09-11 ENCOUNTER — Ambulatory Visit: Payer: BC Managed Care – PPO | Admitting: Gastroenterology

## 2011-11-18 ENCOUNTER — Other Ambulatory Visit: Payer: Self-pay | Admitting: Family Medicine

## 2011-11-18 DIAGNOSIS — N63 Unspecified lump in unspecified breast: Secondary | ICD-10-CM

## 2011-11-22 ENCOUNTER — Ambulatory Visit (HOSPITAL_COMMUNITY)
Admission: RE | Admit: 2011-11-22 | Discharge: 2011-11-22 | Disposition: A | Discharge status: Home / Self Care | Payer: BC Managed Care – PPO | Source: Ambulatory Visit | Attending: Family Medicine | Admitting: Family Medicine

## 2011-11-22 DIAGNOSIS — N63 Unspecified lump in unspecified breast: Secondary | ICD-10-CM

## 2011-11-22 DIAGNOSIS — Z1231 Encounter for screening mammogram for malignant neoplasm of breast: Secondary | ICD-10-CM | POA: Insufficient documentation

## 2011-12-04 ENCOUNTER — Ambulatory Visit (INDEPENDENT_AMBULATORY_CARE_PROVIDER_SITE_OTHER): Payer: BC Managed Care – PPO | Admitting: Family Medicine

## 2011-12-04 ENCOUNTER — Encounter: Payer: Self-pay | Admitting: Family Medicine

## 2011-12-04 ENCOUNTER — Other Ambulatory Visit: Payer: Self-pay | Admitting: Family Medicine

## 2011-12-04 VITALS — BP 120/80 | HR 72 | Resp 16 | Ht 67.0 in | Wt 196.4 lb

## 2011-12-04 DIAGNOSIS — N898 Other specified noninflammatory disorders of vagina: Secondary | ICD-10-CM

## 2011-12-04 DIAGNOSIS — N939 Abnormal uterine and vaginal bleeding, unspecified: Secondary | ICD-10-CM

## 2011-12-04 DIAGNOSIS — N760 Acute vaginitis: Secondary | ICD-10-CM | POA: Insufficient documentation

## 2011-12-04 DIAGNOSIS — R109 Unspecified abdominal pain: Secondary | ICD-10-CM

## 2011-12-04 LAB — POCT URINALYSIS DIPSTICK
Bilirubin, UA: NEGATIVE
Glucose, UA: NEGATIVE
Ketones, UA: NEGATIVE
Spec Grav, UA: >=1.03

## 2011-12-04 MED ORDER — TRAMADOL HCL 50 MG PO TABS: 50.0000 mg | ORAL_TABLET | Freq: Four times a day (QID) | ORAL | Status: DC | PRN

## 2011-12-04 MED ORDER — TEMAZEPAM 30 MG PO CAPS: 30.0000 mg | ORAL_CAPSULE | Freq: Every evening | ORAL | Status: DC | PRN

## 2011-12-04 NOTE — Progress Notes (Signed)
  Subjective:    Patient ID: Latoya Decker, female    DOB: 1960/09/16, 51 y.o.   MRN: [id number]  HPI  Patient presents with vaginal spotting for the past 3 weeks. 3-4 weeks ago she had left sided abdominal pain this has pretty much resolved she still has occasional tinge of pain. She status post hysterectomy. She had spotting a week after she had the abdominal pain for couple of days then stop. Next week she had spotting again for another couple days. She denies heavy discharge, dysuria, rectal bleeding.   Review of Systems - per above   GEN- denies fatigue, fever, weight loss,weakness, recent illness HEENT- denies eye drainage, change in vision, nasal discharge, CVS- denies chest pain, palpitations RESP- denies SOB, cough, wheeze ABD- denies N/V, change in stools, +abd pain, GU- denies dysuria, hematuria, dribbling, incontinence MSK- denies joint pain, muscle aches, injury Neuro- denies headache, +dizziness ( 1 episode), syncope, seizure activity      Objective:   Physical Exam GEN- NAD, alert and oriented x3 CVS- RRR, no murmur RESP-CTAB ABD-NABS,soft, NT,ND , no CVA tenderness GU- external genitalia normal, normal vaginal cuff, thick white discharge noted, no bleeding noted,  EXT- No edema Pulses- Radial, DP- 2+        Assessment & Plan:

## 2011-12-04 NOTE — Patient Instructions (Signed)
We will call with results from your cultures today If you began to have heavy bleeding please call Your urine will also be sent off Medications refilled F/U as previous with Dr. Lodema Hong

## 2011-12-05 LAB — WET PREP BY MOLECULAR PROBE
Candida species: NEGATIVE
Gardnerella vaginalis: NEGATIVE
Trichomonas vaginosis: NEGATIVE

## 2011-12-05 NOTE — Assessment & Plan Note (Signed)
Pain has now resolved, UA only shows trace intact blood, culture will be obtained,no acute abdomen

## 2011-12-05 NOTE — Assessment & Plan Note (Signed)
Irritation of vaginal walls likley source of bleed, cultures sent, await results before treatment

## 2011-12-05 NOTE — Assessment & Plan Note (Signed)
S/p hysterectomy, no source of bleeding seen, UA shows trace blood, will send for culture

## 2011-12-10 ENCOUNTER — Encounter (HOSPITAL_COMMUNITY): Payer: Self-pay

## 2011-12-10 ENCOUNTER — Emergency Department (HOSPITAL_COMMUNITY)
Admission: EM | Admit: 2011-12-10 | Discharge: 2011-12-10 | Disposition: A | Discharge status: Home / Self Care | Payer: BC Managed Care – PPO | Attending: Emergency Medicine | Admitting: Emergency Medicine

## 2011-12-10 ENCOUNTER — Emergency Department (HOSPITAL_COMMUNITY): Payer: BC Managed Care – PPO

## 2011-12-10 DIAGNOSIS — Z79899 Other long term (current) drug therapy: Secondary | ICD-10-CM | POA: Insufficient documentation

## 2011-12-10 DIAGNOSIS — R42 Dizziness and giddiness: Secondary | ICD-10-CM | POA: Insufficient documentation

## 2011-12-10 DIAGNOSIS — I10 Essential (primary) hypertension: Secondary | ICD-10-CM | POA: Insufficient documentation

## 2011-12-10 DIAGNOSIS — H538 Other visual disturbances: Secondary | ICD-10-CM | POA: Insufficient documentation

## 2011-12-10 DIAGNOSIS — F341 Dysthymic disorder: Secondary | ICD-10-CM | POA: Insufficient documentation

## 2011-12-10 DIAGNOSIS — R51 Headache: Secondary | ICD-10-CM | POA: Insufficient documentation

## 2011-12-10 DIAGNOSIS — R110 Nausea: Secondary | ICD-10-CM | POA: Insufficient documentation

## 2011-12-10 DIAGNOSIS — Z8711 Personal history of peptic ulcer disease: Secondary | ICD-10-CM | POA: Insufficient documentation

## 2011-12-10 LAB — BASIC METABOLIC PANEL
CO2: 20 mEq/L (ref 19–32)
GFR calc non Af Amer: 70 mL/min — ABNORMAL LOW (ref >90)
Glucose, Bld: 103 mg/dL — ABNORMAL HIGH (ref 70–99)
Potassium: 3.6 mEq/L (ref 3.5–5.1)
Sodium: 139 mEq/L (ref 135–145)

## 2011-12-10 LAB — CBC
Platelets: 221 10*3/uL (ref 150–400)
RBC: 4.35 MIL/uL (ref 3.87–5.11)
RDW: 11.9 % (ref 11.5–15.5)
WBC: 6.1 10*3/uL (ref 4.0–10.5)

## 2011-12-10 LAB — DIFFERENTIAL
Basophils Absolute: 0 10*3/uL (ref 0.0–0.1)
Lymphocytes Relative: 36 % (ref 12–46)
Lymphs Abs: 2.2 10*3/uL (ref 0.7–4.0)
Neutrophils Relative %: 56 % (ref 43–77)

## 2011-12-10 LAB — TROPONIN I: Troponin I: <0.3 ng/mL (ref <0.30)

## 2011-12-10 MED ORDER — SODIUM CHLORIDE 0.9 % IV BOLUS (SEPSIS)
1000.0000 mL | Freq: Once | INTRAVENOUS | Status: AC
  Administered 2011-12-10: 1000 mL via INTRAVENOUS

## 2011-12-10 MED ORDER — ONDANSETRON HCL 4 MG/2ML IJ SOLN
4.0000 mg | Freq: Once | INTRAMUSCULAR | Status: AC
  Administered 2011-12-10: 4 mg via INTRAVENOUS
  Filled 2011-12-10: qty 2

## 2011-12-10 MED ORDER — DIAZEPAM 5 MG/ML IJ SOLN
5.0000 mg | Freq: Once | INTRAMUSCULAR | Status: AC
  Administered 2011-12-10: 09:00:00 via INTRAVENOUS
  Filled 2011-12-10: qty 2

## 2011-12-10 MED ORDER — MECLIZINE HCL 12.5 MG PO TABS
25.0000 mg | ORAL_TABLET | Freq: Once | ORAL | Status: AC
  Administered 2011-12-10: 25 mg via ORAL
  Filled 2011-12-10: qty 2

## 2011-12-10 MED ORDER — MECLIZINE HCL 12.5 MG PO TABS: 12.5000 mg | ORAL_TABLET | Freq: Three times a day (TID) | ORAL | Status: AC | PRN

## 2011-12-10 MED ORDER — ONDANSETRON HCL 4 MG PO TABS: 4.0000 mg | ORAL_TABLET | Freq: Four times a day (QID) | ORAL | Status: AC

## 2011-12-10 MED ORDER — LORAZEPAM 2 MG/ML IJ SOLN
1.0000 mg | Freq: Once | INTRAMUSCULAR | Status: AC
  Administered 2011-12-10: 1 mg via INTRAVENOUS
  Filled 2011-12-10: qty 1

## 2011-12-10 NOTE — ED Notes (Signed)
Pt gets a little dizzy while standings, but as she walks the dizziness gets better.

## 2011-12-10 NOTE — ED Provider Notes (Signed)
History  This chart was scribed for Latoya Octave, MD by Bennett Scrape. This patient was seen in room [facility] and the patient's care was started at 8:29AM.  CSN: [id number]  Arrival date & time 12/10/11  0820   First MD Initiated Contact with Patient 12/10/11 [id number]      Chief Complaint  Patient presents with  . Dizziness  . Nausea  . Headache    The history is provided by the patient. No language interpreter was used.    Latoya Decker is a 51 y.o. female who presents to the Emergency Department complaining of 3 days of gradual onset, gradually improving, intermittently felt HA with associated dizziness described as the room spinning, blurry vision and nausea. The symptoms are better with laying down and staying still. She reports previous episodes about 3 or 4 years ago diagnosed as vertigo. She has a h/o migraines and states that the HA she is experiencing now is less severe than previously experienced migraines. She denies weakness, numbness, cough and fever as associated symptoms. She has a h/o HTN, anxiety and depression. She denies smoking and alcohol use.    Past Medical History  Diagnosis Date  . Anxiety   . Depression   . Headache     migraines  . Duodenal ulcer 01/05/2010    EGD Dr Steffanie Dunn, h pylori gastritis, completed treatment  . Duodenitis   . Gastritis     hospitalized 6/23-6/25 2011   . PUD (peptic ulcer disease)     Dr Katrinka Blazing  . S/P colonoscopy 12/27/2003    Dr Casimiro Needle  . Family hx of colon cancer     Mother & sister  . Helicobacter pylori gastritis   . S/P endoscopy 12/27/2003    Dr Rosita Fire superficial ulcers GEJ, duodenitis,gastritis  . Hypertension     Past Surgical History  Procedure Date  . Bilateral tubal ligation 1989  . Partial hysterectomy 2007    1 ovary removed  . Cholecystectomy 1997    aph-Smith  . Tubal ligation   . Polypectomy 06/10/2011    Procedure: POLYPECTOMY;  Surgeon: Arlyce Harman, MD;  Location: [facility];   Service: Endoscopy;;  Cecal polypectomy; Sigmoid colon polypectomies  . Esophageal biopsy 06/10/2011    Procedure: BIOPSY;  Surgeon: Arlyce Harman, MD;  Location: [facility];  Service: Endoscopy;  Laterality: N/A;  Duodenal and Gastric Biopsies    Family History  Problem Relation Age of Onset  . Colon cancer Mother 57  . Diabetes Mother   . Hypertension Mother   . Stroke Mother   . Heart attack Father   . Diabetes Father   . Hypertension Father   . Colon cancer Sister 39  . Lupus Brother   . Parkinsonism Brother   . Anesthesia problems Neg Hx   . Hypotension Neg Hx   . Malignant hyperthermia Neg Hx   . Pseudochol deficiency Neg Hx     History  Substance Use Topics  . Smoking status: Never Smoker   . Smokeless tobacco: Not on file  . Alcohol Use: No     Review of Systems  A complete 10 system review of systems was obtained and all systems are negative except as noted in the HPI and PMH.    Allergies  Codeine and Hydrocodone  Home Medications   Current Outpatient Rx  Name Route Sig Dispense Refill  . BUTALBITAL-APAP-CAFFEINE 50-325-40 MG PO TABS  Take one tab by mouth at onset of headache and repeat in 4-6 hrs  if headache persists 50 tablet 3  . DESONIDE 0.05 % EX CREA Topical Apply 1 application topically 2 (two) times daily.      Marland Kitchen FLUOXETINE HCL 20 MG PO CAPS Oral Take 1 capsule (20 mg total) by mouth daily. 30 capsule 3  . FLUTICASONE PROPIONATE 50 MCG/ACT NA SUSP Nasal Place 1 spray into the nose 2 (two) times daily as needed for rhinitis. 16 g 2  . KETOCONAZOLE 2 % EX SHAM Topical Apply 1 application topically 2 (two) times a week.      Marland Kitchen OMEPRAZOLE 20 MG PO CPDR  1 po every morning 31 capsule 31  . TEMAZEPAM 30 MG PO CAPS Oral Take 1 capsule (30 mg total) by mouth at bedtime as needed. Restless leg 30 capsule 3  . TOPIRAMATE 25 MG PO CPSP Oral Take 1 capsule (25 mg total) by mouth 2 (two) times daily. Take 2 tablets by mouth at bedtime 60 capsule 3  . TRAMADOL HCL  50 MG PO TABS Oral Take 1 tablet (50 mg total) by mouth every 6 (six) hours as needed. For pain 60 tablet 1  . TRIAMTERENE-HCTZ 37.5-25 MG PO TABS Oral Take 1 tablet by mouth daily. 30 tablet 11  . VITAMIN D (ERGOCALCIFEROL) 50000 UNITS PO CAPS  TAKE 1 CAPSULE BY MOUTH ONCE A WEEK 4 capsule 2    Triage Vitals: BP 124/82  Pulse 94  Temp(Src) 98.6 F (37 C) (Oral)  Resp 18  Ht 5\' 7"  (1.702 m)  Wt 180 lb (81.647 kg)  BMI 28.19 kg/m2  SpO2 98%  Physical Exam  Nursing note and vitals reviewed. Constitutional: She is oriented to person, place, and time. She appears well-developed and well-nourished. No distress.  HENT:  Head: Normocephalic and atraumatic.  Eyes: Conjunctivae and EOM are normal.       No nystagmus  Neck: Normal range of motion. Neck supple. No tracheal deviation present.  Cardiovascular: Normal rate and regular rhythm.   Pulmonary/Chest: Effort normal and breath sounds normal. No respiratory distress.  Abdominal: Soft. She exhibits no distension.  Musculoskeletal: Normal range of motion. She exhibits no edema.       5/5 strength throughout, grip strength is equal  Neurological: She is alert and oriented to person, place, and time. No cranial nerve deficit.       No ataxia in finger to nose, head impulse test neg, test of skew negative  Skin: Skin is warm and dry.  Psychiatric: She has a normal mood and affect. Her behavior is normal.    ED Course  Procedures (including critical care time)  DIAGNOSTIC STUDIES: Oxygen Saturation is 98% on room air, normal by my interpretation.    COORDINATION OF CARE: 8:40AM-Discussed treatment plan which includes medications with pt and pt agreed to plan. 9:51AM-Pt rechecked and is feeling improved but not completely back to normal. 11:00AM-Pt rechecked and is ready to go home.   Labs Reviewed  BASIC METABOLIC PANEL - Abnormal; Notable for the following:    Glucose, Bld 103 (*)    GFR calc non Af Amer 70 (*)    GFR calc Af  Amer 81 (*)    All other components within normal limits  CBC  DIFFERENTIAL  TROPONIN I   Ct Head Wo Contrast  12/10/2011  *RADIOLOGY REPORT*  Clinical Data: Dizziness, nausea and headache.  CT HEAD WITHOUT CONTRAST  Technique:  Contiguous axial images were obtained from the base of the skull through the vertex without contrast.  Comparison: MRI  of the brain dated 03/22/2009  Findings: The brain demonstrates no evidence of hemorrhage, infarction, edema, mass effect, extra-axial fluid collection, hydrocephalus or mass lesion.  The skull is unremarkable.  IMPRESSION: Normal head CT.  Original Report Authenticated By: Reola Calkins, M.D.     No diagnosis found.    MDM  Gradual onset headache with nausea, room spinning and vertigo and dizziness. Similar symptoms 3 years ago but none since. No nystagmus, focal weakness, numbness or tingling. No ataxia finger to nose. Dizziness worse with position change.   Suspect peripheral vertigo. We'll treat with meclizine, benzodiazepines. Doubt Central cause of vertigo. Symptoms improved medications. Patient ambulatory without assistance.    Date: 12/10/2011  Rate: 81  Rhythm: normal sinus rhythm  QRS Axis: normal  Intervals: normal  ST/T Wave abnormalities: normal  Conduction Disutrbances:none  Narrative Interpretation:   Old EKG Reviewed: unchanged    I personally performed the services described in this documentation, which was scribed in my presence.  The recorded information has been reviewed and considered.        Latoya Octave, MD 12/10/11 1108

## 2011-12-10 NOTE — Discharge Instructions (Signed)

## 2011-12-10 NOTE — ED Notes (Signed)
Pt c/o headache along with dizziness and nausea that began over weekend.

## 2011-12-10 NOTE — ED Notes (Signed)
Pt requesting a banana bag, I informed the pt that the doctor has to order that and cannot just infuse a banana bag.

## 2012-08-31 ENCOUNTER — Encounter: Payer: Self-pay | Admitting: Family Medicine

## 2012-08-31 ENCOUNTER — Ambulatory Visit (INDEPENDENT_AMBULATORY_CARE_PROVIDER_SITE_OTHER): Payer: BC Managed Care – PPO | Admitting: Family Medicine

## 2012-08-31 VITALS — BP 114/78 | HR 94 | Resp 16 | Ht 67.0 in | Wt 199.4 lb

## 2012-08-31 DIAGNOSIS — E559 Vitamin D deficiency, unspecified: Secondary | ICD-10-CM

## 2012-08-31 DIAGNOSIS — E669 Obesity, unspecified: Secondary | ICD-10-CM

## 2012-08-31 DIAGNOSIS — I10 Essential (primary) hypertension: Secondary | ICD-10-CM

## 2012-08-31 DIAGNOSIS — G43909 Migraine, unspecified, not intractable, without status migrainosus: Secondary | ICD-10-CM

## 2012-08-31 DIAGNOSIS — G43809 Other migraine, not intractable, without status migrainosus: Secondary | ICD-10-CM | POA: Insufficient documentation

## 2012-08-31 MED ORDER — TOPIRAMATE 25 MG PO CPSP: 25.0000 mg | ORAL_CAPSULE | Freq: Two times a day (BID) | ORAL | Status: DC

## 2012-08-31 MED ORDER — BUTALBITAL-APAP-CAFFEINE 50-325-40 MG PO TABS: ORAL_TABLET | ORAL | Status: DC

## 2012-08-31 NOTE — Assessment & Plan Note (Signed)
20 lb weight gain, very little activity, discussed proper nutrition and activity

## 2012-08-31 NOTE — Progress Notes (Signed)
  Subjective:    Patient ID: Latoya Decker, female    DOB: [birth year], 52 y.o.   MRN: [id number]  HPI   Patient presents with headaches she's not been seen since May of 2013. She stopped all of her medications a few months ago. The past 24 over she had a throbbing headache associated with photophobia nausea without emesis. She also has some dizziness but this does not seem like her typical vertigo. She took ibuprofen which helped ease her headache this morning but she has still felt a little nauseous.  She stopped her blood pressure medicine a couple months ago she is overdue for fasting labs she is gaining 20 pounds since our last visit  She states a couple weeks ago she had some spotting in her urine and side pain but it resolved Review of Systems  GEN- denies fatigue, fever, weight loss,weakness, recent illness HEENT- denies eye drainage, change in vision, nasal discharge, CVS- denies chest pain, palpitations RESP- denies SOB, cough, wheeze ABD- denies N/V, change in stools, abd pain GU- denies dysuria, hematuria, dribbling, incontinence MSK- denies joint pain, muscle aches, injury Neuro- + headache, dizziness, syncope, seizure activity      Objective:   Physical Exam GEN- NAD, alert and oriented x3, repeat BP 120/74 HEENT- PERRL, EOMI, non injected sclera, pink conjunctiva, MMM, oropharynx clear, fundus clear, no papilledema, TM clear bilat  Neck- Supple, no bruit CVS- RRR, no murmur RESP-CTAB ABD-NABS,soft,NT,ND, no CVA tenderness EXT- No edema Pulses- Radial, DP- 2+ Neuro- CNII-XII intact, no focal deficits        Assessment & Plan:    Advised if she has any blood in urine, to call and schedule appt, could not give UA today

## 2012-08-31 NOTE — Assessment & Plan Note (Signed)
Doing well off medications, due for fasting labs, will not restart BP meds today

## 2012-08-31 NOTE — Patient Instructions (Addendum)
Use the firocet as needed for migraine, If they increase 1-2 a week, restart topamax at bedtime Get the fasting labs Keep an eye on your blood pressure  F/u 2 MONTHS Dr. Lodema Hong

## 2012-08-31 NOTE — Assessment & Plan Note (Signed)
Very rare headaches off topamax, current symptoms suggest migraine, now resolving, normal exam Refilled fioricet, instructed to restart topamax at bedtime as previous if having 1-2 HA a week

## 2012-09-09 LAB — BASIC METABOLIC PANEL
Potassium: 4.6 mEq/L (ref 3.5–5.3)
Sodium: 141 mEq/L (ref 135–145)

## 2012-09-09 LAB — LIPID PANEL
HDL: 43 mg/dL (ref >39)
LDL Cholesterol: 69 mg/dL (ref 0–99)
Total CHOL/HDL Ratio: 3.1 Ratio

## 2012-09-09 LAB — CBC
HCT: 39.1 % (ref 36.0–46.0)
Hemoglobin: 13.3 g/dL (ref 12.0–15.0)
MCV: 87.3 fL (ref 78.0–100.0)
RBC: 4.48 MIL/uL (ref 3.87–5.11)
WBC: 7 10*3/uL (ref 4.0–10.5)

## 2012-09-09 LAB — TSH: TSH: 0.366 u[IU]/mL (ref 0.350–4.500)

## 2012-10-19 ENCOUNTER — Other Ambulatory Visit: Payer: Self-pay | Admitting: Family Medicine

## 2012-10-19 DIAGNOSIS — IMO0001 Reserved for inherently not codable concepts without codable children: Secondary | ICD-10-CM

## 2012-10-20 ENCOUNTER — Encounter: Payer: Self-pay | Admitting: Family Medicine

## 2012-11-16 ENCOUNTER — Ambulatory Visit (INDEPENDENT_AMBULATORY_CARE_PROVIDER_SITE_OTHER): Payer: BC Managed Care – PPO | Admitting: Family Medicine

## 2012-11-16 ENCOUNTER — Encounter: Payer: Self-pay | Admitting: Family Medicine

## 2012-11-16 VITALS — BP 122/84 | HR 85 | Resp 16 | Wt 199.8 lb

## 2012-11-16 DIAGNOSIS — M542 Cervicalgia: Secondary | ICD-10-CM

## 2012-11-16 DIAGNOSIS — E669 Obesity, unspecified: Secondary | ICD-10-CM

## 2012-11-16 DIAGNOSIS — G43909 Migraine, unspecified, not intractable, without status migrainosus: Secondary | ICD-10-CM

## 2012-11-16 MED ORDER — IBUPROFEN 800 MG PO TABS: ORAL_TABLET | ORAL | Status: AC

## 2012-11-16 MED ORDER — PHENTERMINE HCL 37.5 MG PO TABS: 37.5000 mg | ORAL_TABLET | Freq: Every day | ORAL | Status: DC

## 2012-11-16 MED ORDER — METHYLPREDNISOLONE ACETATE 80 MG/ML IJ SUSP
80.0000 mg | Freq: Once | INTRAMUSCULAR | Status: AC
  Administered 2012-11-16: 80 mg via INTRAMUSCULAR

## 2012-11-16 MED ORDER — ESOMEPRAZOLE MAGNESIUM 40 MG PO CPDR: 40.0000 mg | DELAYED_RELEASE_CAPSULE | Freq: Every day | ORAL | Status: DC

## 2012-11-16 MED ORDER — TOPIRAMATE 50 MG PO TABS: ORAL_TABLET | ORAL | Status: DC

## 2012-11-16 MED ORDER — TIZANIDINE HCL 4 MG PO TABS: ORAL_TABLET | ORAL | Status: DC

## 2012-11-16 MED ORDER — KETOROLAC TROMETHAMINE 60 MG/2ML IM SOLN
60.0000 mg | Freq: Once | INTRAMUSCULAR | Status: AC
  Administered 2012-11-16: 60 mg via INTRAMUSCULAR

## 2012-11-16 NOTE — Patient Instructions (Addendum)
F/u in 3 month  You are being treated for acute neck spasm   Toradol 60mg  and depo medrol 80 mg iM in office, followed by ibuprofen, prednisone , zannaflex and nexium  Start phentermine half tablet daily, then increase to half twice daily after 1 week to help with appetite suppression and weight loss.  You will also get a 1500calorie diet  Please commit to exercise for at least 30 minutes 5 days per week  Weight loss goal of 3 to 4 pounds per month  Torticollis, Acute You have suddenly (acutely) developed a twisted neck (torticollis). This is usually a self-limited condition. CAUSES  Acute torticollis may be caused by malposition, trauma or infection. Most commonly, acute torticollis is caused by sleeping in an awkward position. Torticollis may also be caused by the flexion, extension or twisting of the neck muscles beyond their normal position. Sometimes, the exact cause may not be known. SYMPTOMS  Usually, there is pain and limited movement of the neck. Your neck may twist to one side. DIAGNOSIS  The diagnosis is often made by physical examination. X-rays, CT scans or MRIs may be done if there is a history of trauma or concern of infection. TREATMENT  For a common, stiff neck that develops during sleep, treatment is focused on relaxing the contracted neck muscle. Medications (including shots) may be used to treat the problem. Most cases resolve in several days. Torticollis usually responds to conservative physical therapy. If left untreated, the shortened and spastic neck muscle can cause deformities in the face and neck. Rarely, surgery is required. HOME CARE INSTRUCTIONS   Use over-the-counter and prescription medications as directed by your caregiver.  Do stretching exercises and massage the neck as directed by your caregiver.  Follow up with physical therapy if needed and as directed by your caregiver. SEEK IMMEDIATE MEDICAL CARE IF:   You develop difficulty breathing or noisy  breathing (stridor).  You drool, develop trouble swallowing or have pain with swallowing.  You develop numbness or weakness in the hands or feet.  You have changes in speech or vision.  You have problems with urination or bowel movements.  You have difficulty walking.  You have a fever.  You have increased pain. MAKE SURE YOU:   Understand these instructions.  Will watch your condition.  Will get help right away if you are not doing well or get worse. Document Released: 06/28/2000 Document Revised: 09/23/2011 Document Reviewed: 08/09/2009 [facility] Patient Information 2013 [place], [place].

## 2012-11-16 NOTE — Progress Notes (Signed)
  Subjective:    Patient ID: Latoya Decker, female    DOB: [birth year], 52 y.o.   MRN: [id number]  HPI 5 day h/o acute mid back pain and spasm radiating to left shoulder was a 10 could hardly breathe and problems moving her neck Concerned about 20 pound weight gain in past year, want s to start med to help with weight loss and will also work on lifestyle change to facilitate this Reports good headache control, and denies depression, states work is greatly improved   Review of Systems See HPI Denies recent fever or chills. Denies sinus pressure, nasal congestion, ear pain or sore throat. Denies chest congestion, productive cough or wheezing. Denies chest pains, palpitations and leg swelling Denies abdominal pain, nausea, vomiting,diarrhea or constipation.   Denies dysuria, frequency, hesitancy or incontinence.  Denies headaches, seizures, numbness, or tingling. Denies depression, anxiety or insomnia. Denies skin break down or rash.        Objective:   Physical Exam Patient alert and oriented and in no cardiopulmonary distress.Pt in pain  HEENT: No facial asymmetry, EOMI, no sinus tenderness,  oropharynx pink and moist.  Neck  Decreased ROM with left trapezius spasm, no adenopathy.No JVD  Chest: Clear to auscultation bilaterally.  CVS: S1, S2 no murmurs, no S3.  ABD: Soft non tender. Bowel sounds normal.  Ext: No edema  MS: decrease ROM cervical  Spine,adequate in  shoulders, hips and knees.  Skin: Intact, no ulcerations or rash noted.  Psych: Good eye contact, normal affect. Memory intact not anxious or depressed appearing.  CNS: CN 2-12 intact, power, tone and sensation normal throughout.        Assessment & Plan:

## 2012-11-23 ENCOUNTER — Ambulatory Visit (HOSPITAL_COMMUNITY): Payer: BC Managed Care – PPO

## 2012-11-24 ENCOUNTER — Ambulatory Visit (HOSPITAL_COMMUNITY)
Admission: RE | Admit: 2012-11-24 | Discharge: 2012-11-24 | Disposition: A | Discharge status: Home / Self Care | Payer: BC Managed Care – PPO | Source: Ambulatory Visit | Attending: Family Medicine | Admitting: Family Medicine

## 2012-11-24 DIAGNOSIS — IMO0001 Reserved for inherently not codable concepts without codable children: Secondary | ICD-10-CM

## 2012-11-24 DIAGNOSIS — Z1231 Encounter for screening mammogram for malignant neoplasm of breast: Secondary | ICD-10-CM | POA: Insufficient documentation

## 2012-11-29 NOTE — Assessment & Plan Note (Signed)
Acute neck spam, aggressive anti inflammatories and muscle relaxants, call back for PT if needed

## 2012-11-29 NOTE — Assessment & Plan Note (Signed)
Deteriorated. Patient re-educated about  the importance of commitment to a  minimum of 150 minutes of exercise per week. The importance of healthy food choices with portion control discussed. Encouraged to start a food diary, count calories and to consider  joining a support group. Sample diet sheets offered. Goals set by the patient for the next several months.    

## 2012-11-29 NOTE — Assessment & Plan Note (Signed)
Controlled, no change in medication  

## 2013-01-11 ENCOUNTER — Telehealth: Payer: Self-pay | Admitting: Family Medicine

## 2013-01-12 ENCOUNTER — Ambulatory Visit: Payer: BC Managed Care – PPO | Admitting: Family Medicine

## 2013-01-12 NOTE — Telephone Encounter (Signed)
Message left for pt to call front desk staff for appt this week, pls give pt an appt when she calls asap this week, blood pressure needs eval this is a true 15 minute visit

## 2013-01-13 ENCOUNTER — Encounter: Payer: Self-pay | Admitting: Family Medicine

## 2013-01-13 ENCOUNTER — Ambulatory Visit (INDEPENDENT_AMBULATORY_CARE_PROVIDER_SITE_OTHER): Payer: BC Managed Care – PPO | Admitting: Family Medicine

## 2013-01-13 VITALS — BP 146/90 | HR 100 | Resp 18 | Ht 65.5 in | Wt 192.0 lb

## 2013-01-13 DIAGNOSIS — A048 Other specified bacterial intestinal infections: Secondary | ICD-10-CM

## 2013-01-13 DIAGNOSIS — Z23 Encounter for immunization: Secondary | ICD-10-CM

## 2013-01-13 DIAGNOSIS — E669 Obesity, unspecified: Secondary | ICD-10-CM

## 2013-01-13 DIAGNOSIS — I10 Essential (primary) hypertension: Secondary | ICD-10-CM

## 2013-01-13 DIAGNOSIS — G43909 Migraine, unspecified, not intractable, without status migrainosus: Secondary | ICD-10-CM

## 2013-01-13 MED ORDER — AMLODIPINE BESYLATE 2.5 MG PO TABS: 2.5000 mg | ORAL_TABLET | Freq: Every day | ORAL | Status: DC

## 2013-01-13 NOTE — Telephone Encounter (Signed)
Patient scheduled for o/v today.

## 2013-01-13 NOTE — Patient Instructions (Addendum)
CPE on 8/19, please change appt duration  Blood presssure high , start amlodipine every day  congrats on exercise and weight loss keep it up   TdAP today

## 2013-01-23 ENCOUNTER — Other Ambulatory Visit: Payer: Self-pay | Admitting: Family Medicine

## 2013-01-24 NOTE — Assessment & Plan Note (Signed)
Uncontrolled, fioricet prescribed

## 2013-01-24 NOTE — Progress Notes (Signed)
  Subjective:    Patient ID: Latoya Decker, female    DOB: 26-Dec-1960, 52 y.o.   MRN: [id number]  HPI 2 day h/o headache and blurred vision and was told on the job that her blood pressure was too high. Had been on meds in the past but this was discontinued. Has been diligent in exercise and dietary change with weight loss   Review of Systems See HPI Denies recent fever or chills. Denies sinus pressure, nasal congestion, ear pain or sore throat. Denies chest congestion, productive cough or wheezing. Denies chest pains, palpitations and leg swelling Denies abdominal pain, nausea, vomiting,diarrhea or constipation.   Denies dysuria, frequency, hesitancy or incontinence. Denies joint pain, swelling and limitation in mobility. Denies seizures, numbness, or tingling.c/o blurred vision Denies depression, anxiety or insomnia. Denies skin break down or rash.        Objective:   Physical Exam  Patient alert and oriented and in no cardiopulmonary distress.  HEENT: No facial asymmetry, EOMI, no sinus tenderness,  oropharynx pink and moist.  Neck supple no adenopathy.Fudoscopy, no hemorhage or exudate, disc margin clear  Chest: Clear to auscultation bilaterally.  CVS: S1, S2 no murmurs, no S3.  ABD: Soft non tender. Bowel sounds normal.  Ext: No edema  MS: Adequate ROM spine, shoulders, hips and knees.  Skin: Intact, no ulcerations or rash noted.  Psych: Good eye contact, normal affect. Memory intact not anxious or depressed appearing.  CNS: CN 2-12 intact, power, tone and sensation normal throughout.       Assessment & Plan:

## 2013-01-24 NOTE — Assessment & Plan Note (Signed)
Pt asymptomatic on omeprazole she is to continue same

## 2013-01-24 NOTE — Assessment & Plan Note (Signed)
Uncontrolled, start amlodipine DASH diet and commitment to daily physical activity for a minimum of 30 minutes discussed and encouraged, as a part of hypertension management. The importance of attaining a healthy weight is also discussed.  

## 2013-01-24 NOTE — Assessment & Plan Note (Signed)
Improved. Pt applauded on succesful weight loss through lifestyle change, and encouraged to continue same. Weight loss goal set for the next several months.  

## 2013-03-02 ENCOUNTER — Ambulatory Visit: Payer: BC Managed Care – PPO | Admitting: Family Medicine

## 2013-03-03 ENCOUNTER — Other Ambulatory Visit (HOSPITAL_COMMUNITY)
Admission: RE | Admit: 2013-03-03 | Discharge: 2013-03-03 | Disposition: A | Discharge status: Home / Self Care | Payer: BC Managed Care – PPO | Source: Ambulatory Visit | Attending: Family Medicine | Admitting: Family Medicine

## 2013-03-03 ENCOUNTER — Ambulatory Visit (INDEPENDENT_AMBULATORY_CARE_PROVIDER_SITE_OTHER): Payer: BC Managed Care – PPO | Admitting: Family Medicine

## 2013-03-03 ENCOUNTER — Encounter: Payer: Self-pay | Admitting: Family Medicine

## 2013-03-03 VITALS — BP 128/80 | HR 82 | Resp 16 | Ht 65.5 in | Wt 192.0 lb

## 2013-03-03 DIAGNOSIS — Z1151 Encounter for screening for human papillomavirus (HPV): Secondary | ICD-10-CM | POA: Insufficient documentation

## 2013-03-03 DIAGNOSIS — I10 Essential (primary) hypertension: Secondary | ICD-10-CM

## 2013-03-03 DIAGNOSIS — K269 Duodenal ulcer, unspecified as acute or chronic, without hemorrhage or perforation: Secondary | ICD-10-CM

## 2013-03-03 DIAGNOSIS — N750 Cyst of Bartholin's gland: Secondary | ICD-10-CM

## 2013-03-03 DIAGNOSIS — Z1211 Encounter for screening for malignant neoplasm of colon: Secondary | ICD-10-CM

## 2013-03-03 DIAGNOSIS — Z1272 Encounter for screening for malignant neoplasm of vagina: Secondary | ICD-10-CM

## 2013-03-03 DIAGNOSIS — Z01419 Encounter for gynecological examination (general) (routine) without abnormal findings: Secondary | ICD-10-CM | POA: Insufficient documentation

## 2013-03-03 DIAGNOSIS — Z0000 Encounter for general adult medical examination without abnormal findings: Secondary | ICD-10-CM

## 2013-03-03 DIAGNOSIS — G43909 Migraine, unspecified, not intractable, without status migrainosus: Secondary | ICD-10-CM

## 2013-03-03 DIAGNOSIS — E669 Obesity, unspecified: Secondary | ICD-10-CM

## 2013-03-03 MED ORDER — PHENTERMINE HCL 37.5 MG PO TABS: 37.5000 mg | ORAL_TABLET | Freq: Every day | ORAL | Status: DC

## 2013-03-03 MED ORDER — OMEPRAZOLE MAGNESIUM 20 MG PO TBEC: 20.0000 mg | DELAYED_RELEASE_TABLET | Freq: Every day | ORAL | Status: DC

## 2013-03-03 NOTE — Progress Notes (Signed)
  Subjective:    Patient ID: Latoya Decker, female    DOB: [birth year], 52 y.o.   MRN: [id number]  HPI Pt in for annual exam. She reports that she is well, is tolerating all n medications, including phentermine, and has no adverse side effects. She is comited to regular exercise ans well as healthy eating and is pleased , and to be commended, on her weight loss Immunization and cabncer screening tests are up to date   Review of Systems See HPI Denies recent fever or chills. Denies sinus pressure, nasal congestion, ear pain or sore throat. Denies chest congestion, productive cough or wheezing. Denies chest pains, palpitations and leg swelling Denies abdominal pain, nausea, vomiting,diarrhea or constipation.   Denies dysuria, frequency, hesitancy or incontinence. Denies joint pain, swelling and limitation in mobility. Denies headaches, seizures, numbness, or tingling. Denies depression, anxiety or insomnia. Denies skin break down or rash.        Objective:   Physical Exam  Pleasant well nourished female, alert and oriented x 3, in no cardio-pulmonary distress. Afebrile. HEENT No facial trauma or asymetry. Sinuses non tender.  EOMI, PERTL, fundoscopic exam  no hemorhage or exudate.  External ears normal, tympanic membranes clear. Oropharynx moist, no exudate, good dentition. Neck: supple, no adenopathy,JVD or thyromegaly.No bruits.  Chest: Clear to ascultation bilaterally.No crackles or wheezes. Non tender to palpation  Breast: No asymetry,no masses. No nipple discharge or inversion. No axillary or supraclavicular adenopathy  Cardiovascular system; Heart sounds normal,  S1 and  S2 ,no S3.  No murmur, or thrill. Apical beat not displaced Peripheral pulses normal.  Abdomen: Soft, non tender, no organomegaly or masses. No bruits. Bowel sounds normal. No guarding, tenderness or rebound.  Rectal:  No mass. Guaiac negative stool.  GU: External genitalia normal.  Cystic lesion on inner aspect of right labia majora, max diameter approx 2cm Vaginal canal normal.Physiologic  discharge. Uterus absent , no adnexal masses, no  adnexal tenderness.  Musculoskeletal exam: Full ROM of spine, hips , shoulders and knees. No deformity ,swelling or crepitus noted. No muscle wasting or atrophy.   Neurologic: Cranial nerves 2 to 12 intact. Power, tone ,sensation and reflexes normal throughout. No disturbance in gait. No tremor.  Skin: Intact, no ulceration, erythema , scaling or rash noted. Pigmentation normal throughout  Psych; Normal mood and affect. Judgement and concentration normal       Assessment & Plan:

## 2013-03-03 NOTE — Patient Instructions (Addendum)
F/u in 4 month, call if you need me before  You are referred to female gynecologist in [place]. Re cyst   Pls stay focused on regular physical activity and healthy food choices  Weight loss goal of 2 to 2.5 pounds per month  I am happy that you are doing well

## 2013-03-07 DIAGNOSIS — Z0000 Encounter for general adult medical examination without abnormal findings: Secondary | ICD-10-CM | POA: Insufficient documentation

## 2013-03-07 NOTE — Assessment & Plan Note (Signed)
Controlled, no change in medication DASH diet and commitment to daily physical activity for a minimum of 30 minutes discussed and encouraged, as a part of hypertension management. The importance of attaining a healthy weight is also discussed.  

## 2013-03-07 NOTE — Assessment & Plan Note (Signed)
Improved. Pt applauded on succesful weight loss through lifestyle change, and encouraged to continue same. Weight loss goal set for the next several months.  

## 2013-03-07 NOTE — Assessment & Plan Note (Signed)
Annual exam as documented. Pt feels well with no complaints, and is committed to healthy lifestyle with good success to date.  Medications are reviewed, and remain small in number, her headaches are well controlled with topamax  Acting as an excellent preventive med with marked reduced frequency

## 2013-03-07 NOTE — Assessment & Plan Note (Signed)
Controlled, no change in medication  

## 2013-03-07 NOTE — Assessment & Plan Note (Signed)
Refer to gyne for eval

## 2013-05-19 ENCOUNTER — Emergency Department (HOSPITAL_COMMUNITY)
Admission: EM | Admit: 2013-05-19 | Discharge: 2013-05-19 | Disposition: A | Discharge status: Home / Self Care | Payer: BC Managed Care – PPO | Attending: Emergency Medicine | Admitting: Emergency Medicine

## 2013-05-19 ENCOUNTER — Encounter (HOSPITAL_COMMUNITY): Payer: Self-pay | Admitting: Emergency Medicine

## 2013-05-19 DIAGNOSIS — R52 Pain, unspecified: Secondary | ICD-10-CM | POA: Insufficient documentation

## 2013-05-19 DIAGNOSIS — Z8619 Personal history of other infectious and parasitic diseases: Secondary | ICD-10-CM | POA: Insufficient documentation

## 2013-05-19 DIAGNOSIS — R42 Dizziness and giddiness: Secondary | ICD-10-CM | POA: Insufficient documentation

## 2013-05-19 DIAGNOSIS — Z79899 Other long term (current) drug therapy: Secondary | ICD-10-CM | POA: Insufficient documentation

## 2013-05-19 DIAGNOSIS — I10 Essential (primary) hypertension: Secondary | ICD-10-CM | POA: Insufficient documentation

## 2013-05-19 DIAGNOSIS — F329 Major depressive disorder, single episode, unspecified: Secondary | ICD-10-CM | POA: Insufficient documentation

## 2013-05-19 DIAGNOSIS — R110 Nausea: Secondary | ICD-10-CM | POA: Insufficient documentation

## 2013-05-19 DIAGNOSIS — F411 Generalized anxiety disorder: Secondary | ICD-10-CM | POA: Insufficient documentation

## 2013-05-19 DIAGNOSIS — Z9889 Other specified postprocedural states: Secondary | ICD-10-CM | POA: Insufficient documentation

## 2013-05-19 DIAGNOSIS — Z8719 Personal history of other diseases of the digestive system: Secondary | ICD-10-CM | POA: Insufficient documentation

## 2013-05-19 DIAGNOSIS — R51 Headache: Secondary | ICD-10-CM | POA: Insufficient documentation

## 2013-05-19 DIAGNOSIS — F3289 Other specified depressive episodes: Secondary | ICD-10-CM | POA: Insufficient documentation

## 2013-05-19 MED ORDER — PROMETHAZINE HCL 25 MG PO TABS: 25.0000 mg | ORAL_TABLET | Freq: Four times a day (QID) | ORAL | Status: DC | PRN

## 2013-05-19 MED ORDER — KETOROLAC TROMETHAMINE 60 MG/2ML IM SOLN
60.0000 mg | Freq: Once | INTRAMUSCULAR | Status: AC
  Administered 2013-05-19: 60 mg via INTRAMUSCULAR
  Filled 2013-05-19: qty 2

## 2013-05-19 MED ORDER — ONDANSETRON 8 MG PO TBDP
8.0000 mg | ORAL_TABLET | Freq: Once | ORAL | Status: AC
  Administered 2013-05-19: 8 mg via ORAL
  Filled 2013-05-19: qty 1

## 2013-05-19 MED ORDER — TRAMADOL HCL 50 MG PO TABS: 50.0000 mg | ORAL_TABLET | Freq: Four times a day (QID) | ORAL | Status: DC | PRN

## 2013-05-19 NOTE — ED Notes (Signed)
Pt alert & oriented x4, stable gait. Patient given discharge instructions, paperwork & prescription(s). Patient  instructed to stop at the registration desk to finish any additional paperwork. Patient verbalized understanding. Pt left department w/ no further questions. 

## 2013-05-19 NOTE — ED Notes (Signed)
Pt reports headache that started last night, nausea, no vomiting or diarrhea, no fever. Dizzy at times and generalized body aches.

## 2013-05-19 NOTE — ED Provider Notes (Signed)
CSN: [id number]     Arrival date & time 05/19/13  1246 History   First MD Initiated Contact with Patient 05/19/13 1603     Chief Complaint  Patient presents with  . Headache  . Generalized Body Aches  . Nausea  . Dizziness   (Consider location/radiation/quality/duration/timing/severity/associated sxs/prior Treatment) HPI..... generalized headache with associated nausea for the past 24 hours. Patient thinks she might have a virus at work. Also complains of general achiness. No fever, sweats, chills, stiff neck, dysuria severity is mild to moderate.  Past Medical History  Diagnosis Date  . Anxiety   . Depression   . Headache(784.0)     migraines  . Duodenal ulcer 01/05/2010    EGD Dr Steffanie Dunn, h pylori gastritis, completed treatment  . Duodenitis   . Gastritis     hospitalized 6/23-6/25 2011   . PUD (peptic ulcer disease)     Dr Katrinka Blazing  . S/P colonoscopy 12/27/2003    Dr Casimiro Needle  . Family hx of colon cancer     Mother & sister  . Helicobacter pylori gastritis   . S/P endoscopy 12/27/2003    Dr Rosita Fire superficial ulcers GEJ, duodenitis,gastritis  . Hypertension    Past Surgical History  Procedure Laterality Date  . Bilateral tubal ligation  1989  . Partial hysterectomy  2007    1 ovary removed  . Cholecystectomy  1997    aph-Smith  . Tubal ligation    . Polypectomy  06/10/2011    Procedure: POLYPECTOMY;  Surgeon: Arlyce Harman, MD;  Location: [facility];  Service: Endoscopy;;  Cecal polypectomy; Sigmoid colon polypectomies  . Esophageal biopsy  06/10/2011    Procedure: BIOPSY;  Surgeon: Arlyce Harman, MD;  Location: [facility];  Service: Endoscopy;  Laterality: N/A;  Duodenal and Gastric Biopsies  . Abdominal hysterectomy     Family History  Problem Relation Age of Onset  . Colon cancer Mother 78  . Diabetes Mother   . Hypertension Mother   . Stroke Mother   . Heart attack Father   . Diabetes Father   . Hypertension Father   . Colon cancer Sister 11  . Lupus  Brother   . Parkinsonism Brother   . Anesthesia problems Neg Hx   . Hypotension Neg Hx   . Malignant hyperthermia Neg Hx   . Pseudochol deficiency Neg Hx    History  Substance Use Topics  . Smoking status: Never Smoker   . Smokeless tobacco: Not on file  . Alcohol Use: No   OB History   Grav Para Term Preterm Abortions TAB SAB Ect Mult Living                 Review of Systems  Unable to perform ROS   Allergies  Codeine and Hydrocodone  Home Medications   Current Outpatient Rx  Name  Route  Sig  Dispense  Refill  . amLODipine (NORVASC) 2.5 MG tablet   Oral   Take 1 tablet (2.5 mg total) by mouth daily.   30 tablet   11   . butalbital-acetaminophen-caffeine (FIORICET) 50-325-40 MG per tablet      Take one tab by mouth at onset of headache and repeat in 4-6 hrs if headache persists   30 tablet   3   . omeprazole (PRILOSEC OTC) 20 MG tablet   Oral   Take 1 tablet (20 mg total) by mouth daily.   28 tablet   3   . phentermine (ADIPEX-P) 37.5 MG  tablet   Oral   Take 1 tablet (37.5 mg total) by mouth daily before breakfast.   30 tablet   1   . topiramate (TOPAMAX) 25 MG tablet   Oral   Take 25 mg by mouth daily.         . promethazine (PHENERGAN) 25 MG tablet   Oral   Take 1 tablet (25 mg total) by mouth every 6 (six) hours as needed for nausea or vomiting.   20 tablet   0   . traMADol (ULTRAM) 50 MG tablet   Oral   Take 1 tablet (50 mg total) by mouth every 6 (six) hours as needed.   20 tablet   0    BP 121/67  Pulse 62  Temp(Src) 97.7 F (36.5 C) (Oral)  Resp 20  Ht 5\' 7"  (1.702 m)  Wt 180 lb (81.647 kg)  BMI 28.19 kg/m2  SpO2 99% Physical Exam  Nursing note and vitals reviewed. Constitutional: She is oriented to person, place, and time. She appears well-developed and well-nourished.  HENT:  Head: Normocephalic and atraumatic.  Eyes: Conjunctivae and EOM are normal. Pupils are equal, round, and reactive to light.  Neck: Normal range of  motion. Neck supple.  Cardiovascular: Normal rate, regular rhythm and normal heart sounds.   Pulmonary/Chest: Effort normal and breath sounds normal.  Abdominal: Soft. Bowel sounds are normal.  Musculoskeletal: Normal range of motion.  Neurological: She is alert and oriented to person, place, and time.  Skin: Skin is warm and dry.  Psychiatric: She has a normal mood and affect.    ED Course  Procedures (including critical care time) Labs Review Labs Reviewed - No data to display Imaging Review No results found.  EKG Interpretation   None       MDM   1. Headache    Patient is nontoxic. No neuro deficits. IM Toradol and by mouth Zofran given.   Discharge medications Ultram and Phenergan 25 mg    Donnetta Hutching, MD 05/19/13 1722

## 2013-07-01 ENCOUNTER — Encounter (INDEPENDENT_AMBULATORY_CARE_PROVIDER_SITE_OTHER): Payer: Self-pay

## 2013-07-01 ENCOUNTER — Ambulatory Visit (INDEPENDENT_AMBULATORY_CARE_PROVIDER_SITE_OTHER): Payer: BC Managed Care – PPO | Admitting: Family Medicine

## 2013-07-01 ENCOUNTER — Encounter: Payer: Self-pay | Admitting: Gastroenterology

## 2013-07-01 ENCOUNTER — Ambulatory Visit: Payer: BC Managed Care – PPO | Admitting: Family Medicine

## 2013-07-01 ENCOUNTER — Ambulatory Visit (HOSPITAL_COMMUNITY)
Admission: RE | Admit: 2013-07-01 | Discharge: 2013-07-01 | Disposition: A | Discharge status: Home / Self Care | Payer: BC Managed Care – PPO | Source: Ambulatory Visit | Attending: Family Medicine | Admitting: Family Medicine

## 2013-07-01 ENCOUNTER — Encounter: Payer: Self-pay | Admitting: Family Medicine

## 2013-07-01 VITALS — BP 138/82 | HR 91 | Resp 16 | Wt 186.4 lb

## 2013-07-01 DIAGNOSIS — K269 Duodenal ulcer, unspecified as acute or chronic, without hemorrhage or perforation: Secondary | ICD-10-CM

## 2013-07-01 DIAGNOSIS — G4700 Insomnia, unspecified: Secondary | ICD-10-CM | POA: Insufficient documentation

## 2013-07-01 DIAGNOSIS — R109 Unspecified abdominal pain: Secondary | ICD-10-CM

## 2013-07-01 DIAGNOSIS — Z8711 Personal history of peptic ulcer disease: Secondary | ICD-10-CM | POA: Insufficient documentation

## 2013-07-01 DIAGNOSIS — G43909 Migraine, unspecified, not intractable, without status migrainosus: Secondary | ICD-10-CM

## 2013-07-01 DIAGNOSIS — I10 Essential (primary) hypertension: Secondary | ICD-10-CM

## 2013-07-01 DIAGNOSIS — E669 Obesity, unspecified: Secondary | ICD-10-CM

## 2013-07-01 DIAGNOSIS — Z1211 Encounter for screening for malignant neoplasm of colon: Secondary | ICD-10-CM

## 2013-07-01 LAB — COMPREHENSIVE METABOLIC PANEL
AST: 12 U/L (ref 0–37)
Albumin: 4.3 g/dL (ref 3.5–5.2)
BUN: 10 mg/dL (ref 6–23)
Calcium: 8.9 mg/dL (ref 8.4–10.5)
Chloride: 109 mEq/L (ref 96–112)
Glucose, Bld: 88 mg/dL (ref 70–99)
Potassium: 4 mEq/L (ref 3.5–5.3)

## 2013-07-01 LAB — LIPASE: Lipase: <10 U/L (ref 0–75)

## 2013-07-01 LAB — POC HEMOCCULT BLD/STL (OFFICE/1-CARD/DIAGNOSTIC)

## 2013-07-01 MED ORDER — TOPIRAMATE 100 MG PO TABS: 100.0000 mg | ORAL_TABLET | Freq: Every day | ORAL | Status: AC

## 2013-07-01 MED ORDER — OMEPRAZOLE 40 MG PO CPDR: 40.0000 mg | DELAYED_RELEASE_CAPSULE | Freq: Every day | ORAL | Status: DC

## 2013-07-01 MED ORDER — PROMETHAZINE HCL 25 MG PO TABS: 25.0000 mg | ORAL_TABLET | Freq: Four times a day (QID) | ORAL | Status: DC | PRN

## 2013-07-01 MED ORDER — SUCRALFATE 1 GM/10ML PO SUSP: 1.0000 g | Freq: Four times a day (QID) | ORAL | Status: DC

## 2013-07-01 MED ORDER — TEMAZEPAM 30 MG PO CAPS: 30.0000 mg | ORAL_CAPSULE | Freq: Every evening | ORAL | Status: DC | PRN

## 2013-07-01 NOTE — Assessment & Plan Note (Signed)
Current symptoms suggestive of repeat gastritis at least, pt to start omeprazole 20mg  twice daily and carafate and d/c NSAIDS Will be referred to GI also

## 2013-07-01 NOTE — Assessment & Plan Note (Signed)
Controlled, no change in medication DASH diet and commitment to daily physical activity for a minimum of 30 minutes discussed and encouraged, as a part of hypertension management. The importance of attaining a healthy weight is also discussed.  

## 2013-07-01 NOTE — Assessment & Plan Note (Signed)
Deteriorated with stress x 1 monht, resume restoril, sleep hygiene is already good

## 2013-07-01 NOTE — Progress Notes (Signed)
   Subjective:    Patient ID: Latoya Decker, female    DOB: September 15, 1960, 52 y.o.   MRN: [id number]  HPI The PT is here for follow up and re-evaluation of chronic medical conditions, medication management and review of any available recent lab and radiology data.  Preventive health is updated, specifically  Cancer screening and Immunization.   . The PT denies any adverse reactions to current medications since the last visit.  3 day h/o abdominal pain primarily central and RUQ, pain is as much as an 8 at times, aggravated by eating. No visible blood in stool , no black stool, no hematemesis. She is also experiencing nausea Has been under increased work stress in the past month, several co workers quitting so she has had to be working for longer hours, she is sleep deprived and experiencing between 3 to 4 headaches per week, which she has been using ibuprofen in excess for. She has had success with appetite suppression with phentermine and wishes to continue the drug      Review of Systems See HPI Denies recent fever or chills. Denies sinus pressure, nasal congestion, ear pain or sore throat. Denies chest congestion, productive cough or wheezing. Denies chest pains, palpitations and leg swelling  Denies dysuria, frequency, hesitancy or incontinence. Denies joint pain, swelling and limitation in mobility. Denies , seizures, numbness, or tingling. Denies depression, does have increased  anxiety and insomnia. Denies skin break down or rash.        Objective:   Physical Exam Patient alert and oriented and in no cardiopulmonary distress.  HEENT: No facial asymmetry, EOMI, no sinus tenderness,  oropharynx pink and moist.  Neck supple no adenopathy.  Chest: Clear to auscultation bilaterally.  CVS: S1, S2 no murmurs, no S3.  ABD: Soft epigastric and RUQ tenderness, no guarding or rebound.no palpable organomegaly or mass, bowel sounds are normal Rectal soft heme negative  stool Ext: No edema  MS: Adequate ROM spine, shoulders, hips and knees.  Skin: Intact, no ulcerations or rash noted.  Psych: Good eye contact, normal affect. Memory intact not anxious or depressed appearing.  CNS: CN 2-12 intact, power, tone and sensation normal throughout.        Assessment & Plan:

## 2013-07-01 NOTE — Assessment & Plan Note (Addendum)
Uncontrolled, increased in past 1 montrh, due to work stress with poor sleep, increase dose topamax, and pt to use either tylenol or fioricet, no NSAID

## 2013-07-01 NOTE — Assessment & Plan Note (Signed)
Improved. Pt applauded on succesful weight loss through lifestyle change, and encouraged to continue same. Weight loss goal set for the next several months.  

## 2013-07-01 NOTE — Patient Instructions (Signed)
F/u in 4 month, call  If you need me before  Abdominal pain is due to overuse of ibuprofen and stomach irritation I Believe  Only tylenol or fioricet for headache please  Increase topamax to 100mg  at bedtime  Reduce stress and anxiety.Start 30 mins exercise daily  Start restoil to help with sleep  New for stomach is omeprazole 40mg  and carafate, alsoget labs and xray today please   If pain worsens go to ED [facility] diet increasing in solids as tolerated  Diet for Diarrhea, Adult Frequent, runny stools (diarrhea) may be caused or worsened by food or drink. Diarrhea may be relieved by changing your diet. Since diarrhea can last up to 7 days, it is easy for you to lose too much fluid from the body and become dehydrated. Fluids that are lost need to be replaced. Along with a modified diet, make sure you drink enough fluids to keep your urine clear or pale yellow. DIET INSTRUCTIONS  Ensure adequate fluid intake (hydration): have 1 cup (8 oz) of fluid for each diarrhea episode. Avoid fluids that contain simple sugars or sports drinks, fruit juices, whole milk products, and sodas. Your urine should be clear or pale yellow if you are drinking enough fluids. Hydrate with an oral rehydration solution that you can purchase at pharmacies, retail stores, and online. You can prepare an oral rehydration solution at home by mixing the following ingredients together:    tsp table salt.   tsp baking soda.   tsp salt substitute containing potassium chloride.  1  tablespoons sugar.  1 L (34 oz) of water.  Certain foods and beverages may increase the speed at which food moves through the gastrointestinal (GI) tract. These foods and beverages should be avoided and include:  Caffeinated and alcoholic beverages.  High-fiber foods, such as raw fruits and vegetables, nuts, seeds, and whole grain breads and cereals.  Foods and beverages sweetened with sugar alcohols, such as xylitol, sorbitol, and  mannitol.  Some foods may be well tolerated and may help thicken stool including:  Starchy foods, such as rice, toast, pasta, low-sugar cereal, oatmeal, grits, baked potatoes, crackers, and bagels.   Bananas.   Applesauce.  Add probiotic-rich foods to help increase healthy bacteria in the GI tract, such as yogurt and fermented milk products. RECOMMENDED FOODS AND BEVERAGES Starches Choose foods with less than 2 g of fiber per serving.  Recommended:  White, [place], and pita breads, plain rolls, buns, bagels. Plain muffins, matzo. Soda, saltine, or graham crackers. Pretzels, melba toast, zwieback. Cooked cereals made with water: cornmeal, farina, cream cereals. Dry cereals: refined corn, wheat, rice. Potatoes prepared any way without skins, refined macaroni, spaghetti, noodles, refined rice.  Avoid:  Bread, rolls, or crackers made with whole wheat, multi-grains, rye, bran seeds, nuts, or coconut. Corn tortillas or taco shells. Cereals containing whole grains, multi-grains, bran, coconut, nuts, raisins. Cooked or dry oatmeal. Coarse wheat cereals, granola. Cereals advertised as "high-fiber." Potato skins. Whole grain pasta, wild or brown rice. Popcorn. Sweet potatoes, yams. Sweet rolls, doughnuts, waffles, pancakes, sweet breads. Vegetables  Recommended: Strained tomato and vegetable juices. Most well-cooked and canned vegetables without seeds. Fresh: Tender lettuce, cucumber without the skin, cabbage, spinach, bean sprouts.  Avoid: Fresh, cooked, or canned: Artichokes, baked beans, beet greens, broccoli, Brussels sprouts, corn, kale, legumes, peas, sweet potatoes. Cooked: Green or red cabbage, spinach. Avoid large servings of any vegetables because vegetables shrink when cooked, and they contain more fiber per serving than fresh vegetables. Fruit  Recommended: Cooked or canned: Apricots, applesauce, cantaloupe, cherries, fruit cocktail, grapefruit, grapes, kiwi, mandarin oranges, peaches,  pears, plums, watermelon. Fresh: Apples without skin, ripe banana, grapes, cantaloupe, cherries, grapefruit, peaches, oranges, plums. Keep servings limited to  cup or 1 piece.  Avoid: Fresh: Apples with skin, apricots, mangoes, pears, raspberries, strawberries. Prune juice, stewed or dried prunes. Dried fruits, raisins, dates. Large servings of all fresh fruits. Protein  Recommended: Ground or well-cooked tender beef, ham, veal, lamb, pork, or poultry. Eggs. Fish, oysters, shrimp, lobster, other seafoods. Liver, organ meats.  Avoid: Tough, fibrous meats with gristle. Peanut butter, smooth or chunky. Cheese, nuts, seeds, legumes, dried peas, beans, lentils. Dairy  Recommended: Yogurt, lactose-free milk, kefir, drinkable yogurt, buttermilk, soy milk, or plain hard cheese.  Avoid: Milk, chocolate milk, beverages made with milk, such as milkshakes. Soups  Recommended: Bouillon, broth, or soups made from allowed foods. Any strained soup.  Avoid: Soups made from vegetables that are not allowed, cream or milk-based soups. Desserts and Sweets  Recommended: Sugar-free gelatin, sugar-free frozen ice pops made without sugar alcohol.  Avoid: Plain cakes and cookies, pie made with fruit, pudding, custard, cream pie. Gelatin, fruit, ice, sherbet, frozen ice pops. Ice cream, ice milk without nuts. Plain hard candy, honey, jelly, molasses, syrup, sugar, chocolate syrup, gumdrops, marshmallows. Fats and Oils  Recommended: Limit fats to less than 8 tsp per day.  Avoid: Seeds, nuts, olives, avocados. Margarine, butter, cream, mayonnaise, salad oils, plain salad dressings. Plain gravy, crisp bacon without rind. Beverages  Recommended: Water, decaffeinated teas, oral rehydration solutions, sugar-free beverages not sweetened with sugar alcohols.  Avoid: Fruit juices, caffeinated beverages (coffee, tea, soda), alcohol, sports drinks, or lemon-lime soda. Condiments  Recommended: Ketchup, mustard,  horseradish, vinegar, cocoa powder. Spices in moderation: allspice, basil, bay leaves, celery powder or leaves, cinnamon, cumin powder, curry powder, ginger, mace, marjoram, onion or garlic powder, oregano, paprika, parsley flakes, ground pepper, rosemary, sage, savory, tarragon, thyme, turmeric.  Avoid: Coconut, honey. Document Released: 09/21/2003 Document Revised: 03/25/2012 Document Reviewed: 11/15/2011 [facility] Patient Information 2014 [place], [place].

## 2013-07-15 DIAGNOSIS — K5281 Eosinophilic gastritis or gastroenteritis: Secondary | ICD-10-CM | POA: Insufficient documentation

## 2013-07-15 HISTORY — DX: Eosinophilic gastritis or gastroenteritis: K52.81

## 2013-07-20 ENCOUNTER — Encounter (INDEPENDENT_AMBULATORY_CARE_PROVIDER_SITE_OTHER): Payer: Self-pay

## 2013-07-20 ENCOUNTER — Encounter: Payer: Self-pay | Admitting: Gastroenterology

## 2013-07-20 ENCOUNTER — Ambulatory Visit (INDEPENDENT_AMBULATORY_CARE_PROVIDER_SITE_OTHER): Payer: BC Managed Care – PPO | Admitting: Gastroenterology

## 2013-07-20 VITALS — BP 133/82 | HR 83 | Temp 97.3°F | Wt 183.0 lb

## 2013-07-20 DIAGNOSIS — R197 Diarrhea, unspecified: Secondary | ICD-10-CM

## 2013-07-20 DIAGNOSIS — K269 Duodenal ulcer, unspecified as acute or chronic, without hemorrhage or perforation: Secondary | ICD-10-CM

## 2013-07-20 DIAGNOSIS — R1013 Epigastric pain: Secondary | ICD-10-CM

## 2013-07-20 MED ORDER — OMEPRAZOLE 40 MG PO CPDR: 40.0000 mg | DELAYED_RELEASE_CAPSULE | Freq: Two times a day (BID) | ORAL | Status: DC

## 2013-07-20 NOTE — Progress Notes (Signed)
Primary Care Physician:  [name], MD  Primary Gastroenterologist:  Barney Drain, MD   Chief Complaint  Patient presents with  . Abdominal Pain    when she eats    HPI:  Latoya Decker is a 53 y.o. female here for further evaluation of postprandial abdominal pain and loose stool. Symptoms started 06/28/13. Stomach acting up real bad. Lots of abdominal pain. Takes a lot of ibuprofen for migraines. About 2-3 daily for the past couple of months. Denies ASA powders. Symptoms became very progressive. Could not eating anything. Food going right through, diarrhea. Stomach hurt when eats. Pain in mid-abdomen. No vomiting. Has some nausea. No heartburn. Prior to this was having daily normal bowel movements. No melena, brbpr. Since on carafate and omeprazole, has had 1-2 days of feeling ok. BM 3-4 per day. Mostly drinking liquids only. No ill contacts. No recent antibiotic use. Occasional solid stool. Weight down few more pounds but she is on phentermine.    Vitals - 1 value per visit 07/20/2013 07/01/2013 05/19/2013 03/03/2013 01/13/2013  Weight (lb) 183 186.4 180 192 192.04   Vitals - 1 value per visit 11/16/2012 08/31/2012  Weight (lb) 199.8 199.4   Current Outpatient Prescriptions  Medication Sig Dispense Refill  . omeprazole (PRILOSEC) 40 MG capsule Take 1 capsule (40 mg total) by mouth daily.  30 capsule  1  . promethazine (PHENERGAN) 25 MG tablet Take 1 tablet (25 mg total) by mouth every 6 (six) hours as needed for nausea or vomiting.  20 tablet  0  . sucralfate (CARAFATE) 1 GM/10ML suspension Take 10 mLs (1 g total) by mouth 4 (four) times daily.  420 mL  1  . temazepam (RESTORIL) 30 MG capsule Take 1 capsule (30 mg total) by mouth at bedtime as needed for sleep.  30 capsule  4  . topiramate (TOPAMAX) 100 MG tablet Take 1 tablet (100 mg total) by mouth daily.  30 tablet  5  . phentermine (ADIPEX-P) 37.5 MG tablet Take 1 tablet (37.5 mg total) by mouth daily before breakfast.  30 tablet  1    No current facility-administered medications for this visit.    Allergies as of 07/20/2013 - Review Complete 07/20/2013  Allergen Reaction Noted  . Codeine Nausea And Vomiting 06/30/2008  . Hydrocodone Nausea And Vomiting 01/12/2010    Past Medical History  Diagnosis Date  . Anxiety   . Depression   . Headache(784.0)     migraines  . Duodenal ulcer 01/05/2010    EGD Dr Dierdre Searles, h pylori gastritis, completed treatment  . Duodenitis   . Gastritis     hospitalized 6/23-6/25 2011   . PUD (peptic ulcer disease)     Dr Tamala Julian  . S/P colonoscopy 12/27/2003    Dr Voncille Lo  . Family hx of colon cancer     Mother & sister  . Helicobacter pylori gastritis   . S/P endoscopy 12/27/2003    Dr Boston Service superficial ulcers GEJ, duodenitis,gastritis  . Hypertension     Past Surgical History  Procedure Laterality Date  . Bilateral tubal ligation  1989  . Partial hysterectomy  2007    1 ovary removed  . Cholecystectomy  1997    aph-Smith  . Tubal ligation    . Polypectomy  06/10/2011    Procedure: POLYPECTOMY;  Surgeon: Dorothyann Peng, MD;  Location: [facility];  Service: Endoscopy;;  Cecal polypectomy; Sigmoid colon polypectomies  . Esophageal biopsy  06/10/2011    SLF: mild gastritis, bx benign,  sb bx benign   . Abdominal hysterectomy    . Colonoscopy  05/2011    SLF: multiple hyperplastic sessile polyps, scattered diverticulosis. next TCS 05/2016 ([facility])    Family History  Problem Relation Age of Onset  . Colon cancer Mother 10  . Diabetes Mother   . Hypertension Mother   . Stroke Mother   . Heart attack Father   . Diabetes Father   . Hypertension Father   . Colon cancer Sister 81  . Lupus Brother   . Parkinsonism Brother   . Anesthesia problems Neg Hx   . Hypotension Neg Hx   . Malignant hyperthermia Neg Hx   . Pseudochol deficiency Neg Hx     History   Social History  . Marital Status: Married    Spouse Name: N/A    Number of Children: 3  . Years of  Education: N/A   Occupational History  . Dialysis Tech    Social History Main Topics  . Smoking status: Never Smoker   . Smokeless tobacco: Not on file  . Alcohol Use: No  . Drug Use: No  . Sexual Activity: Yes    Partners: Male    Birth Control/ Protection: Surgical   Other Topics Concern  . Not on file   Social History Narrative  . No narrative on file      ROS:  General: Negative for anorexia, weight loss, fever, chills, fatigue, weakness.see hpi Eyes: Negative for vision changes.  ENT: Negative for hoarseness, difficulty swallowing , nasal congestion. CV: Negative for chest pain, angina, palpitations, dyspnea on exertion, peripheral edema.  Respiratory: Negative for dyspnea at rest, dyspnea on exertion, cough, sputum, wheezing.  GI: See history of present illness. GU:  Negative for dysuria, hematuria, urinary incontinence, urinary frequency, nocturnal urination.  MS: Negative for joint pain, low back pain.  Derm: Negative for rash or itching.  Neuro: Negative for weakness, abnormal sensation, seizure, frequent headaches, memory loss, confusion.  Psych: Negative for anxiety, depression, suicidal ideation, hallucinations.  Endo: Negative for unusual weight change. See hpi. Heme: Negative for bruising or bleeding. Allergy: Negative for rash or hives.    Physical Examination:  BP 133/82  Pulse 83  Temp(Src) 97.3 F (36.3 C) (Oral)  Wt 183 lb (83.008 kg)   General: Well-nourished, well-developed in no acute distress.  Head: Normocephalic, atraumatic.   Eyes: Conjunctiva pink, no icterus. Mouth: Oropharyngeal mucosa moist and pink , no lesions erythema or exudate. Neck: Supple without thyromegaly, masses, or lymphadenopathy.  Lungs: Clear to auscultation bilaterally.  Heart: Regular rate and rhythm, no murmurs rubs or gallops.  Abdomen: Bowel sounds are normal, mild epigastric tenderness and just above the umbilicus, nondistended, no hepatosplenomegaly or masses,  no abdominal bruits or    hernia , no rebound or guarding.   Rectal: not performed. Recently heme negative per PCP Extremities: No lower extremity edema. No clubbing or deformities.  Neuro: Alert and oriented x 4 , grossly normal neurologically.  Skin: Warm and dry, no rash or jaundice.   Psych: Alert and cooperative, normal mood and affect.  Labs: Lab Results  Component Value Date   ALT 8 07/01/2013   AST 12 07/01/2013   ALKPHOS 130* 07/01/2013   BILITOT 0.4 07/01/2013   Lab Results  Component Value Date   CREATININE 0.92 07/01/2013   BUN 10 07/01/2013   NA 142 07/01/2013   K 4.0 07/01/2013   CL 109 07/01/2013   CO2 24 07/01/2013   Lab Results  Component Value Date   LIPASE <10 07/01/2013   Lab Results  Component Value Date   AMYLASE 46 07/01/2013      Imaging Studies: Dg Abd 2 Views  07/01/2013   CLINICAL DATA:  History of peptic ulcer disease, now with abdominal pain for the preceding 2 days.  EXAM: ABDOMEN - 2 VIEW  COMPARISON:  None.  FINDINGS: The bowel gas pattern may reflect constipation. There is no evidence of ileus nor of obstruction. There are surgical clips in the gallbladder fossa. The bony structures appear normal. No abnormal calcifications are demonstrated or along the expected course of the ureters nor over the kidneys.  IMPRESSION: The bowel gas pattern suggests constipation. There is no evidence of ileus nor obstruction or perforation.   Electronically Signed   By: David  Martinique   On: 07/01/2013 09:35

## 2013-07-20 NOTE — Assessment & Plan Note (Addendum)
Three week h/o pp epigastric/midabdominal pain associated with loose stools. No melena, brbpr. She has been taking modest amount of ibuprofen recently but also with significant PMH of duodenal ulcers/gastritis. ?etiology of diarrhea?   Check stool studies. Increase omeprazole to 40mg  BID for four weeks only, for possible gastritis/PUD. Continue Carafate. Gastritis diet. Handout provided. CT Abd/pelvis with contrast for further evaluation of abdominal pain given severity and lack of improvement with PPI.  Ultimately she may need repeat EGD. She is very anxious due to family history of colon cancer, mother/sister. Tried to provide reassurance given last TCS was 05/2011.

## 2013-07-20 NOTE — Patient Instructions (Signed)
1. Increase omeprazole to 40 mg twice daily before your breakfast and evening meal. Do this for the next 4 weeks and then resume at once daily. 2. Please collect stool specimens. 3. CT scan as discussed. 4. For now, consume a bland diet. See below for details.  Gastritis, Adult Gastritis is soreness and puffiness (inflammation) of the lining of the stomach. If you do not get help, gastritis can cause bleeding and sores (ulcers) in the stomach. HOME CARE   Only take medicine as told by your doctor.  If you were given antibiotic medicines, take them as told. Finish the medicines even if you start to feel better.  Drink enough fluids to keep your pee (urine) clear or pale yellow.  Avoid foods and drinks that make your problems worse. Foods you may want to avoid include:  Caffeine or alcohol.  Chocolate.  Mint.  Garlic and onions.  Spicy foods.  Citrus fruits, including oranges, lemons, or limes.  Food containing tomatoes, including sauce, chili, salsa, and pizza.  Fried and fatty foods.  Eat small meals throughout the day instead of large meals. GET HELP RIGHT AWAY IF:   You have black or dark red poop (stools).  You throw up (vomit) blood. It may look like coffee grounds.  You cannot keep fluids down.  Your belly (abdominal) pain gets worse.  You have a fever.  You do not feel better after 1 week.  You have any other questions or concerns. MAKE SURE YOU:   Understand these instructions.  Will watch your condition.  Will get help right away if you are not doing well or get worse. Document Released: 12/18/2007 Document Revised: 09/23/2011 Document Reviewed: 08/14/2011 [facility] Patient Information 2014 [place].

## 2013-07-21 NOTE — Progress Notes (Signed)
cc'd to pcp 

## 2013-07-23 ENCOUNTER — Ambulatory Visit (HOSPITAL_COMMUNITY)
Admission: RE | Admit: 2013-07-23 | Discharge: 2013-07-23 | Disposition: A | Discharge status: Home / Self Care | Payer: BC Managed Care – PPO | Source: Ambulatory Visit | Attending: Gastroenterology | Admitting: Gastroenterology

## 2013-07-23 DIAGNOSIS — R1013 Epigastric pain: Secondary | ICD-10-CM

## 2013-07-23 DIAGNOSIS — Z8719 Personal history of other diseases of the digestive system: Secondary | ICD-10-CM | POA: Insufficient documentation

## 2013-07-23 DIAGNOSIS — R109 Unspecified abdominal pain: Secondary | ICD-10-CM | POA: Insufficient documentation

## 2013-07-23 DIAGNOSIS — K269 Duodenal ulcer, unspecified as acute or chronic, without hemorrhage or perforation: Secondary | ICD-10-CM

## 2013-07-23 DIAGNOSIS — R197 Diarrhea, unspecified: Secondary | ICD-10-CM

## 2013-07-23 MED ORDER — IOHEXOL 300 MG/ML  SOLN
100.0000 mL | Freq: Once | INTRAMUSCULAR | Status: AC | PRN
  Administered 2013-07-23: 100 mL via INTRAVENOUS

## 2013-07-23 MED ORDER — SODIUM CHLORIDE 0.9 % IJ SOLN
INTRAMUSCULAR | Status: AC
  Filled 2013-07-23: qty 500

## 2013-07-26 ENCOUNTER — Telehealth: Payer: Self-pay | Admitting: Family Medicine

## 2013-07-26 ENCOUNTER — Telehealth: Payer: Self-pay

## 2013-07-26 LAB — GIARDIA/CRYPTOSPORIDIUM (EIA)
Cryptosporidium Screen (EIA): NEGATIVE
GIARDIA SCREEN (EIA): NEGATIVE

## 2013-07-26 LAB — CLOSTRIDIUM DIFFICILE BY PCR: CDIFFPCR: NOT DETECTED

## 2013-07-26 MED ORDER — DICYCLOMINE HCL 10 MG PO CAPS: 10.0000 mg | ORAL_CAPSULE | Freq: Four times a day (QID) | ORAL | Status: DC | PRN

## 2013-07-26 NOTE — Telephone Encounter (Signed)
Please let patient know her stool studies are negative so far.  CT ok, does not show cause of her diarrhea or abdominal pain.   Let's start her on bentyl for abd pain and loose stools. Continue omeprazole hopefully get approved for BID dosing soon. She can take OTC equivalent of 40mg  BID until then.  Please let patient know, I will discuss with Dr. Oneida Alar. Likely needs EGD, but may need TCS as well.

## 2013-07-26 NOTE — Progress Notes (Addendum)
REVIEWED med records from 2005 to present. INITIAL CONSULT FOR RUQ ABDOMINAL PAIN IN 2011.  PT WEIGHED 186 LBS. EGD/TCS 2012 FOR ABD PAIN/DIARRHEA-GASTRITIS, NL DUO Bx. SINCE 2005, PT HAS HAD 3 EGDs, AND 2 TCS. RECENT CT ABD/PELVIS-JAN 9 NAIAP. ABD PAIN AND DIARRHEA LIKELY RELATED TO BILE SALT INDUCED DIARRHEA, MEDS (IBUPROFEN, PHENTERMINE, LESS LIKELY CARAFATE) & STRESS. STOP IBUPROFEN, PHENTERMINE AND CARAFATE. STRICTLY ADHERE TO LOW FAT DIET.  PT SHOULD SEE A MENTAL HEALTH SPECIALIST. CALL IN 2 WEEKS IF ABD PAIN AND DIARRHEA ARE NOT IMPROVED WOULD CONSIDER EGD FOR DYSPEPSIA, AND TCS FOR MICROSCOPIC COLITIS.

## 2013-07-26 NOTE — Telephone Encounter (Signed)
Still in severe pain from her stomach and gastro are running tests but she is still in pain and cannot work. Wants to know if she can have a work note for today through fri. She is only able to drink fluids because food hurts her stomach so bad and she is having bad diarrhea. She is having the colonscopy and also the EGD as soon as its scheduled. Please advise

## 2013-07-26 NOTE — Telephone Encounter (Signed)
Patient aware and letter typed

## 2013-07-26 NOTE — Telephone Encounter (Signed)
Pls contact pt in am , I have left her 2 msg, I need to discuss her time out of work, pls get me to phone when she calls, IMPT

## 2013-07-26 NOTE — Telephone Encounter (Signed)
Called and informed pt.  

## 2013-07-26 NOTE — Telephone Encounter (Signed)
REVIEWED.  

## 2013-07-26 NOTE — Telephone Encounter (Signed)
Called and left message for patient to return call.  

## 2013-07-26 NOTE — Telephone Encounter (Signed)
Pt called and said she is still having abdominal pain everytime she eats. She is trying to eat light, but it still hurts. She can drink liquids and no problem, so she is staying hydrated. Said she has lost about 10 lbs since she was here at her OV last week.   She also said she could not get her Omeprazole at [facility]. I called the pharmacist and spoke to Daisy who said it requires a PA and he will fax over the form. ( I called pt back and told her that).   Please advise for the abdominal pain.

## 2013-07-26 NOTE — Telephone Encounter (Signed)
Ok to write work note , I see where she has seen GI on the 9th, hopefully she will get the procedure this week, I will also alert the GI Doc of the need to keep her out for the entire week pls let her know this also

## 2013-07-26 NOTE — Progress Notes (Signed)
Please see [facility] input. Highlights as noted  "STOP IBUPROFEN, PHENTERMINE AND CARAFATE. STRICTLY ADHERE TO LOW FAT DIET. PT SHOULD SEE A MENTAL HEALTH SPECIALIST. CALL IN 2 WEEKS IF ABD PAIN AND DIARRHEA ARE NOT IMPROVED WOULD CONSIDER EGD FOR DYSPEPSIA, AND TCS FOR MICROSCOPIC COLITIS."

## 2013-07-27 ENCOUNTER — Other Ambulatory Visit: Payer: Self-pay | Admitting: Family Medicine

## 2013-07-27 ENCOUNTER — Telehealth: Payer: Self-pay | Admitting: Family Medicine

## 2013-07-27 DIAGNOSIS — R109 Unspecified abdominal pain: Secondary | ICD-10-CM

## 2013-07-27 LAB — STOOL CULTURE

## 2013-07-27 NOTE — Telephone Encounter (Addendum)
REVIEWED. PLEASE LET us KNOW HOW WE MAY FACILITATE THE TRANSFER OF CARE OR PT MAY HAVE EGD JAN 14.

## 2013-07-27 NOTE — Telephone Encounter (Signed)
Pls refer pt GI in [place] asap, she c/o abdominal pain, states everything she eats causes pain. Requesting 2nd GI opinion. I did explain to her that Dr Nona Dell opinion was that she needs to address mental health issues and stop carattae, phentermine, and ibuprofen and that her primary opinion was iBS diarrheah predominant. I further explained the reality of stress in all of our lives and its greater impact on the GI system in pt's with IBS. She said respectfully she really does not believe this is the problem, abdominal pain is even waking her from sleep. She has had no upper endo since 05/2011, I think she should have one with the complaint she has . I offered an appt with Dr Oneida Alar following this discussion, she said she would rather a 2nd opinion at this point. I am attempting to get a GI appt for her asap with another provider, and I will make Dr Oneida Alar aware  Of what the situation is

## 2013-07-28 ENCOUNTER — Encounter (HOSPITAL_COMMUNITY)
Admission: RE | Disposition: A | Discharge status: Home / Self Care | Payer: Self-pay | Source: Ambulatory Visit | Attending: Gastroenterology

## 2013-07-28 ENCOUNTER — Telehealth: Payer: Self-pay | Admitting: Gastroenterology

## 2013-07-28 ENCOUNTER — Ambulatory Visit (HOSPITAL_COMMUNITY)
Admission: RE | Admit: 2013-07-28 | Discharge: 2013-07-28 | Disposition: A | Discharge status: Home / Self Care | Payer: BC Managed Care – PPO | Source: Ambulatory Visit | Attending: Gastroenterology | Admitting: Gastroenterology

## 2013-07-28 ENCOUNTER — Other Ambulatory Visit: Payer: Self-pay | Admitting: Gastroenterology

## 2013-07-28 ENCOUNTER — Telehealth: Payer: Self-pay | Admitting: Family Medicine

## 2013-07-28 ENCOUNTER — Encounter (HOSPITAL_COMMUNITY): Payer: Self-pay

## 2013-07-28 DIAGNOSIS — R1013 Epigastric pain: Secondary | ICD-10-CM

## 2013-07-28 DIAGNOSIS — K2990 Gastroduodenitis, unspecified, without bleeding: Principal | ICD-10-CM

## 2013-07-28 DIAGNOSIS — K2970 Gastritis, unspecified, without bleeding: Secondary | ICD-10-CM | POA: Insufficient documentation

## 2013-07-28 DIAGNOSIS — R1011 Right upper quadrant pain: Secondary | ICD-10-CM

## 2013-07-28 DIAGNOSIS — I10 Essential (primary) hypertension: Secondary | ICD-10-CM | POA: Insufficient documentation

## 2013-07-28 HISTORY — PX: ESOPHAGOGASTRODUODENOSCOPY: SHX5428

## 2013-07-28 SURGERY — EGD (ESOPHAGOGASTRODUODENOSCOPY)
Anesthesia: Moderate Sedation

## 2013-07-28 MED ORDER — PROMETHAZINE HCL 25 MG/ML IJ SOLN
INTRAMUSCULAR | Status: DC | PRN
  Administered 2013-07-28: 12.5 mg via INTRAVENOUS

## 2013-07-28 MED ORDER — STERILE WATER FOR IRRIGATION IR SOLN
Status: DC | PRN
  Administered 2013-07-28: 14:00:00

## 2013-07-28 MED ORDER — SODIUM CHLORIDE 0.9 % IV SOLN
INTRAVENOUS | Status: DC
  Administered 2013-07-28: 13:00:00 via INTRAVENOUS

## 2013-07-28 MED ORDER — LIDOCAINE VISCOUS 2 % MT SOLN: OROMUCOSAL | Status: DC

## 2013-07-28 MED ORDER — MIDAZOLAM HCL 5 MG/5ML IJ SOLN
INTRAMUSCULAR | Status: AC
  Filled 2013-07-28: qty 10

## 2013-07-28 MED ORDER — MEPERIDINE HCL 100 MG/ML IJ SOLN
INTRAMUSCULAR | Status: DC | PRN
  Administered 2013-07-28 (×2): 50 mg via INTRAVENOUS

## 2013-07-28 MED ORDER — SODIUM CHLORIDE 0.9 % IJ SOLN
INTRAMUSCULAR | Status: AC
  Filled 2013-07-28: qty 10

## 2013-07-28 MED ORDER — PROMETHAZINE HCL 25 MG/ML IJ SOLN
INTRAMUSCULAR | Status: AC
  Filled 2013-07-28: qty 1

## 2013-07-28 MED ORDER — MIDAZOLAM HCL 5 MG/5ML IJ SOLN
INTRAMUSCULAR | Status: DC | PRN
  Administered 2013-07-28 (×2): 2 mg via INTRAVENOUS

## 2013-07-28 MED ORDER — MEPERIDINE HCL 100 MG/ML IJ SOLN
INTRAMUSCULAR | Status: DC
Start: 2013-07-28 — End: 2013-07-28
  Filled 2013-07-28: qty 2

## 2013-07-28 NOTE — Telephone Encounter (Signed)
Called patient TO DISCUSS CONCERNS. EGD PLANNED FOR ~1 PM TODAY.

## 2013-07-28 NOTE — Op Note (Signed)
[place], [id number]   ENDOSCOPY PROCEDURE REPORT  PATIENT: Latoya, Decker  MR#: [id number] BIRTHDATE: [birth year] , 52  yrs. old GENDER: Female  ENDOSCOPIST: Barney Drain, MD REFERRED LK:[id number] Moshe Cipro, M.D.  PROCEDURE DATE: 07/28/2013 PROCEDURE:   EGD w/ biopsy  INDICATIONS:abdominal pain in the upper right quadrant.   Epigastric pain. MEDICATIONS: Demerol 100 mg IV, Versed 5 mg IV, and Promethazine (Phenergan) 12.5mg  IV TOPICAL ANESTHETIC:   Cetacaine Spray  DESCRIPTION OF PROCEDURE:     Physical exam was performed.  Informed consent was obtained from the patient after explaining the benefits, risks, and alternatives to the procedure.  The patient was connected to the monitor and placed in the left lateral position.  Continuous oxygen was provided by nasal cannula and IV medicine administered through an indwelling cannula.  After administration of sedation, the patients esophagus was intubated and the EG-[id number] ([id number])  endoscope was advanced under direct visualization to the second portion of the duodenum.  The scope was removed slowly by carefully examining the color, texture, anatomy, and integrity of the mucosa on the way out.  The patient was recovered in endoscopy and discharged home in satisfactory condition.   ESOPHAGUS: The mucosa of the esophagus appeared normal.   STOMACH: Moderate non-erosive gastritis (inflammation) was found in the gastric antrum and gastric body.  Multiple biopsies were performed using cold forceps.   DUODENUM: The duodenal mucosa showed no abnormalities in the bulb and second portion of the duodenum.  Cold forcep biopsies were taken in the second portion.  COMPLICATIONS:   None  ENDOSCOPIC IMPRESSION: 1.   UPPER ABDOMINAL PAIN MOST LIKELY DUE TO gastritis DUE TO IBUPROFEN, LESS LIKELY H PYLORI GASTRITIS. LOOSE STOOLS MOST LIKELY DUE TO PHENTERMINE. SYMPTOMS LESS LIKELY DUE TO  EOSINOPHILIC GE, OR CELIAC SPRUE. 2.   MODERTAE Non-erosive gastritis  RECOMMENDATIONS: DRINK WATER TO KEEP YOUR URINE LIGHT YELLOW. FOLLOW A LOW FAT DIET. DO NOT USE IBUPROFEN OR ALEVE.  CONTINUE TO HOLD CARAFATE AND PHENTERMINE. CONTINUE OMEPRAZOLE.  TAKE 30 MINUTES PRIOR TO YOUR MEALS TWICE DAILY. USE BENTYL AS NEEDED FOR DIARRHEA.  IT CAN CAUSE CONSTIPATION. CONTINUE WEIGHT LOSS EFFORTS. BIOPSY WILL BE BACK IN 7 DAYS. FOLLOW UP IN ONE MONTH.   REPEAT EXAM:   _______________________________ Lorrin MaisBarney Drain, MD 07/28/2013 3:10 PM       PATIENT NAME:  Latoya Decker, Latoya Decker MR#: [id number]

## 2013-07-28 NOTE — H&P (Addendum)
Primary Care Physician:  [name], MD Primary Gastroenterologist:  Dr. Oneida Alar  Pre-Procedure History & Physical: HPI:  Latoya Decker is a 53 y.o. female here for  ABDOMINAL PAIN/DYSPEPSIA. WEIGHT NOW DOWN TO 174 LBS?. WAS IN IBUPROFEN THEN PHENTERMINE THEN CARAFATE. HAD A SOLID STOOL THIS AM.  Past Medical History  Diagnosis Date  . Anxiety   . Depression   . Headache(784.0)     migraines  . Duodenal ulcer 01/05/2010    EGD Dr Dierdre Searles, h pylori gastritis, completed treatment  . Duodenitis   . Gastritis     hospitalized 6/23-6/25 2011   . PUD (peptic ulcer disease)     Dr Tamala Julian  . S/P colonoscopy 12/27/2003    Dr Voncille Lo  . Family hx of colon cancer     Mother & sister  . Helicobacter pylori gastritis   . S/P endoscopy 12/27/2003    Dr [facility] superficial ulcers GEJ, duodenitis,gastritis  . Hypertension     Past Surgical History  Procedure Laterality Date  . Bilateral tubal ligation  1989  . Partial hysterectomy  2007    1 ovary removed  . Cholecystectomy  1997    aph-Smith  . Tubal ligation    . Polypectomy  06/10/2011    Procedure: POLYPECTOMY;  Surgeon: Dorothyann Peng, MD;  Location: [facility];  Service: Endoscopy;;  Cecal polypectomy; Sigmoid colon polypectomies  . Esophageal biopsy  06/10/2011    [facility]: mild gastritis, bx benign, sb bx benign   . Abdominal hysterectomy    . Colonoscopy  05/2011    [facility]: multiple hyperplastic sessile polyps, scattered diverticulosis. next TCS 05/2016 ([facility])    Prior to Admission medications   Medication Sig Start Date End Date Taking? Authorizing Provider  dicyclomine (BENTYL) 10 MG capsule Take 1 capsule (10 mg total) by mouth 4 (four) times daily as needed. 30 minutes before meals and at bedtime as needed for spasms/diarrhea 07/26/13   Mahala Menghini, PA-C  omeprazole (PRILOSEC) 40 MG capsule Take 1 capsule (40 mg total) by mouth 2 (two) times daily before a meal. 07/20/13 07/20/14  Mahala Menghini, PA-C  phentermine  (ADIPEX-P) 37.5 MG tablet Take 1 tablet (37.5 mg total) by mouth daily before breakfast. 03/03/13 07/03/13  Fayrene Helper, MD  promethazine (PHENERGAN) 25 MG tablet Take 1 tablet (25 mg total) by mouth every 6 (six) hours as needed for nausea or vomiting. 07/01/13   Fayrene Helper, MD  sucralfate (CARAFATE) 1 GM/10ML suspension Take 10 mLs (1 g total) by mouth 4 (four) times daily. 07/01/13 07/01/14  Fayrene Helper, MD  temazepam (RESTORIL) 30 MG capsule Take 1 capsule (30 mg total) by mouth at bedtime as needed for sleep. 07/01/13 01/29/14  Fayrene Helper, MD  topiramate (TOPAMAX) 100 MG tablet Take 1 tablet (100 mg total) by mouth daily. 07/01/13 12/30/13  Fayrene Helper, MD    Allergies as of 07/28/2013 - Review Complete 07/28/2013  Allergen Reaction Noted  . Codeine Nausea And Vomiting 06/30/2008  . Hydrocodone Nausea And Vomiting 01/12/2010    Family History  Problem Relation Age of Onset  . Colon cancer Mother 28  . Diabetes Mother   . Hypertension Mother   . Stroke Mother   . Heart attack Father   . Diabetes Father   . Hypertension Father   . Colon cancer Sister 104  . Lupus Brother   . Parkinsonism Brother   . Anesthesia problems Neg Hx   . Hypotension Neg  Hx   . Malignant hyperthermia Neg Hx   . Pseudochol deficiency Neg Hx     History   Social History  . Marital Status: Married    Spouse Name: N/A    Number of Children: 3  . Years of Education: N/A   Occupational History  . Dialysis Tech    Social History Main Topics  . Smoking status: Never Smoker   . Smokeless tobacco: Not on file  . Alcohol Use: No  . Drug Use: No  . Sexual Activity: Yes    Partners: Male    Birth Control/ Protection: Surgical   Other Topics Concern  . Not on file   Social History Narrative  . No narrative on file    Review of Systems: See HPI, otherwise negative ROS   Physical Exam: There were no vitals taken for this visit. General:   Alert,  pleasant and  cooperative in NAD Head:  Normocephalic and atraumatic. Neck:  Supple; Lungs:  Clear throughout to auscultation.    Heart:  Regular rate and rhythm. Abdomen:  Soft, nontender and nondistended. Normal bowel sounds, without guarding, and without rebound.   Neurologic:  Alert and  oriented x4;  grossly normal neurologically.  Impression/Plan:     ABDOMINAL PAIN/DYSPEPSIA.  PLAN: 1. EGD TODAY

## 2013-07-28 NOTE — Telephone Encounter (Signed)
Order is in for an EGD w/[facility]F today at 1:00

## 2013-07-28 NOTE — Discharge Instructions (Signed)
YOUR UPPER ABDOMINAL PAIN IS MOST LIKELY DUE TO gastritis DUE TO IBUPROFEN. I biopsied your stomach & SMALL BOWEL.   USE TWO TSP EVERY 4 HOURS AS NEEDED TO RELIEVE UPPER ABDOMINAL PAIN. IT WORKS BEST IF USED 15 MINS PRIOR TO MEALS.  DRINK WATER TO KEEP YOUR URINE LIGHT YELLOW.  FOLLOW A LOW FAT DIET. SEE INFO BELOW.  DO NOT USE IBUPROFEN OR ALEVE. CONTINUE TO HOLD CARAFATE AND PHENTERMINE.  CONTINUE OMEPRAZOLE.  TAKE 30 MINUTES PRIOR TO YOUR MEALS TWICE DAILY.  USE BENTYL AS NEEDED FOR DIARRHEA. IT CAN CAUSE CONSTIPATION.  CONTINUE YOUR WEIGHT LOSS EFFORTS.   YOUR BIOPSY WILL BE BACK IN 7 DAYS.  FOLLOW UP IN ONE MONTH.    UPPER ENDOSCOPY AFTER CARE Read the instructions outlined below and refer to this sheet in the next week. These discharge instructions provide you with general information on caring for yourself after you leave the hospital. While your treatment has been planned according to the most current medical practices available, unavoidable complications occasionally occur. If you have any problems or questions after discharge, call DR. Tierria Watson, [phone].  ACTIVITY  You may resume your regular activity, but move at a slower pace for the next 24 hours.   Take frequent rest periods for the next 24 hours.   Walking will help get rid of the air and reduce the bloated feeling in your belly (abdomen).   No driving for 24 hours (because of the medicine (anesthesia) used during the test).   You may shower.   Do not sign any important legal documents or operate any machinery for 24 hours (because of the anesthesia used during the test).    NUTRITION  Drink plenty of fluids.   You may resume your normal diet as instructed by your doctor.   Begin with a light meal and progress to your normal diet. Heavy or fried foods are harder to digest and may make you feel sick to your stomach (nauseated).   Avoid alcoholic beverages for 24 hours or as instructed.     MEDICATIONS  You may resume your normal medications.   WHAT YOU CAN EXPECT TODAY  Some feelings of bloating in the abdomen.   Passage of more gas than usual.    IF YOU HAD A BIOPSY TAKEN DURING THE UPPER ENDOSCOPY:  Eat a soft diet IF YOU HAVE NAUSEA, BLOATING, ABDOMINAL PAIN, OR VOMITING.    FINDING OUT THE RESULTS OF YOUR TEST Not all test results are available during your visit. DR. Oneida Alar WILL CALL YOU WITHIN 7 DAYS OF YOUR PROCEDUE WITH YOUR RESULTS. Do not assume everything is normal if you have not heard from DR. Dealva Lafoy IN ONE WEEK, CALL HER OFFICE AT [phone].  SEEK IMMEDIATE MEDICAL ATTENTION AND CALL THE OFFICE: [phone] IF:  You have more than a spotting of blood in your stool.   Your belly is swollen (abdominal distention).   You are nauseated or vomiting.   You have a temperature over 101F.   You have abdominal pain or discomfort that is severe or gets worse throughout the day.   Gastritis  Gastritis is an inflammation (the body's way of reacting to injury and/or infection) of the stomach. It is often caused by bacterial (germ) infections. It can also be caused BY ASPIRIN, BC/GOODY POWDER'S, (IBUPROFEN) MOTRIN, OR ALEVE (NAPROXEN), chemicals (including alcohol), SPICY FOODS, and medications. This illness may be associated with generalized malaise (feeling tired, not well), UPPER ABDOMINAL STOMACH cramps, and fever. One common bacterial  cause of gastritis is an organism known as H. Pylori. This can be treated with antibiotics.    HOME CARE INSTRUCTIONS  Try to achieve and maintain an ideal body weight.   Avoid drinking alcoholic beverages.   DO NOT smokE.   Do not wear tight clothing around your chest or stomach.   Eat smaller meals and eat more frequently. This keeps your stomach from getting too full. Eat slowly.   Do not lie down for 2 or 3 hours after eating. Do not eat or drink anything 1 to 2 hours before going to bed.   Avoid  caffeine beverages (colas, coffee, cocoa, tea), fatty foods, citrus fruits and all other foods and drinks that contain acid and that seem to increase the problems.     Low-Fat Diet BREADS, CEREALS, PASTA, RICE, DRIED PEAS, AND BEANS These products are high in carbohydrates and most are low in fat. Therefore, they can be increased in the diet as substitutes for fatty foods. They too, however, contain calories and should not be eaten in excess. Cereals can be eaten for snacks as well as for breakfast.  Include foods that contain fiber (fruits, vegetables, whole grains, and legumes). Research shows that fiber may lower blood cholesterol levels, especially the water-soluble fiber found in fruits, vegetables, oat products, and legumes. FRUITS AND VEGETABLES It is good to eat fruits and vegetables. Besides being sources of fiber, both are rich in vitamins and some minerals. They help you get the daily allowances of these nutrients. Fruits and vegetables can be used for snacks and desserts. MEATS Limit lean meat, chicken, [place], and fish to no more than 6 ounces per day. Beef, Pork, and Lamb Use lean cuts of beef, pork, and lamb. Lean cuts include:  Extra-lean ground beef.  Arm roast.  Sirloin tip.  Center-cut ham.  Round steak.  Loin chops.  Rump roast.  Tenderloin.  Trim all fat off the outside of meats before cooking. It is not necessary to severely decrease the intake of red meat, but lean choices should be made. Lean meat is rich in protein and contains a highly absorbable form of iron. Premenopausal women, in particular, should avoid reducing lean red meat because this could increase the risk for low red blood cells (iron-deficiency anemia).  Chicken and [place] These are good sources of protein. The fat of poultry can be reduced by removing the skin and underlying fat layers before cooking. Chicken and [place] can be substituted for lean red meat in the diet. Poultry should not be fried or  covered with high-fat sauces. Fish and Shellfish Fish is a good source of protein. Shellfish contain cholesterol, but they usually are low in saturated fatty acids. The preparation of fish is important. Like chicken and [place], they should not be fried or covered with high-fat sauces. EGGS Egg whites contain no fat or cholesterol. They can be eaten often. Try 1 to 2 egg whites instead of whole eggs in recipes or use egg substitutes that do not contain yolk.  MILK AND DAIRY PRODUCTS Use skim or 1% milk instead of 2% or whole milk. Decrease whole milk, natural, and processed cheeses. Use nonfat or low-fat (2%) cottage cheese or low-fat cheeses made from vegetable oils. Choose nonfat or low-fat (1 to 2%) yogurt. Experiment with evaporated skim milk in recipes that call for heavy cream. Substitute low-fat yogurt or low-fat cottage cheese for sour cream in dips and salad dressings. Have at least 2 servings of low-fat dairy products, such  as 2 glasses of skim (or 1%) milk each day to help get your daily calcium intake.  FATS AND OILS Butterfat, lard, and beef fats are high in saturated fat and cholesterol. These should be avoided.Vegetable fats do not contain cholesterol. AVOID coconut oil, palm oil, and palm kernel oil, WHICH are very high in saturated fats. These should be limited. These fats are often used in bakery goods, processed foods, popcorn, oils, and nondairy creamers. Vegetable shortenings and some peanut butters contain hydrogenated oils, which are also saturated fats. Read the labels on these foods and check for saturated vegetable oils.  Desirable liquid vegetable oils are corn oil, cottonseed oil, olive oil, canola oil, safflower oil, soybean oil, and sunflower oil. Peanut oil is not as good, but small amounts are acceptable. Buy a heart-healthy tub margarine that has no partially hydrogenated oils in the ingredients. AVOID Mayonnaise and salad dressings often are made from unsaturated  fats.  OTHER EATING TIPS Snacks  Most sweets should be limited as snacks. They tend to be rich in calories and fats, and their caloric content outweighs their nutritional value. Some good choices in snacks are graham crackers, melba toast, soda crackers, bagels (no egg), English muffins, fruits, and vegetables. These snacks are preferable to snack crackers, [place] fries, and chips. Popcorn should be air-popped or cooked in small amounts of liquid vegetable oil.  Desserts Eat fruit, low-fat yogurt, and fruit ices instead of pastries, cake, and cookies. Sherbet, angel food cake, gelatin dessert, frozen low-fat yogurt, or other frozen products that do not contain saturated fat (pure fruit juice bars, frozen ice pops) are also acceptable.   COOKING METHODS Choose those methods that use little or no fat. They include: Poaching.  Braising.  Steaming.  Grilling.  Baking.  Stir-frying.  Broiling.  Microwaving.  Foods can be cooked in a nonstick pan without added fat, or use a nonfat cooking spray in regular cookware. Limit fried foods and avoid frying in saturated fat. Add moisture to lean meats by using water, broth, cooking wines, and other nonfat or low-fat sauces along with the cooking methods mentioned above. Soups and stews should be chilled after cooking. The fat that forms on top after a few hours in the refrigerator should be skimmed off. When preparing meals, avoid using excess salt. Salt can contribute to raising blood pressure in some people.  EATING AWAY FROM HOME Order entres, potatoes, and vegetables without sauces or butter. When meat exceeds the size of a deck of cards (3 to 4 ounces), the rest can be taken home for another meal. Choose vegetable or fruit salads and ask for low-calorie salad dressings to be served on the side. Use dressings sparingly. Limit high-fat toppings, such as bacon, crumbled eggs, cheese, sunflower seeds, and olives. Ask for heart-healthy tub margarine  instead of butter.

## 2013-07-28 NOTE — Telephone Encounter (Signed)
I spoke directly with pt earlier this morning, still no appt with GI in [place]. Scheduler called the office , and patient's record was "being reviewed" by several of the Docs. I called Latoya Decker and explained still no appt has been confirmed. I also made her aware that Dr Oneida Alar had offered yesterday , to do an upper endo on her today , if she wanted this. She stated she is willing to see Dr Oneida Alar, unfortunastely , I had not called her yesterday in this regard so I am unsure of her NPO status. I then contacted Dr Oneida Alar to update her on current situation  And to thanks her also for the ease of  Communication and gher care. She will contact pt directly to make further arrangements re her GI complaints

## 2013-08-02 ENCOUNTER — Encounter (HOSPITAL_COMMUNITY): Payer: Self-pay | Admitting: Gastroenterology

## 2013-08-04 ENCOUNTER — Ambulatory Visit: Payer: BC Managed Care – PPO | Admitting: Gastroenterology

## 2013-08-11 ENCOUNTER — Encounter: Payer: Self-pay | Admitting: Gastroenterology

## 2013-08-13 ENCOUNTER — Telehealth: Payer: Self-pay | Admitting: Gastroenterology

## 2013-08-13 ENCOUNTER — Encounter: Payer: Self-pay | Admitting: Gastroenterology

## 2013-08-13 NOTE — Telephone Encounter (Signed)
LMOM to call.

## 2013-08-13 NOTE — Telephone Encounter (Addendum)
PLEASE CALL PT. SHE HAS EOSINOPHILIC GASTROENTERITIS WHICH MAY CAUSE ABD PAIN, NAUSEA, DIARRHEA, AND VOMITING. IT CAN BE RELATED TO FOOD ALLERGIES. She needs a CBC/DIFF. SHE NEEDS TO SEE NUTRITION TO RECEIVE INSTRUCTION ON A SIX FOOD ELIMINATION DIET. CALL [facility] WITH AN UPDATE  SX 2 WEEKS AFTER SHE STARTS THE ELIMINATION DIET. OPV IN 8 WEEKS.

## 2013-08-13 NOTE — Telephone Encounter (Signed)
Forwarding to [employer] at [facility] for Nurtrition management

## 2013-08-16 ENCOUNTER — Telehealth (HOSPITAL_COMMUNITY): Payer: Self-pay | Admitting: Dietician

## 2013-08-16 ENCOUNTER — Other Ambulatory Visit: Payer: Self-pay

## 2013-08-16 DIAGNOSIS — R1013 Epigastric pain: Secondary | ICD-10-CM

## 2013-08-16 NOTE — Telephone Encounter (Signed)
Pt returned call. She is aware of her results. She will go to lab to have CBC/Diff. She is aware that nutritionist will be calling her she should call us 2 weeks after she has talked with nutritionist with an update. ( Lab order faxed to [facility]). Pt is also aware of her OV appt.

## 2013-08-16 NOTE — Telephone Encounter (Signed)
Message copied by Domenick Bookbinder on Mon Aug 16, 2013  4:26 PM ------      Message from: Saintclair Halsted A      Created: Fri Aug 13, 2013 11:39 AM        SHE NEEDS TO SEE NUTRITION TO RECEIVE INSTRUCTION ON A SIX FOOD ELIMINATION DIET. CALL [facility] WITH AN UPDATE  SX 2 WEEKS AFTER SHE STARTS THE ELIMINATION DIET. OPV IN 8 WEEKS.      ------

## 2013-08-16 NOTE — Telephone Encounter (Signed)
Received referral via staff message.

## 2013-08-16 NOTE — Telephone Encounter (Signed)
Pt has OV on 2/11 at 1030 with SF

## 2013-08-17 NOTE — Telephone Encounter (Signed)
Called at 1310. Scheduled for 08/20/13 at 0900.

## 2013-08-20 ENCOUNTER — Encounter (HOSPITAL_COMMUNITY): Payer: Self-pay | Admitting: Dietician

## 2013-08-20 NOTE — Progress Notes (Signed)
Outpatient Initial Nutrition Assessment  Date:08/20/2013   Appt Start Time: 0908  Referring Physician: Dr. Oneida Alar Reason for Visit:  6 food elimation diet education  Nutrition Assessment:  Height: 5\' 7"  (170.2 cm)   Weight: 174 lb (78.926 kg)   IBW: 135#  %IBW: 129% UBW: 185#  %UBW: 106% Body mass index is 27.25 kg/(m^2). Meets criteria for overweight. Goal Weight: Weight maintenance Weight hx: Wt Readings from Last 10 Encounters:  08/20/13 174 lb (78.926 kg)  07/28/13 174 lb (78.926 kg)  07/28/13 174 lb (78.926 kg)  07/20/13 183 lb (83.008 kg)  07/01/13 186 lb 6.4 oz (84.55 kg)  05/19/13 180 lb (81.647 kg)  03/03/13 192 lb (87.091 kg)  01/13/13 192 lb 0.6 oz (87.109 kg)  11/16/12 199 lb 12.8 oz (90.629 kg)  08/31/12 199 lb 6.4 oz (90.447 kg)   Noted decline in weight over the past year.   Estimated nutritional needs:  Kcals/ day: 1700-1900 Protein (grams)/day: 63-79  Fluid (L)/ day: 1.7-1.9  PMH:  Past Medical History  Diagnosis Date  . Anxiety   . Depression   . Headache(784.0)     migraines  . Duodenal ulcer 01/05/2010    EGD Dr Dierdre Searles, h pylori gastritis, completed treatment  . Duodenitis   . Gastritis     hospitalized 6/23-6/25 2011   . PUD (peptic ulcer disease)     Dr Tamala Julian  . S/P colonoscopy 12/27/2003    Dr Voncille Lo  . Family hx of colon cancer     Mother & sister  . Helicobacter pylori gastritis   . S/P endoscopy 12/27/2003    Dr [facility] Service superficial ulcers GEJ, duodenitis,gastritis  . Hypertension   . Eosinophilic gastroenteritis JAN 2015    [place] 2014: NL CMP/GIARDIA Ag    Medications:  Current Outpatient Rx  Name  Route  Sig  Dispense  Refill  . dicyclomine (BENTYL) 10 MG capsule   Oral   Take 1 capsule (10 mg total) by mouth 4 (four) times daily as needed. 30 minutes before meals and at bedtime as needed for spasms/diarrhea   120 capsule   1   . lidocaine (XYLOCAINE) 2 % solution      2 TSP  PO Q4H PRN FOR ABDOMINAL PAIN. IT WORKS  BEST IF USED 15 MINS PRIOR TO MEALS.   200 mL   3     NO MORE THAN 8 DOSES A DAY   . omeprazole (PRILOSEC) 40 MG capsule   Oral   Take 1 capsule (40 mg total) by mouth 2 (two) times daily before a meal.   60 capsule   1   . promethazine (PHENERGAN) 25 MG tablet   Oral   Take 1 tablet (25 mg total) by mouth every 6 (six) hours as needed for nausea or vomiting.   20 tablet   0   . temazepam (RESTORIL) 30 MG capsule   Oral   Take 1 capsule (30 mg total) by mouth at bedtime as needed for sleep.   30 capsule   4   . topiramate (TOPAMAX) 100 MG tablet   Oral   Take 1 tablet (100 mg total) by mouth daily.   30 tablet   5     Dose increase effective 07/01/2013     Labs: CMP     Component Value Date/Time   NA 142 07/01/2013 0907   K 4.0 07/01/2013 0907   CL 109 07/01/2013 0907   CO2 24 07/01/2013 0907   GLUCOSE 88 07/01/2013  0907   BUN 10 07/01/2013 0907   CREATININE 0.92 07/01/2013 0907   CREATININE 0.94 12/10/2011 0903   CALCIUM 8.9 07/01/2013 0907   PROT 6.4 07/01/2013 0907   ALBUMIN 4.3 07/01/2013 0907   AST 12 07/01/2013 0907   ALT 8 07/01/2013 0907   ALKPHOS 130* 07/01/2013 0907   BILITOT 0.4 07/01/2013 0907   GFRNONAA 70* 12/10/2011 0903   GFRAA 81* 12/10/2011 0903    Lipid Panel     Component Value Date/Time   CHOL 132 09/08/2012 1330   TRIG 100 09/08/2012 1330   HDL 43 09/08/2012 1330   CHOLHDL 3.1 09/08/2012 1330   VLDL 20 09/08/2012 1330   LDLCALC 69 09/08/2012 1330     No results found for this basename: HGBA1C   Lab Results  Component Value Date   LDLCALC 69 09/08/2012   CREATININE 0.92 07/01/2013     Lifestyle/ social habits: Ms. Hollinger resides in [place] with her husband. 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   Nutrition hx/habits: Ms. Munsch reports decreased appetite for several weeks. She reports due to severe abdominal pain, she has been eating a diet limited to baked proteins and creamed potatoes. She reports that over  the past weeks she tried to eat a fish sandwich and pizza and she "felt like I was gonna die".  She has never seen an allergist. She reports her grown son has a severe allergy to fish; the allergy is so severe that he will have reactions being in the same area as fish. No other family members have allergies to her knowledge.  She reports she does not know what to eat and would like to improve her quality of life. Noted progressive weight loss. She reports she was initially trying to lose weight, however, weight has been declining recently due to poor oral intake.   Diet recall: Pt has been following a bland diet. Diet consists mostly of baked foods and creamed potatoes.   Nutrition Diagnosis: Nutrition-related knowledge deficit r/t no prior diet education AEB pt with multiple diet related questions.   Nutrition Intervention: Nutrition rx: 6 food elimination diet  Education/Counseling Provided: Provided education on elimination diet. Discussed how eosinophilic gastroenteritis may be related to food allergies and elimination diet will assist in ruling out if symptoms are allergy related. Provided education on the 6 major food allergens (wheat, dairy, fish/shellfish, soy, eggs, and nuts).  Discussed common foods containing each allergens and strategies to avoid allergens in foods. Discussed alternative foods for each allergen. Provided ideas for meals, snacks, and recipes. Encouraged diet consisting of lean meats, fruits, and vegetables for meal planning and to help with compliance. Provided "Elimiation Diet" handout. Teachback method used.  Understanding, Motivation, Ability to Follow Recommendations: Expect fair to good compliance.   Monitoring and Evaluation: Goals: 1) Weight maintenance; 2) Diet compliance  Recommendations: 1) Encourage diet of fruits, vegetables, and meats; 2) Try to prepare food at home as often as possible; 3) Follow-up per Dr. Nona Dell instructions  F/U: PRN. Pt has RD  contact information.   Rori Goar A. Jimmye Norman, RD, LDN 08/20/2013  Appt EndTime:

## 2013-08-25 ENCOUNTER — Encounter (INDEPENDENT_AMBULATORY_CARE_PROVIDER_SITE_OTHER): Payer: Self-pay

## 2013-08-25 ENCOUNTER — Ambulatory Visit (INDEPENDENT_AMBULATORY_CARE_PROVIDER_SITE_OTHER): Payer: BC Managed Care – PPO | Admitting: Gastroenterology

## 2013-08-25 ENCOUNTER — Encounter: Payer: Self-pay | Admitting: Gastroenterology

## 2013-08-25 VITALS — BP 128/80 | HR 71 | Temp 97.0°F | Ht 67.0 in | Wt 173.8 lb

## 2013-08-25 DIAGNOSIS — K5281 Eosinophilic gastritis or gastroenteritis: Secondary | ICD-10-CM

## 2013-08-25 MED ORDER — PREDNISONE 10 MG PO TABS: ORAL_TABLET | ORAL | Status: DC

## 2013-08-25 NOTE — Progress Notes (Signed)
Subjective:    Patient ID: Latoya Decker, female    DOB: May 01, 1961, 53 y.o.   MRN: [id number] Latoya Nakayama, MD  HPI Stomach better but sx not resolved. Still having to watch what she eats very closely. Dietician at work is helping. CRIED WHEN SHE HAD TO RESTRICT HER DIET. LOVES FISH AND SEAFOOD BUT IT MAKES HER STOMACH HURT. SON IS ALLERGIC TO FISH. WENT TO PGs AND ATE CHICKEN FRIED AND ABD HURT IMMEDIATELY. BACK AND FORTH TO BR ALL DAY AND NIGHT: NO NAUSEA. JUST DIARRHEA AND ABD PAIN. NO VOMITING. ABD PAIN ONLY WHEN SHE EATS SOMETHING SHE'S NOT SUPPOSE TO. DIET WORKS BUT HARD TO FOLLOW. BMS; DAILY(WATERY/LOOSE/RARELY SOLID). PT DENIES FEVER, CHILLS, BRBPR, nausea, vomiting, melena, constipation,  problems swallowing, problems with sedation, OR heartburn or indigestion.  Past Medical History  Diagnosis Date  . Anxiety   . Depression   . Headache(784.0)     migraines  . Duodenal ulcer 01/05/2010    EGD Dr Dierdre Searles, h pylori gastritis, completed treatment  . Duodenitis   . Gastritis     hospitalized 6/23-6/25 2011   . PUD (peptic ulcer disease)     Dr Tamala Julian  . S/P colonoscopy 12/27/2003    Dr Voncille Lo  . Family hx of colon cancer     Mother & sister  . Helicobacter pylori gastritis   . S/P endoscopy 12/27/2003    Dr [facility] Service superficial ulcers GEJ, duodenitis,gastritis  . Hypertension   . Eosinophilic gastroenteritis JAN 2015    [place] 2014: NL CMP/GIARDIA Ag    Past Surgical History  Procedure Laterality Date  . Bilateral tubal ligation  1989  . Partial hysterectomy  2007    1 ovary removed  . Cholecystectomy  1997    aph-Smith  . Tubal ligation    . Polypectomy  06/10/2011    Procedure: POLYPECTOMY;  Surgeon: Dorothyann Peng, MD;  Location: [facility];  Service: Endoscopy;;  Cecal polypectomy; Sigmoid colon polypectomies  . Esophageal biopsy  06/10/2011    SLF: mild gastritis, bx benign, sb bx benign   . Abdominal hysterectomy    . Colonoscopy  05/2011    SLF:  multiple hyperplastic sessile polyps, scattered diverticulosis. next TCS 05/2016 ([facility])  . Esophagogastroduodenoscopy N/A 2/44/0102    EOS INOPHILIC GASTROENTERITIS    Allergies  Allergen Reactions  . Codeine Nausea And Vomiting  . Hydrocodone Nausea And Vomiting  . Lidocaine Viscous     Couldn't think straight    Current Outpatient Prescriptions  Medication Sig Dispense Refill  . amLODipine (NORVASC) 2.5 MG tablet Take 2.5 mg by mouth daily.    . temazepam (RESTORIL) 30 MG capsule Take 1 capsule (30 mg total) by mouth at bedtime as needed for sleep.    Marland Kitchen topiramate (TOPAMAX) 100 MG tablet Take 1 tablet (100 mg total) by mouth daily.    .      . omeprazole (PRILOSEC) 40 MG capsule Take 1 capsule (40 mg total) by mouth 2 (two) times daily before a meal.    . promethazine (PHENERGAN) 25 MG tablet Take 1 tablet (25 mg total) by mouth every 6 (six) hours as needed for nausea or vomiting.        Review of Systems     Objective:   Physical Exam  Vitals reviewed. Constitutional: She is oriented to person, place, and time. She appears well-nourished. No distress.  HENT:  Head: Normocephalic and atraumatic.  Mouth/Throat: Oropharynx is clear and moist. No oropharyngeal exudate.  Eyes: Pupils are equal, round, and reactive to light. No scleral icterus.  Neck: Normal range of motion. Neck supple.  Cardiovascular: Normal rate, regular rhythm and normal heart sounds.   Pulmonary/Chest: Effort normal and breath sounds normal. No respiratory distress.  Abdominal: Soft. Bowel sounds are normal. She exhibits no distension. There is tenderness. There is no rebound and no guarding.  MILD TTP x4  Musculoskeletal: She exhibits no edema.  Lymphadenopathy:    She has no cervical adenopathy.  Neurological: She is alert and oriented to person, place, and time.  NO FOCAL DEFICITS   Psychiatric: She has a normal mood and affect.          Assessment & Plan:

## 2013-08-25 NOTE — Patient Instructions (Addendum)
YOUR DIAGNOSIS IS EOSINOPHILIC GASTROENTERITIS.    COMPLETE STEROID TAPER OVER THE NEXT 4 WEEKS. TAKE STEROIDS WITH FOOD OR MILK.  USE OMEPRAZOLE DAILY WHILE ON STEROIDS AND THEN YOU CAN USE AS NEEDED FOR HEARTBURN OR UPPER ABDOMINAL PAIN.  CONTINUE SIX FOOD ELIMINATION DIET.  FOLLOW UP IN 6 WEEKS. CALL WITH QUESTIONS OR CONCERNS.

## 2013-08-25 NOTE — Assessment & Plan Note (Signed)
SX IMPROVED BUT NOT RESOLVED.PT STILL HAS INTERMITTENT DIARRHEA AND ABD PAIN.   DISCUSSED THE NATURE OF EOSINOPHILIC GE. PREDNISONE TAPER SIX FOOD ELIMINATION DIET USE OMP DAILY OPV IN 6 WEEKS

## 2013-10-06 ENCOUNTER — Encounter: Payer: Self-pay | Admitting: Gastroenterology

## 2013-10-06 ENCOUNTER — Encounter (INDEPENDENT_AMBULATORY_CARE_PROVIDER_SITE_OTHER): Payer: Self-pay

## 2013-10-06 ENCOUNTER — Ambulatory Visit (INDEPENDENT_AMBULATORY_CARE_PROVIDER_SITE_OTHER): Payer: BC Managed Care – PPO | Admitting: Gastroenterology

## 2013-10-06 VITALS — BP 121/72 | HR 76 | Temp 98.3°F | Ht 67.0 in | Wt 179.8 lb

## 2013-10-06 DIAGNOSIS — K5281 Eosinophilic gastritis or gastroenteritis: Secondary | ICD-10-CM

## 2013-10-06 NOTE — Progress Notes (Signed)
Subjective:    Patient ID: Latoya Decker, female    DOB: [birth year], 53 y.o.   MRN: [id number] Latoya Nakayama, MD  HPI TOOK ALL MEDS. FEELS BETTER. WHEN CAME OFF MEDS HAD HOT FLASHES FOR 3 WEEKS. HAS TRIED TO REINTRODUCE FISH(FLOUNDER/SHRIMP) AND DAIRY BUT IT MAKES HER STOMACH HURT. BMs: DAILY. PT DENIES FEVER, CHILLS, BRBPR, nausea, vomiting, melena, diarrhea, constipation, abd pain, problems swallowing, problems with sedation, heartburn or indigestion.  Past Medical History  Diagnosis Date  . Anxiety   . Depression   . Headache(784.0)     migraines  . Duodenal ulcer 01/05/2010    EGD Dr Dierdre Searles, h pylori gastritis, completed treatment  . Duodenitis   . Gastritis     hospitalized 6/23-6/25 2011   . PUD (peptic ulcer disease)     Dr Tamala Julian  . S/P colonoscopy 12/27/2003    Dr Voncille Lo  . Family hx of colon cancer     Mother & sister  . Helicobacter pylori gastritis   . S/P endoscopy 12/27/2003    Dr [facility] Service superficial ulcers GEJ, duodenitis,gastritis  . Hypertension   . Eosinophilic gastroenteritis JAN 2015    [place] 2014: NL CMP/GIARDIA Ag    Past Surgical History  Procedure Laterality Date  . Bilateral tubal ligation  1989  . Partial hysterectomy  2007    1 ovary removed  . Cholecystectomy  1997    aph-Smith  . Tubal ligation    . Polypectomy  06/10/2011    Procedure: POLYPECTOMY;  Surgeon: Dorothyann Peng, MD;  Location: [facility];  Service: Endoscopy;;  Cecal polypectomy; Sigmoid colon polypectomies  . Esophageal biopsy  06/10/2011    SLF: mild gastritis, bx benign, sb bx benign   . Abdominal hysterectomy    . Colonoscopy  05/2011    SLF: multiple hyperplastic sessile polyps, scattered diverticulosis. next TCS 05/2016 ([facility])  . Esophagogastroduodenoscopy N/A 5/36/6440    EOS INOPHILIC GASTROENTERITIS   Allergies  Allergen Reactions  . Codeine Nausea And Vomiting  . Hydrocodone Nausea And Vomiting  . Lidocaine Viscous     Couldn't think straight     Current Outpatient Prescriptions  Medication Sig Dispense Refill  . amLODipine (NORVASC) 2.5 MG tablet Take 2.5 mg by mouth daily.      Marland Kitchen dicyclomine (BENTYL) 10 MG capsule Take 1 capsule (10 mg total) by mouth 4 (four) times daily as needed. 30 minutes before meals and at bedtime as needed for spasms/diarrhea NOT USING   . omeprazole (PRILOSEC) 40 MG capsule Take 1 capsule (40 mg total) by mouth 2 (two) times daily before a meal.    . temazepam (RESTORIL) 30 MG capsule Take 1 capsule (30 mg total) by mouth at bedtime as needed for sleep.    Marland Kitchen topiramate (TOPAMAX) 100 MG tablet Take 1 tablet (100 mg total) by mouth daily.    .      .           Review of Systems     Objective:   Physical Exam  Vitals reviewed. Constitutional: She is oriented to person, place, and time. She appears well-nourished. No distress.  HENT:  Head: Normocephalic and atraumatic.  Mouth/Throat: Oropharynx is clear and moist. No oropharyngeal exudate.  Eyes: Pupils are equal, round, and reactive to light. No scleral icterus.  Neck: Normal range of motion. Neck supple.  Cardiovascular: Normal rate, regular rhythm and normal heart sounds.   Pulmonary/Chest: Effort normal and breath sounds normal. No respiratory distress.  Abdominal:  Soft. Bowel sounds are normal. She exhibits no distension. There is no tenderness.  Lymphadenopathy:    She has no cervical adenopathy.  Neurological: She is alert and oriented to person, place, and time.  NO FOCAL DEFICITS   Psychiatric: She has a normal mood and affect.          Assessment & Plan:

## 2013-10-06 NOTE — Patient Instructions (Signed)
YOU MAY TRY TO RE-INTRODUCE FOODS. AVOID INDEFINITELY IF THEY CAUSE ABDOMINAL PAIN, NAUSEA, OR VOMITING.  PLEASE CALL WITH QUESTIONS OR CONCERNS.  FOLLOW UP IN 6 MOS.

## 2013-10-06 NOTE — Assessment & Plan Note (Signed)
Sx RESOLVED.  MAY TRY TO RE-INTRODUCE FOODS. AVOID INDEFINITELY IF THEY CAUSE ABDOMINAL PAIN, NAUSEA, OR VOMITING. CALL WITH QUESTIONS OR CONCERNS. FOLLOW UP IN 6 MOS.

## 2013-10-06 NOTE — Progress Notes (Signed)
cc'd to pcp 

## 2013-10-07 NOTE — Progress Notes (Signed)
Reminder in epic °

## 2013-10-19 ENCOUNTER — Ambulatory Visit: Payer: BC Managed Care – PPO | Admitting: Family Medicine

## 2013-11-24 ENCOUNTER — Ambulatory Visit (INDEPENDENT_AMBULATORY_CARE_PROVIDER_SITE_OTHER): Payer: BC Managed Care – PPO | Admitting: Family Medicine

## 2013-11-24 ENCOUNTER — Encounter (INDEPENDENT_AMBULATORY_CARE_PROVIDER_SITE_OTHER): Payer: Self-pay

## 2013-11-24 ENCOUNTER — Encounter: Payer: Self-pay | Admitting: Family Medicine

## 2013-11-24 VITALS — BP 110/80 | HR 86 | Resp 18 | Ht 65.5 in | Wt 183.0 lb

## 2013-11-24 DIAGNOSIS — Z139 Encounter for screening, unspecified: Secondary | ICD-10-CM

## 2013-11-24 DIAGNOSIS — I10 Essential (primary) hypertension: Secondary | ICD-10-CM

## 2013-11-24 DIAGNOSIS — G4700 Insomnia, unspecified: Secondary | ICD-10-CM

## 2013-11-24 DIAGNOSIS — Z13220 Encounter for screening for lipoid disorders: Secondary | ICD-10-CM

## 2013-11-24 DIAGNOSIS — R7301 Impaired fasting glucose: Secondary | ICD-10-CM

## 2013-11-24 DIAGNOSIS — E663 Overweight: Secondary | ICD-10-CM

## 2013-11-24 DIAGNOSIS — K5281 Eosinophilic gastritis or gastroenteritis: Secondary | ICD-10-CM

## 2013-11-24 DIAGNOSIS — G43909 Migraine, unspecified, not intractable, without status migrainosus: Secondary | ICD-10-CM

## 2013-11-24 DIAGNOSIS — J309 Allergic rhinitis, unspecified: Secondary | ICD-10-CM

## 2013-11-24 MED ORDER — BUTALBITAL-APAP-CAFFEINE 50-325-40 MG PO TABS: ORAL_TABLET | ORAL | Status: DC

## 2013-11-24 MED ORDER — OLOPATADINE HCL 0.1 % OP SOLN: 1.0000 [drp] | Freq: Two times a day (BID) | OPHTHALMIC | Status: DC

## 2013-11-24 MED ORDER — LORATADINE 10 MG PO TBDP: 10.0000 mg | ORAL_TABLET | Freq: Every day | ORAL | Status: DC

## 2013-11-24 NOTE — Patient Instructions (Signed)
F/u in 5 month with rectal, call if you need me before  cBC , fasting lipid, chem 7 , hBA1C , tSH and Vit D in the am, we will call with results  Medication (2) is sent for allergies, get pharmacist to show you generic brands of either claritin or zyrtec, also for headache  Please sched mammogram  Blood pressure is excellent

## 2013-11-28 NOTE — Assessment & Plan Note (Signed)
Sleep hygiene reviewed and written information offered also. Prescription sent for  medication needed. Controlled, no change in medication  

## 2013-11-28 NOTE — Assessment & Plan Note (Addendum)
Unchnaged. Patient re-educated about  the importance of commitment to a  minimum of 150 minutes of exercise per week. The importance of healthy food choices with portion control discussed. Encouraged to start a food diary, count calories and to consider  joining a support group. Sample diet sheets offered. Goals set by the patient for the next several months.    

## 2013-11-28 NOTE — Progress Notes (Signed)
   Subjective:    Patient ID: Latoya Decker, female    DOB: [birth year], 53 y.o.   MRN: [id number]  HPI  The PT is here for follow up and re-evaluation of chronic medical conditions, medication management and review of any available recent lab and radiology data.  Preventive health is updated, specifically  Cancer screening and Immunization.   Questions or concerns regarding consultations or procedures which the PT has had in the interim are  Addressed.Happy that cause of her disabling abdominal pain was discovered and states dong much better The PT denies any adverse reactions to current medications since the last visit.  There are no new concerns.  Increased and uncontrolled allergy symptoms x 3 weeks, itchy watery eyes, sneezing and nasal drainage, no fever or chills   Review of Systems See HPI Denies recent fever or chills. Denies sinus pressure,  ear pain or sore throat. Denies chest congestion, productive cough or wheezing. Denies chest pains, palpitations and leg swelling Denies abdominal pain, nausea, vomiting,diarrhea or constipation.   Denies dysuria, frequency, hesitancy or incontinence. Denies joint pain, swelling and limitation in mobility. Denies uncontrolled  headaches, seizures, numbness, or tingling. Denies depression, anxiety or insomnia. Denies skin break down or rash.        Objective:   Physical Exam BP 110/80  Pulse 86  Resp 18  Ht 5' 5.5" (1.664 m)  Wt 183 lb (83.008 kg)  BMI 29.98 kg/m2  SpO2 98% Patient alert and oriented and in no cardiopulmonary distress.  HEENT: No facial asymmetry, EOMI, no sinus tenderness,  oropharynx pink and moist.  Neck supple no adenopathy.Conjuntiva excessively wateryt and red, clear drainage, excessive sneezing  Chest: Clear to auscultation bilaterally.  CVS: S1, S2 no murmurs, no S3.  ABD: Soft non tender.   Ext: No edema  MS: Adequate ROM spine, shoulders, hips and knees.  Skin: Intact, no ulcerations or  rash noted.  Psych: Good eye contact, normal affect. Memory intact not anxious or depressed appearing.  CNS: CN 2-12 intact, power,  normal throughout.        Assessment & Plan:  HTN (hypertension) Controlled, no change in medication DASH diet and commitment to daily physical activity for a minimum of 30 minutes discussed and encouraged, as a part of hypertension management. The importance of attaining a healthy weight is also discussed.   Allergic rhinitis Uncontrolled, pt to commit tpo daily oTC med, and eye drops sent in for symptom relief, she does not use contacts  Eosinophilic gastroenteritis Pt reports symptom relief when she presented in Dec with severe abdominal pain and weight loss. States now she knows to be careful with food choice symptoms are much better  Insomnia Sleep hygiene reviewed and written information offered also. Prescription sent for  medication needed. Controlled, no change in medication   Overweight (BMI 25.0-29.9) Unchnaged Patient re-educated about  the importance of commitment to a  minimum of 150 minutes of exercise per week. The importance of healthy food choices with portion control discussed. Encouraged to start a food diary, count calories and to consider  joining a support group. Sample diet sheets offered. Goals set by the patient for the next several months.     Migraine headache Controlled, no change in medication

## 2013-11-28 NOTE — Assessment & Plan Note (Signed)
Uncontrolled, pt to commit tpo daily oTC med, and eye drops sent in for symptom relief, she does not use contacts

## 2013-11-28 NOTE — Assessment & Plan Note (Signed)
Controlled, no change in medication  

## 2013-11-28 NOTE — Assessment & Plan Note (Signed)
Pt reports symptom relief when she presented in Dec with severe abdominal pain and weight loss. States now she knows to be careful with food choice symptoms are much better

## 2013-11-28 NOTE — Assessment & Plan Note (Signed)
Controlled, no change in medication DASH diet and commitment to daily physical activity for a minimum of 30 minutes discussed and encouraged, as a part of hypertension management. The importance of attaining a healthy weight is also discussed.  

## 2013-11-30 ENCOUNTER — Other Ambulatory Visit: Payer: Self-pay | Admitting: Family Medicine

## 2013-11-30 LAB — CBC WITH DIFFERENTIAL/PLATELET
Basophils Absolute: 0.1 10*3/uL (ref 0.0–0.1)
Basophils Relative: 1 % (ref 0–1)
Eosinophils Absolute: 0.1 10*3/uL (ref 0.0–0.7)
Eosinophils Relative: 2 % (ref 0–5)
HCT: 39.1 % (ref 36.0–46.0)
HEMOGLOBIN: 13.4 g/dL (ref 12.0–15.0)
LYMPHS ABS: 2.4 10*3/uL (ref 0.7–4.0)
Lymphocytes Relative: 41 % (ref 12–46)
MCH: 29.7 pg (ref 26.0–34.0)
MCHC: 34.3 g/dL (ref 30.0–36.0)
MCV: 86.7 fL (ref 78.0–100.0)
MONOS PCT: 7 % (ref 3–12)
Monocytes Absolute: 0.4 10*3/uL (ref 0.1–1.0)
NEUTROS ABS: 2.8 10*3/uL (ref 1.7–7.7)
Neutrophils Relative %: 49 % (ref 43–77)
Platelets: 251 10*3/uL (ref 150–400)
RBC: 4.51 MIL/uL (ref 3.87–5.11)
RDW: 12.8 % (ref 11.5–15.5)
WBC: 5.8 10*3/uL (ref 4.0–10.5)

## 2013-11-30 LAB — BASIC METABOLIC PANEL
BUN: 13 mg/dL (ref 6–23)
CALCIUM: 9.2 mg/dL (ref 8.4–10.5)
CO2: 25 mEq/L (ref 19–32)
CREATININE: 0.9 mg/dL (ref 0.50–1.10)
Chloride: 109 mEq/L (ref 96–112)
Glucose, Bld: 90 mg/dL (ref 70–99)
Potassium: 4.2 mEq/L (ref 3.5–5.3)
Sodium: 140 mEq/L (ref 135–145)

## 2013-11-30 LAB — TSH: TSH: 0.217 u[IU]/mL — AB (ref 0.350–4.500)

## 2013-11-30 LAB — LIPID PANEL
CHOL/HDL RATIO: 2.9 ratio
CHOLESTEROL: 145 mg/dL (ref 0–200)
HDL: 50 mg/dL (ref >39)
LDL CALC: 77 mg/dL (ref 0–99)
TRIGLYCERIDES: 90 mg/dL (ref <150)
VLDL: 18 mg/dL (ref 0–40)

## 2013-11-30 LAB — HEMOGLOBIN A1C
HEMOGLOBIN A1C: 5.8 % — AB (ref <5.7)
MEAN PLASMA GLUCOSE: 120 mg/dL — AB (ref <117)

## 2013-12-01 LAB — VITAMIN D 25 HYDROXY (VIT D DEFICIENCY, FRACTURES): Vit D, 25-Hydroxy: 16 ng/mL — ABNORMAL LOW (ref 30–89)

## 2013-12-02 LAB — T3, FREE: T3, Free: 2.9 pg/mL (ref 2.3–4.2)

## 2013-12-02 LAB — T4, FREE: Free T4: 0.93 ng/dL (ref 0.80–1.80)

## 2013-12-03 ENCOUNTER — Other Ambulatory Visit: Payer: Self-pay

## 2013-12-03 MED ORDER — VITAMIN D (ERGOCALCIFEROL) 1.25 MG (50000 UNIT) PO CAPS: 50000.0000 [IU] | ORAL_CAPSULE | ORAL | Status: DC

## 2014-01-07 ENCOUNTER — Other Ambulatory Visit: Payer: Self-pay | Admitting: Family Medicine

## 2014-01-10 ENCOUNTER — Emergency Department (HOSPITAL_COMMUNITY)
Admission: EM | Admit: 2014-01-10 | Discharge: 2014-01-10 | Disposition: A | Discharge status: Home / Self Care | Payer: BC Managed Care – PPO | Attending: Emergency Medicine | Admitting: Emergency Medicine

## 2014-01-10 ENCOUNTER — Emergency Department (HOSPITAL_COMMUNITY): Payer: BC Managed Care – PPO

## 2014-01-10 ENCOUNTER — Encounter (HOSPITAL_COMMUNITY): Payer: Self-pay | Admitting: Emergency Medicine

## 2014-01-10 ENCOUNTER — Telehealth: Payer: Self-pay | Admitting: Family Medicine

## 2014-01-10 DIAGNOSIS — Z8659 Personal history of other mental and behavioral disorders: Secondary | ICD-10-CM | POA: Insufficient documentation

## 2014-01-10 DIAGNOSIS — R21 Rash and other nonspecific skin eruption: Secondary | ICD-10-CM | POA: Insufficient documentation

## 2014-01-10 DIAGNOSIS — G4459 Other complicated headache syndrome: Secondary | ICD-10-CM | POA: Insufficient documentation

## 2014-01-10 DIAGNOSIS — R42 Dizziness and giddiness: Secondary | ICD-10-CM | POA: Insufficient documentation

## 2014-01-10 DIAGNOSIS — G43109 Migraine with aura, not intractable, without status migrainosus: Secondary | ICD-10-CM

## 2014-01-10 DIAGNOSIS — Z8719 Personal history of other diseases of the digestive system: Secondary | ICD-10-CM | POA: Insufficient documentation

## 2014-01-10 DIAGNOSIS — Z8619 Personal history of other infectious and parasitic diseases: Secondary | ICD-10-CM | POA: Insufficient documentation

## 2014-01-10 DIAGNOSIS — H539 Unspecified visual disturbance: Secondary | ICD-10-CM | POA: Insufficient documentation

## 2014-01-10 DIAGNOSIS — I10 Essential (primary) hypertension: Secondary | ICD-10-CM | POA: Insufficient documentation

## 2014-01-10 DIAGNOSIS — Z79899 Other long term (current) drug therapy: Secondary | ICD-10-CM | POA: Insufficient documentation

## 2014-01-10 LAB — COMPREHENSIVE METABOLIC PANEL
ALBUMIN: 3.8 g/dL (ref 3.5–5.2)
ALK PHOS: 168 U/L — AB (ref 39–117)
ALT: 11 U/L (ref 0–35)
AST: 13 U/L (ref 0–37)
BUN: 14 mg/dL (ref 6–23)
CO2: 21 mEq/L (ref 19–32)
CREATININE: 0.92 mg/dL (ref 0.50–1.10)
Calcium: 9 mg/dL (ref 8.4–10.5)
Chloride: 106 mEq/L (ref 96–112)
GFR calc non Af Amer: 70 mL/min — ABNORMAL LOW (ref >90)
GFR, EST AFRICAN AMERICAN: 82 mL/min — AB (ref >90)
GLUCOSE: 113 mg/dL — AB (ref 70–99)
POTASSIUM: 4.1 meq/L (ref 3.7–5.3)
Sodium: 140 mEq/L (ref 137–147)
TOTAL PROTEIN: 7.1 g/dL (ref 6.0–8.3)
Total Bilirubin: 0.2 mg/dL — ABNORMAL LOW (ref 0.3–1.2)

## 2014-01-10 LAB — DIFFERENTIAL
Basophils Absolute: 0 10*3/uL (ref 0.0–0.1)
Basophils Relative: 0 % (ref 0–1)
EOS ABS: 0.1 10*3/uL (ref 0.0–0.7)
Eosinophils Relative: 2 % (ref 0–5)
Lymphocytes Relative: 36 % (ref 12–46)
Lymphs Abs: 2.1 10*3/uL (ref 0.7–4.0)
MONO ABS: 0.5 10*3/uL (ref 0.1–1.0)
MONOS PCT: 9 % (ref 3–12)
NEUTROS PCT: 53 % (ref 43–77)
Neutro Abs: 3.1 10*3/uL (ref 1.7–7.7)

## 2014-01-10 LAB — URINALYSIS, ROUTINE W REFLEX MICROSCOPIC
Bilirubin Urine: NEGATIVE
Glucose, UA: NEGATIVE mg/dL
Hgb urine dipstick: NEGATIVE
KETONES UR: NEGATIVE mg/dL
LEUKOCYTES UA: NEGATIVE
NITRITE: NEGATIVE
PH: 5.5 (ref 5.0–8.0)
Protein, ur: NEGATIVE mg/dL
Specific Gravity, Urine: 1.025 (ref 1.005–1.030)
Urobilinogen, UA: 0.2 mg/dL (ref 0.0–1.0)

## 2014-01-10 LAB — CBC
HCT: 43.1 % (ref 36.0–46.0)
HEMOGLOBIN: 14.3 g/dL (ref 12.0–15.0)
MCH: 29.7 pg (ref 26.0–34.0)
MCHC: 33.2 g/dL (ref 30.0–36.0)
MCV: 89.6 fL (ref 78.0–100.0)
Platelets: 243 10*3/uL (ref 150–400)
RBC: 4.81 MIL/uL (ref 3.87–5.11)
RDW: 11.8 % (ref 11.5–15.5)
WBC: 5.8 10*3/uL (ref 4.0–10.5)

## 2014-01-10 LAB — I-STAT TROPONIN, ED: TROPONIN I, POC: 0 ng/mL (ref 0.00–0.08)

## 2014-01-10 LAB — I-STAT CHEM 8, ED
BUN: 13 mg/dL (ref 6–23)
CALCIUM ION: 1.16 mmol/L (ref 1.12–1.23)
CHLORIDE: 113 meq/L — AB (ref 96–112)
Creatinine, Ser: 0.9 mg/dL (ref 0.50–1.10)
GLUCOSE: 111 mg/dL — AB (ref 70–99)
HEMATOCRIT: 44 % (ref 36.0–46.0)
HEMOGLOBIN: 15 g/dL (ref 12.0–15.0)
Potassium: 4 mEq/L (ref 3.7–5.3)
Sodium: 139 mEq/L (ref 137–147)
TCO2: 18 mmol/L (ref 0–100)

## 2014-01-10 LAB — PROTIME-INR
INR: 0.98 (ref 0.00–1.49)
Prothrombin Time: 13 seconds (ref 11.6–15.2)

## 2014-01-10 LAB — APTT: aPTT: 28 seconds (ref 24–37)

## 2014-01-10 LAB — ETHANOL

## 2014-01-10 MED ORDER — MECLIZINE HCL 12.5 MG PO TABS
25.0000 mg | ORAL_TABLET | Freq: Once | ORAL | Status: AC
  Administered 2014-01-10: 25 mg via ORAL
  Filled 2014-01-10: qty 2

## 2014-01-10 MED ORDER — MECLIZINE HCL 25 MG PO TABS: 25.0000 mg | ORAL_TABLET | Freq: Four times a day (QID) | ORAL | Status: DC

## 2014-01-10 MED ORDER — ONDANSETRON HCL 4 MG/2ML IJ SOLN
4.0000 mg | Freq: Once | INTRAMUSCULAR | Status: AC
  Administered 2014-01-10: 4 mg via INTRAVENOUS

## 2014-01-10 MED ORDER — ONDANSETRON HCL 4 MG/2ML IJ SOLN
4.0000 mg | Freq: Once | INTRAMUSCULAR | Status: DC
Start: 2014-01-10 — End: 2014-01-10
  Filled 2014-01-10: qty 2

## 2014-01-10 NOTE — Telephone Encounter (Signed)
Patient presented to the office from the ED and was given follow up appt for 6/30

## 2014-01-10 NOTE — ED Provider Notes (Signed)
CSN: [id number]     Arrival date & time 01/10/14  5956 History  This chart was scribed for Fredia Sorrow, MD by Elby Beck, ED Scribe. This patient was seen in room APA14/APA14 and the patient's care was started at 9:25 AM.   Chief Complaint  Patient presents with  . Dizziness    Patient is a 53 y.o. female presenting with dizziness. The history is provided by the patient. No language interpreter was used.  Dizziness Quality:  Room spinning Severity:  Severe Onset quality:  Gradual Duration:  7 hours Timing:  Intermittent Progression:  Waxing and waning Chronicity:  Recurrent (history of vertigo 2+ years ago) Context comment:  Pt had a headache yesterday Relieved by:  None tried Worsened by:  Nothing tried Ineffective treatments:  None tried Associated symptoms: headaches and vision changes   Associated symptoms: no chest pain, no diarrhea, no nausea, no shortness of breath, no vomiting and no weakness   Risk factors: hx of vertigo     HPI Comments: Latoya Decker is a 53 y.o. female with a who presents to the Emergency Department complaining of intermittent dizziness onset about 7.5 hours ago, at 2:00 AM. She describes the dizziness as a "room-spinning" sensation. She states that she has a history of vertigo (2+ years ago) which felt similar. She reports an associated headache- which began at 6:00 AM yesterday and resolved at 3:00 PM yesterday. She locates the pain she had with this headache to her forehead area, and states that she has had similar headaches in the past. She expresses concern that although she has had similar vertigo-like symptoms and similar headaches in the past, these symptoms have seemed unrelated until now. She also expresses concern that she had a 10-15 minute episode of "complete loss of vision" in both eyes yesterday ay 7:30 AM. She also states that she had a complete loss of speech yesterday during those 10 minutes.  She states that her speech and vision  returned to normal after that 10 minute episode. She states that she has occasionally had blurred vision with prior headaches, but she denies any prior history of losing her vision/speech. She denies associated nausea, emesis or weakness or numbness in any of her extremities. She states that she has taken Antivert in the past.  PCP- Dr. Moshe Cipro   Past Medical History  Diagnosis Date  . Anxiety   . Depression   . Headache(784.0)     migraines  . Duodenal ulcer 01/05/2010    EGD Dr Dierdre Searles, h pylori gastritis, completed treatment  . Duodenitis   . Gastritis     hospitalized 6/23-6/25 2011   . PUD (peptic ulcer disease)     Dr Tamala Julian  . S/P colonoscopy 12/27/2003    Dr Voncille Lo  . Family hx of colon cancer     Mother & sister  . Helicobacter pylori gastritis   . S/P endoscopy 12/27/2003    Dr [facility] superficial ulcers GEJ, duodenitis,gastritis  . Hypertension   . Eosinophilic gastroenteritis JAN 2015    [place] 2014: NL CMP/GIARDIA Ag   Past Surgical History  Procedure Laterality Date  . Bilateral tubal ligation  1989  . Partial hysterectomy  2007    1 ovary removed  . Cholecystectomy  1997    aph-Smith  . Tubal ligation    . Polypectomy  06/10/2011    Procedure: POLYPECTOMY;  Surgeon: Dorothyann Peng, MD;  Location: [facility];  Service: Endoscopy;;  Cecal polypectomy; Sigmoid colon polypectomies  .  Esophageal biopsy  06/10/2011    SLF: mild gastritis, bx benign, sb bx benign   . Abdominal hysterectomy    . Colonoscopy  05/2011    SLF: multiple hyperplastic sessile polyps, scattered diverticulosis. next TCS 05/2016 ([facility])  . Esophagogastroduodenoscopy N/A 0/99/8338    EOS INOPHILIC GASTROENTERITIS   Family History  Problem Relation Age of Onset  . Colon cancer Mother 9  . Diabetes Mother   . Hypertension Mother   . Stroke Mother   . Heart attack Father   . Diabetes Father   . Hypertension Father   . Colon cancer Sister 26  . Lupus Brother   . Parkinsonism Brother    . Anesthesia problems Neg Hx   . Hypotension Neg Hx   . Malignant hyperthermia Neg Hx   . Pseudochol deficiency Neg Hx    History  Substance Use Topics  . Smoking status: Never Smoker   . Smokeless tobacco: Not on file     Comment: Never smoker  . Alcohol Use: No   OB History   Grav Para Term Preterm Abortions TAB SAB Ect Mult Living                 Review of Systems  Constitutional: Negative for fever and chills.  HENT: Negative for rhinorrhea and sore throat.   Eyes: Positive for visual disturbance (resolved).  Respiratory: Negative for cough and shortness of breath.   Cardiovascular: Negative for chest pain and leg swelling.  Gastrointestinal: Negative for nausea, vomiting, abdominal pain and diarrhea.  Genitourinary: Negative for dysuria.  Musculoskeletal: Negative for back pain and neck pain.  Skin: Positive for rash.  Neurological: Positive for dizziness, speech difficulty (resolved) and headaches. Negative for weakness and numbness.  Hematological: Does not bruise/bleed easily.  Psychiatric/Behavioral: Negative for confusion.    Allergies  Codeine; Hydrocodone; and Lidocaine viscous  Home Medications   Prior to Admission medications   Medication Sig Start Date End Date Taking? Authorizing Provider  amLODipine (NORVASC) 2.5 MG tablet Take 2.5 mg by mouth daily.   Yes Historical Provider, MD  loratadine (CLARITIN REDITABS) 10 MG dissolvable tablet Take 1 tablet (10 mg total) by mouth daily. 11/24/13  Yes Fayrene Helper, MD  olopatadine (PATANOL) 0.1 % ophthalmic solution Place 1 drop into both eyes 2 (two) times daily. 11/24/13  Yes Fayrene Helper, MD  topiramate (TOPAMAX) 100 MG tablet Take 100 mg by mouth daily.   Yes Historical Provider, MD  Vitamin D, Ergocalciferol, (DRISDOL) 50000 UNITS CAPS capsule Take 1 capsule (50,000 Units total) by mouth every 7 (seven) days. 12/03/13  Yes Fayrene Helper, MD  meclizine (ANTIVERT) 25 MG tablet Take 1 tablet (25  mg total) by mouth 4 (four) times daily. 01/10/14   Fredia Sorrow, MD   Triage Vitals: BP 137/89  Pulse 89  Temp(Src) 98.4 F (36.9 C)  Resp 18  Ht 5\' 7"  (1.702 m)  Wt 180 lb (81.647 kg)  BMI 28.19 kg/m2  SpO2 99%  Physical Exam  Nursing note and vitals reviewed. Constitutional: She is oriented to person, place, and time. She appears well-developed and well-nourished. No distress.  HENT:  Head: Normocephalic and atraumatic.  Eyes: Conjunctivae and EOM are normal.  Neck: Neck supple. No tracheal deviation present.  Cardiovascular: Normal rate, regular rhythm and normal heart sounds.   No murmur heard. Pulmonary/Chest: Effort normal and breath sounds normal. No respiratory distress. She has no wheezes. She has no rales.  Lungs CTA bilaterally  Abdominal: Soft.  Bowel sounds are normal. There is no tenderness.  Musculoskeletal: Normal range of motion. She exhibits no edema.  No swelling in ankles  Neurological: She is alert and oriented to person, place, and time.  Skin: Skin is warm and dry.  Psychiatric: She has a normal mood and affect. Her behavior is normal.    ED Course  Procedures (including critical care time)  DIAGNOSTIC STUDIES: Oxygen Saturation is 99% on RA, normal by my interpretation.    COORDINATION OF CARE: 9:30 AM- Discussed plan to obtain an brain MR and a CT of pt's head along with diagnostic lab work. Pt advised of plan for treatment and pt agrees.  Medications  meclizine (ANTIVERT) tablet 25 mg (25 mg Oral Given 01/10/14 1129)  ondansetron (ZOFRAN) injection 4 mg (4 mg Intravenous Given 01/10/14 1326)   Labs Review Labs Reviewed  COMPREHENSIVE METABOLIC PANEL - Abnormal; Notable for the following:    Glucose, Bld 113 (*)    Alkaline Phosphatase 168 (*)    Total Bilirubin 0.2 (*)    GFR calc non Af Amer 70 (*)    GFR calc Af Amer 82 (*)    All other components within normal limits  I-STAT CHEM 8, ED - Abnormal; Notable for the following:     Chloride 113 (*)    Glucose, Bld 111 (*)    All other components within normal limits  ETHANOL  PROTIME-INR  APTT  CBC  DIFFERENTIAL  URINALYSIS, ROUTINE W REFLEX MICROSCOPIC  I-STAT TROPOININ, ED  I-STAT TROPOININ, ED   Results for orders placed during the hospital encounter of 01/10/14  ETHANOL      Result Value Ref Range   Alcohol, Ethyl (B) <11  0 - 11 mg/dL  PROTIME-INR      Result Value Ref Range   Prothrombin Time 13.0  11.6 - 15.2 seconds   INR 0.98  0.00 - 1.49  APTT      Result Value Ref Range   aPTT 28  24 - 37 seconds  CBC      Result Value Ref Range   WBC 5.8  4.0 - 10.5 K/uL   RBC 4.81  3.87 - 5.11 MIL/uL   Hemoglobin 14.3  12.0 - 15.0 g/dL   HCT 43.1  36.0 - 46.0 %   MCV 89.6  78.0 - 100.0 fL   MCH 29.7  26.0 - 34.0 pg   MCHC 33.2  30.0 - 36.0 g/dL   RDW 11.8  11.5 - 15.5 %   Platelets 243  150 - 400 K/uL  DIFFERENTIAL      Result Value Ref Range   Neutrophils Relative % 53  43 - 77 %   Neutro Abs 3.1  1.7 - 7.7 K/uL   Lymphocytes Relative 36  12 - 46 %   Lymphs Abs 2.1  0.7 - 4.0 K/uL   Monocytes Relative 9  3 - 12 %   Monocytes Absolute 0.5  0.1 - 1.0 K/uL   Eosinophils Relative 2  0 - 5 %   Eosinophils Absolute 0.1  0.0 - 0.7 K/uL   Basophils Relative 0  0 - 1 %   Basophils Absolute 0.0  0.0 - 0.1 K/uL  COMPREHENSIVE METABOLIC PANEL      Result Value Ref Range   Sodium 140  137 - 147 mEq/L   Potassium 4.1  3.7 - 5.3 mEq/L   Chloride 106  96 - 112 mEq/L   CO2 21  19 - 32 mEq/L  Glucose, Bld 113 (*) 70 - 99 mg/dL   BUN 14  6 - 23 mg/dL   Creatinine, Ser 0.92  0.50 - 1.10 mg/dL   Calcium 9.0  8.4 - 10.5 mg/dL   Total Protein 7.1  6.0 - 8.3 g/dL   Albumin 3.8  3.5 - 5.2 g/dL   AST 13  0 - 37 U/L   ALT 11  0 - 35 U/L   Alkaline Phosphatase 168 (*) 39 - 117 U/L   Total Bilirubin 0.2 (*) 0.3 - 1.2 mg/dL   GFR calc non Af Amer 70 (*) >90 mL/min   GFR calc Af Amer 82 (*) >90 mL/min  URINALYSIS, ROUTINE W REFLEX MICROSCOPIC      Result Value  Ref Range   Color, Urine YELLOW  YELLOW   APPearance CLEAR  CLEAR   Specific Gravity, Urine 1.025  1.005 - 1.030   pH 5.5  5.0 - 8.0   Glucose, UA NEGATIVE  NEGATIVE mg/dL   Hgb urine dipstick NEGATIVE  NEGATIVE   Bilirubin Urine NEGATIVE  NEGATIVE   Ketones, ur NEGATIVE  NEGATIVE mg/dL   Protein, ur NEGATIVE  NEGATIVE mg/dL   Urobilinogen, UA 0.2  0.0 - 1.0 mg/dL   Nitrite NEGATIVE  NEGATIVE   Leukocytes, UA NEGATIVE  NEGATIVE  I-STAT CHEM 8, ED      Result Value Ref Range   Sodium 139  137 - 147 mEq/L   Potassium 4.0  3.7 - 5.3 mEq/L   Chloride 113 (*) 96 - 112 mEq/L   BUN 13  6 - 23 mg/dL   Creatinine, Ser 0.90  0.50 - 1.10 mg/dL   Glucose, Bld 111 (*) 70 - 99 mg/dL   Calcium, Ion 1.16  1.12 - 1.23 mmol/L   TCO2 18  0 - 100 mmol/L   Hemoglobin 15.0  12.0 - 15.0 g/dL   HCT 44.0  36.0 - 46.0 %  I-STAT TROPOININ, ED      Result Value Ref Range   Troponin i, poc 0.00  0.00 - 0.08 ng/mL   Comment 3              Imaging Review Ct Head Wo Contrast  01/10/2014   CLINICAL DATA:  Dizziness and visual disturbances since yesterday  EXAM: CT HEAD WITHOUT CONTRAST  TECHNIQUE: Contiguous axial images were obtained from the base of the skull through the vertex without intravenous contrast.  COMPARISON:  Noncontrast CT scan of the brain of Dec 10, 2011  FINDINGS: The ventricles are normal in size and position. There is stable basal ganglia calcification on the right. There is no intracranial hemorrhage. Nor intracranial mass effect. There is no acute ischemic change. The cerebellum and brainstem are normal in density. The globes are normal in appearance.  There is no lytic nor blastic lesion. The paranasal sinuses and mastoid air cells are clear.  IMPRESSION: There is no acute intracranial abnormality.   Electronically Signed   By: David  Martinique   On: 01/10/2014 10:54   Mr Brain Wo Contrast  01/10/2014   CLINICAL DATA:  Dizziness.  Vertigo  EXAM: MRI HEAD WITHOUT CONTRAST  TECHNIQUE:  Multiplanar, multiecho pulse sequences of the brain and surrounding structures were obtained without intravenous contrast.  COMPARISON:  CT 01/10/2014  FINDINGS: Ventricle size is normal. Craniocervical junction is normal. Pituitary normal in size.  Negative for acute infarct.  Small subcortical white matter hyperintensities, with mild progression from 2010 MRI. These findings are consistent with chronic microvascular ischemia.  Brainstem and cerebellum are intact.  Negative for hemorrhage or mass.  Negative for edema.  Paranasal sinuses are clear.  IMPRESSION: Mild chronic microvascular ischemia.  No acute abnormality.   Electronically Signed   By: Franchot Gallo M.D.   On: 01/10/2014 12:55     EKG Interpretation   Date/Time:  Monday January 10 2014 09:43:19 EDT Ventricular Rate:  69 PR Interval:  142 QRS Duration: 83 QT Interval:  395 QTC Calculation: 423 R Axis:   17 Text Interpretation:  Sinus rhythm Low voltage, precordial leads No  significant change since last tracing Confirmed by Angelos Wasco  MD, Stormey Wilborn  ([id number]) on 01/10/2014 9:49:09 AM      MDM   Final diagnoses:  Vertigo  Complicated migraine      patient with history of migraines. Headache is starting yesterday morning around 6. Around 7:30 in the morning had the liking out of vision. Last for 10 minutes. Patient never had that with migraines before patient then later started with vertigo kind of symptoms. True room spinning. Did not have headache and vertigo at the same time. All visual changes have resolved.  Today's workup MRI without evidence of any acute stroke. Or abnormality. Patient can be discharged home on Antivert and followup with her primary care Dr. for additional workup.  I personally performed the services described in this documentation, which was scribed in my presence. The recorded information has been reviewed and is accurate.     Fredia Sorrow, MD 01/10/14 1359

## 2014-01-10 NOTE — ED Notes (Signed)
Headache starting yesterday morning around 0600.  Reports loss of vision around 0730 for approx 10 minutes yesterday.  Reports relief of headache and return of vision but has been having dizzy spells since.

## 2014-01-10 NOTE — Discharge Instructions (Signed)
Benign Positional Vertigo Vertigo means you feel like you or your surroundings are moving when they are not. Benign positional vertigo is the most common form of vertigo. Benign means that the cause of your condition is not serious. Benign positional vertigo is more common in older adults. CAUSES  Benign positional vertigo is the result of an upset in the labyrinth system. This is an area in the middle ear that helps control your balance. This may be caused by a viral infection, head injury, or repetitive motion. However, often no specific cause is found. SYMPTOMS  Symptoms of benign positional vertigo occur when you move your head or eyes in different directions. Some of the symptoms may include:  Loss of balance and falls.  Vomiting.  Blurred vision.  Dizziness.  Nausea.  Involuntary eye movements (nystagmus). DIAGNOSIS  Benign positional vertigo is usually diagnosed by physical exam. If the specific cause of your benign positional vertigo is unknown, your caregiver may perform imaging tests, such as magnetic resonance imaging (MRI) or computed tomography (CT). TREATMENT  Your caregiver may recommend movements or procedures to correct the benign positional vertigo. Medicines such as meclizine, benzodiazepines, and medicines for nausea may be used to treat your symptoms. In rare cases, if your symptoms are caused by certain conditions that affect the inner ear, you may need surgery. HOME CARE INSTRUCTIONS   Follow your caregiver's instructions.  Move slowly. Do not make sudden body or head movements.  Avoid driving.  Avoid operating heavy machinery.  Avoid performing any tasks that would be dangerous to you or others during a vertigo episode.  Drink enough fluids to keep your urine clear or pale yellow. SEEK IMMEDIATE MEDICAL CARE IF:   You develop problems with walking, weakness, numbness, or using your arms, hands, or legs.  You have difficulty speaking.  You develop  severe headaches.  Your nausea or vomiting continues or gets worse.  You develop visual changes.  Your family or friends notice any behavioral changes.  Your condition gets worse.  You have a fever.  You develop a stiff neck or sensitivity to light. MAKE SURE YOU:   Understand these instructions.  Will watch your condition.  Will get help right away if you are not doing well or get worse. Document Released: 04/08/2006 Document Revised: 09/23/2011 Document Reviewed: 03/21/2011 [facility] Patient Information 2015 [place], [place]. This information is not intended to replace advice given to you by your health care provider. Make sure you discuss any questions you have with your health care provider.  Return for any newer worse symptoms. Particularly return for any loss of vision that becomes persistent. Recommended she do followup with your regular Dr. for additional workup. We are thinking this was a complicated migraine. New symptoms you're having now are related to vertigo. Take the Antivert as directed.

## 2014-01-10 NOTE — ED Notes (Signed)
Pt medicated for nausea

## 2014-01-11 ENCOUNTER — Encounter: Payer: Self-pay | Admitting: Family Medicine

## 2014-01-11 ENCOUNTER — Ambulatory Visit (INDEPENDENT_AMBULATORY_CARE_PROVIDER_SITE_OTHER): Payer: BC Managed Care – PPO | Admitting: Family Medicine

## 2014-01-11 VITALS — BP 130/72 | HR 82 | Resp 18 | Ht 65.5 in | Wt 191.0 lb

## 2014-01-11 DIAGNOSIS — J3089 Other allergic rhinitis: Secondary | ICD-10-CM

## 2014-01-11 DIAGNOSIS — G4700 Insomnia, unspecified: Secondary | ICD-10-CM

## 2014-01-11 DIAGNOSIS — R0989 Other specified symptoms and signs involving the circulatory and respiratory systems: Secondary | ICD-10-CM | POA: Insufficient documentation

## 2014-01-11 DIAGNOSIS — G43809 Other migraine, not intractable, without status migrainosus: Secondary | ICD-10-CM

## 2014-01-11 NOTE — Patient Instructions (Addendum)
F/u as before  You need to see a neurologist for improved management of your headaches , and for further evaluation of the new type of headache you had this past Sunday, which was accompanied by loss of vision and inability to speak.  You are referred for US of the arteries in your neck, to ensure you have no blockage in your neck arteries  You should not return to work until you see a specialist, new work note will be provided, nurse will follow through on this

## 2014-01-11 NOTE — Progress Notes (Signed)
   Subjective:    Patient ID: Latoya Decker, female    DOB: 1960/10/09, 53 y.o.   MRN: [id number]  HPI Pt was awakened from her sleep on 6/28 with severe generalized pounding headache which  Awakened her form her sleep, she for the first time experienced loss of vision and inability to speak , total of 30 min, she did fall asleep for some time during the 30 mins, she was evaluated in the eD. Did have nausea, no emesis, reports she was seen in ed, brain scan is normal, but still feels weak, is extremely concerned about the new disabling headaches which are more severe and more frequent, on avg 3  Per week      Review of Systems See HPI Denies recent fever or chills. Denies sinus pressure, nasal congestion, ear pain or sore throat. Denies chest congestion, productive cough or wheezing. Denies chest pains, palpitations and leg swelling Denies abdominal pain, nausea, vomiting,diarrhea or constipation.   Denies dysuria, frequency, hesitancy or incontinence. Denies joint pain, swelling and limitation in mobility. Denies skin break down or rash.        Objective:   Physical Exam BP 130/72  Pulse 82  Resp 18  Ht 5' 5.5" (1.664 m)  Wt 191 lb 0.6 oz (86.655 kg)  BMI 31.30 kg/m2  SpO2 100% Patient alert and oriented and in no cardiopulmonary distress.Anxious, tearful and in pain  HEENT: No facial asymmetry, EOMI,   oropharynx pink and moist.  Neck supple no JVD, no mass. NO sinus tenderness, TM clear, bilateral bruits PERTL, EOMI, no hemorhage or exudate seen on fundoscopic exam Chest: Clear to auscultation bilaterally.  CVS: S1, S2 no murmurs, no S3.Regular rate.  ABD: Soft non tender.   Ext: No edema  MS: Adequate ROM spine, shoulders, hips and knees.  Skin: Intact, no ulcerations or rash noted.  Psych: Good eye contact, normal affect. Memory intact not anxious or depressed appearing.  CNS: CN 2-12 intact, power,  normal throughout.no focal deficits noted.       Assessment & Plan:  Carotid bruit Carotid doppler ordered  Headache, variant migraine, with status migrainosus New disabling headache accompanied by loss of vision and speech, awakened pt, needs neurology eval and work excuse Exam in office showsd no neurologic deficits  Allergic rhinitis Controlled, no change in medication   Insomnia Sleep hygiene reviewed and written information offered also. Prescription sent for  medication needed.

## 2014-02-02 ENCOUNTER — Ambulatory Visit (INDEPENDENT_AMBULATORY_CARE_PROVIDER_SITE_OTHER): Payer: BC Managed Care – PPO | Admitting: Neurology

## 2014-02-02 ENCOUNTER — Encounter: Payer: Self-pay | Admitting: Neurology

## 2014-02-02 VITALS — BP 122/79 | HR 69 | Resp 16 | Ht 65.5 in | Wt 189.0 lb

## 2014-02-02 DIAGNOSIS — H814 Vertigo of central origin: Secondary | ICD-10-CM

## 2014-02-02 DIAGNOSIS — H02401 Unspecified ptosis of right eyelid: Secondary | ICD-10-CM

## 2014-02-02 DIAGNOSIS — H02409 Unspecified ptosis of unspecified eyelid: Secondary | ICD-10-CM

## 2014-02-02 DIAGNOSIS — IMO0001 Reserved for inherently not codable concepts without codable children: Secondary | ICD-10-CM

## 2014-02-02 DIAGNOSIS — H543 Unqualified visual loss, both eyes: Secondary | ICD-10-CM

## 2014-02-02 DIAGNOSIS — R21 Rash and other nonspecific skin eruption: Secondary | ICD-10-CM

## 2014-02-02 MED ORDER — HYDROXYZINE PAMOATE 25 MG PO CAPS: 25.0000 mg | ORAL_CAPSULE | Freq: Four times a day (QID) | ORAL | Status: AC | PRN

## 2014-02-02 NOTE — Progress Notes (Signed)
[facility]  Provider:  Larey Seat, M D  Referring Provider: Fayrene Helper, MD Primary Care Physician:  [name], MD  Chief Complaint  Patient presents with  . New Evaluation    Room 10  . Migraine    HPI:  Latoya Decker is a 53 y.o. afro-american , right handed female , who is seen here as a referral from Dr. Moshe Cipro for a severe headache evaluation,  The patient  reports that she woke out of  sleep  At 6 AM on 01-09-14 with a generalized severe pounding headache. The headache woke her and she took a Topamax , she went back to sleep for another hour and woke again - this time experienced a brief loss of vision and inability to speak. She woke her husband and went on to try communicate with him, he didn't understand she had speech arrest , it took another 25 minutes before he understood- her sight and speech returned slowly. The middle of the night she developed vertigo , the next morning to the ED.  MRI and CT were normal, laboratory normal, [facility] and for a carotid US, which is still pending.  [facility] she received Botox injections, and she has not had another severe headache since. She feels as if a headache is coming on all the time, her  right eye drooping and her neck being  tense.  Dr .Moshe Cipro referred her here to [facility] for further work up, but the patient is still actively treated at  [facility].   In history of headaches for many years, and that's why she has Topamax at all and has tried many other medications including muscle relaxant medications under the assumption that there is a tension component to her headaches.   The speech arrest and vision changes however suggested  that a stroke could  play a role , an MRI and CT have not confirmed that a stroke happened. She has vertigo with the severest headaches, these  HA are generalized,  not dominantly  to one side.  Phonophobia and nausea set in. She is at times " out of order " for 2-3 days.  Vertigo is perceived as counter clockwise rotation of the room . Lasts up to 3 hours. Relieved by vistaril.  She noted Ptosis of the right eye , with and without headaches, all times of the day, independent of fatigue or Meal times.   The patient has only 5 hours of nocturnal sleep , goes to bed at 9 Pm and rises at 2.30 AM.       She will need to see Dr. Leonie Man or Dr. Erlinda Hong for vascular work up.  Her employment is sedentary, she works on a Psychiatric nurse day long,   [place] [place] [facility] shift work history - 3 AM to 3 PM.    Review of Systems: Out of a complete 14 system review, the patient complains of only the following symptoms, and all other reviewed systems are negative.  Generalized Headaches, vertigo and nausea.   She reports Headaches 2-3 times weekly , but none since 01-14-14 after Botox treatments with Dr Domingo Cocking.    History   Social History  . Marital Status: Married    Spouse Name: Latoya Decker    Number of Children: 3  . Years of Education: College   Occupational History  . Dialysis Tech   .  Social History Main Topics  . Smoking status: Never Smoker   . Smokeless tobacco: Never Used     Comment: Never smoker  . Alcohol Use: No  . Drug Use: No  . Sexual Activity: Yes    Partners: Male    Birth Control/ Protection: Surgical   Other Topics Concern  . Not on file   Social History Narrative   Patient is married Latoya Decker) and lives at home with her husband and her granddaughter.   Patient has three adult children.   Patient works full-time.   Patient has a college education.   Patient is right-handed.   Patient drinks one cup of tea daily.    Family History  Problem Relation Age of Onset  . Colon cancer Mother 10  . Diabetes Mother   . Hypertension Mother   . Stroke Mother   . Heart attack Father   . Diabetes Father   . Hypertension Father   . Colon cancer  Sister 86  . Lupus Brother   . Parkinsonism Brother   . Anesthesia problems Neg Hx   . Hypotension Neg Hx   . Malignant hyperthermia Neg Hx   . Pseudochol deficiency Neg Hx     Past Medical History  Diagnosis Date  . Anxiety   . Depression   . Headache(784.0)     migraines  . Duodenal ulcer 01/05/2010    EGD Dr Dierdre Searles, h pylori gastritis, completed treatment  . Duodenitis   . Gastritis     hospitalized 6/23-6/25 2011   . PUD (peptic ulcer disease)     Dr Tamala Julian  . S/P colonoscopy 12/27/2003    Dr Voncille Lo  . Family hx of colon cancer     Mother & sister  . Helicobacter pylori gastritis   . S/P endoscopy 12/27/2003    Dr [facility] superficial ulcers GEJ, duodenitis,gastritis  . Hypertension   . Eosinophilic gastroenteritis JAN 2015    [place] 2014: [facility] CMP/GIARDIA Ag    Past Surgical History  Procedure Laterality Date  . Bilateral tubal ligation  1989  . Partial hysterectomy  2007    1 ovary removed  . Cholecystectomy  1997    aph-Smith  . Tubal ligation    . Polypectomy  06/10/2011    Procedure: POLYPECTOMY;  Surgeon: Dorothyann Peng, MD;  Location: [facility];  Service: Endoscopy;;  Cecal polypectomy; Sigmoid colon polypectomies  . Esophageal biopsy  06/10/2011    SLF: mild gastritis, bx benign, sb bx benign   . Abdominal hysterectomy    . Colonoscopy  05/2011    SLF: multiple hyperplastic sessile polyps, scattered diverticulosis. next TCS 05/2016 ([facility])  . Esophagogastroduodenoscopy N/A 01/15/5008    EOS INOPHILIC GASTROENTERITIS    Current Outpatient Prescriptions  Medication Sig Dispense Refill  . amLODipine (NORVASC) 2.5 MG tablet Take 2.5 mg by mouth daily.      . baclofen (LIORESAL) 10 MG tablet Take 10 mg by mouth. Taking 2 tablets daily      . loratadine (CLARITIN REDITABS) 10 MG dissolvable tablet Take 1 tablet (10 mg total) by mouth daily.  30 tablet  4  . meclizine (ANTIVERT) 25 MG tablet Take 1 tablet (25 mg total) by mouth 4 (four) times daily.  28  tablet  0  . olopatadine (PATANOL) 0.1 % ophthalmic solution Place 1 drop into both eyes 2 (two) times daily.  5 mL  2  . temazepam (RESTORIL) 30 MG capsule TAKE 1 CAPSULE BY MOUTH EVERY NIGHT AT BEDTIME  AS NEEDED FOR SLEEP  30 capsule  1  . topiramate (TOPAMAX) 100 MG tablet Take 100 mg by mouth daily.      . Vitamin D, Ergocalciferol, (DRISDOL) 50000 UNITS CAPS capsule Take 1 capsule (50,000 Units total) by mouth every 7 (seven) days.  4 capsule  5   No current facility-administered medications for this visit.    Allergies as of 02/02/2014 - Review Complete 02/02/2014  Allergen Reaction Noted  . Codeine Nausea And Vomiting 06/30/2008  . Hydrocodone Nausea And Vomiting 01/12/2010  . Lidocaine viscous  08/25/2013    Vitals: BP 122/79  Pulse 69  Resp 16  Ht 5' 5.5" (1.664 m)  Wt 189 lb (85.73 kg)  BMI 30.96 kg/m2 Last Weight:  Wt Readings from Last 1 Encounters:  02/02/14 189 lb (85.73 kg)   Last Height:   Ht Readings from Last 1 Encounters:  02/02/14 5' 5.5" (1.664 m)    Physical exam:  General: The patient is awake, alert and appears not in acute distress. The patient is well groomed. Head: Normocephalic, atraumatic. Neck is supple. Mallampati 3 , neck circumference: 16 inches, mild retrognathia.  Cardiovascular:  Regular rate and rhythm , without  murmurs or carotid bruit, and without distended neck veins. Respiratory: Lungs are clear to auscultation. Skin:  Without evidence of edema, or rash Trunk: BMI is  elevated and patient  has normal posture.  Neurologic exam : The patient is awake and alert, oriented to place and time.  Memory subjective described as intact.  During the "aura ' phase of her headaches she is often inattentive, confused. There is a normal attention span & concentration ability. Speech is fluent without  dysarthria, dysphonia or aphasia. Mood and affect are appropriate.  Cranial nerves: Pupils are equal and briskly reactive to light. Funduscopic  exam without evidence of pallor or edema.  Extraocular movements  in vertical and horizontal planes intact and without nystagmus. Visual fields by finger perimetry are intact. Hearing to finger rub intact.  Facial sensation intact to fine touch. Facial motor strength is symmetric and tongue and uvula move midline.  Motor exam:   Normal tone , muscle bulk and symmetric normal strength in all extremities.  Sensory:  Fine touch, pinprick and vibration were tested in all extremities. Proprioception is tested in the upper extremities only. This was  normal.  Coordination: Rapid alternating movements in the fingers/hands is tested and normal. Finger-to-nose maneuver tested and normal without evidence of ataxia, dysmetria or tremor.  Gait and station: Patient walks without assistive device ,climbs up to the exam table.  Strength within normal limits. Stance is stable and normal. Tandem gait is  unfragmented. Romberg testing is normal.  Deep tendon reflexes: in the  upper and lower extremities are symmetric and intact. Babinski maneuver response is downgoing.   Assessment:  After physical and neurologic examination, review of laboratory studies, imaging, neurophysiology testing and pre-existing records, assessment is   Vascular headaches , associated with Vertigo, nausea and phonophobia, vision changes.  MRI reportedly normal.  Speech arrest and vision loss lasted for 30 minutes, one occurrence. TIA or seizure suspected.   Plan:  Treatment plan and additional workup : severe vascular headaches   Continue topamax 100 mg at night . US carotid and TCD. One Electrolyte drink a day  , keep hydrating with water . Eliminate caffeine. Dr Camillia Herter labs were reviewed.

## 2014-02-02 NOTE — Patient Instructions (Addendum)
Transient Ischemic Attack A transient ischemic attack (TIA) is a "warning stroke" that causes stroke-like symptoms. Unlike a stroke, a TIA does not cause permanent damage to the brain. The symptoms of a TIA can happen very fast and do not last long. It is important to know the symptoms of a TIA and what to do. This can help prevent a major stroke or death. CAUSES   A TIA is caused by a temporary blockage in an artery in the brain or neck (carotid artery). The blockage does not allow the brain to get the blood supply it needs and can cause different symptoms. The blockage can be caused by either:  A blood clot.  Fatty buildup (plaque) in a neck or brain artery. RISK FACTORS  High blood pressure (hypertension).  High cholesterol.  Diabetes mellitus.  Heart disease.  The build up of plaque in the blood vessels (peripheral artery disease or atherosclerosis).  The build up of plaque in the blood vessels providing blood and oxygen to the brain (carotid artery stenosis).  An abnormal heart rhythm (atrial fibrillation).  Obesity.  Smoking.  Taking oral contraceptives (especially in combination with smoking).  Physical inactivity.  A diet high in fats, salt (sodium), and calories.  Alcohol use.  Use of illegal drugs (especially cocaine and methamphetamine).  Being female.  Being African American.  Being over the age of 70.  Family history of stroke.  Previous history of blood clots, stroke, TIA, or heart attack.  Sickle cell disease. SYMPTOMS  TIA symptoms are the same as a stroke but are temporary. These symptoms usually develop suddenly, or may be newly present upon awakening from sleep:  Sudden weakness or numbness of the face, arm, or leg, especially on one side of the body.  Sudden trouble walking or difficulty moving arms or legs.  Sudden confusion.  Sudden personality changes.  Trouble speaking (aphasia) or understanding.  Difficulty swallowing.  Sudden  trouble seeing in one or both eyes.  Double vision.  Dizziness.  Loss of balance or coordination.  Sudden severe headache with no known cause.  Trouble reading or writing.  Loss of bowel or bladder control.  Loss of consciousness. DIAGNOSIS  Your caregiver may be able to determine the presence or absence of a TIA based on your symptoms, history, and physical exam. Computed tomography (CT scan) of the brain is usually performed to help identify a TIA. Other tests may be done to diagnose a TIA. These tests may include:  Electrocardiography.  Continuous heart monitoring.  Echocardiography.  Carotid ultrasonography.  Magnetic resonance imaging (MRI).  A scan of the brain circulation.  Blood tests. PREVENTION  The risk of a TIA can be decreased by appropriately treating high blood pressure, high cholesterol, diabetes, heart disease, and obesity and by quitting smoking, limiting alcohol, and staying physically active. TREATMENT  Time is of the essence. Since the symptoms of TIA are the same as a stroke, it is important to seek treatment as soon as possible because you may need a medicine to dissolve the clot (thrombolytic) that cannot be given if too much time has passed. Treatment options vary. Treatment options may include rest, oxygen, intravenous (IV) fluids, and medicines to thin the blood (anticoagulants). Medicines and diet may be used to address diabetes, high blood pressure, and other risk factors. Measures will be taken to prevent short-term and long-term complications, including infection from breathing foreign material into the lungs (aspiration pneumonia), blood clots in the legs, and falls. Treatment options include procedures  to either remove plaque in the carotid arteries or dilate carotid arteries that have narrowed due to plaque. Those procedures are:  Carotid endarterectomy.  Carotid angioplasty and stenting. HOME CARE INSTRUCTIONS   Take all medicines prescribed  by your caregiver. Follow the directions carefully. Medicines may be used to control risk factors for a stroke. Be sure you understand all your medicine instructions.  You may be told to take aspirin or the anticoagulant warfarin. Warfarin needs to be taken exactly as instructed.  Taking too much or too little warfarin is dangerous. Too much warfarin increases the risk of bleeding. Too little warfarin continues to allow the risk for blood clots. While taking warfarin, you will need to have regular blood tests to measure your blood clotting time. A PT blood test measures how long it takes for blood to clot. Your PT is used to calculate another value called an INR. Your PT and INR help your caregiver to adjust your dose of warfarin. The dose can change for many reasons. It is critically important that you take warfarin exactly as prescribed.  Many foods, especially foods high in vitamin K can interfere with warfarin and affect the PT and INR. Foods high in vitamin K include spinach, kale, broccoli, cabbage, collard and turnip greens, brussels sprouts, peas, cauliflower, seaweed, and parsley as well as beef and pork liver, green tea, and soybean oil. You should eat a consistent amount of foods high in vitamin K. Avoid major changes in your diet, or notify your caregiver before changing your diet. Arrange a visit with a dietitian to answer your questions.  Many medicines can interfere with warfarin and affect the PT and INR. You must tell your caregiver about any and all medicines you take, this includes all vitamins and supplements. Be especially cautious with aspirin and anti-inflammatory medicines. Do not take or discontinue any prescribed or over-the-counter medicine except on the advice of your caregiver or pharmacist.  Warfarin can have side effects, such as excessive bruising or bleeding. You will need to hold pressure over cuts for longer than usual. Your caregiver or pharmacist will discuss other  potential side effects.  Avoid sports or activities that may cause injury or bleeding.  Be mindful when shaving, flossing your teeth, or handling sharp objects.  Alcohol can change the body's ability to handle warfarin. It is best to avoid alcoholic drinks or consume only very small amounts while taking warfarin. Notify your caregiver if you change your alcohol intake.  Notify your dentist or other caregivers before procedures.  Eat a diet that includes 5 or more servings of fruits and vegetables each day. This may reduce the risk of stroke. Certain diets may be prescribed to address high blood pressure, high cholesterol, diabetes, or obesity.  A low-sodium, low-saturated fat, low-trans fat, low-cholesterol diet is recommended to manage high blood pressure.  A low-saturated fat, low-trans fat, low-cholesterol, and high-fiber diet may control cholesterol levels.  A controlled-carbohydrate, controlled-sugar diet is recommended to manage diabetes.  A reduced-calorie, low-sodium, low-saturated fat, low-trans fat, low-cholesterol diet is recommended to manage obesity.  Maintain a healthy weight.  Stay physically active. It is recommended that you get at least 30 minutes of activity on most or all days.  Do not smoke.  Limit alcohol use even if you are not taking warfarin. Moderate alcohol use is considered to be:  No more than 2 drinks each day for men.  No more than 1 drink each day for nonpregnant women.  Stop drug abuse.  Home safety. A safe home environment is important to reduce the risk of falls. Your caregiver may arrange for specialists to evaluate your home. Having grab bars in the bedroom and bathroom is often important. Your caregiver may arrange for equipment to be used at home, such as raised toilets and a seat for the shower.  Follow all instructions for follow-up with your caregiver. This is very important. This includes any referrals and lab tests. Proper follow up can  prevent a stroke or another TIA from occurring. SEEK MEDICAL CARE IF:  You have personality changes.  You have difficulty swallowing.  You are seeing double.  You have dizziness.  You have a fever.  You have skin breakdown. SEEK IMMEDIATE MEDICAL CARE IF:  Any of these symptoms may represent a serious problem that is an emergency. Do not wait to see if the symptoms will go away. Get medical help right away. Call your local emergency services (911 in [place] Do not drive yourself to the hospital.  You have sudden weakness or numbness of the face, arm, or leg, especially on one side of the body.  You have sudden trouble walking or difficulty moving arms or legs.  You have sudden confusion.  You have trouble speaking (aphasia) or understanding.  You have sudden trouble seeing in one or both eyes.  You have a loss of balance or coordination.  You have a sudden, severe headache with no known cause.  You have new chest pain or an irregular heartbeat.  You have a partial or total loss of consciousness. MAKE SURE YOU:   Understand these instructions.  Will watch your condition.  Will get help right away if you are not doing well or get worse. Document Released: 04/10/2005 Document Revised: 07/06/2013 Document Reviewed: 08/24/2009 [facility] Patient Information 2015 [place], [place]. This information is not intended to replace advice given to you by your health care provider. Make sure you discuss any questions you have with your health care provider. Vertigo Vertigo means you feel like you or your surroundings are moving when they are not. Vertigo can be dangerous if it occurs when you are at work, driving, or performing difficult activities.  CAUSES  Vertigo occurs when there is a conflict of signals sent to your brain from the visual and sensory systems in your body. There are many different causes of vertigo, including:  Infections, especially in the inner ear.  A bad  reaction to a drug or misuse of alcohol and medicines.  Withdrawal from drugs or alcohol.  Rapidly changing positions, such as lying down or rolling over in bed.  A migraine headache.  Decreased blood flow to the brain.  Increased pressure in the brain from a head injury, infection, tumor, or bleeding. SYMPTOMS  You may feel as though the world is spinning around or you are falling to the ground. Because your balance is upset, vertigo can cause nausea and vomiting. You may have involuntary eye movements (nystagmus). DIAGNOSIS  Vertigo is usually diagnosed by physical exam. If the cause of your vertigo is unknown, your caregiver may perform imaging tests, such as an MRI scan (magnetic resonance imaging). TREATMENT  Most cases of vertigo resolve on their own, without treatment. Depending on the cause, your caregiver may prescribe certain medicines. If your vertigo is related to body position issues, your caregiver may recommend movements or procedures to correct the problem. In rare cases, if your vertigo is caused by certain inner ear problems, you may need surgery. HOME CARE  INSTRUCTIONS   Follow your caregiver's instructions.  Avoid driving.  Avoid operating heavy machinery.  Avoid performing any tasks that would be dangerous to you or others during a vertigo episode.  Tell your caregiver if you notice that certain medicines seem to be causing your vertigo. Some of the medicines used to treat vertigo episodes can actually make them worse in some people. SEEK IMMEDIATE MEDICAL CARE IF:   Your medicines do not relieve your vertigo or are making it worse.  You develop problems with talking, walking, weakness, or using your arms, hands, or legs.  You develop severe headaches.  Your nausea or vomiting continues or gets worse.  You develop visual changes.  A family member notices behavioral changes.  Your condition gets worse. MAKE SURE YOU:  Understand these  instructions.  Will watch your condition.  Will get help right away if you are not doing well or get worse. Document Released: 04/10/2005 Document Revised: 09/23/2011 Document Reviewed: 01/17/2011 [facility] Patient Information 2015 [place], [place]. This information is not intended to replace advice given to you by your health care provider. Make sure you discuss any questions you have with your health care provider.

## 2014-02-03 LAB — ACETYLCHOLINE RECEPTOR, BINDING

## 2014-02-09 ENCOUNTER — Other Ambulatory Visit: Payer: Self-pay | Admitting: Family Medicine

## 2014-02-23 ENCOUNTER — Ambulatory Visit: Payer: BC Managed Care – PPO | Admitting: Neurology

## 2014-03-15 ENCOUNTER — Encounter: Payer: Self-pay | Admitting: Gastroenterology

## 2014-04-06 ENCOUNTER — Ambulatory Visit: Payer: Self-pay | Admitting: Neurology

## 2014-04-26 ENCOUNTER — Encounter: Payer: Self-pay | Admitting: Family Medicine

## 2014-04-26 ENCOUNTER — Ambulatory Visit (INDEPENDENT_AMBULATORY_CARE_PROVIDER_SITE_OTHER): Payer: BC Managed Care – PPO | Admitting: Family Medicine

## 2014-04-26 VITALS — BP 150/90 | HR 76 | Resp 18 | Ht 65.5 in | Wt 201.0 lb

## 2014-04-26 DIAGNOSIS — Z23 Encounter for immunization: Secondary | ICD-10-CM

## 2014-04-26 DIAGNOSIS — R7303 Prediabetes: Secondary | ICD-10-CM

## 2014-04-26 DIAGNOSIS — G43101 Migraine with aura, not intractable, with status migrainosus: Secondary | ICD-10-CM

## 2014-04-26 DIAGNOSIS — J3089 Other allergic rhinitis: Secondary | ICD-10-CM

## 2014-04-26 DIAGNOSIS — I10 Essential (primary) hypertension: Secondary | ICD-10-CM

## 2014-04-26 DIAGNOSIS — R7309 Other abnormal glucose: Secondary | ICD-10-CM

## 2014-04-26 DIAGNOSIS — R7302 Impaired glucose tolerance (oral): Secondary | ICD-10-CM

## 2014-04-26 DIAGNOSIS — E559 Vitamin D deficiency, unspecified: Secondary | ICD-10-CM

## 2014-04-26 DIAGNOSIS — E669 Obesity, unspecified: Secondary | ICD-10-CM

## 2014-04-26 NOTE — Patient Instructions (Addendum)
F/u in 4.5 month, call if you need me beforwe  BP is high today, it is VERY important that you take your medication EVERY DAY AT THE SAME TIME  Happy that headaches are much improved, and that you have decided to eliminate stress which was affecting your health  PLEASE schedule your mammogram this is past due  Labs today, hBA1C, TSH, chem 7 and Vit D   Flu vaccine today  Please start 30 minutes every day of exercise for overall health   ALL THE VERY BEST IN JOB SEARCH

## 2014-04-27 ENCOUNTER — Other Ambulatory Visit: Payer: Self-pay | Admitting: Family Medicine

## 2014-04-27 DIAGNOSIS — Z1231 Encounter for screening mammogram for malignant neoplasm of breast: Secondary | ICD-10-CM

## 2014-04-27 LAB — BASIC METABOLIC PANEL
BUN: 12 mg/dL (ref 6–23)
CO2: 21 mEq/L (ref 19–32)
CREATININE: 0.89 mg/dL (ref 0.50–1.10)
Calcium: 9.6 mg/dL (ref 8.4–10.5)
Chloride: 108 mEq/L (ref 96–112)
GLUCOSE: 74 mg/dL (ref 70–99)
Potassium: 4 mEq/L (ref 3.5–5.3)
Sodium: 141 mEq/L (ref 135–145)

## 2014-04-27 LAB — TSH: TSH: 0.653 u[IU]/mL (ref 0.350–4.500)

## 2014-04-27 LAB — HEMOGLOBIN A1C
Hgb A1c MFr Bld: 6.1 % — ABNORMAL HIGH (ref <5.7)
Mean Plasma Glucose: 128 mg/dL — ABNORMAL HIGH (ref <117)

## 2014-04-27 LAB — VITAMIN D 25 HYDROXY (VIT D DEFICIENCY, FRACTURES): VIT D 25 HYDROXY: 26 ng/mL — AB (ref 30–89)

## 2014-05-01 DIAGNOSIS — R7303 Prediabetes: Secondary | ICD-10-CM | POA: Insufficient documentation

## 2014-05-01 DIAGNOSIS — Z23 Encounter for immunization: Secondary | ICD-10-CM | POA: Insufficient documentation

## 2014-05-01 NOTE — Assessment & Plan Note (Signed)
Deteriorated Patient educated about the importance of limiting  Carbohydrate intake , the need to commit to daily physical activity for a minimum of 30 minutes , and to commit weight loss. The fact that changes in all these areas will reduce or eliminate all together the development of diabetes is stressed.   \Updated lab needed at/ before next visit.  

## 2014-05-01 NOTE — Assessment & Plan Note (Signed)
Vaccine administered at visit.  

## 2014-05-01 NOTE — Assessment & Plan Note (Signed)
Deteriorated. Patient re-educated about  the importance of commitment to a  minimum of 150 minutes of exercise per week. The importance of healthy food choices with portion control discussed. Encouraged to start a food diary, count calories and to consider  joining a support group. Sample diet sheets offered. Goals set by the patient for the next several months.    

## 2014-05-01 NOTE — Assessment & Plan Note (Signed)
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

## 2014-05-01 NOTE — Assessment & Plan Note (Signed)
Uncontrolled, non compliant with meds on a regular schedule, importance of sam is stressed DASH diet and commitment to daily physical activity for a minimum of 30 minutes discussed and encouraged, as a part of hypertension management. The importance of attaining a healthy weight is also discussed.

## 2014-05-01 NOTE — Assessment & Plan Note (Signed)
Controlled, no change in medication  

## 2014-05-01 NOTE — Progress Notes (Signed)
Subjective:    Patient ID: Latoya Decker, female    DOB: [birth year], 53 y.o.   MRN: [id number]  HPI The PT is here for follow up and re-evaluation of chronic medical conditions, medication management and review of any available recent lab and radiology data.  Preventive health is updated, specifically  Cancer screening and Immunization.   Questions or concerns regarding consultations or procedures which the PT has had in the interim are  Addressed.Is pleased with headache management, now controlled, being treated through the headache clinic The PT denies any adverse reactions to current medications since the last visit.  There are no new concerns. Much improvement since deciding to and actually leaving her job of over 15 years, emotionally stressful, she was drained There are no specific complaints       Review of Systems See HPI Denies recent fever or chills. Denies sinus pressure, nasal congestion, ear pain or sore throat. Denies chest congestion, productive cough or wheezing. Denies chest pains, palpitations and leg swelling Denies abdominal pain, nausea, vomiting,diarrhea or constipation.   Denies dysuria, frequency, hesitancy or incontinence. Denies joint pain, swelling and limitation in mobility. Denies uncontrolled  headaches, denies seizures, numbness, or tingling. Denies depression, reports improvement in  anxiety and  Insomnia.since she has left her job Denies skin break down or rash.        Objective:   Physical Exam BP 150/90  Pulse 76  Resp 18  Ht 5' 5.5" (1.664 m)  Wt 201 lb (91.173 kg)  BMI 32.93 kg/m2  SpO2 98% Patient alert and oriented and in no cardiopulmonary distress.  HEENT: No facial asymmetry, EOMI,   oropharynx pink and moist.  Neck supple no JVD, no mass.  Chest: Clear to auscultation bilaterally.  CVS: S1, S2 no murmurs, no S3.Regular rate.  ABD: Soft non tender.   Ext: No edema  MS: Adequate ROM spine, shoulders, hips and  knees.  Skin: Intact, no ulcerations or rash noted.  Psych: Good eye contact, normal affect. Memory intact not anxious or depressed appearing.  CNS: CN 2-12 intact, power,  normal throughout.no focal deficits noted.        Assessment & Plan:  HTN (hypertension) Uncontrolled, non compliant with meds on a regular schedule, importance of sam is stressed DASH diet and commitment to daily physical activity for a minimum of 30 minutes discussed and encouraged, as a part of hypertension management. The importance of attaining a healthy weight is also discussed.   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 Patient re-educated about  the importance of commitment to a  minimum of 150 minutes of exercise per week. The importance of healthy food choices with portion control discussed. Encouraged to start a food diary, count calories and to consider  joining a support group. Sample diet sheets offered. Goals set by the patient for the next several months.     Prediabetes Deteriorated Patient educated about the importance of limiting  Carbohydrate intake , the need to commit to daily physical activity for a minimum of 30 minutes , and to commit weight loss. The fact that changes in all these areas will reduce or eliminate all together the development of diabetes is stressed.   Updated lab needed at/ before next visit.   Allergic rhinitis  Controlled, no change in medication   Need for prophylactic vaccination and inoculation against influenza Vaccine administered at visit.

## 2014-05-05 ENCOUNTER — Ambulatory Visit (HOSPITAL_COMMUNITY)
Admission: RE | Admit: 2014-05-05 | Discharge: 2014-05-05 | Disposition: A | Discharge status: Home / Self Care | Payer: BC Managed Care – PPO | Source: Ambulatory Visit | Attending: Family Medicine | Admitting: Family Medicine

## 2014-05-05 DIAGNOSIS — Z1231 Encounter for screening mammogram for malignant neoplasm of breast: Secondary | ICD-10-CM | POA: Diagnosis present

## 2014-05-10 NOTE — Assessment & Plan Note (Signed)
New disabling headache accompanied by loss of vision and speech, awakened pt, needs neurology eval and work excuse Exam in office showsd no neurologic deficits

## 2014-05-10 NOTE — Assessment & Plan Note (Signed)
Carotid doppler ordered.  

## 2014-05-10 NOTE — Assessment & Plan Note (Signed)
Sleep hygiene reviewed and written information offered also. Prescription sent for  medication needed.  

## 2014-05-10 NOTE — Assessment & Plan Note (Signed)
Controlled, no change in medication  

## 2014-06-12 ENCOUNTER — Other Ambulatory Visit: Payer: Self-pay | Admitting: Family Medicine

## 2014-09-28 ENCOUNTER — Encounter: Payer: Self-pay | Admitting: Family Medicine

## 2014-09-28 ENCOUNTER — Ambulatory Visit (INDEPENDENT_AMBULATORY_CARE_PROVIDER_SITE_OTHER): Payer: BLUE CROSS/BLUE SHIELD | Admitting: Family Medicine

## 2014-09-28 VITALS — BP 122/80 | HR 69 | Resp 16 | Ht 65.5 in | Wt 204.0 lb

## 2014-09-28 DIAGNOSIS — I10 Essential (primary) hypertension: Secondary | ICD-10-CM

## 2014-09-28 DIAGNOSIS — G4700 Insomnia, unspecified: Secondary | ICD-10-CM

## 2014-09-28 DIAGNOSIS — R7303 Prediabetes: Secondary | ICD-10-CM

## 2014-09-28 DIAGNOSIS — J3089 Other allergic rhinitis: Secondary | ICD-10-CM

## 2014-09-28 DIAGNOSIS — G43801 Other migraine, not intractable, with status migrainosus: Secondary | ICD-10-CM

## 2014-09-28 DIAGNOSIS — Z1159 Encounter for screening for other viral diseases: Secondary | ICD-10-CM

## 2014-09-28 DIAGNOSIS — E669 Obesity, unspecified: Secondary | ICD-10-CM

## 2014-09-28 DIAGNOSIS — R7309 Other abnormal glucose: Secondary | ICD-10-CM

## 2014-09-28 LAB — COMPLETE METABOLIC PANEL WITH GFR
ALBUMIN: 4.1 g/dL (ref 3.5–5.2)
ALT: 11 U/L (ref 0–35)
AST: 18 U/L (ref 0–37)
Alkaline Phosphatase: 151 U/L — ABNORMAL HIGH (ref 39–117)
BUN: 12 mg/dL (ref 6–23)
CALCIUM: 8.7 mg/dL (ref 8.4–10.5)
CHLORIDE: 108 meq/L (ref 96–112)
CO2: 23 meq/L (ref 19–32)
Creat: 0.93 mg/dL (ref 0.50–1.10)
GFR, EST AFRICAN AMERICAN: 81 mL/min
GFR, Est Non African American: 70 mL/min
Glucose, Bld: 98 mg/dL (ref 70–99)
POTASSIUM: 4 meq/L (ref 3.5–5.3)
Sodium: 141 mEq/L (ref 135–145)
Total Bilirubin: 0.5 mg/dL (ref 0.2–1.2)
Total Protein: 6.3 g/dL (ref 6.0–8.3)

## 2014-09-28 MED ORDER — METHYLPREDNISOLONE ACETATE 80 MG/ML IJ SUSP
80.0000 mg | Freq: Once | INTRAMUSCULAR | Status: AC
  Administered 2014-09-28: 80 mg via INTRAMUSCULAR

## 2014-09-28 MED ORDER — PREDNISONE 5 MG PO TABS: 5.0000 mg | ORAL_TABLET | Freq: Two times a day (BID) | ORAL | Status: AC

## 2014-09-28 MED ORDER — CETIRIZINE HCL 10 MG PO TBDP: ORAL_TABLET | ORAL | Status: DC

## 2014-09-28 NOTE — Patient Instructions (Signed)
F/U in 4.5 month, call if you need me before  For allergies, new is zyrtec one daily, short course of prednisone, and depo medrol in office today  You are referred for nutrtional education, and material is also provided  It is important that you exercise regularly at least 30 minutes 5 times a week. If you develop chest pain, have severe difficulty breathing, or feel very tired, stop exercising immediately and seek medical attention   BP is excellent and thankfulr you are much better  Thanks for choosing [facility], we consider it a privelige to serve you.   Labs today, HBA1C, chem 7 and EGFR, HIV

## 2014-09-28 NOTE — Assessment & Plan Note (Addendum)
Uncontrolled.  Depo medrol administered IM in the office, to be followed by a short course of oral prednisone. Steroid nasal spray added to daily zyrtec, changed for claritin due to symptom severity

## 2014-09-28 NOTE — Assessment & Plan Note (Signed)
Deteriorated. Patient re-educated about  the importance of commitment to a  minimum of 150 minutes of exercise per week. The importance of healthy food choices with portion control discussed. Encouraged to start a food diary, count calories and to consider  joining a support group. Sample diet sheets offered. Goals set by the patient for the next several months.    

## 2014-09-28 NOTE — Assessment & Plan Note (Signed)
Sleep hygiene reviewed and written information offered also. Prescription sent for  medication needed.  

## 2014-09-28 NOTE — Assessment & Plan Note (Addendum)
Controlled, no change in medication, managed by neurology

## 2014-09-28 NOTE — Progress Notes (Signed)
Subjective:    Patient ID: Latoya Decker, female    DOB: 1960-12-12, 54 y.o.   MRN: [id number]  HPI The PT is here for follow up and re-evaluation of chronic medical conditions, medication management and review of any available recent lab and radiology data.  Preventive health is updated, specifically  Cancer screening and Immunization.   Questions or concerns regarding consultations or procedures which the PT has had in the interim are  Addressed.Recently saw neurology, headaches are well controlled. 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  Wants help with nutrition ed to address her problems of obesity and prediabetes Has been experiencing increased generalized itching and a rash has developed on er face in the past 5 days, she denies any change in detergent or personal care products, feels a lot of this is a response to seasonal allergies     Review of Systems See HPI Denies recent fever or chills. Denies sinus pressure, nasal congestion, ear pain or sore throat. Denies chest congestion, productive cough or wheezing. Denies chest pains, palpitations and leg swelling Denies abdominal pain, nausea, vomiting,diarrhea or constipation.   Denies dysuria, frequency, hesitancy or incontinence. Denies joint pain, swelling and limitation in mobility. Denies headaches, seizures, numbness, or tingling. Denies depression, anxiety or insomnia.       Objective:   Physical Exam BP 122/80 mmHg  Pulse 69  Resp 16  Ht 5' 5.5" (1.664 m)  Wt 204 lb (92.534 kg)  BMI 33.42 kg/m2  SpO2 98% Patient alert and oriented and in no cardiopulmonary distress.  HEENT: No facial asymmetry, EOMI,   oropharynx pink and moist.  Neck supple no JVD, no mass.  Chest: Clear to auscultation bilaterally.  CVS: S1, S2 no murmurs, no S3.Regular rate.  ABD: Soft non tender.   Ext: No  edema  MS: Adequate ROM spine, shoulders, hips and knees.  Skin: erythematous rash on face esp above nasal bridge to forehead, no sign of bacterial superinfection.  Psych: Good eye contact, normal affect. Memory intact not anxious or depressed appearing.  CNS: CN 2-12 intact, power,  normal throughout.no focal deficits noted.        Assessment & Plan:  Headache, variant migraine, with status migrainosus Controlled, no change in medication, managed by neurology    HTN (hypertension) Controlled, no change in medication DASH diet and commitment to daily physical activity for a minimum of 30 minutes discussed and encouraged, as a part of hypertension management. The importance of attaining a healthy weight is also discussed.    Allergic rhinitis Uncontrolled.  Depo medrol administered IM in the office, to be followed by a short course of oral prednisone. Steroid nasal spray added to daily zyrtec, changed for claritin due to symptom severity     Prediabetes Updated lab needed at/ before next visit. Patient educated about the importance of limiting  Carbohydrate intake , the need to commit to daily physical activity for a minimum of 30 minutes , and to commit weight loss. The fact that changes in all these areas will reduce or eliminate all together the development of diabetes is stressed.   Refer for nutrition ed   Insomnia Sleep hygiene reviewed and written information offered also. Prescription sent for  medication needed.    Obesity (BMI 30.0-34.9) Deteriorated. Patient re-educated about  the importance of commitment to a  minimum of 150 minutes of exercise  per week. The importance of healthy food choices with portion control discussed. Encouraged to start a food diary, count calories and to consider  joining a support group. Sample diet sheets offered. Goals set by the patient for the next several months.

## 2014-09-28 NOTE — Assessment & Plan Note (Signed)
Controlled, no change in medication DASH diet and commitment to daily physical activity for a minimum of 30 minutes discussed and encouraged, as a part of hypertension management. The importance of attaining a healthy weight is also discussed.  

## 2014-09-28 NOTE — Assessment & Plan Note (Signed)
Updated lab needed at/ before next visit. Patient educated about the importance of limiting  Carbohydrate intake , the need to commit to daily physical activity for a minimum of 30 minutes , and to commit weight loss. The fact that changes in all these areas will reduce or eliminate all together the development of diabetes is stressed.   Refer for nutrition ed

## 2014-09-29 LAB — HIV ANTIBODY (ROUTINE TESTING W REFLEX): HIV 1&2 Ab, 4th Generation: NONREACTIVE

## 2014-09-29 LAB — HEMOGLOBIN A1C
Hgb A1c MFr Bld: 6 % — ABNORMAL HIGH (ref <5.7)
Mean Plasma Glucose: 126 mg/dL — ABNORMAL HIGH (ref <117)

## 2014-10-04 ENCOUNTER — Emergency Department (HOSPITAL_COMMUNITY)
Admission: EM | Admit: 2014-10-04 | Discharge: 2014-10-04 | Disposition: A | Discharge status: Home / Self Care | Payer: BLUE CROSS/BLUE SHIELD | Attending: Emergency Medicine | Admitting: Emergency Medicine

## 2014-10-04 ENCOUNTER — Encounter (HOSPITAL_COMMUNITY): Payer: Self-pay | Admitting: *Deleted

## 2014-10-04 DIAGNOSIS — J0100 Acute maxillary sinusitis, unspecified: Secondary | ICD-10-CM | POA: Diagnosis not present

## 2014-10-04 DIAGNOSIS — Z8711 Personal history of peptic ulcer disease: Secondary | ICD-10-CM | POA: Diagnosis not present

## 2014-10-04 DIAGNOSIS — Z8659 Personal history of other mental and behavioral disorders: Secondary | ICD-10-CM | POA: Diagnosis not present

## 2014-10-04 DIAGNOSIS — Z8619 Personal history of other infectious and parasitic diseases: Secondary | ICD-10-CM | POA: Diagnosis not present

## 2014-10-04 DIAGNOSIS — Z79899 Other long term (current) drug therapy: Secondary | ICD-10-CM | POA: Insufficient documentation

## 2014-10-04 DIAGNOSIS — I10 Essential (primary) hypertension: Secondary | ICD-10-CM | POA: Insufficient documentation

## 2014-10-04 DIAGNOSIS — R51 Headache: Secondary | ICD-10-CM | POA: Diagnosis present

## 2014-10-04 DIAGNOSIS — Z8719 Personal history of other diseases of the digestive system: Secondary | ICD-10-CM | POA: Diagnosis not present

## 2014-10-04 DIAGNOSIS — Z9889 Other specified postprocedural states: Secondary | ICD-10-CM | POA: Diagnosis not present

## 2014-10-04 MED ORDER — FLUTICASONE PROPIONATE 50 MCG/ACT NA SUSP: 2.0000 | Freq: Every day | NASAL | Status: DC

## 2014-10-04 MED ORDER — IBUPROFEN 800 MG PO TABS: 800.0000 mg | ORAL_TABLET | Freq: Three times a day (TID) | ORAL | Status: DC | PRN

## 2014-10-04 NOTE — ED Provider Notes (Signed)
TIME SEEN: 3:05 PM   This chart was scribed for [place], DO by Zola Button, ED Scribe. This patient was seen in room APA19/APA19 and the patient's care was started at 3:05 PM.    CHIEF COMPLAINT: Recurrent Sinusitis  HPI:  HPI Comments: Latoya Decker is a 54 y.o. female who presents to the Emergency Department complaining of gradual onset sinusitis symptoms that started over a week ago but worsened yesterday. Patient reports having headache, sinus congestion, and sinus pressure. She saw her PCP last week and was put on Zyrtec and prednisone; she was also advised to get a nasal spray, which she has not done yet. Patient denies fever. She also denies sick contacts and recent travel.  Has had dry cough. No vomiting or diarrhea. Has not taken anything for her headache.  ROS: See HPI Constitutional: no fever  Eyes: no drainage  ENT: congestion   Cardiovascular:  no chest pain  Resp: no SOB  GI: no vomiting GU: no dysuria Integumentary: no rash  Allergy: no hives  Musculoskeletal: no leg swelling  Neurological: no slurred speech ROS otherwise negative  PAST MEDICAL HISTORY/PAST SURGICAL HISTORY:  Past Medical History  Diagnosis Date  . Anxiety   . Depression   . Headache(784.0)     migraines  . Duodenal ulcer 01/05/2010    EGD Dr Dierdre Searles, h pylori gastritis, completed treatment  . Duodenitis   . Gastritis     hospitalized 6/23-6/25 2011   . PUD (peptic ulcer disease)     Dr Tamala Julian  . S/P colonoscopy 12/27/2003    Dr Voncille Lo  . Family hx of colon cancer     Mother & sister  . Helicobacter pylori gastritis   . S/P endoscopy 12/27/2003    Dr [place] Service superficial ulcers GEJ, duodenitis,gastritis  . Hypertension   . Eosinophilic gastroenteritis JAN 2015    [place] 2014: NL CMP/GIARDIA Ag    MEDICATIONS:  Prior to Admission medications   Medication Sig Start Date End Date Taking? Authorizing Provider  amLODipine (NORVASC) 2.5 MG tablet TAKE 1 TABLET BY MOUTH DAILY  06/13/14   Fayrene Helper, MD  baclofen (LIORESAL) 10 MG tablet Take 10 mg by mouth. Taking 2 tablets daily    Historical Provider, MD  Cetirizine HCl 10 MG TBDP One tablet once daily 09/28/14   Fayrene Helper, MD  meclizine (ANTIVERT) 25 MG tablet Take 1 tablet (25 mg total) by mouth 4 (four) times daily. Patient not taking: Reported on 09/28/2014 01/10/14   Fredia Sorrow, MD  olopatadine (PATANOL) 0.1 % ophthalmic solution Place 1 drop into both eyes 2 (two) times daily. Patient not taking: Reported on 09/28/2014 11/24/13   Fayrene Helper, MD  temazepam (RESTORIL) 30 MG capsule TAKE 1 CAPSULE BY MOUTH EVERY NIGHT AT BEDTIME AS NEEDED FOR SLEEP 06/13/14   Fayrene Helper, MD  topiramate (TOPAMAX) 100 MG tablet Take 100 mg by mouth daily.    Historical Provider, MD    ALLERGIES:  Allergies  Allergen Reactions  . Codeine Nausea And Vomiting  . Hydrocodone Nausea And Vomiting  . Lidocaine Viscous     Couldn't think straight    SOCIAL HISTORY:  History  Substance Use Topics  . Smoking status: Never Smoker   . Smokeless tobacco: Never Used     Comment: Never smoker  . Alcohol Use: No    FAMILY HISTORY: Family History  Problem Relation Age of Onset  . Colon cancer Mother 29  . Diabetes Mother   .  Hypertension Mother   . Stroke Mother   . Heart attack Father   . Diabetes Father   . Hypertension Father   . Colon cancer Sister 63  . Lupus Brother   . Parkinsonism Brother   . Anesthesia problems Neg Hx   . Hypotension Neg Hx   . Malignant hyperthermia Neg Hx   . Pseudochol deficiency Neg Hx     EXAM: BP 157/85 mmHg  Pulse 83  Temp(Src) 98.5 F (36.9 C) (Oral)  Resp 18  Ht 5\' 6"  (1.676 m)  Wt 180 lb (81.647 kg)  BMI 29.07 kg/m2  SpO2 100% CONSTITUTIONAL: Alert and oriented and responds appropriately to questions. Well-appearing; well-nourished HEAD: Normocephalic EYES: Conjunctivae clear, PERRL ENT: normal nose; clear nasal sinus drainage; moist mucous  membranes; pharynx without lesions noted; tenderness over bilateral maxillary sinuses with no erythema or warmth NECK: Supple, no meningismus, no LAD  CARD: RRR; S1 and S2 appreciated; no murmurs, no clicks, no rubs, no gallops RESP: Normal chest excursion without splinting or tachypnea; breath sounds clear and equal bilaterally; no wheezes, no rhonchi, no rales,  ABD/GI: Normal bowel sounds; non-distended; soft, non-tender, no rebound, no guarding BACK:  The back appears normal and is non-tender to palpation, there is no CVA tenderness EXT: Normal ROM in all joints; non-tender to palpation; no edema; normal capillary refill; no cyanosis    SKIN: Normal color for age and race; warm NEURO: Moves all extremities equally, normal gait, cranial nerves II through XII intact, sensation to light touch intact diffusely PSYCH: The patient's mood and manner are appropriate. Grooming and personal hygiene are appropriate.  MEDICAL DECISION MAKING: Patient here with sinusitis likely from allergies versus viral illness. I do not feel she needs antibiotics at this time. She is well-appearing, nontoxic and hemodynamically stable. She is already on prednisone as well as Zyrtec.  Will prescribe Flonase nasal spray and have her use over-the-counter nasal saline and Afrin nasal spray for congestion. Discussed with patient not taking Afrin more than 3 days in a row. We'll also give her prescription for ibuprofen. Discussed return precautions. Discussed with patient I do not think abx would be helpful. She has a primary care physician for follow-up. She verbalized understanding and is comfortable with plan. We'll provide work note. physician for follow-up.     I personally performed the services described in this documentation, which was scribed in my presence. The recorded information has been reviewed and is accurate.   [place], DO 10/04/14 1601

## 2014-10-04 NOTE — ED Notes (Signed)
Headache, sinus congestion , Has been seen by her MD for this and taking meds

## 2014-10-04 NOTE — Discharge Instructions (Signed)
[place]as continue using your prednisone and Zyrtec as prescribed by your primary care provider. You may use over-the-counter nasal saline spray multiple times a day to keep your sinuses from being dry and help with congestion. You may also use Afrin nasal spray several times a day but please do not use this for more than 3 days and Arava as it may worsen your nasal congestion. Your symptoms are likely caused by allergies or a virus. Antibiotics will not help with your symptoms.   Sinusitis Sinusitis is redness, soreness, and inflammation of the paranasal sinuses. Paranasal sinuses are air pockets within the bones of your face (beneath the eyes, the middle of the forehead, or above the eyes). In healthy paranasal sinuses, mucus is able to drain out, and air is able to circulate through them by way of your nose. However, when your paranasal sinuses are inflamed, mucus and air can become trapped. This can allow bacteria and other germs to grow and cause infection. Sinusitis can develop quickly and last only a short time (acute) or continue over a long period (chronic). Sinusitis that lasts for more than 12 weeks is considered chronic.  CAUSES  Causes of sinusitis include:  Allergies.  Structural abnormalities, such as displacement of the cartilage that separates your nostrils (deviated septum), which can decrease the air flow through your nose and sinuses and affect sinus drainage.  Functional abnormalities, such as when the small hairs (cilia) that line your sinuses and help remove mucus do not work properly or are not present. SIGNS AND SYMPTOMS  Symptoms of acute and chronic sinusitis are the same. The primary symptoms are pain and pressure around the affected sinuses. Other symptoms include:  Upper toothache.  Earache.  Headache.  Bad breath.  Decreased sense of smell and taste.  A cough, which worsens when you are lying flat.  Fatigue.  Fever.  Thick drainage from your nose, which  often is green and may contain pus (purulent).  Swelling and warmth over the affected sinuses. DIAGNOSIS  Your health care provider will perform a physical exam. During the exam, your health care provider may:  Look in your nose for signs of abnormal growths in your nostrils (nasal polyps).  Tap over the affected sinus to check for signs of infection.  View the inside of your sinuses (endoscopy) using an imaging device that has a light attached (endoscope). If your health care provider suspects that you have chronic sinusitis, one or more of the following tests may be recommended:  Allergy tests.  Nasal culture. A sample of mucus is taken from your nose, sent to a lab, and screened for bacteria.  Nasal cytology. A sample of mucus is taken from your nose and examined by your health care provider to determine if your sinusitis is related to an allergy. TREATMENT  Most cases of acute sinusitis are related to a viral infection and will resolve on their own within 10 days. Sometimes medicines are prescribed to help relieve symptoms (pain medicine, decongestants, nasal steroid sprays, or saline sprays).  However, for sinusitis related to a bacterial infection, your health care provider will prescribe antibiotic medicines. These are medicines that will help kill the bacteria causing the infection.  Rarely, sinusitis is caused by a fungal infection. In theses cases, your health care provider will prescribe antifungal medicine. For some cases of chronic sinusitis, surgery is needed. Generally, these are cases in which sinusitis recurs more than 3 times per year, despite other treatments. HOME CARE INSTRUCTIONS  Drink plenty of water. Water helps thin the mucus so your sinuses can drain more easily.  Use a humidifier.  Inhale steam 3 to 4 times a day (for example, sit in the bathroom with the shower running).  Apply a warm, moist washcloth to your face 3 to 4 times a day, or as directed by your  health care provider.  Use saline nasal sprays to help moisten and clean your sinuses.  Take medicines only as directed by your health care provider.  If you were prescribed either an antibiotic or antifungal medicine, finish it all even if you start to feel better. SEEK IMMEDIATE MEDICAL CARE IF:  You have increasing pain or severe headaches.  You have nausea, vomiting, or drowsiness.  You have swelling around your face.  You have vision problems.  You have a stiff neck.  You have difficulty breathing. MAKE SURE YOU:   Understand these instructions.  Will watch your condition.  Will get help right away if you are not doing well or get worse. Document Released: 07/01/2005 Document Revised: 11/15/2013 Document Reviewed: 07/16/2011 [facility] Patient Information 2015 [place], [place]. This information is not intended to replace advice given to you by your health care provider. Make sure you discuss any questions you have with your health care provider.

## 2014-11-27 ENCOUNTER — Other Ambulatory Visit: Payer: Self-pay | Admitting: Family Medicine

## 2015-02-01 ENCOUNTER — Encounter: Payer: Self-pay | Admitting: *Deleted

## 2015-02-01 ENCOUNTER — Ambulatory Visit: Payer: BLUE CROSS/BLUE SHIELD | Admitting: Family Medicine

## 2015-02-03 ENCOUNTER — Other Ambulatory Visit: Payer: Self-pay

## 2015-02-03 MED ORDER — AMLODIPINE BESYLATE 2.5 MG PO TABS: 2.5000 mg | ORAL_TABLET | Freq: Every day | ORAL | Status: DC

## 2015-02-22 ENCOUNTER — Ambulatory Visit: Payer: BLUE CROSS/BLUE SHIELD | Admitting: Family Medicine

## 2015-02-23 ENCOUNTER — Ambulatory Visit (INDEPENDENT_AMBULATORY_CARE_PROVIDER_SITE_OTHER): Payer: BLUE CROSS/BLUE SHIELD | Admitting: Family Medicine

## 2015-02-23 ENCOUNTER — Encounter: Payer: Self-pay | Admitting: Family Medicine

## 2015-02-23 VITALS — BP 126/78 | HR 86 | Resp 18 | Ht 65.5 in | Wt 204.0 lb

## 2015-02-23 DIAGNOSIS — Z1159 Encounter for screening for other viral diseases: Secondary | ICD-10-CM | POA: Diagnosis not present

## 2015-02-23 DIAGNOSIS — K269 Duodenal ulcer, unspecified as acute or chronic, without hemorrhage or perforation: Secondary | ICD-10-CM

## 2015-02-23 DIAGNOSIS — E669 Obesity, unspecified: Secondary | ICD-10-CM

## 2015-02-23 DIAGNOSIS — R7309 Other abnormal glucose: Secondary | ICD-10-CM

## 2015-02-23 DIAGNOSIS — J3089 Other allergic rhinitis: Secondary | ICD-10-CM

## 2015-02-23 DIAGNOSIS — G43801 Other migraine, not intractable, with status migrainosus: Secondary | ICD-10-CM

## 2015-02-23 DIAGNOSIS — I10 Essential (primary) hypertension: Secondary | ICD-10-CM | POA: Diagnosis not present

## 2015-02-23 DIAGNOSIS — R7303 Prediabetes: Secondary | ICD-10-CM

## 2015-02-23 DIAGNOSIS — E559 Vitamin D deficiency, unspecified: Secondary | ICD-10-CM

## 2015-02-23 DIAGNOSIS — Z13220 Encounter for screening for lipoid disorders: Secondary | ICD-10-CM

## 2015-02-23 DIAGNOSIS — Z1211 Encounter for screening for malignant neoplasm of colon: Secondary | ICD-10-CM

## 2015-02-23 DIAGNOSIS — G4700 Insomnia, unspecified: Secondary | ICD-10-CM

## 2015-02-23 DIAGNOSIS — E66811 Obesity, class 1: Secondary | ICD-10-CM

## 2015-02-23 LAB — POC HEMOCCULT BLD/STL (OFFICE/1-CARD/DIAGNOSTIC): FECAL OCCULT BLD: NEGATIVE

## 2015-02-23 NOTE — Patient Instructions (Addendum)
  F/u in January, call if you need me before  Labs for January visit will be sent once I have labs from this visit available  Rectal exam today is normal, no sign of colon cancer  Please work on good  health habits so that your health will improve. 1. Commitment to daily physical activity for 30 to 60  minutes, if you are able to do this.  2. Commitment to wise food choices. Aim for half of your  food intake to be vegetable and fruit, one quarter starchy foods, and one quarter protein. Try to eat on a regular schedule  3 meals per day, snacking between meals should be limited to vegetables or fruits or small portions of nuts. 64 ounces of water per day is generally recommended, unless you have specific health conditions, like heart failure or kidney failure where you will need to limit fluid intake.  3. Commitment to sufficient and a  good quality of physical and mental rest daily, generally between 6 to 8 hours per day.  WITH PERSISTANCE AND PERSEVERANCE, THE IMPOSSIBLE , BECOMES THE NORM!   Thanks for choosing [facility], we consider it a privelige to serve you.

## 2015-02-24 ENCOUNTER — Other Ambulatory Visit: Payer: Self-pay

## 2015-02-24 MED ORDER — MECLIZINE HCL 25 MG PO TABS: 25.0000 mg | ORAL_TABLET | Freq: Two times a day (BID) | ORAL | Status: DC | PRN

## 2015-02-27 ENCOUNTER — Telehealth: Payer: Self-pay | Admitting: Family Medicine

## 2015-02-27 DIAGNOSIS — Z0000 Encounter for general adult medical examination without abnormal findings: Secondary | ICD-10-CM | POA: Insufficient documentation

## 2015-02-27 NOTE — Assessment & Plan Note (Signed)
Controlled, no change in medication DASH diet and commitment to daily physical activity for a minimum of 30 minutes discussed and encouraged, as a part of hypertension management. The importance of attaining a healthy weight is also discussed.  BP/Weight 02/23/2015 10/04/2014 09/28/2014 04/26/2014 02/02/2014 01/11/2014 5/42/7062  Systolic BP 376 283 151 761 607 371 062  Diastolic BP 78 75 80 90 79 72 64  Wt. (Lbs) 204 180 204 201 189 191.04 180  BMI 33.42 29.07 33.42 32.93 30.96 31.3 28.19

## 2015-02-27 NOTE — Progress Notes (Signed)
Latoya Decker     MRN: [id number]      DOB: [birth year]   HPI Ms. Name is here for follow up and re-evaluation of chronic medical conditions, medication management and review of any available recent lab and radiology data.  Preventive health is updated, specifically  Cancer screening and Immunization.   Questions or concerns regarding consultations or procedures which the PT has had in the interim are  addressed. The PT denies any adverse reactions to current medications since the last visit.  There are no new concerns.  There are no specific complaints   ROS Denies recent fever or chills. Denies sinus pressure, nasal congestion, ear pain or sore throat. Denies chest congestion, productive cough or wheezing. Denies chest pains, palpitations and leg swelling Denies abdominal pain, nausea, vomiting,diarrhea or constipation.   Denies dysuria, frequency, hesitancy or incontinence. Denies joint pain, swelling and limitation in mobility. Denies headaches, seizures, numbness, or tingling. Denies depression, anxiety or insomnia. Denies skin break down or rash.   PE  BP 126/78 mmHg  Pulse 86  Resp 18  Ht 5' 5.5" (1.664 m)  Wt 204 lb (92.534 kg)  BMI 33.42 kg/m2  SpO2 100%  Patient alert and oriented and in no cardiopulmonary distress.  HEENT: No facial asymmetry, EOMI,   oropharynx pink and moist.  Neck supple no JVD, no mass.  Chest: Clear to auscultation bilaterally.  CVS: S1, S2 no murmurs, no S3.Regular rate.  ABD: Soft non tender.no organomegaly  Or mass, normal bS Rectal : no mass , heme negative stoiol  Ext: No edema  MS: Adequate ROM spine, shoulders, hips and knees.  Skin: Intact, no ulcerations or rash noted.  Psych: Good eye contact, normal affect. Memory intact not anxious or depressed appearing.  CNS: CN 2-12 intact, power,  normal throughout.no focal deficits noted.   Assessment & Plan   HTN (hypertension) Controlled, no change in  medication DASH diet and commitment to daily physical activity for a minimum of 30 minutes discussed and encouraged, as a part of hypertension management. The importance of attaining a healthy weight is also discussed.  BP/Weight 02/23/2015 10/04/2014 09/28/2014 04/26/2014 02/02/2014 01/11/2014 7/82/9562  Systolic BP 130 865 784 696 295 284 132  Diastolic BP 78 75 80 90 79 72 64  Wt. (Lbs) 204 180 204 201 189 191.04 180  BMI 33.42 29.07 33.42 32.93 30.96 31.3 28.19        Headache, variant migraine, with status migrainosus Well controlled , improved on current regime and with less personal stress , job related, no change except pt not to take ibuprofen, will use tylenol instead has h/o bleeding intestinal ulcer  Allergic rhinitis Controlled, no change in medication   Duodenal ulcer without hemorrhage or perforation and without obstruction Healed, no recurrent pain or bleeding  Prediabetes Patient educated about the importance of limiting  Carbohydrate intake , the need to commit to daily physical activity for a minimum of 30 minutes , and to commit weight loss. The fact that changes in all these areas will reduce or eliminate all together the development of diabetes is stressed.   Diabetic Labs Latest Ref Rng 09/28/2014 04/26/2014 01/10/2014 01/10/2014 11/30/2013  HbA1c <5.7 % 6.0(H) 6.1(H) - - 5.8(H)  Chol 0 - 200 mg/dL - - - - 145  HDL >39 mg/dL - - - - 50  Calc LDL 0 - 99 mg/dL - - - - 77  Triglycerides <150 mg/dL - - - - 90  Creatinine 0.50 -  1.10 mg/dL 0.93 0.89 0.90 0.92 0.90   BP/Weight 02/23/2015 10/04/2014 09/28/2014 04/26/2014 02/02/2014 01/11/2014 6/76/7209  Systolic BP 470 962 836 629 476 546 503  Diastolic BP 78 75 80 90 79 72 64  Wt. (Lbs) 204 180 204 201 189 191.04 180  BMI 33.42 29.07 33.42 32.93 30.96 31.3 28.19  Updated lab needed at/ before next visit.  No flowsheet data found.     Insomnia Marked improvement , no need for ongoing medication  Obesity (BMI  30.0-34.9) Deteriorated. Patient re-educated about  the importance of commitment to a  minimum of 150 minutes of exercise per week.  The importance of healthy food choices with portion control discussed. Encouraged to start a food diary, count calories and to consider  joining a support group. Sample diet sheets offered. Goals set by the patient for the next several months.   Weight /BMI 02/23/2015 10/04/2014 09/28/2014  WEIGHT 204 lb 180 lb 204 lb  HEIGHT 5' 5.5" 5\' 6"  5' 5.5"  BMI 33.42 kg/m2 29.07 kg/m2 33.42 kg/m2    Current exercise per week 90 minutes.   Special screening for malignant neoplasms, colon Heme negative stool, no rectal/anal mass palpated

## 2015-02-27 NOTE — Assessment & Plan Note (Signed)
Well controlled , improved on current regime and with less personal stress , job related, no change except pt not to take ibuprofen, will use tylenol instead has h/o bleeding intestinal ulcer

## 2015-02-27 NOTE — Assessment & Plan Note (Signed)
Marked improvement , no need for ongoing medication

## 2015-02-27 NOTE — Telephone Encounter (Signed)
Noted.  Discharge mailed to patient along with followup appt

## 2015-02-27 NOTE — Assessment & Plan Note (Signed)
Patient educated about the importance of limiting  Carbohydrate intake , the need to commit to daily physical activity for a minimum of 30 minutes , and to commit weight loss. The fact that changes in all these areas will reduce or eliminate all together the development of diabetes is stressed.   Diabetic Labs Latest Ref Rng 09/28/2014 04/26/2014 01/10/2014 01/10/2014 11/30/2013  HbA1c <5.7 % 6.0(H) 6.1(H) - - 5.8(H)  Chol 0 - 200 mg/dL - - - - 145  HDL >39 mg/dL - - - - 50  Calc LDL 0 - 99 mg/dL - - - - 77  Triglycerides <150 mg/dL - - - - 90  Creatinine 0.50 - 1.10 mg/dL 0.93 0.89 0.90 0.92 0.90   BP/Weight 02/23/2015 10/04/2014 09/28/2014 04/26/2014 02/02/2014 01/11/2014 3/49/1791  Systolic BP 505 697 948 016 553 748 270  Diastolic BP 78 75 80 90 79 72 64  Wt. (Lbs) 204 180 204 201 189 191.04 180  BMI 33.42 29.07 33.42 32.93 30.96 31.3 28.19  Updated lab needed at/ before next visit.  No flowsheet data found.

## 2015-02-27 NOTE — Telephone Encounter (Signed)
Pls print and mail d/c instructions to hr, seems as tho none were provided at visit Also need 2nd week in January f/u appt scheduled pls do and let her know , thanks

## 2015-02-27 NOTE — Assessment & Plan Note (Signed)
Heme negative stool, no rectal/anal mass palpated

## 2015-02-27 NOTE — Assessment & Plan Note (Signed)
Controlled, no change in medication  

## 2015-02-27 NOTE — Assessment & Plan Note (Signed)
Healed, no recurrent pain or bleeding

## 2015-02-27 NOTE — Assessment & Plan Note (Signed)
Deteriorated. Patient re-educated about  the importance of commitment to a  minimum of 150 minutes of exercise per week.  The importance of healthy food choices with portion control discussed. Encouraged to start a food diary, count calories and to consider  joining a support group. Sample diet sheets offered. Goals set by the patient for the next several months.   Weight /BMI 02/23/2015 10/04/2014 09/28/2014  WEIGHT 204 lb 180 lb 204 lb  HEIGHT 5' 5.5" 5\' 6"  5' 5.5"  BMI 33.42 kg/m2 29.07 kg/m2 33.42 kg/m2    Current exercise per week 90 minutes.

## 2015-03-17 LAB — CBC WITH DIFFERENTIAL/PLATELET
BASOS ABS: 0 10*3/uL (ref 0.0–0.1)
Basophils Relative: 0 % (ref 0–1)
Eosinophils Absolute: 0.2 10*3/uL (ref 0.0–0.7)
Eosinophils Relative: 2 % (ref 0–5)
HCT: 42.4 % (ref 36.0–46.0)
HEMOGLOBIN: 13.9 g/dL (ref 12.0–15.0)
Lymphocytes Relative: 34 % (ref 12–46)
Lymphs Abs: 2.6 10*3/uL (ref 0.7–4.0)
MCH: 28.5 pg (ref 26.0–34.0)
MCHC: 32.8 g/dL (ref 30.0–36.0)
MCV: 87.1 fL (ref 78.0–100.0)
MPV: 9.6 fL (ref 8.6–12.4)
Monocytes Absolute: 0.5 10*3/uL (ref 0.1–1.0)
Monocytes Relative: 6 % (ref 3–12)
NEUTROS ABS: 4.5 10*3/uL (ref 1.7–7.7)
NEUTROS PCT: 58 % (ref 43–77)
PLATELETS: 290 10*3/uL (ref 150–400)
RBC: 4.87 MIL/uL (ref 3.87–5.11)
RDW: 13.4 % (ref 11.5–15.5)
WBC: 7.7 10*3/uL (ref 4.0–10.5)

## 2015-03-17 LAB — HEMOGLOBIN A1C
Hgb A1c MFr Bld: 6.1 % — ABNORMAL HIGH (ref <5.7)
Mean Plasma Glucose: 128 mg/dL — ABNORMAL HIGH (ref <117)

## 2015-03-18 LAB — TSH: TSH: 0.729 u[IU]/mL (ref 0.350–4.500)

## 2015-03-18 LAB — VITAMIN D 25 HYDROXY (VIT D DEFICIENCY, FRACTURES): Vit D, 25-Hydroxy: 25 ng/mL — ABNORMAL LOW (ref 30–100)

## 2015-03-18 LAB — LIPID PANEL
CHOLESTEROL: 137 mg/dL (ref 125–200)
HDL: 44 mg/dL — ABNORMAL LOW (ref >=46)
LDL Cholesterol: 71 mg/dL (ref <130)
TRIGLYCERIDES: 112 mg/dL (ref <150)
Total CHOL/HDL Ratio: 3.1 Ratio (ref <=5.0)
VLDL: 22 mg/dL (ref <30)

## 2015-03-18 LAB — HEPATITIS C ANTIBODY: HCV AB: NEGATIVE

## 2015-03-24 NOTE — Addendum Note (Signed)
Addended by: Eual Fines on: 03/24/2015 01:51 PM   Modules accepted: Orders

## 2015-05-31 ENCOUNTER — Telehealth: Payer: Self-pay | Admitting: Family Medicine

## 2015-05-31 NOTE — Telephone Encounter (Signed)
Patient is complaining of a headache since Sunday, Nausea started yesterday, [facility] yesterday and got a botox injection but this hasnt gotten any better, cant eat nor sleep, please advise?

## 2015-06-01 ENCOUNTER — Ambulatory Visit (INDEPENDENT_AMBULATORY_CARE_PROVIDER_SITE_OTHER): Payer: BLUE CROSS/BLUE SHIELD

## 2015-06-01 VITALS — BP 100/64

## 2015-06-01 DIAGNOSIS — G43801 Other migraine, not intractable, with status migrainosus: Secondary | ICD-10-CM

## 2015-06-01 MED ORDER — METHYLPREDNISOLONE ACETATE 80 MG/ML IJ SUSP
80.0000 mg | Freq: Once | INTRAMUSCULAR | Status: AC
  Administered 2015-06-01: 80 mg via INTRAMUSCULAR

## 2015-06-01 MED ORDER — PREDNISONE 5 MG PO TABS: 5.0000 mg | ORAL_TABLET | Freq: Two times a day (BID) | ORAL | Status: DC

## 2015-06-01 MED ORDER — KETOROLAC TROMETHAMINE 60 MG/2ML IM SOLN
60.0000 mg | Freq: Once | INTRAMUSCULAR | Status: AC
  Administered 2015-06-01: 60 mg via INTRAMUSCULAR

## 2015-06-01 MED ORDER — DIPHENOXYLATE-ATROPINE 2.5-0.025 MG PO TABS: 1.0000 | ORAL_TABLET | Freq: Four times a day (QID) | ORAL | Status: DC | PRN

## 2015-06-01 MED ORDER — ONDANSETRON HCL 4 MG PO TABS: 4.0000 mg | ORAL_TABLET | Freq: Every day | ORAL | Status: DC | PRN

## 2015-06-01 NOTE — Telephone Encounter (Signed)
Vomiting No.    Recommended treatment Hydration is important Fluids small frequent amounts as tolerated Good hygiene reduces transmission among family members Review Brat diet  Zofran 4 mg 1 tablet daily as needed for nausea and vomiting no more than 6 tablets   DiarrheaYes.    Recommended treatment  Imodium OTC  Can also offer Lomotil 1 tablet 4 times daily as needed no more than 10 tablets Good hygiene reduces transmission among family members Review Brat Diet  If patient starts to feel light headed or not passing much urine or becoming dehydrated will need to go to emergency room for IV hydration  Please call office if symptoms worsen or do not improve after 2-3 days     

## 2015-06-01 NOTE — Telephone Encounter (Signed)
Patient aware and will come in for injections.   Medications sent to pharmacy.

## 2015-06-01 NOTE — Patient Instructions (Signed)
Keep your next scheduled appointment.   There are 3 medications at the pharmacy for you:  Prednisone,  Generic Zofran, and Generic Lomotil   If you are not feeling better by next week seek care the the ED or urgent care.   Food Choices to Help Relieve Diarrhea, Adult When you have diarrhea, the foods you eat and your eating habits are very important. Choosing the right foods and drinks can help relieve diarrhea. Also, because diarrhea can last up to 7 days, you need to replace lost fluids and electrolytes (such as sodium, potassium, and chloride) in order to help prevent dehydration.  WHAT GENERAL GUIDELINES DO I NEED TO FOLLOW?  Slowly drink 1 cup (8 oz) of fluid for each episode of diarrhea. If you are getting enough fluid, your urine will be clear or pale yellow.  Eat starchy foods. Some good choices include white rice, white toast, pasta, low-fiber cereal, baked potatoes (without the skin), saltine crackers, and bagels.  Avoid large servings of any cooked vegetables.  Limit fruit to two servings per day. A serving is  cup or 1 small piece.  Choose foods with less than 2 g of fiber per serving.  Limit fats to less than 8 tsp (38 g) per day.  Avoid fried foods.  Eat foods that have probiotics in them. Probiotics can be found in certain dairy products.  Avoid foods and beverages that may increase the speed at which food moves through the stomach and intestines (gastrointestinal tract). Things to avoid include:  High-fiber foods, such as dried fruit, raw fruits and vegetables, nuts, seeds, and whole grain foods.  Spicy foods and high-fat foods.  Foods and beverages sweetened with high-fructose corn syrup, honey, or sugar alcohols such as xylitol, sorbitol, and mannitol. WHAT FOODS ARE RECOMMENDED? Grains White rice. White, [place], or pita breads (fresh or toasted), including plain rolls, buns, or bagels. White pasta. Saltine, soda, or graham crackers. Pretzels. Low-fiber cereal.  Cooked cereals made with water (such as cornmeal, farina, or cream cereals). Plain muffins. Matzo. Melba toast. Zwieback.  Vegetables Potatoes (without the skin). Strained tomato and vegetable juices. Most well-cooked and canned vegetables without seeds. Tender lettuce. Fruits Cooked or canned applesauce, apricots, cherries, fruit cocktail, grapefruit, peaches, pears, or plums. Fresh bananas, apples without skin, cherries, grapes, cantaloupe, grapefruit, peaches, oranges, or plums.  Meat and Other Protein Products Baked or boiled chicken. Eggs. Tofu. Fish. Seafood. Smooth peanut butter. Ground or well-cooked tender beef, ham, veal, lamb, pork, or poultry.  Dairy Plain yogurt, kefir, and unsweetened liquid yogurt. Lactose-free milk, buttermilk, or soy milk. Plain hard cheese. Beverages Sport drinks. Clear broths. Diluted fruit juices (except prune). Regular, caffeine-free sodas such as ginger ale. Water. Decaffeinated teas. Oral rehydration solutions. Sugar-free beverages not sweetened with sugar alcohols. Other Bouillon, broth, or soups made from recommended foods.  The items listed above may not be a complete list of recommended foods or beverages. Contact your dietitian for more options. WHAT FOODS ARE NOT RECOMMENDED? Grains Whole grain, whole wheat, bran, or rye breads, rolls, pastas, crackers, and cereals. Wild or brown rice. Cereals that contain more than 2 g of fiber per serving. Corn tortillas or taco shells. Cooked or dry oatmeal. Granola. Popcorn. Vegetables Raw vegetables. Cabbage, broccoli, Brussels sprouts, artichokes, baked beans, beet greens, corn, kale, legumes, peas, sweet potatoes, and yams. Potato skins. Cooked spinach and cabbage. Fruits Dried fruit, including raisins and dates. Raw fruits. Stewed or dried prunes. Fresh apples with skin, apricots, mangoes, pears, raspberries, and  strawberries.  Meat and Other Protein Products Chunky peanut butter. Nuts and seeds. Beans and  lentils. Berniece Salines.  Dairy High-fat cheeses. Milk, chocolate milk, and beverages made with milk, such as milk shakes. Cream. Ice cream. Sweets and Desserts Sweet rolls, doughnuts, and sweet breads. Pancakes and waffles. Fats and Oils Butter. Cream sauces. Margarine. Salad oils. Plain salad dressings. Olives. Avocados.  Beverages Caffeinated beverages (such as coffee, tea, soda, or energy drinks). Alcoholic beverages. Fruit juices with pulp. Prune juice. Soft drinks sweetened with high-fructose corn syrup or sugar alcohols. Other Coconut. Hot sauce. Chili powder. Mayonnaise. Gravy. Cream-based or milk-based soups.  The items listed above may not be a complete list of foods and beverages to avoid. Contact your dietitian for more information. WHAT SHOULD I DO IF I BECOME DEHYDRATED? Diarrhea can sometimes lead to dehydration. Signs of dehydration include dark urine and dry mouth and skin. If you think you are dehydrated, you should rehydrate with an oral rehydration solution. These solutions can be purchased at pharmacies, retail stores, or online.  Drink -1 cup (120-240 mL) of oral rehydration solution each time you have an episode of diarrhea. If drinking this amount makes your diarrhea worse, try drinking smaller amounts more often. For example, drink 1-3 tsp (5-15 mL) every 5-10 minutes.  A general rule for staying hydrated is to drink 1-2 L of fluid per day. Talk to your health care provider about the specific amount you should be drinking each day. Drink enough fluids to keep your urine clear or pale yellow.   This information is not intended to replace advice given to you by your health care provider. Make sure you discuss any questions you have with your health care provider.   Document Released: 09/21/2003 Document Revised: 07/22/2014 Document Reviewed: 05/24/2013 Elsevier Interactive Patient Education [employer].

## 2015-06-01 NOTE — Telephone Encounter (Signed)
pls offer nurse visit today for toradol 60 mg IM and depo medrol 80 mg IM, for headache as well as send in pred 5 mg twice daily for 5 days. GI protocol appropriate. If needs sick day for today and tomorrow OK,  Let her know UC or ED  If worsens next week pls

## 2015-06-01 NOTE — Progress Notes (Signed)
See telephone message for list of symptoms.   Patient in for injections for headache.  Given AVS with instructions.   Injections given as ordered with no voiced complaints.

## 2015-06-01 NOTE — Telephone Encounter (Signed)
Patient is complaining of multiple symptoms including body aches, cough, and headache.  Received injection at [facility] with no relief.  She is also complaining of GI symptoms with nausea and diarrhea.  No fever.    Advised patient to start daily allergy medication.  And will send in standing protocol for GI problems.   Do you agree with also prescribing prednisone?

## 2015-06-01 NOTE — Addendum Note (Signed)
Addended by: Denman George B on: 06/01/2015 10:23 AM   Modules accepted: Orders

## 2015-06-19 ENCOUNTER — Other Ambulatory Visit: Payer: Self-pay | Admitting: Family Medicine

## 2015-06-19 DIAGNOSIS — Z1231 Encounter for screening mammogram for malignant neoplasm of breast: Secondary | ICD-10-CM

## 2015-06-26 ENCOUNTER — Ambulatory Visit (HOSPITAL_COMMUNITY)
Admission: RE | Admit: 2015-06-26 | Discharge: 2015-06-26 | Disposition: A | Discharge status: Home / Self Care | Payer: BLUE CROSS/BLUE SHIELD | Source: Ambulatory Visit | Attending: Family Medicine | Admitting: Family Medicine

## 2015-06-26 DIAGNOSIS — Z1231 Encounter for screening mammogram for malignant neoplasm of breast: Secondary | ICD-10-CM | POA: Insufficient documentation

## 2015-06-28 ENCOUNTER — Other Ambulatory Visit: Payer: Self-pay | Admitting: Family Medicine

## 2015-06-28 DIAGNOSIS — R928 Other abnormal and inconclusive findings on diagnostic imaging of breast: Secondary | ICD-10-CM

## 2015-07-03 ENCOUNTER — Ambulatory Visit (HOSPITAL_COMMUNITY): Payer: BLUE CROSS/BLUE SHIELD

## 2015-07-19 ENCOUNTER — Encounter: Payer: Self-pay | Admitting: Family Medicine

## 2015-07-19 ENCOUNTER — Ambulatory Visit (INDEPENDENT_AMBULATORY_CARE_PROVIDER_SITE_OTHER): Payer: BLUE CROSS/BLUE SHIELD | Admitting: Family Medicine

## 2015-07-19 VITALS — BP 142/80 | HR 88 | Resp 18 | Ht 65.5 in | Wt 194.0 lb

## 2015-07-19 DIAGNOSIS — G43801 Other migraine, not intractable, with status migrainosus: Secondary | ICD-10-CM

## 2015-07-19 DIAGNOSIS — J3089 Other allergic rhinitis: Secondary | ICD-10-CM

## 2015-07-19 DIAGNOSIS — G4700 Insomnia, unspecified: Secondary | ICD-10-CM

## 2015-07-19 DIAGNOSIS — R7303 Prediabetes: Secondary | ICD-10-CM | POA: Diagnosis not present

## 2015-07-19 DIAGNOSIS — E669 Obesity, unspecified: Secondary | ICD-10-CM

## 2015-07-19 DIAGNOSIS — I10 Essential (primary) hypertension: Secondary | ICD-10-CM

## 2015-07-19 DIAGNOSIS — J029 Acute pharyngitis, unspecified: Secondary | ICD-10-CM

## 2015-07-19 DIAGNOSIS — M79602 Pain in left arm: Secondary | ICD-10-CM

## 2015-07-19 LAB — POCT RAPID STREP A (OFFICE): Rapid Strep A Screen: NEGATIVE

## 2015-07-19 MED ORDER — AMLODIPINE BESYLATE 2.5 MG PO TABS: 2.5000 mg | ORAL_TABLET | Freq: Every day | ORAL | Status: DC

## 2015-07-19 MED ORDER — TRAZODONE HCL 50 MG PO TABS: 50.0000 mg | ORAL_TABLET | Freq: Every evening | ORAL | Status: DC | PRN

## 2015-07-19 MED ORDER — FLUTICASONE PROPIONATE 50 MCG/ACT NA SUSP: 2.0000 | Freq: Every day | NASAL | Status: DC

## 2015-07-19 MED ORDER — TRAMADOL HCL 50 MG PO TABS: 50.0000 mg | ORAL_TABLET | Freq: Every day | ORAL | Status: DC | PRN

## 2015-07-19 NOTE — Patient Instructions (Signed)
F/u in 4.5 month, call if you need me sooner  Tramadol sent for left forearm pain, for limited duration, take  At bedtime as may make you drowsy  New to help with sleep is trazodone one at bedtime  I will be on the lookout for [place] of breast next week  CONGRATS on weight loss, 10 pounds in 4 months is great, keep this up  You do not have strep throat infection  Flonase spray sent for allergie Gargle with saline for throat , and small amouunt of honey soothes the throat also  Thanks for choosing [facility], we consider it a privelige to serve you. All the best for 2017!

## 2015-07-19 NOTE — Progress Notes (Signed)
Subjective:    Patient ID: Latoya Decker, female    DOB: 03/31/1961, 55 y.o.   MRN: [id number]  HPI   [name]     MRN: [id number]      DOB: [birth year]   HPI Latoya Decker is here for follow up and re-evaluation of chronic medical conditions, medication management and review of any available recent lab and radiology data.  Preventive health is updated, specifically  Cancer screening and Immunization.   Questions or concerns regarding consultations or procedures which the PT has had in the interim are  addressed. The PT denies any adverse reactions to current medications since the last visit.  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 Denies sinus pressure, nasal congestion, ear pain . Denies chest congestion, productive cough or wheezing. Denies chest pains, palpitations and leg swelling Denies abdominal pain, nausea, vomiting,diarrhea or constipation.   Denies dysuria, frequency, hesitancy or incontinence.  Denies uncontrolled  headaches, seizures, numbness, or tingling. Denies depression, anxiety does c/o  insomnia. Denies skin break down or rash.   PE  BP 142/80 mmHg  Pulse 88  Resp 18  Ht 5' 5.5" (1.664 m)  Wt 194 lb (87.998 kg)  BMI 31.78 kg/m2  SpO2 98%  Patient alert and oriented and in no cardiopulmonary distress.  HEENT: No facial asymmetry, EOMI,   oropharynx erythematous  and moist.  Neck supple no JVD, no mass.No adenopathy  Chest: Clear to auscultation bilaterally.  CVS: S1, S2 no murmurs, no S3.Regular rate.  ABD: Soft non tender.   Ext: No edema  MS: Adequate ROM spine, shoulders, hips and knees.Tender over left arm medical aspect, no erythema or warmth  Skin: Intact, no ulcerations or rash noted.  Psych: Good eye contact, normal affect. Memory intact not anxious or depressed appearing.  CNS: CN 2-12 intact, power,  normal throughout.no focal deficits  noted.   Assessment & Plan  HTN (hypertension) Uncontrolled, pt out of medication x 2 weeks, importance of compliance daily stressed. Meds refilled  DASH diet and commitment to daily physical activity for a minimum of 30 minutes discussed and encouraged, as a part of hypertension management. The importance of attaining a healthy weight is also discussed.  BP/Weight 07/19/2015 06/01/2015 02/23/2015 10/04/2014 09/28/2014 04/26/2014 99991111  Systolic BP A999333 123XX123 [id number] [id number]  Diastolic BP 80 64 78 75 80 90 79  Wt. (Lbs) 194 - 204 180 204 201 189  BMI 31.78 - 33.42 29.07 33.42 32.93 30.96        Insomnia Uncontrolled Sleep hygiene reviewed and written information offered also. Prescription sent for new   medication needed.   Obesity (BMI 30.0-34.9) Deteriorated. Patient re-educated about  the importance of commitment to a  minimum of 150 minutes of exercise per week.  The importance of healthy food choices with portion control discussed. Encouraged to start a food diary, count calories and to consider  joining a support group. Sample diet sheets offered. Goals set by the patient for the next several months.   Weight /BMI 07/19/2015 02/23/2015 10/04/2014  WEIGHT 194 lb 204 lb 180 lb  HEIGHT 5' 5.5" 5' 5.5" 5\' 6"   BMI 31.78 kg/m2 33.42 kg/m2 29.07 kg/m2    Current exercise per week 90 minutes.   Prediabetes Patient educated about the importance of limiting  Carbohydrate intake , the need to commit to daily physical activity for a minimum of  30 minutes , and to commit weight loss. The fact that changes in all these areas will reduce or eliminate all together the development of diabetes is stressed.  Improved  Diabetic Labs Latest Ref Rng 07/19/2015 03/17/2015 09/28/2014 04/26/2014 01/10/2014  HbA1c <5.7 % 5.9(H) 6.1(H) 6.0(H) 6.1(H) -  Chol 125 - 200 mg/dL - 137 - - -  HDL >=46 mg/dL - 44(L) - - -  Calc LDL <130 mg/dL - 71 - - -  Triglycerides <150 mg/dL - 112 - - -   Creatinine 0.50 - 1.05 mg/dL 0.86 - 0.93 0.89 0.90   BP/Weight 07/19/2015 06/01/2015 02/23/2015 10/04/2014 09/28/2014 04/26/2014 99991111  Systolic BP A999333 123XX123 [id number] 0000000 [id number] [id number] [id number]  Diastolic BP 80 64 78 75 80 90 79  Wt. (Lbs) 194 - 204 180 204 201 189  BMI 31.78 - 33.42 29.07 33.42 32.93 30.96   No flowsheet data found.     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

## 2015-07-20 LAB — BASIC METABOLIC PANEL WITH GFR
BUN: 20 mg/dL (ref 7–25)
CO2: 26 mmol/L (ref 20–31)
Calcium: 9.5 mg/dL (ref 8.6–10.4)
Chloride: 110 mmol/L (ref 98–110)
Creat: 0.86 mg/dL (ref 0.50–1.05)
Glucose, Bld: 87 mg/dL (ref 65–99)
Potassium: 3.9 mmol/L (ref 3.5–5.3)
Sodium: 144 mmol/L (ref 135–146)

## 2015-07-20 LAB — HEMOGLOBIN A1C
Hgb A1c MFr Bld: 5.9 % — ABNORMAL HIGH
Mean Plasma Glucose: 123 mg/dL — ABNORMAL HIGH

## 2015-07-20 NOTE — Assessment & Plan Note (Addendum)
Uncontrolled, pt out of medication x 2 weeks, importance of compliance daily stressed. Meds refilled  DASH diet and commitment to daily physical activity for a minimum of 30 minutes discussed and encouraged, as a part of hypertension management. The importance of attaining a healthy weight is also discussed.  BP/Weight 07/19/2015 06/01/2015 02/23/2015 10/04/2014 09/28/2014 04/26/2014 99991111  Systolic BP A999333 123XX123 [id number] 0000000 [id number] Q000111Q [id number]  Diastolic BP 80 64 78 75 80 90 79  Wt. (Lbs) 194 - 204 180 204 201 189  BMI 31.78 - 33.42 29.07 33.42 32.93 30.96

## 2015-07-23 DIAGNOSIS — B9689 Other specified bacterial agents as the cause of diseases classified elsewhere: Secondary | ICD-10-CM | POA: Insufficient documentation

## 2015-07-23 DIAGNOSIS — M79602 Pain in left arm: Secondary | ICD-10-CM | POA: Insufficient documentation

## 2015-07-23 DIAGNOSIS — J029 Acute pharyngitis, unspecified: Secondary | ICD-10-CM | POA: Insufficient documentation

## 2015-07-23 NOTE — Assessment & Plan Note (Signed)
Deteriorated. Patient re-educated about  the importance of commitment to a  minimum of 150 minutes of exercise per week.  The importance of healthy food choices with portion control discussed. Encouraged to start a food diary, count calories and to consider  joining a support group. Sample diet sheets offered. Goals set by the patient for the next several months.   Weight /BMI 07/19/2015 02/23/2015 10/04/2014  WEIGHT 194 lb 204 lb 180 lb  HEIGHT 5' 5.5" 5' 5.5" 5\' 6"   BMI 31.78 kg/m2 33.42 kg/m2 29.07 kg/m2    Current exercise per week 90 minutes.

## 2015-07-23 NOTE — Assessment & Plan Note (Signed)
Increased symptoms with change in weather, start daily meds

## 2015-07-23 NOTE — Assessment & Plan Note (Signed)
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

## 2015-07-23 NOTE — Assessment & Plan Note (Signed)
Patient educated about the importance of limiting  Carbohydrate intake , the need to commit to daily physical activity for a minimum of 30 minutes , and to commit weight loss. The fact that changes in all these areas will reduce or eliminate all together the development of diabetes is stressed.  Improved  Diabetic Labs Latest Ref Rng 07/19/2015 03/17/2015 09/28/2014 04/26/2014 01/10/2014  HbA1c <5.7 % 5.9(H) 6.1(H) 6.0(H) 6.1(H) -  Chol 125 - 200 mg/dL - 137 - - -  HDL >=46 mg/dL - 44(L) - - -  Calc LDL <130 mg/dL - 71 - - -  Triglycerides <150 mg/dL - 112 - - -  Creatinine 0.50 - 1.05 mg/dL 0.86 - 0.93 0.89 0.90   BP/Weight 07/19/2015 06/01/2015 02/23/2015 10/04/2014 09/28/2014 04/26/2014 [id number]  Systolic BP A999333 123XX123 [id number] 0000000 [id number] Q000111Q [id number]  Diastolic BP 80 64 78 75 80 90 79  Wt. (Lbs) 194 - 204 180 204 201 189  BMI 31.78 - 33.42 29.07 33.42 32.93 30.96   No flowsheet data found.

## 2015-07-23 NOTE — Assessment & Plan Note (Signed)
Controlled , continue management poer neurology, less than 2 headaches per month on average

## 2015-07-23 NOTE — Assessment & Plan Note (Signed)
Uncontrolled Sleep hygiene reviewed and written information offered also. Prescription sent for new   medication needed.

## 2015-07-23 NOTE — Assessment & Plan Note (Signed)
Normal exam and rapid strep test is negative 

## 2015-07-24 ENCOUNTER — Telehealth: Payer: Self-pay

## 2015-07-24 DIAGNOSIS — R928 Other abnormal and inconclusive findings on diagnostic imaging of breast: Secondary | ICD-10-CM

## 2015-07-24 NOTE — Telephone Encounter (Signed)
Orders entered per new protocol for 2017 and sent for signature.

## 2015-07-25 ENCOUNTER — Ambulatory Visit (HOSPITAL_COMMUNITY)
Admission: RE | Admit: 2015-07-25 | Discharge: 2015-07-25 | Disposition: A | Discharge status: Home / Self Care | Payer: BLUE CROSS/BLUE SHIELD | Source: Ambulatory Visit | Attending: Family Medicine | Admitting: Family Medicine

## 2015-07-25 DIAGNOSIS — N6002 Solitary cyst of left breast: Secondary | ICD-10-CM | POA: Insufficient documentation

## 2015-07-25 DIAGNOSIS — R928 Other abnormal and inconclusive findings on diagnostic imaging of breast: Secondary | ICD-10-CM

## 2015-07-25 DIAGNOSIS — N63 Unspecified lump in breast: Secondary | ICD-10-CM | POA: Diagnosis present

## 2015-08-15 ENCOUNTER — Emergency Department (HOSPITAL_COMMUNITY): Payer: Worker's Compensation

## 2015-08-15 ENCOUNTER — Emergency Department (HOSPITAL_COMMUNITY)
Admission: EM | Admit: 2015-08-15 | Discharge: 2015-08-15 | Disposition: A | Discharge status: Home / Self Care | Payer: Worker's Compensation | Attending: Emergency Medicine | Admitting: Emergency Medicine

## 2015-08-15 ENCOUNTER — Encounter (HOSPITAL_COMMUNITY): Payer: Self-pay | Admitting: Emergency Medicine

## 2015-08-15 DIAGNOSIS — I10 Essential (primary) hypertension: Secondary | ICD-10-CM | POA: Diagnosis not present

## 2015-08-15 DIAGNOSIS — F329 Major depressive disorder, single episode, unspecified: Secondary | ICD-10-CM | POA: Insufficient documentation

## 2015-08-15 DIAGNOSIS — Y998 Other external cause status: Secondary | ICD-10-CM | POA: Diagnosis not present

## 2015-08-15 DIAGNOSIS — Z7951 Long term (current) use of inhaled steroids: Secondary | ICD-10-CM | POA: Insufficient documentation

## 2015-08-15 DIAGNOSIS — Z8719 Personal history of other diseases of the digestive system: Secondary | ICD-10-CM | POA: Diagnosis not present

## 2015-08-15 DIAGNOSIS — S63502A Unspecified sprain of left wrist, initial encounter: Secondary | ICD-10-CM | POA: Diagnosis not present

## 2015-08-15 DIAGNOSIS — X58XXXA Exposure to other specified factors, initial encounter: Secondary | ICD-10-CM | POA: Diagnosis not present

## 2015-08-15 DIAGNOSIS — Y9289 Other specified places as the place of occurrence of the external cause: Secondary | ICD-10-CM | POA: Insufficient documentation

## 2015-08-15 DIAGNOSIS — Z79899 Other long term (current) drug therapy: Secondary | ICD-10-CM | POA: Diagnosis not present

## 2015-08-15 DIAGNOSIS — Y9389 Activity, other specified: Secondary | ICD-10-CM | POA: Diagnosis not present

## 2015-08-15 DIAGNOSIS — Z8711 Personal history of peptic ulcer disease: Secondary | ICD-10-CM | POA: Insufficient documentation

## 2015-08-15 DIAGNOSIS — F419 Anxiety disorder, unspecified: Secondary | ICD-10-CM | POA: Insufficient documentation

## 2015-08-15 DIAGNOSIS — S6992XA Unspecified injury of left wrist, hand and finger(s), initial encounter: Secondary | ICD-10-CM | POA: Diagnosis present

## 2015-08-15 NOTE — ED Provider Notes (Signed)
CSN: [id number]     Arrival date & time 08/15/15  [id number] History  By signing my name below, I, Latoya Decker, attest that this documentation has been prepared under the direction and in the presence of Ripley Fraise, MD. Electronically Signed: Hilda Decker, ED Scribe. 08/15/2015. 9:47 AM.    Chief Complaint  Patient presents with  . Wrist Pain      Patient is a 55 y.o. female presenting with wrist pain. The history is provided by the patient. No language interpreter was used.  Wrist Pain This is a new problem. The current episode started yesterday. The problem occurs constantly. The problem has not changed since onset.The symptoms are aggravated by exertion and bending. The symptoms are relieved by relaxation. She has tried nothing for the symptoms.   HPI Comments: Latoya Decker is a 55 y.o. female who presents to the Emergency Department complaining of constant unchanged left wrist pain that has been present since yesterday. 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 Pt reports she has been experiencing swelling and warmth of the wrist since the injury occurred. Pt denies a hx of gout. Pt denies weakness or tingling or numbness in her fingers.    Past Medical History  Diagnosis Date  . Anxiety   . Depression   . Headache(784.0)     migraines  . Duodenal ulcer 01/05/2010    EGD Dr Dierdre Searles, h pylori gastritis, completed treatment  . Duodenitis   . Gastritis     hospitalized 6/23-6/25 2011   . PUD (peptic ulcer disease)     Dr Tamala Julian  . S/P colonoscopy 12/27/2003    Dr Voncille Lo  . Family hx of colon cancer     Mother & sister  . Helicobacter pylori gastritis   . S/P endoscopy 12/27/2003    Dr [facility] Service superficial ulcers GEJ, duodenitis,gastritis  . Hypertension   . Eosinophilic gastroenteritis JAN 2015    [place] 2014: NL CMP/GIARDIA Ag   Past Surgical History  Procedure  Laterality Date  . Bilateral tubal ligation  1989  . Partial hysterectomy  2007    1 ovary removed  . Cholecystectomy  1997    aph-Smith  . Tubal ligation    . Polypectomy  06/10/2011    Procedure: POLYPECTOMY;  Surgeon: Dorothyann Peng, MD;  Location: [facility];  Service: Endoscopy;;  Cecal polypectomy; Sigmoid colon polypectomies  . Biopsy  06/10/2011    SLF: mild gastritis, bx benign, sb bx benign   . Abdominal hysterectomy    . Colonoscopy  05/2011    SLF: multiple hyperplastic sessile polyps, scattered diverticulosis. next TCS 05/2016 ([facility])  . Esophagogastroduodenoscopy N/A [id number]    EOS INOPHILIC GASTROENTERITIS   Family History  Problem Relation Age of Onset  . Colon cancer Mother 100  . Diabetes Mother   . Hypertension Mother   . Stroke Mother   . Heart attack Father   . Diabetes Father   . Hypertension Father   . Colon cancer Sister 66  . Lupus Brother   . Parkinsonism Brother   . Anesthesia problems Neg Hx   . Hypotension Neg Hx   . Malignant hyperthermia Neg Hx   . Pseudochol deficiency Neg Hx    Social History  Substance Use Topics  . Smoking status: Never Smoker   . Smokeless tobacco: Never Used     Comment: Never smoker  .  Alcohol Use: No   OB History    Gravida Para Term Preterm AB TAB SAB Ectopic Multiple Living   3         3     Review of Systems  Musculoskeletal: Positive for joint swelling.  Neurological: Negative for weakness and numbness.      Allergies  Codeine; Hydrocodone; and Lidocaine viscous  Home Medications   Prior to Admission medications   Medication Sig Start Date End Date Taking? Authorizing Provider  amLODipine (NORVASC) 2.5 MG tablet Take 1 tablet (2.5 mg total) by mouth daily. 07/19/15   Fayrene Helper, MD  baclofen (LIORESAL) 10 MG tablet Take 10 mg by mouth. Taking 2 tablets daily    Historical Provider, MD  fluticasone (FLONASE) 50 MCG/ACT nasal spray Place 2 sprays into both nostrils daily. 07/19/15   Fayrene Helper, MD  topiramate (TOPAMAX) 25 MG tablet TK 3 TS PO QD HS 06/26/15   Historical Provider, MD  traMADol (ULTRAM) 50 MG tablet Take 1 tablet (50 mg total) by mouth daily as needed. 07/19/15   Fayrene Helper, MD  traZODone (DESYREL) 50 MG tablet Take 1 tablet (50 mg total) by mouth at bedtime as needed for sleep. 07/19/15   Fayrene Helper, MD   BP 147/62 mmHg  Pulse 86  Temp(Src) 97.8 F (36.6 C) (Oral)  Resp 18  Ht 5\' 6"  (1.676 m)  Wt 185 lb (83.915 kg)  BMI 29.87 kg/m2  SpO2 98% Physical Exam CONSTITUTIONAL: Well developed/well nourished HEAD: Normocephalic/atraumatic ENMT: Mucous membranes moist NECK: supple no meningeal signs SPINE/BACK:entire spine nontender CV: S1/S2 noted, no murmurs/rubs/gallops noted LUNGS: Lungs are clear to auscultation bilaterally, no apparent distress NEURO: Pt is awake/alert/appropriate, moves all extremitiesx4.  No facial droop.   EXTREMITIES: pulses normal/equal, full ROM, mild swelling to left distal forearm, no erythema noted, pt has pain with movement of left wrist, able to move fingers in left hand without difficulty SKIN: warm, color normal PSYCH: no abnormalities of mood noted, alert and oriented to situation   ED Course  Procedures   DIAGNOSTIC STUDIES: Oxygen Saturation is 98% on room air, normal by my interpretation.    COORDINATION OF CARE: 9:44 AM Discussed treatment plan with pt at bedside and pt agreed to plan.   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 FINDINGS: Four views of the left wrist submitted. No acute fracture or subluxation. Mild negative ulnar variance. IMPRESSION: No acute fracture or subluxation.  Mild negative ulnar variance. Electronically Signed   By: Lahoma Crocker M.D.   On: 08/15/2015 10:08    I have personally reviewed and evaluated these images results as part of my medical decision-making.   MDM   Final diagnoses:  Sprain of left wrist, initial encounter    Nursing notes including past medical history and social history reviewed and considered in documentation xrays/imaging reviewed by myself and considered during evaluation   I personally performed the services described in this documentation, which was scribed in my presence. The recorded information has been reviewed and is accurate.       Ripley Fraise, MD 08/15/15 1049

## 2015-08-15 NOTE — ED Notes (Signed)
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

## 2015-08-17 ENCOUNTER — Encounter: Payer: Self-pay | Admitting: Family Medicine

## 2015-08-17 ENCOUNTER — Ambulatory Visit (INDEPENDENT_AMBULATORY_CARE_PROVIDER_SITE_OTHER): Payer: BLUE CROSS/BLUE SHIELD | Admitting: Family Medicine

## 2015-08-17 VITALS — BP 134/88 | HR 76 | Resp 16 | Ht 66.0 in | Wt 201.0 lb

## 2015-08-17 DIAGNOSIS — M25532 Pain in left wrist: Secondary | ICD-10-CM

## 2015-08-17 DIAGNOSIS — J3089 Other allergic rhinitis: Secondary | ICD-10-CM | POA: Diagnosis not present

## 2015-08-17 DIAGNOSIS — I10 Essential (primary) hypertension: Secondary | ICD-10-CM | POA: Diagnosis not present

## 2015-08-17 DIAGNOSIS — M25539 Pain in unspecified wrist: Secondary | ICD-10-CM | POA: Insufficient documentation

## 2015-08-17 MED ORDER — KETOROLAC TROMETHAMINE 60 MG/2ML IM SOLN
60.0000 mg | Freq: Once | INTRAMUSCULAR | Status: AC
  Administered 2015-08-17: 60 mg via INTRAMUSCULAR

## 2015-08-17 MED ORDER — ONDANSETRON HCL 4 MG PO TABS: 4.0000 mg | ORAL_TABLET | Freq: Three times a day (TID) | ORAL | Status: DC | PRN

## 2015-08-17 MED ORDER — METHYLPREDNISOLONE ACETATE 80 MG/ML IJ SUSP
80.0000 mg | Freq: Once | INTRAMUSCULAR | Status: AC
  Administered 2015-08-17: 80 mg via INTRAMUSCULAR

## 2015-08-17 NOTE — Patient Instructions (Signed)
F/u as before  You are referred urgently to orthopedics for evaluation and furhter management  Toradol and depo medrol in office today, and zofran snt for nausea to use with the tramadol if you take it  Tylenol 500 mg   4 times daily will help

## 2015-08-17 NOTE — Progress Notes (Signed)
   Subjective:    Patient ID: Latoya Decker, female    DOB: Nov 17, 1960, 55 y.o.   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 States she has appt next week with company physician but is unable to wait until this time for expert opinion due to ongoing pain and "cracking" in the limb, Xray in the E D did not show a fracture. 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 Denies sinus pressure, nasal congestion, ear pain or sore throat. Denies chest congestion, productive cough or wheezing. Denies chest pains, palpitations and leg swelling Denies skin break down or rash.        Objective:   Physical Exam BP 134/88 mmHg  Pulse 76  Resp 16  Ht 5\' 6"  (1.676 m)  Wt 201 lb (91.173 kg)  BMI 32.46 kg/m2  SpO2 97%  Patient alert and oriented and in no cardiopulmonary distress.Pt in pain  HEENT: No facial asymmetry, EOMI,   oropharynx pink and moist.  Neck supple no JVD, no mass.  Chest: Clear to auscultation bilaterally.  CVS: S1, S2 no murmurs, no S3.Regular rate.  ABD: Soft non tender.   Ext: No edema  MS: Adequate ROM spine, shoulders, hips and knees.Decreased rOM ledft forearm and wrist with swelling of the distal forearm and cracking with marked limitation in mobility  Skin: Intact, no ulcerations or rash noted.  Psych: Good eye contact, normal affect. Memory intact mildly  anxious or depressed appearing.  CNS: CN 2-12 intact, power,  normal throughout.no focal deficits noted.        Assessment & Plan:  Pain in joint, forearm Acute injury to left wrist and forearm on te job this past Monday, has been to ED, negative fracture but significant  swelling with "crack " heard with movement , pain up to a 10 with movement, needs ortho eval as soon as possible. Unable to work safely , as her job entails lifting patients, urgent referral to ortho Toradol and depo medrol in office and zofran sent for use with tramadol if she uses this  HTN (hypertension) Controlled, no change in medication   Allergic rhinitis Controlled, no change in medication

## 2015-08-17 NOTE — Assessment & Plan Note (Addendum)
Acute injury to left wrist and forearm on te job this past Monday, has been to ED, negative fracture but significant swelling with "crack " heard with movement , pain up to a 10 with movement, needs ortho eval as soon as possible. Unable to work safely , as her job entails lifting patients, urgent referral to ortho Toradol and depo medrol in office and zofran sent for use with tramadol if she uses this

## 2015-08-19 NOTE — Assessment & Plan Note (Signed)
Controlled, no change in medication  

## 2015-10-07 ENCOUNTER — Emergency Department (HOSPITAL_COMMUNITY)
Admission: EM | Admit: 2015-10-07 | Discharge: 2015-10-07 | Disposition: A | Discharge status: Home / Self Care | Payer: BLUE CROSS/BLUE SHIELD | Attending: Emergency Medicine | Admitting: Emergency Medicine

## 2015-10-07 ENCOUNTER — Encounter (HOSPITAL_COMMUNITY): Payer: Self-pay | Admitting: Emergency Medicine

## 2015-10-07 DIAGNOSIS — F329 Major depressive disorder, single episode, unspecified: Secondary | ICD-10-CM | POA: Insufficient documentation

## 2015-10-07 DIAGNOSIS — R05 Cough: Secondary | ICD-10-CM | POA: Diagnosis present

## 2015-10-07 DIAGNOSIS — I10 Essential (primary) hypertension: Secondary | ICD-10-CM | POA: Insufficient documentation

## 2015-10-07 DIAGNOSIS — J111 Influenza due to unidentified influenza virus with other respiratory manifestations: Secondary | ICD-10-CM

## 2015-10-07 MED ORDER — IBUPROFEN 800 MG PO TABS: 800.0000 mg | ORAL_TABLET | Freq: Three times a day (TID) | ORAL | Status: DC

## 2015-10-07 MED ORDER — GUAIFENESIN-CODEINE 100-10 MG/5ML PO SYRP: 5.0000 mL | ORAL_SOLUTION | Freq: Three times a day (TID) | ORAL | Status: DC | PRN

## 2015-10-07 MED ORDER — OSELTAMIVIR PHOSPHATE 75 MG PO CAPS: 75.0000 mg | ORAL_CAPSULE | Freq: Two times a day (BID) | ORAL | Status: DC

## 2015-10-07 MED ORDER — IBUPROFEN 800 MG PO TABS
800.0000 mg | ORAL_TABLET | Freq: Once | ORAL | Status: AC
  Administered 2015-10-07: 800 mg via ORAL
  Filled 2015-10-07: qty 1

## 2015-10-07 MED ORDER — OXYCODONE-ACETAMINOPHEN 5-325 MG PO TABS
2.0000 | ORAL_TABLET | Freq: Once | ORAL | Status: AC
  Administered 2015-10-07: 2 via ORAL
  Filled 2015-10-07: qty 2

## 2015-10-07 NOTE — ED Notes (Addendum)
Patient c/o flu like symptoms. Per patient cough with body aches, fever, nausea, and diarrhea. Per patient multiple coworkers recently diagnosed with flu with similar symptoms. Per patient only medication taken today was Mucinex.

## 2015-10-07 NOTE — Discharge Instructions (Signed)

## 2015-10-07 NOTE — ED Provider Notes (Signed)
CSN: [id number]     Arrival date & time 10/07/15  1742 History   First MD Initiated Contact with Patient 10/07/15 1853     Chief Complaint  Patient presents with  . Cough      HPI  Impression presents for evaluation of a 24 hours illness. Started yesterday afternoon with some body aches. Mild nausea. Had a headache yesterday and today. Has multiple coworkers with positive influenza A. Took some Mucinex today has developed a worsening headache. Has diffuse myalgias and body aches, dry cough. Nausea, diarrhea, cell fevers but has been 99.2at work today.  Past Medical History  Diagnosis Date  . Anxiety   . Depression   . Headache(784.0)     migraines  . Duodenal ulcer 01/05/2010    EGD Dr Dierdre Searles, h pylori gastritis, completed treatment  . Duodenitis   . Gastritis     hospitalized 6/23-6/25 2011   . PUD (peptic ulcer disease)     Dr Tamala Julian  . S/P colonoscopy 12/27/2003    Dr Voncille Lo  . Family hx of colon cancer     Mother & sister  . Helicobacter pylori gastritis   . S/P endoscopy 12/27/2003    Dr [facility] Service superficial ulcers GEJ, duodenitis,gastritis  . Hypertension   . Eosinophilic gastroenteritis JAN 2015    [place] 2014: NL CMP/GIARDIA Ag   Past Surgical History  Procedure Laterality Date  . Bilateral tubal ligation  1989  . Partial hysterectomy  2007    1 ovary removed  . Cholecystectomy  1997    aph-Smith  . Tubal ligation    . Polypectomy  06/10/2011    Procedure: POLYPECTOMY;  Surgeon: Dorothyann Peng, MD;  Location: [facility];  Service: Endoscopy;;  Cecal polypectomy; Sigmoid colon polypectomies  . Biopsy  06/10/2011    SLF: mild gastritis, bx benign, sb bx benign   . Abdominal hysterectomy    . Colonoscopy  05/2011    SLF: multiple hyperplastic sessile polyps, scattered diverticulosis. next TCS 05/2016 ([facility])  . Esophagogastroduodenoscopy N/A [id number]    EOS INOPHILIC GASTROENTERITIS   Family History  Problem Relation Age of Onset  . Colon cancer Mother 78   . Diabetes Mother   . Hypertension Mother   . Stroke Mother   . Heart attack Father   . Diabetes Father   . Hypertension Father   . Colon cancer Sister 69  . Lupus Brother   . Parkinsonism Brother   . Anesthesia problems Neg Hx   . Hypotension Neg Hx   . Malignant hyperthermia Neg Hx   . Pseudochol deficiency Neg Hx    Social History  Substance Use Topics  . Smoking status: Never Smoker   . Smokeless tobacco: Never Used     Comment: Never smoker  . Alcohol Use: No   OB History    Gravida Para Term Preterm AB TAB SAB Ectopic Multiple Living   3         3     Review of Systems  Constitutional: Positive for fever and fatigue. Negative for chills, diaphoresis and appetite change.  HENT: Positive for congestion. Negative for mouth sores, sore throat and trouble swallowing.   Eyes: Negative for visual disturbance.  Respiratory: Positive for cough. Negative for chest tightness, shortness of breath and wheezing.   Cardiovascular: Negative for chest pain.  Gastrointestinal: Positive for nausea and diarrhea. Negative for vomiting, abdominal pain and abdominal distention.  Endocrine: Negative for polydipsia, polyphagia and polyuria.  Genitourinary: Negative for dysuria,  frequency and hematuria.  Musculoskeletal: Positive for myalgias. Negative for gait problem.  Skin: Negative for color change, pallor and rash.  Neurological: Positive for headaches. Negative for dizziness, syncope and light-headedness.  Hematological: Does not bruise/bleed easily.  Psychiatric/Behavioral: Negative for behavioral problems and confusion.      Allergies  Codeine; Hydrocodone; Lidocaine viscous; and Nsaids  Home Medications   Prior to Admission medications   Medication Sig Start Date End Date Taking? Authorizing Provider  amLODipine (NORVASC) 2.5 MG tablet Take 1 tablet (2.5 mg total) by mouth daily. 07/19/15   Fayrene Helper, MD  baclofen (LIORESAL) 10 MG tablet Take 10 mg by mouth daily as  needed (headaches).     Historical Provider, MD  fluticasone (FLONASE) 50 MCG/ACT nasal spray Place 2 sprays into both nostrils daily. 07/19/15   Fayrene Helper, MD  guaiFENesin-codeine (CHERATUSSIN AC) 100-10 MG/5ML syrup Take 5 mLs by mouth 3 (three) times daily as needed for cough. 10/07/15   Tanna Furry, MD  ibuprofen (ADVIL,MOTRIN) 800 MG tablet Take 1 tablet (800 mg total) by mouth 3 (three) times daily. 10/07/15   Tanna Furry, MD  ondansetron (ZOFRAN) 4 MG tablet Take 1 tablet (4 mg total) by mouth every 8 (eight) hours as needed for nausea or vomiting. 08/17/15   Fayrene Helper, MD  oseltamivir (TAMIFLU) 75 MG capsule Take 1 capsule (75 mg total) by mouth every 12 (twelve) hours. 10/07/15   Tanna Furry, MD  topiramate (TOPAMAX) 25 MG capsule Take 100 mg by mouth at bedtime.    Historical Provider, MD  traZODone (DESYREL) 50 MG tablet Take 1 tablet (50 mg total) by mouth at bedtime as needed for sleep. 07/19/15   Fayrene Helper, MD   BP 123/83 mmHg  Pulse 109  Temp(Src) 99.1 F (37.3 C) (Oral)  Resp 18  Ht 5\' 7"  (1.702 m)  Wt 180 lb (81.647 kg)  BMI 28.19 kg/m2  SpO2 98% Physical Exam  Constitutional: She is oriented to person, place, and time. She appears well-developed and well-nourished. She appears ill. No distress.  Patient is not. Not frankly septic toxic. Mentating well. Feeling under a blanket with chills  HENT:  Head: Normocephalic.  Eyes: Conjunctivae are normal. Pupils are equal, round, and reactive to light. No scleral icterus.  Neck: Normal range of motion. Neck supple. No thyromegaly present.  Cardiovascular: Normal rate and regular rhythm.  Exam reveals no gallop and no friction rub.   No murmur heard. Pulmonary/Chest: Effort normal and breath sounds normal. No respiratory distress. She has no wheezes. She has no rales.  Bilateral breath sounds clear.  Abdominal: Soft. Bowel sounds are normal. She exhibits no distension. There is no tenderness. There is no rebound.   Musculoskeletal: Normal range of motion.  Neurological: She is alert and oriented to person, place, and time.  Skin: Skin is warm and dry. No rash noted.  No rash.  Psychiatric: She has a normal mood and affect. Her behavior is normal.    ED Course  Procedures (including critical care time) Labs Review Labs Reviewed - No data to display  Imaging Review No results found. I have personally reviewed and evaluated these images and lab results as part of my medical decision-making.   EKG Interpretation None      MDM   Final diagnoses:  Influenza    Not hypoxemic. Is febrile here 101.9. Bodyaches. Supple neck. Symptoms consistent with influenza. Has multiple coworkers with influenza A. Plan will be symptomatically treatment. Offered Tamiflu.  Tanna Furry, MD 10/07/15 Lurena Nida

## 2015-11-30 ENCOUNTER — Ambulatory Visit: Payer: BLUE CROSS/BLUE SHIELD | Admitting: Family Medicine

## 2016-01-18 ENCOUNTER — Ambulatory Visit (INDEPENDENT_AMBULATORY_CARE_PROVIDER_SITE_OTHER): Payer: BLUE CROSS/BLUE SHIELD | Admitting: Family Medicine

## 2016-01-18 ENCOUNTER — Encounter: Payer: Self-pay | Admitting: Family Medicine

## 2016-01-18 VITALS — BP 132/82 | HR 72 | Resp 18 | Ht 66.0 in | Wt 203.0 lb

## 2016-01-18 DIAGNOSIS — E669 Obesity, unspecified: Secondary | ICD-10-CM

## 2016-01-18 DIAGNOSIS — I10 Essential (primary) hypertension: Secondary | ICD-10-CM | POA: Diagnosis not present

## 2016-01-18 DIAGNOSIS — R7303 Prediabetes: Secondary | ICD-10-CM | POA: Diagnosis not present

## 2016-01-18 DIAGNOSIS — K529 Noninfective gastroenteritis and colitis, unspecified: Secondary | ICD-10-CM

## 2016-01-18 DIAGNOSIS — Z1211 Encounter for screening for malignant neoplasm of colon: Secondary | ICD-10-CM

## 2016-01-18 DIAGNOSIS — E559 Vitamin D deficiency, unspecified: Secondary | ICD-10-CM

## 2016-01-18 DIAGNOSIS — J3089 Other allergic rhinitis: Secondary | ICD-10-CM

## 2016-01-18 DIAGNOSIS — E66811 Obesity, class 1: Secondary | ICD-10-CM

## 2016-01-18 DIAGNOSIS — G4700 Insomnia, unspecified: Secondary | ICD-10-CM

## 2016-01-18 DIAGNOSIS — G43809 Other migraine, not intractable, without status migrainosus: Secondary | ICD-10-CM

## 2016-01-18 LAB — HEMOCCULT GUIAC POC 1CARD (OFFICE): Fecal Occult Blood, POC: NEGATIVE

## 2016-01-18 MED ORDER — DIPHENOXYLATE-ATROPINE 2.5-0.025 MG PO TABS: 1.0000 | ORAL_TABLET | Freq: Four times a day (QID) | ORAL | Status: AC | PRN

## 2016-01-18 NOTE — Patient Instructions (Addendum)
Annual physical exam in early October, call if you need me sooner  Fasting labs 1 week before visit please  You  are treated for acute gastroenteritis, ensure you stay hydrated, drink small quantities frequently, and follow BRAT diet Hand washing reduce spread of germs and possibly getting someone else sick  Lomotil sent in to help with diarrhea, no blood in stool, which is good  Viral Gastroenteritis Viral gastroenteritis is also called stomach flu. This illness is caused by a certain type of germ (virus). It can cause sudden watery poop (diarrhea) and throwing up (vomiting). This can cause you to lose body fluids (dehydration). This illness usually lasts for 3 to 8 days. It usually goes away on its own. HOME CARE   Drink enough fluids to keep your pee (urine) clear or pale yellow. Drink small amounts of fluids often.  Ask your doctor how to replace body fluid losses (rehydration).  Avoid:  Foods high in sugar.  Alcohol.  Bubbly (carbonated) drinks.  Tobacco.  Juice.  Caffeine drinks.  Very hot or cold fluids.  Fatty, greasy foods.  Eating too much at one time.  Dairy products until 24 to 48 hours after your watery poop stops.  You may eat foods with active cultures (probiotics). They can be found in some yogurts and supplements.  Wash your hands well to avoid spreading the illness.  Only take medicines as told by your doctor. Do not give aspirin to children. Do not take medicines for watery poop (antidiarrheals).  Ask your doctor if you should keep taking your regular medicines.  Keep all doctor visits as told. GET HELP RIGHT AWAY IF:   You cannot keep fluids down.  You do not pee at least once every 6 to 8 hours.  You are short of breath.  You see blood in your poop or throw up. This may look like coffee grounds.  You have belly (abdominal) pain that gets worse or is just in one small spot (localized).  You keep throwing up or having watery poop.  You  have a fever.  The patient is a child younger than 3 months, and he or she has a fever.  The patient is a child older than 3 months, and he or she has a fever and problems that do not go away.  The patient is a child older than 3 months, and he or she has a fever and problems that suddenly get worse.  The patient is a baby, and he or she has no tears when crying. MAKE SURE YOU:   Understand these instructions.  Will watch your condition.  Will get help right away if you are not doing well or get worse.   This information is not intended to replace advice given to you by your health care provider. Make sure you discuss any questions you have with your health care provider.   Document Released: 12/18/2007 Document Revised: 09/23/2011 Document Reviewed: 04/17/2011 Elsevier Interactive Patient Education [employer].

## 2016-01-20 NOTE — Assessment & Plan Note (Signed)
Patient educated about the importance of limiting  Carbohydrate intake , the need to commit to daily physical activity for a minimum of 30 minutes , and to commit weight loss. The fact that changes in all these areas will reduce or eliminate all together the development of diabetes is stressed.   Diabetic Labs Latest Ref Rng 07/19/2015 03/17/2015 09/28/2014 04/26/2014 01/10/2014  HbA1c <5.7 % 5.9(H) 6.1(H) 6.0(H) 6.1(H) -  Chol 125 - 200 mg/dL - 137 - - -  HDL >=46 mg/dL - 44(L) - - -  Calc LDL <130 mg/dL - 71 - - -  Triglycerides <150 mg/dL - 112 - - -  Creatinine 0.50 - 1.05 mg/dL 0.86 - 0.93 0.89 0.90   BP/Weight 01/18/2016 10/07/2015 08/17/2015 08/15/2015 07/19/2015 06/01/2015 [id number]  Systolic BP Q000111Q [id number] Q000111Q Q000111Q [id number]  Diastolic BP 82 83 88 62 80 64 78  Wt. (Lbs) 203 180 201 185 194 - 204  BMI 32.78 28.19 32.46 29.87 31.78 - 33.42   No flowsheet data found.  Updated lab needed at/ before next visit.

## 2016-01-20 NOTE — Assessment & Plan Note (Addendum)
Controlled, no change in medication  

## 2016-01-20 NOTE — Assessment & Plan Note (Signed)
Controlled, no change in medication  

## 2016-01-20 NOTE — Progress Notes (Signed)
Latoya Decker     MRN: [id number]      DOB: 1961-02-05   HPI Latoya Decker is here for follow up and re-evaluation of chronic medical conditions, medication management and review of any available recent lab and radiology data.  Preventive health is updated, specifically  Cancer screening and Immunization.   2 day h/o stomach cramps with loose watery frequent stool and nausea, no emesis, no chills , no  other family member affected . States stool is black Recent increase in stress with passing of her father in law, pastor and his wife over a 4 week period, no regular exercise and deterioration in eating habits  ROS Denies recent fever or chills. Denies sinus pressure, nasal congestion, ear pain or sore throat. Denies chest congestion, productive cough or wheezing. Denies chest pains, palpitations and leg swelling   Denies dysuria, frequency, hesitancy or incontinence. Denies joint pain, swelling and limitation in mobility. Denies headaches, seizures, numbness, or tingling. Denies depression, anxiety or insomnia. Denies skin break down or rash.   PE  BP 132/82 mmHg  Pulse 72  Resp 18  Ht 5\' 6"  (1.676 m)  Wt 203 lb (92.08 kg)  BMI 32.78 kg/m2  SpO2 98%  Patient alert and oriented and in no cardiopulmonary distress.  HEENT: No facial asymmetry, EOMI,   oropharynx pink and moist.  Neck supple no JVD, no mass.  Chest: Clear to auscultation bilaterally.  CVS: S1, S2 no murmurs, no S3.Regular rate.  ABD: Soft hyperactive  Bowel sounds, left lowe quadrant tenderness, no guarding or rebound. Rectal: heme negative stool  Ext: No edema  MS: Adequate ROM spine, shoulders, hips and knees.  Skin: Intact, no ulcerations or rash noted.  Psych: Good eye contact, normal affect. Memory intact not anxious or depressed appearing.  CNS: CN 2-12 intact, power,  normal throughout.no focal deficits noted.   Assessment & Plan  *Acute gastroenteritis Pt educated re need to practice  good hand hygiene and to keep hydrated. already has symptomatic medication at home to use, imodium, phenergan/ zofran and took bentyl already with goodeffect  Migraine variant without intractability Controlled, no change in medication   Allergic rhinitis  Controlled, no change in medication       HTN (hypertension) Controlled, no change in medication DASH diet and commitment to daily physical activity for a minimum of 30 minutes discussed and encouraged, as a part of hypertension management. The importance of attaining a healthy weight is also discussed.  BP/Weight 01/18/2016 10/07/2015 08/17/2015 08/15/2015 07/19/2015 06/01/2015 Q000111Q  Systolic BP Q000111Q [id number] Q000111Q Q000111Q [id number]  Diastolic BP 82 83 88 62 80 64 78  Wt. (Lbs) 203 180 201 185 194 - 204  BMI 32.78 28.19 32.46 29.87 31.78 - 33.42        Obesity (BMI 30.0-34.9) Deteriorated. Patient re-educated about  the importance of commitment to a  minimum of 150 minutes of exercise per week.  The importance of healthy food choices with portion control discussed. Encouraged to start a food diary, count calories and to consider  joining a support group. Sample diet sheets offered. Goals set by the patient for the next several months.   Weight /BMI 01/18/2016 10/07/2015 08/17/2015  WEIGHT 203 lb 180 lb 201 lb  HEIGHT 5\' 6"  5\' 7"  5\' 6"   BMI 32.78 kg/m2 28.19 kg/m2 32.46 kg/m2       Insomnia Sleep hygiene reviewed and written information offered also. Prescription sent for  medication needed.   Prediabetes Patient  educated about the importance of limiting  Carbohydrate intake , the need to commit to daily physical activity for a minimum of 30 minutes , and to commit weight loss. The fact that changes in all these areas will reduce or eliminate all together the development of diabetes is stressed.   Diabetic Labs Latest Ref Rng 07/19/2015 03/17/2015 09/28/2014 04/26/2014 01/10/2014  HbA1c <5.7 % 5.9(H) 6.1(H) 6.0(H) 6.1(H) -  Chol  125 - 200 mg/dL - 137 - - -  HDL >=46 mg/dL - 44(L) - - -  Calc LDL <130 mg/dL - 71 - - -  Triglycerides <150 mg/dL - 112 - - -  Creatinine 0.50 - 1.05 mg/dL 0.86 - 0.93 0.89 0.90   BP/Weight 01/18/2016 10/07/2015 08/17/2015 08/15/2015 07/19/2015 06/01/2015 Q000111Q  Systolic BP Q000111Q [id number] Q000111Q Q000111Q [id number]  Diastolic BP 82 83 88 62 80 64 78  Wt. (Lbs) 203 180 201 185 194 - 204  BMI 32.78 28.19 32.46 29.87 31.78 - 33.42   No flowsheet data found.  Updated lab needed at/ before next visit.

## 2016-01-20 NOTE — Assessment & Plan Note (Signed)
Pt educated re need to practice good hand hygiene and to keep hydrated. already has symptomatic medication at home to use, imodium, phenergan/ zofran and took bentyl already with goodeffect

## 2016-01-20 NOTE — Assessment & Plan Note (Signed)
Controlled, no change in medication DASH diet and commitment to daily physical activity for a minimum of 30 minutes discussed and encouraged, as a part of hypertension management. The importance of attaining a healthy weight is also discussed.  BP/Weight 01/18/2016 10/07/2015 08/17/2015 08/15/2015 07/19/2015 06/01/2015 Q000111Q  Systolic BP Q000111Q [id number] Q000111Q Q000111Q [id number]  Diastolic BP 82 83 88 62 80 64 78  Wt. (Lbs) 203 180 201 185 194 - 204  BMI 32.78 28.19 32.46 29.87 31.78 - 33.42

## 2016-01-20 NOTE — Assessment & Plan Note (Signed)
Sleep hygiene reviewed and written information offered also. Prescription sent for  medication needed.  

## 2016-01-20 NOTE — Assessment & Plan Note (Signed)
Deteriorated. Patient re-educated about  the importance of commitment to a  minimum of 150 minutes of exercise per week.  The importance of healthy food choices with portion control discussed. Encouraged to start a food diary, count calories and to consider  joining a support group. Sample diet sheets offered. Goals set by the patient for the next several months.   Weight /BMI 01/18/2016 10/07/2015 08/17/2015  WEIGHT 203 lb 180 lb 201 lb  HEIGHT 5\' 6"  5\' 7"  5\' 6"   BMI 32.78 kg/m2 28.19 kg/m2 32.46 kg/m2

## 2016-05-20 ENCOUNTER — Encounter: Payer: Self-pay | Admitting: Gastroenterology

## 2016-06-11 ENCOUNTER — Encounter: Payer: Self-pay | Admitting: Family Medicine

## 2016-07-18 ENCOUNTER — Ambulatory Visit: Payer: Self-pay | Admitting: Family Medicine

## 2016-09-03 ENCOUNTER — Ambulatory Visit (INDEPENDENT_AMBULATORY_CARE_PROVIDER_SITE_OTHER): Payer: BLUE CROSS/BLUE SHIELD | Admitting: Family Medicine

## 2016-09-03 ENCOUNTER — Encounter: Payer: Self-pay | Admitting: Family Medicine

## 2016-09-03 VITALS — BP 120/74 | HR 82 | Temp 97.6°F | Resp 20 | Ht 66.0 in | Wt 207.0 lb

## 2016-09-03 DIAGNOSIS — J111 Influenza due to unidentified influenza virus with other respiratory manifestations: Secondary | ICD-10-CM

## 2016-09-03 MED ORDER — OSELTAMIVIR PHOSPHATE 75 MG PO CAPS: 75.0000 mg | ORAL_CAPSULE | Freq: Two times a day (BID) | ORAL | 0 refills | Status: DC

## 2016-09-03 MED ORDER — FLUTICASONE PROPIONATE 50 MCG/ACT NA SUSP: 2.0000 | Freq: Every day | NASAL | 1 refills | Status: DC

## 2016-09-03 NOTE — Patient Instructions (Signed)
Take the tamiflu  Start ASAP today Take tylenol or ibuprofen for fever Drink plenty of water/liquids No work until you have had no fever or symptoms for 24 hours

## 2016-09-03 NOTE — Progress Notes (Signed)
Chief Complaint  Patient presents with  . URI  . Sore Throat    since sat   Patient is here for an acute illness. Her regular primary care physician is [name] M.D. 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 Her temperature is been as high as 101. She is very tired. She has nausea but no vomiting. She is experiencing body aches. No sputum production. No purulent nasal drainage. No appetite. She did get a flu shot this year.  Patient Active Problem List   Diagnosis Date Noted  . Acute gastroenteritis 01/18/2016  . Prediabetes 05/01/2014  . Allergic rhinitis 11/24/2013  . Eosinophilic gastroenteritis [id number]  . Insomnia 07/01/2013  . HTN (hypertension) 01/13/2013  . Migraine variant without intractability 08/31/2012  . Family hx of colon cancer 05/14/2011  . Duodenal ulcer without hemorrhage or perforation and without obstruction 01/12/2010  . Obesity (BMI 30.0-34.9) 03/30/2008    Outpatient Encounter Prescriptions as of 09/03/2016  Medication Sig  . amLODipine (NORVASC) 2.5 MG tablet Take 1 tablet (2.5 mg total) by mouth daily.  . baclofen (LIORESAL) 10 MG tablet Take 10 mg by mouth daily as needed (headaches).   . fluticasone (FLONASE) 50 MCG/ACT nasal spray Place 2 sprays into both nostrils daily.  Marland Kitchen topiramate (TOPAMAX) 25 MG capsule Take 100 mg by mouth at bedtime.  . traZODone (DESYREL) 50 MG tablet Take 1 tablet (50 mg total) by mouth at bedtime as needed for sleep.  . [DISCONTINUED] fluticasone (FLONASE) 50 MCG/ACT nasal spray Place 2 sprays into both nostrils daily.  Marland Kitchen oseltamivir (TAMIFLU) 75 MG capsule Take 1 capsule (75 mg total) by mouth 2 (two) times daily.   No facility-administered encounter medications on file as of 09/03/2016.     Allergies  Allergen Reactions  . Codeine Nausea And Vomiting  . Hydrocodone Nausea And Vomiting  . Lidocaine  Viscous     Couldn't think straight  . Nsaids     Can't take due to having diverticulitis    Review of Systems  Constitutional: Positive for appetite change, chills, diaphoresis, fatigue and fever.       Sweats, chills, fever, fatigue  HENT: Positive for congestion, postnasal drip, rhinorrhea, sinus pressure and sore throat.        Still with runny nose and postnasal drip, nasal congestion, sore throat improved  Eyes: Negative for redness and visual disturbance.  Respiratory: Positive for cough. Negative for shortness of breath.   Cardiovascular: Negative for chest pain, palpitations and leg swelling.  Gastrointestinal: Positive for nausea. Negative for vomiting.  Genitourinary: Negative for difficulty urinating and dysuria.  Musculoskeletal: Positive for arthralgias and myalgias.       General body aches  Neurological: Negative for dizziness and headaches.  Psychiatric/Behavioral: Negative for sleep disturbance. The patient is not nervous/anxious.    BP 120/74 (BP Location: Right Arm, Patient Position: Sitting, Cuff Size: Large)   Pulse 82   Temp 97.6 F (36.4 C) (Temporal)   Resp 20   Ht 5\' 6"  (1.676 m)   Wt 207 lb (93.9 kg)   SpO2 100%   BMI 33.41 kg/m   Physical Exam  Constitutional: She is oriented to person, place, and time. She appears well-developed and well-nourished.  Warm clammy skin. Appears moderately ill. Looks tired.  HENT:  Head: Normocephalic and atraumatic.  Right Ear: External ear normal.  Left Ear: External ear normal.  Mouth/Throat:  Oropharynx is clear and moist.  Posterior pharynx is injected. Nasal membranes are swollen and red. Clear rhinorrhea  Eyes: EOM are normal. Pupils are equal, round, and reactive to light.  Neck: Normal range of motion.  Cardiovascular: Normal rate, regular rhythm and normal heart sounds.   Pulmonary/Chest: Effort normal and breath sounds normal. She has no wheezes.  Abdominal: Soft. Bowel sounds are normal. There is no  tenderness.  Musculoskeletal: Normal range of motion. She exhibits no edema.  Lymphadenopathy:    She has no cervical adenopathy.  Neurological: She is alert and oriented to person, place, and time.  Skin: She is diaphoretic.  Psychiatric: She has a normal mood and affect. Her behavior is normal.    ASSESSMENT/PLAN:  1. Influenza with upper respiratory symptoms    Patient Instructions  Take the tamiflu  Start ASAP today Take tylenol or ibuprofen for fever Drink plenty of water/liquids No work until you have had no fever or symptoms for 24 hours   Raylene Everts, MD

## 2016-10-15 ENCOUNTER — Other Ambulatory Visit: Payer: Self-pay | Admitting: Family Medicine

## 2016-10-15 ENCOUNTER — Ambulatory Visit (INDEPENDENT_AMBULATORY_CARE_PROVIDER_SITE_OTHER): Payer: BLUE CROSS/BLUE SHIELD | Admitting: Family Medicine

## 2016-10-15 ENCOUNTER — Encounter: Payer: Self-pay | Admitting: Family Medicine

## 2016-10-15 VITALS — BP 124/80 | HR 75 | Resp 16 | Ht 66.0 in | Wt 204.0 lb

## 2016-10-15 DIAGNOSIS — R42 Dizziness and giddiness: Secondary | ICD-10-CM | POA: Insufficient documentation

## 2016-10-15 DIAGNOSIS — Z1211 Encounter for screening for malignant neoplasm of colon: Secondary | ICD-10-CM | POA: Diagnosis not present

## 2016-10-15 DIAGNOSIS — E669 Obesity, unspecified: Secondary | ICD-10-CM

## 2016-10-15 DIAGNOSIS — M659 Synovitis and tenosynovitis, unspecified: Secondary | ICD-10-CM

## 2016-10-15 DIAGNOSIS — R7303 Prediabetes: Secondary | ICD-10-CM | POA: Diagnosis not present

## 2016-10-15 DIAGNOSIS — G43809 Other migraine, not intractable, without status migrainosus: Secondary | ICD-10-CM | POA: Diagnosis not present

## 2016-10-15 DIAGNOSIS — I10 Essential (primary) hypertension: Secondary | ICD-10-CM | POA: Diagnosis not present

## 2016-10-15 DIAGNOSIS — Z800 Family history of malignant neoplasm of digestive organs: Secondary | ICD-10-CM

## 2016-10-15 DIAGNOSIS — R110 Nausea: Secondary | ICD-10-CM | POA: Insufficient documentation

## 2016-10-15 DIAGNOSIS — R748 Abnormal levels of other serum enzymes: Secondary | ICD-10-CM

## 2016-10-15 MED ORDER — MECLIZINE HCL 25 MG PO TABS: 25.0000 mg | ORAL_TABLET | Freq: Three times a day (TID) | ORAL | 0 refills | Status: DC | PRN

## 2016-10-15 MED ORDER — METHYLPREDNISOLONE ACETATE 80 MG/ML IJ SUSP
80.0000 mg | Freq: Once | INTRAMUSCULAR | Status: AC
  Administered 2016-10-15: 80 mg via INTRAMUSCULAR

## 2016-10-15 MED ORDER — KETOROLAC TROMETHAMINE 60 MG/2ML IM SOLN
60.0000 mg | Freq: Once | INTRAMUSCULAR | Status: AC
  Administered 2016-10-15: 60 mg via INTRAMUSCULAR

## 2016-10-15 MED ORDER — ONDANSETRON HCL 4 MG PO TABS: 4.0000 mg | ORAL_TABLET | Freq: Three times a day (TID) | ORAL | 0 refills | Status: DC | PRN

## 2016-10-15 MED ORDER — BACLOFEN 10 MG PO TABS: ORAL_TABLET | ORAL | 1 refills | Status: DC

## 2016-10-15 MED ORDER — ONDANSETRON HCL 4 MG/2ML IJ SOLN
4.0000 mg | Freq: Once | INTRAMUSCULAR | Status: AC
  Administered 2016-10-15: 4 mg via INTRAMUSCULAR

## 2016-10-15 NOTE — Patient Instructions (Addendum)
Physical exam in 5 month, call if you need me sooner  You are treated for acute vertigo, nausea and headache  Injections in office, and medications sent to the pharmacy (3)  Work excuse from today , return 10/17/2016  You need to contact Dr Nona Dell office schedule appointment  You are referred to or thopedics re right hand pain, they will call you  Labs today  Hope you feel better soon.  Blood pressure normal, no need for medication   Please work on good  health habits so that your health will improve. 1. Commitment to daily physical activity for 30 to 60  minutes, if you are able to do this.  2. Commitment to wise food choices. Aim for half of your  food intake to be vegetable and fruit, one quarter starchy foods, and one quarter protein. Try to eat on a regular schedule  3 meals per day, snacking between meals should be limited to vegetables or fruits or small portions of nuts. 64 ounces of water per day is generally recommended, unless you have specific health conditions, like heart failure or kidney failure where you will need to limit fluid intake.  3. Commitment to sufficient and a  good quality of physical and mental rest daily, generally between 6 to 8 hours per day.  WITH PERSISTANCE AND PERSEVERANCE, THE IMPOSSIBLE , BECOMES THE NORM! Thank you  for choosing [facility]. We consider it a privelige to serve you.  Delivering excellent health care in a caring and  compassionate way is our goal.  Partnering with you,  so that together we can achieve this goal is our strategy.

## 2016-10-15 NOTE — Assessment & Plan Note (Addendum)
Uncontrolled.Toradol and depo medrol administered IM in the office , improved generally, infrequent episodes which respond to baclofen, uses topamax chronically

## 2016-10-16 DIAGNOSIS — R748 Abnormal levels of other serum enzymes: Secondary | ICD-10-CM | POA: Insufficient documentation

## 2016-10-16 LAB — CBC
HCT: 43.5 % (ref 35.0–45.0)
Hemoglobin: 14.4 g/dL (ref 11.7–15.5)
MCH: 29.6 pg (ref 27.0–33.0)
MCHC: 33.1 g/dL (ref 32.0–36.0)
MCV: 89.3 fL (ref 80.0–100.0)
MPV: 9.4 fL (ref 7.5–12.5)
PLATELETS: 293 10*3/uL (ref 140–400)
RBC: 4.87 MIL/uL (ref 3.80–5.10)
RDW: 13.5 % (ref 11.0–15.0)
WBC: 7.8 10*3/uL (ref 3.8–10.8)

## 2016-10-16 LAB — COMPREHENSIVE METABOLIC PANEL
ALBUMIN: 4.2 g/dL (ref 3.6–5.1)
ALK PHOS: 157 U/L — AB (ref 33–130)
ALT: 13 U/L (ref 6–29)
AST: 16 U/L (ref 10–35)
BUN: 10 mg/dL (ref 7–25)
CHLORIDE: 109 mmol/L (ref 98–110)
CO2: 23 mmol/L (ref 20–31)
CREATININE: 0.97 mg/dL (ref 0.50–1.05)
Calcium: 9.1 mg/dL (ref 8.6–10.4)
Glucose, Bld: 88 mg/dL (ref 65–99)
POTASSIUM: 3.9 mmol/L (ref 3.5–5.3)
SODIUM: 142 mmol/L (ref 135–146)
Total Bilirubin: 0.5 mg/dL (ref 0.2–1.2)
Total Protein: 6.9 g/dL (ref 6.1–8.1)

## 2016-10-16 LAB — LIPID PANEL
Cholesterol: 133 mg/dL (ref <200)
HDL: 41 mg/dL — AB (ref >50)
LDL Cholesterol: 68 mg/dL (ref <100)
TRIGLYCERIDES: 120 mg/dL (ref <150)
Total CHOL/HDL Ratio: 3.2 Ratio (ref <5.0)
VLDL: 24 mg/dL (ref <30)

## 2016-10-16 LAB — TSH: TSH: 0.54 mIU/L

## 2016-10-16 LAB — HEMOGLOBIN A1C
Hgb A1c MFr Bld: 5.8 % — ABNORMAL HIGH (ref <5.7)
Mean Plasma Glucose: 120 mg/dL

## 2016-10-16 NOTE — Assessment & Plan Note (Signed)
Elevated alk phos unclear etiology, gI to eval

## 2016-10-20 ENCOUNTER — Encounter: Payer: Self-pay | Admitting: Family Medicine

## 2016-10-20 DIAGNOSIS — M659 Synovitis and tenosynovitis, unspecified: Secondary | ICD-10-CM | POA: Insufficient documentation

## 2016-10-20 DIAGNOSIS — M65941 Unspecified synovitis and tenosynovitis, right hand: Secondary | ICD-10-CM | POA: Insufficient documentation

## 2016-10-20 NOTE — Assessment & Plan Note (Signed)
Patient educated about the importance of limiting  Carbohydrate intake , the need to commit to daily physical activity for a minimum of 30 minutes , and to commit weight loss. The fact that changes in all these areas will reduce or eliminate all together the development of diabetes is stressed.  Improved, which is good  Diabetic Labs Latest Ref Rng & Units 10/15/2016 07/19/2015 03/17/2015 09/28/2014 04/26/2014  HbA1c <5.7 % 5.8(H) 5.9(H) 6.1(H) 6.0(H) 6.1(H)  Chol <200 mg/dL 133 - 137 - -  HDL >50 mg/dL 41(L) - 44(L) - -  Calc LDL <100 mg/dL 68 - 71 - -  Triglycerides <150 mg/dL 120 - 112 - -  Creatinine 0.50 - 1.05 mg/dL 0.97 0.86 - 0.93 0.89   BP/Weight 10/15/2016 09/03/2016 01/18/2016 10/07/2015 08/17/2015 1/61/0960 10/17/4096  Systolic BP 119 147 829 562 130 865 784  Diastolic BP 80 74 82 83 88 62 80  Wt. (Lbs) 204 207 203 180 201 185 194  BMI 32.93 33.41 32.78 28.19 32.46 29.87 31.78   No flowsheet data found.

## 2016-10-20 NOTE — Assessment & Plan Note (Addendum)
Unchanged Patient re-educated about  the importance of commitment to a  minimum of 150 minutes of exercise per week.  The importance of healthy food choices with portion control discussed. Encouraged to start a food diary, count calories and to consider  joining a support group. Sample diet sheets offered. Goals set by the patient for the next several months.   Weight /BMI 10/15/2016 09/03/2016 01/18/2016  WEIGHT 204 lb 207 lb 203 lb  HEIGHT 5\' 6"  5\' 6"  5\' 6"   BMI 32.93 kg/m2 33.41 kg/m2 32.78 kg/m2

## 2016-10-20 NOTE — Assessment & Plan Note (Signed)
Needs to be re evaluated by GI has not scheduled as requested by GI, encouraged to do so

## 2016-10-20 NOTE — Assessment & Plan Note (Signed)
Uncontrolled and debiltating right hand pain, s/s consistent with tenosynovitis, ortho eval and manahgement

## 2016-10-20 NOTE — Assessment & Plan Note (Signed)
Acute onset, meclizine every 8 hrs , then as needed, work excuse x 2 days

## 2016-10-20 NOTE — Assessment & Plan Note (Signed)
zofran in office , associated with headache and vertigo, tablets prescribed also , and work excuse to return 10/17/2016

## 2016-10-20 NOTE — Progress Notes (Signed)
Latoya Decker     MRN: [id number]      DOB: 1961-01-30   HPI Latoya Decker is here for follow up and re-evaluation of chronic medical conditions, medication management and review of any available recent lab and radiology data.  Preventive health is updated, specifically  Cancer screening and Immunization.  Stopped BP medication for over 1 month and getting normal Blood pressures One day h/o acute vertigo, nausea and severe headache rated at 10, states generally her migraines have been controlled. Several month h/o right hand pain limiting function and disturbing sleep    ROS Denies recent fever or chills. Denies sinus pressure, nasal congestion, ear pain or sore throat. Denies chest congestion, productive cough or wheezing. Denies chest pains, palpitations and leg swelling Denies ,diarrhea or constipation.   Denies dysuria, frequency, hesitancy or incontinence. . Denies , seizures, numbness, or tingling. Denies depression, anxiety or insomnia. Denies skin break down or rash.   PE  BP 124/80   Pulse 75   Resp 16   Ht 5' 6" (1.676 m)   Wt 204 lb (92.5 kg)   SpO2 98%   BMI 32.93 kg/m   Patient alert and oriented and in no cardiopulmonary distress. Pt in pain HEENT: No facial asymmetry, EOMI, PERTL,   oropharynx pink and moist.  Neck supple no JVD, no mass.Nystagmus present  Chest: Clear to auscultation bilaterally.  CVS: S1, S2 no murmurs, no S3.Regular rate.  ABD: Soft non tender. Normal BS  Ext: No edema  MS: Adequate ROM spine, shoulders, hips and knees.  Skin: Intact, no ulcerations or rash noted.  Psych: Good eye contact, normal affect. Memory intact not anxious or depressed appearing.  CNS: CN 2-12 intact, power,  normal throughout.no focal deficits noted.   Assessment & Plan  Migraine variant without intractability Uncontrolled.Toradol and depo medrol administered IM in the office , improved generally, infrequent episodes which respond to baclofen,  uses topamax chronically  Alkaline phosphatase elevation Elevated alk phos unclear etiology, gI to eval  Nausea zofran in office , associated with headache and vertigo, tablets prescribed also , and work excuse to return 10/17/2016  Vertigo Acute onset, meclizine every 8 hrs , then as needed, work excuse x 2 days  Obesity (BMI 30.0-34.9) Unchanged Patient re-educated about  the importance of commitment to a  minimum of 150 minutes of exercise per week.  The importance of healthy food choices with portion control discussed. Encouraged to start a food diary, count calories and to consider  joining a support group. Sample diet sheets offered. Goals set by the patient for the next several months.   Weight /BMI 10/15/2016 09/03/2016 01/18/2016  WEIGHT 204 lb 207 lb 203 lb  HEIGHT 5' 6" 5' 6" 5' 6"  BMI 32.93 kg/m2 33.41 kg/m2 32.78 kg/m2      Prediabetes Patient educated about the importance of limiting  Carbohydrate intake , the need to commit to daily physical activity for a minimum of 30 minutes , and to commit weight loss. The fact that changes in all these areas will reduce or eliminate all together the development of diabetes is stressed.  Improved, which is good  Diabetic Labs Latest Ref Rng & Units 10/15/2016 07/19/2015 03/17/2015 09/28/2014 04/26/2014  HbA1c <5.7 % 5.8(H) 5.9(H) 6.1(H) 6.0(H) 6.1(H)  Chol <200 mg/dL 133 - 137 - -  HDL >50 mg/dL 41(L) - 44(L) - -  Calc LDL <100 mg/dL 68 - 71 - -  Triglycerides <150 mg/dL 120 - 112 - -  Creatinine 0.50 - 1.05 mg/dL 0.97 0.86 - 0.93 0.89   BP/Weight 10/15/2016 09/03/2016 01/18/2016 10/07/2015 08/17/2015 0/24/0973 11/15/2990  Systolic BP 426 834 196 222 979 892 119  Diastolic BP 80 74 82 83 88 62 80  Wt. (Lbs) 204 207 203 180 201 185 194  BMI 32.93 33.41 32.78 28.19 32.46 29.87 31.78   No flowsheet data found.    Family hx of colon cancer Needs to be re evaluated by GI has not scheduled as requested by GI, encouraged to do  so  Tenosynovitis of right hand Uncontrolled and debiltating right hand pain, s/s consistent with tenosynovitis, ortho eval and manahgement

## 2016-10-25 ENCOUNTER — Emergency Department (HOSPITAL_COMMUNITY)
Admission: EM | Admit: 2016-10-25 | Discharge: 2016-10-25 | Disposition: A | Discharge status: Home / Self Care | Payer: BLUE CROSS/BLUE SHIELD | Source: Home / Self Care | Attending: Emergency Medicine | Admitting: Emergency Medicine

## 2016-10-25 ENCOUNTER — Encounter (HOSPITAL_COMMUNITY): Payer: Self-pay | Admitting: *Deleted

## 2016-10-25 ENCOUNTER — Encounter (HOSPITAL_COMMUNITY): Payer: Self-pay | Admitting: Emergency Medicine

## 2016-10-25 ENCOUNTER — Emergency Department (HOSPITAL_COMMUNITY)
Admission: EM | Admit: 2016-10-25 | Discharge: 2016-10-25 | Disposition: A | Discharge status: Home / Self Care | Payer: BLUE CROSS/BLUE SHIELD | Attending: Emergency Medicine | Admitting: Emergency Medicine

## 2016-10-25 ENCOUNTER — Emergency Department (HOSPITAL_COMMUNITY): Payer: BLUE CROSS/BLUE SHIELD

## 2016-10-25 DIAGNOSIS — Z79899 Other long term (current) drug therapy: Secondary | ICD-10-CM | POA: Insufficient documentation

## 2016-10-25 DIAGNOSIS — J00 Acute nasopharyngitis [common cold]: Secondary | ICD-10-CM | POA: Insufficient documentation

## 2016-10-25 DIAGNOSIS — I10 Essential (primary) hypertension: Secondary | ICD-10-CM | POA: Insufficient documentation

## 2016-10-25 DIAGNOSIS — J029 Acute pharyngitis, unspecified: Secondary | ICD-10-CM | POA: Insufficient documentation

## 2016-10-25 DIAGNOSIS — R509 Fever, unspecified: Secondary | ICD-10-CM | POA: Insufficient documentation

## 2016-10-25 DIAGNOSIS — R059 Cough, unspecified: Secondary | ICD-10-CM

## 2016-10-25 DIAGNOSIS — R05 Cough: Secondary | ICD-10-CM | POA: Insufficient documentation

## 2016-10-25 DIAGNOSIS — R110 Nausea: Secondary | ICD-10-CM | POA: Diagnosis not present

## 2016-10-25 DIAGNOSIS — R51 Headache: Secondary | ICD-10-CM | POA: Diagnosis not present

## 2016-10-25 DIAGNOSIS — R079 Chest pain, unspecified: Secondary | ICD-10-CM | POA: Diagnosis not present

## 2016-10-25 LAB — RAPID STREP SCREEN (MED CTR MEBANE ONLY): Streptococcus, Group A Screen (Direct): NEGATIVE

## 2016-10-25 MED ORDER — CHLORPHENIRAMINE-PHENYLEPHRINE 1-3.5 MG/ML PO LIQD: 0.7500 mL | Freq: Four times a day (QID) | ORAL | 0 refills | Status: DC | PRN

## 2016-10-25 MED ORDER — KETOROLAC TROMETHAMINE 30 MG/ML IJ SOLN
15.0000 mg | Freq: Once | INTRAMUSCULAR | Status: AC
  Administered 2016-10-25: 15 mg via INTRAMUSCULAR
  Filled 2016-10-25: qty 1

## 2016-10-25 MED ORDER — ACETAMINOPHEN 325 MG PO TABS
650.0000 mg | ORAL_TABLET | Freq: Once | ORAL | Status: AC
  Administered 2016-10-25: 650 mg via ORAL

## 2016-10-25 MED ORDER — ACETAMINOPHEN 325 MG PO TABS
ORAL_TABLET | ORAL | Status: AC
  Filled 2016-10-25: qty 2

## 2016-10-25 MED ORDER — BENZONATATE 100 MG PO CAPS: 200.0000 mg | ORAL_CAPSULE | Freq: Three times a day (TID) | ORAL | 0 refills | Status: DC | PRN

## 2016-10-25 NOTE — Discharge Instructions (Signed)
As discussed, your evaluation today has been largely reassuring.  But, it is important that you monitor your condition carefully, and do not hesitate to return to the ED if you develop new, or concerning changes in your condition.  In addition to the prescribed medication, acknowledging your allergic reaction to prior medications, it is important that you use alternatives, such as honey, and lozenges for additional control of your sore throat.  Be sure to follow-up with your physician in 3 days.  However, if new, or concerning changes occur, be sure to return here for additional evaluation.

## 2016-10-25 NOTE — ED Triage Notes (Signed)
Pt states fever, cough, sore throat since Tuesday.  Was seen at [facility] and they did not perform a strep test.  They also gave her prescriptions that the pharmacy didn't have.

## 2016-10-25 NOTE — Discharge Instructions (Signed)
Begin taking Tessalon perles three times daily as needed for cough. Continue supportive measuring including Tylenol, ibuprofen, Flonase and Sudafed. Follow up with PCP for further evaluation. Return to ED for worsening cough, coughing up blood, persistent fevers, chest pain or trouble breathing.

## 2016-10-25 NOTE — ED Triage Notes (Signed)
Pt reports sore throat, nausea, headache x 3 days, a/o, no acute distress noted

## 2016-10-25 NOTE — ED Provider Notes (Signed)
[facility] DEPT Provider Note   CSN: [id number] Arrival date & time: 10/25/16  1228   By signing my name below, I, Latoya Decker, attest that this documentation has been prepared under the direction and in the presence of [name], PA-C. Electronically Signed: Norris Decker , ED Scribe. 10/25/16. 4:04 PM.   History   Chief Complaint Chief Complaint  Patient presents with  . URI    HPI Latoya Decker is a 56 y.o. female with hx of migraines who presents to the Emergency Department complaining of constant headache and sore throat with onset x3 days. Pt states that [place] she became ill with sore throat and headache x3 days ago. She reports headache, sore throat, productive cough (yellow sputum), subjective tactile fever, chills. Pt has taken Tylenol, Ibuprofen and Topamax with no relief. She denies ear pain, myalgias, chest pain, SOB. Pt denies seasonal allergies, sick contacts at home. Of note, pt was seen at [facility] ED earlier today and had chest X-rays done.  The history is provided by the patient. No language interpreter was used.    Past Medical History:  Diagnosis Date  . Anxiety   . Depression   . Duodenal ulcer 01/05/2010   EGD Dr Dierdre Searles, h pylori gastritis, completed treatment  . Duodenitis   . Eosinophilic gastroenteritis JAN 2015   [place] 2014: NL CMP/GIARDIA Ag  . Family hx of colon cancer    Mother & sister  . Gastritis    hospitalized 6/23-6/25 2011   . Headache(784.0)    migraines  . Helicobacter pylori gastritis   . Hypertension   . PUD (peptic ulcer disease)    Dr Tamala Julian  . S/P colonoscopy 12/27/2003   Dr Voncille Lo  . S/P endoscopy 12/27/2003   Dr Boston Service superficial ulcers GEJ, duodenitis,gastritis    Patient Active Problem List   Diagnosis Date Noted  . Tenosynovitis of right hand 10/20/2016  . Alkaline phosphatase elevation 10/16/2016  . Vertigo 10/15/2016  . Nausea 10/15/2016  . Prediabetes  05/01/2014  . Allergic rhinitis 11/24/2013  . Insomnia 07/01/2013  . Migraine variant without intractability 08/31/2012  . Family hx of colon cancer 05/14/2011  . Duodenal ulcer without hemorrhage or perforation and without obstruction 01/12/2010  . Obesity (BMI 30.0-34.9) 03/30/2008    Past Surgical History:  Procedure Laterality Date  . ABDOMINAL HYSTERECTOMY    . bilateral tubal ligation  1989  . BIOPSY  06/10/2011   SLF: mild gastritis, bx benign, sb bx benign   . CHOLECYSTECTOMY  1997   aph-Smith  . COLONOSCOPY  05/2011   SLF: multiple hyperplastic sessile polyps, scattered diverticulosis. next TCS 05/2016 ([facility])  . ESOPHAGOGASTRODUODENOSCOPY N/A 2/99/3716   EOS INOPHILIC GASTROENTERITIS  . PARTIAL HYSTERECTOMY  2007   1 ovary removed  . POLYPECTOMY  06/10/2011   Procedure: POLYPECTOMY;  Surgeon: Dorothyann Peng, MD;  Location: [facility];  Service: Endoscopy;;  Cecal polypectomy; Sigmoid colon polypectomies  . TUBAL LIGATION      OB History    Gravida Para Term Preterm AB Living   3         3   SAB TAB Ectopic Multiple Live Births                   Home Medications    Prior to Admission medications   Medication Sig Start Date End Date Taking? Authorizing Provider  baclofen (LIORESAL) 10 MG tablet One tablet twice daily if needed for  migraine headache. Maximum use is twice monthly 10/15/16   Fayrene Helper, MD  benzonatate (TESSALON) 100 MG capsule Take 2 capsules (200 mg total) by mouth 3 (three) times daily as needed for cough. 10/25/16   Tashea Othman, PA-C  chlorpheniramine-phenylephrine (CARDEC) 1-3.5 MG/ML LIQD Take 0.75 mLs by mouth 4 (four) times daily as needed. 10/25/16   Carmin Muskrat, MD  fluticasone (FLONASE) 50 MCG/ACT nasal spray Place 2 sprays into both nostrils daily. 09/03/16   Raylene Everts, MD  meclizine (ANTIVERT) 25 MG tablet Take 1 tablet (25 mg total) by mouth 3 (three) times daily as needed for dizziness. 10/15/16   Fayrene Helper, MD    ondansetron (ZOFRAN) 4 MG tablet Take 1 tablet (4 mg total) by mouth every 8 (eight) hours as needed for nausea or vomiting. 10/15/16   Fayrene Helper, MD  topiramate (TOPAMAX) 25 MG capsule Take 100 mg by mouth at bedtime.    Historical Provider, MD  traZODone (DESYREL) 50 MG tablet Take 1 tablet (50 mg total) by mouth at bedtime as needed for sleep. 07/19/15   Fayrene Helper, MD    Family History Family History  Problem Relation Age of Onset  . Colon cancer Mother 18  . Diabetes Mother   . Hypertension Mother   . Stroke Mother   . Heart attack Father   . Diabetes Father   . Hypertension Father   . Colon cancer Sister 98  . Lupus Brother   . Parkinsonism Brother   . Anesthesia problems Neg Hx   . Hypotension Neg Hx   . Malignant hyperthermia Neg Hx   . Pseudochol deficiency Neg Hx     Social History Social History  Substance Use Topics  . Smoking status: Never Smoker  . Smokeless tobacco: Never Used     Comment: Never smoker  . Alcohol use No     Allergies   Codeine; Hydrocodone; Lidocaine viscous; and Nsaids   Review of Systems Review of Systems  Constitutional: Positive for chills and fever.  HENT: Positive for sore throat. Negative for ear pain.   Respiratory: Positive for cough. Negative for shortness of breath.   Cardiovascular: Negative for chest pain.  Gastrointestinal: Negative for nausea and vomiting.  Musculoskeletal: Negative for myalgias.  Neurological: Positive for headaches.     Physical Exam Updated Vital Signs BP (!) 147/100 (BP Location: Left Arm)   Pulse 85   Temp 99 F (37.2 C) (Oral)   Resp 16   Ht 5\' 6"  (1.676 m)   Wt 86.2 kg   SpO2 99%   BMI 30.67 kg/m   Physical Exam  Constitutional: She appears well-developed and well-nourished. No distress.  HENT:  Head: Normocephalic and atraumatic.  Right Ear: Tympanic membrane is not injected, not perforated and not erythematous. A middle ear effusion is present.  Left Ear:  Tympanic membrane normal.  Nose: Mucosal edema present. Right sinus exhibits no maxillary sinus tenderness and no frontal sinus tenderness. Left sinus exhibits no maxillary sinus tenderness and no frontal sinus tenderness.  Mouth/Throat: Uvula is midline and oropharynx is clear and moist. No oropharyngeal exudate or posterior oropharyngeal erythema. No tonsillar exudate.  Eyes: Conjunctivae are normal.  Neck: Neck supple.  Cardiovascular: Normal rate and regular rhythm.   No murmur heard. Pulmonary/Chest: Effort normal and breath sounds normal. No respiratory distress.  Abdominal: Soft. There is no tenderness.  Musculoskeletal: She exhibits no edema.  Neurological: She is alert.  Skin: Skin is warm and dry.  Psychiatric: She has a normal mood and affect.  Nursing note and vitals reviewed.    ED Treatments / Results   DIAGNOSTIC STUDIES: Oxygen Saturation is 99% on RA, normal by my interpretation.   COORDINATION OF CARE: 4:04 PM-Discussed next steps with pt. Pt verbalized understanding and is agreeable with the plan.    Labs (all labs ordered are listed, but only abnormal results are displayed) Labs Reviewed  RAPID STREP SCREEN (NOT AT [facility]  CULTURE, GROUP A STREP [facility])    EKG  EKG Interpretation None       Radiology Dg Chest 2 View  Result Date: 10/25/2016 CLINICAL DATA:  Productive cough.  Chest pain. EXAM: CHEST  2 VIEW COMPARISON:  None. FINDINGS: The heart size and mediastinal contours are within normal limits. Both lungs are clear. No pneumothorax or pleural effusion is noted. The visualized skeletal structures are unremarkable. IMPRESSION: No active cardiopulmonary disease. Electronically Signed   By: Marijo Conception, M.D.   On: 10/25/2016 10:16    Procedures Procedures (including critical care time)  Medications Ordered in ED Medications  acetaminophen (TYLENOL) tablet 650 mg (650 mg Oral Given 10/25/16 1322)     Initial Impression / Assessment and  Plan / ED Course  I have reviewed the triage vital signs and the nursing notes.  Pertinent labs & imaging results that were available during my care of the patient were reviewed by me and considered in my medical decision making (see chart for details).     Patient was seen earlier today at [facility] ED with negative CXR and rx for Sudafed. She states that she was unable to get it filled because pharmacist was unsure of what it was.  This appears to be all viral. No signs of strep throat and strep test was negative and sent for culture. She reports her fever have "not been that high" although subjective. She reports recent travel but not out of the country. Symptoms began 3 days ago, so out of Tamiflu window, although this does not appear to be influenza. Will treat symptomatically with Tessalon perles rx. Continue OTC symptomatic relief including ibuprofen and Sudafed. Patient states she has flonase at home and will use it. Return precautions given.  Final Clinical Impressions(s) / ED Diagnoses   Final diagnoses:  Acute nasopharyngitis    New Prescriptions Discharge Medication List as of 10/25/2016  2:47 PM    START taking these medications   Details  benzonatate (TESSALON) 100 MG capsule Take 2 capsules (200 mg total) by mouth 3 (three) times daily as needed for cough., Starting Fri 10/25/2016, Print       I personally performed the services described in this documentation, which was scribed in my presence. The recorded information has been reviewed and is accurate.     Delia Heady, PA-C 10/25/16 [place], MD 10/26/16 [phone]

## 2016-10-25 NOTE — ED Provider Notes (Signed)
[facility] [place] DEPT Provider Note   CSN: [id number] Arrival date & time: 10/25/16  0907  By signing my name below, I, Higinio Plan, attest that this documentation has been prepared under the direction and in the presence of Carmin Muskrat, MD . Electronically Signed: Higinio Plan, Scribe. 10/25/2016. 9:37 AM.  History   Chief Complaint Chief Complaint  Patient presents with  . Sore Throat    x 3 days  . Headache  . Nausea   The history is provided by the patient. No language interpreter was used.   HPI Comments: Latoya Decker is a 56 y.o. female with PMHx of HTN and migraines, who presents to the Emergency Department complaining of gradually worsening, sore throat that began 3 days ago. Pt reports associated nausea, headache, cough, subjective fever, and chills. She states she has not taken any medication to relieve her symptoms. She denies any vomiting or diarrhea.    Past Medical History:  Diagnosis Date  . Anxiety   . Depression   . Duodenal ulcer 01/05/2010   EGD Dr Dierdre Searles, h pylori gastritis, completed treatment  . Duodenitis   . Eosinophilic gastroenteritis JAN 2015   [place] 2014: NL CMP/GIARDIA Ag  . Family hx of colon cancer    Mother & sister  . Gastritis    hospitalized 6/23-6/25 2011   . Headache(784.0)    migraines  . Helicobacter pylori gastritis   . Hypertension   . PUD (peptic ulcer disease)    Dr Tamala Julian  . S/P colonoscopy 12/27/2003   Dr Voncille Lo  . S/P endoscopy 12/27/2003   Dr [place] Service superficial ulcers GEJ, duodenitis,gastritis    Patient Active Problem List   Diagnosis Date Noted  . Tenosynovitis of right hand 10/20/2016  . Alkaline phosphatase elevation 10/16/2016  . Vertigo 10/15/2016  . Nausea 10/15/2016  . Prediabetes 05/01/2014  . Allergic rhinitis 11/24/2013  . Insomnia 07/01/2013  . Migraine variant without intractability 08/31/2012  . Family hx of colon cancer 05/14/2011  . Duodenal ulcer without hemorrhage or perforation and  without obstruction 01/12/2010  . Obesity (BMI 30.0-34.9) 03/30/2008    Past Surgical History:  Procedure Laterality Date  . ABDOMINAL HYSTERECTOMY    . bilateral tubal ligation  1989  . BIOPSY  06/10/2011   SLF: mild gastritis, bx benign, sb bx benign   . CHOLECYSTECTOMY  1997   aph-Smith  . COLONOSCOPY  05/2011   SLF: multiple hyperplastic sessile polyps, scattered diverticulosis. next TCS 05/2016 ([facility])  . ESOPHAGOGASTRODUODENOSCOPY N/A 10/21/8117   EOS INOPHILIC GASTROENTERITIS  . PARTIAL HYSTERECTOMY  2007   1 ovary removed  . POLYPECTOMY  06/10/2011   Procedure: POLYPECTOMY;  Surgeon: Dorothyann Peng, MD;  Location: [facility];  Service: Endoscopy;;  Cecal polypectomy; Sigmoid colon polypectomies  . TUBAL LIGATION      OB History    Gravida Para Term Preterm AB Living   3         3   SAB TAB Ectopic Multiple Live Births                 Home Medications    Prior to Admission medications   Medication Sig Start Date End Date Taking? Authorizing Provider  baclofen (LIORESAL) 10 MG tablet One tablet twice daily if needed for migraine headache. Maximum use is twice monthly 10/15/16   Fayrene Helper, MD  fluticasone [facility]) 50 MCG/ACT nasal spray Place 2 sprays into both nostrils daily. 09/03/16   Raylene Everts, MD  meclizine [facility])  25 MG tablet Take 1 tablet (25 mg total) by mouth 3 (three) times daily as needed for dizziness. 10/15/16   Fayrene Helper, MD  ondansetron (ZOFRAN) 4 MG tablet Take 1 tablet (4 mg total) by mouth every 8 (eight) hours as needed for nausea or vomiting. 10/15/16   Fayrene Helper, MD  topiramate (TOPAMAX) 25 MG capsule Take 100 mg by mouth at bedtime.    Historical Provider, MD  traZODone (DESYREL) 50 MG tablet Take 1 tablet (50 mg total) by mouth at bedtime as needed for sleep. 07/19/15   Fayrene Helper, MD    Family History Family History  Problem Relation Age of Onset  . Colon cancer Mother 3  . Diabetes Mother   .  Hypertension Mother   . Stroke Mother   . Heart attack Father   . Diabetes Father   . Hypertension Father   . Colon cancer Sister 42  . Lupus Brother   . Parkinsonism Brother   . Anesthesia problems Neg Hx   . Hypotension Neg Hx   . Malignant hyperthermia Neg Hx   . Pseudochol deficiency Neg Hx     Social History Social History  Substance Use Topics  . Smoking status: Never Smoker  . Smokeless tobacco: Never Used     Comment: Never smoker  . Alcohol use No   Allergies   Codeine; Hydrocodone; Lidocaine viscous; and Nsaids  Review of Systems Review of Systems  Constitutional: Positive for chills and fever.  HENT: Positive for sore throat.   Eyes: Negative.   Respiratory: Positive for cough.   Gastrointestinal: Positive for nausea. Negative for diarrhea and vomiting.  Genitourinary: Negative.   Musculoskeletal: Negative.   Skin: Negative.   Allergic/Immunologic: Negative for immunocompromised state.  Neurological: Positive for headaches.   Physical Exam Updated Vital Signs BP 140/88 (BP Location: Left Arm)   Pulse 88   Temp 98.2 F (36.8 C) (Oral)   Resp 16   Ht 5\' 6"  (1.676 m)   Wt 190 lb (86.2 kg)   SpO2 98%   BMI 30.67 kg/m   Physical Exam  Constitutional: She is oriented to person, place, and time. She appears well-developed and well-nourished. No distress.  HENT:  Head: Normocephalic and atraumatic.  Minimal posterior oral pharyngeal erythema.  Eyes: Conjunctivae and EOM are normal.  Cardiovascular: Normal rate and regular rhythm.   Pulmonary/Chest: Effort normal and breath sounds normal. No stridor. No respiratory distress.  Abdominal: She exhibits no distension.  Musculoskeletal: She exhibits no edema.  Neurological: She is alert and oriented to person, place, and time. No cranial nerve deficit.  Skin: Skin is warm and dry.  Psychiatric: She has a normal mood and affect.  Nursing note and vitals reviewed.   ED Treatments / Results  DIAGNOSTIC  STUDIES:  Oxygen Saturation is 98% on RA, normal by my interpretation.    COORDINATION OF CARE:  9:30 AM Discussed treatment plan with pt at bedside and pt agreed to plan.  Radiology Dg Chest 2 View  Result Date: 10/25/2016 CLINICAL DATA:  Productive cough.  Chest pain. EXAM: CHEST  2 VIEW COMPARISON:  None. FINDINGS: The heart size and mediastinal contours are within normal limits. Both lungs are clear. No pneumothorax or pleural effusion is noted. The visualized skeletal structures are unremarkable. IMPRESSION: No active cardiopulmonary disease. Electronically Signed   By: Marijo Conception, M.D.   On: 10/25/2016 10:16   Procedures Procedures (including critical care time)  Medications Ordered in ED  Medications - No data to display  Initial Impression / Assessment and Plan / ED Course  I have reviewed the triage vital signs and the nursing notes.  Pertinent labs & imaging results that were available during my care of the patient were reviewed by me and considered in my medical decision making (see chart for details).  I personally performed the services described in this documentation, which was scribed in my presence. The recorded information has been reviewed and is accurate.     Final Clinical Impressions(s) / ED Diagnoses  On repeat exam patient is awake and alert, in no distress. X-ray reviewed with her, vital signs, illness discussed again. With concern for multiple ongoing areas of discomfort, but no evidence for pneumonia, strep throat, and no chest pain, little suspicion for bacteremia, sepsis. Patient appropriate for further evaluation, management as an outpatient.    Sore throat Cough    Carmin Muskrat, MD 10/25/16 1110

## 2016-10-25 NOTE — ED Notes (Signed)
Papers reviewed with patient and she verbalizes understanding. Denies pain leaving alone today

## 2016-10-27 LAB — CULTURE, GROUP A STREP (THRC)

## 2016-10-28 ENCOUNTER — Telehealth: Payer: Self-pay | Admitting: Family Medicine

## 2016-10-28 NOTE — Telephone Encounter (Signed)
Patient has had a sore throat since last tue, she states she is having a hard time breathing & eating. Went to [employer] & [place] last week says either Thursday or Friday. cb#: [phone]

## 2016-10-29 ENCOUNTER — Telehealth: Payer: Self-pay | Admitting: Family Medicine

## 2016-10-29 NOTE — Telephone Encounter (Signed)
Called to schedule appt for sore throat with dr.nelson, ok per brandi no answer, left vm.

## 2016-10-29 NOTE — Telephone Encounter (Signed)
Can see dr Meda Coffee as a same day appt

## 2016-10-30 ENCOUNTER — Ambulatory Visit (INDEPENDENT_AMBULATORY_CARE_PROVIDER_SITE_OTHER): Payer: BLUE CROSS/BLUE SHIELD | Admitting: Family Medicine

## 2016-10-30 ENCOUNTER — Encounter: Payer: Self-pay | Admitting: Family Medicine

## 2016-10-30 VITALS — BP 120/82 | HR 84 | Temp 97.9°F | Resp 16 | Ht 66.0 in | Wt 203.1 lb

## 2016-10-30 DIAGNOSIS — J029 Acute pharyngitis, unspecified: Secondary | ICD-10-CM

## 2016-10-30 LAB — POCT RAPID STREP A (OFFICE): Rapid Strep A Screen: NEGATIVE

## 2016-10-30 MED ORDER — IBUPROFEN 800 MG PO TABS: 800.0000 mg | ORAL_TABLET | Freq: Three times a day (TID) | ORAL | 0 refills | Status: DC | PRN

## 2016-10-30 MED ORDER — AMOXICILLIN 875 MG PO TABS: 875.0000 mg | ORAL_TABLET | Freq: Two times a day (BID) | ORAL | 1 refills | Status: DC

## 2016-10-30 NOTE — Progress Notes (Signed)
Chief Complaint  Patient presents with  . Sore Throat    x 1 week   Sore throat 8 days Has been to ER two times Strep test negative Still can hardly swallow and can eat only soft foods. Mild sinus drainage and headache Moderate coughing with yellow sputum Treated conservatively with tylenol. Salt water gargles, mucinex and tessalon  Patient Active Problem List   Diagnosis Date Noted  . Tenosynovitis of right hand 10/20/2016  . Alkaline phosphatase elevation 10/16/2016  . Vertigo 10/15/2016  . Nausea 10/15/2016  . Prediabetes 05/01/2014  . Allergic rhinitis 11/24/2013  . Insomnia 07/01/2013  . Migraine variant without intractability 08/31/2012  . Family hx of colon cancer 05/14/2011  . Duodenal ulcer without hemorrhage or perforation and without obstruction 01/12/2010  . Obesity (BMI 30.0-34.9) 03/30/2008    Outpatient Encounter Prescriptions as of 10/30/2016  Medication Sig  . baclofen (LIORESAL) 10 MG tablet One tablet twice daily if needed for migraine headache. Maximum use is twice monthly  . benzonatate (TESSALON) 100 MG capsule Take 2 capsules (200 mg total) by mouth 3 (three) times daily as needed for cough.  . fluticasone (FLONASE) 50 MCG/ACT nasal spray Place 2 sprays into both nostrils daily.  . meclizine (ANTIVERT) 25 MG tablet Take 1 tablet (25 mg total) by mouth 3 (three) times daily as needed for dizziness.  . ondansetron (ZOFRAN) 4 MG tablet Take 1 tablet (4 mg total) by mouth every 8 (eight) hours as needed for nausea or vomiting.  . topiramate (TOPAMAX) 25 MG capsule Take 100 mg by mouth at bedtime.  . traZODone (DESYREL) 50 MG tablet Take 1 tablet (50 mg total) by mouth at bedtime as needed for sleep.  Marland Kitchen amoxicillin (AMOXIL) 875 MG tablet Take 1 tablet (875 mg total) by mouth 2 (two) times daily.  Marland Kitchen ibuprofen (ADVIL,MOTRIN) 800 MG tablet Take 1 tablet (800 mg total) by mouth every 8 (eight) hours as needed.   No facility-administered encounter  medications on file as of 10/30/2016.     Allergies  Allergen Reactions  . Codeine Nausea And Vomiting  . Hydrocodone Nausea And Vomiting  . Lidocaine Viscous     Couldn't think straight    Review of Systems  Constitutional: Positive for activity change, appetite change, chills, fatigue and fever.  HENT: Positive for congestion, postnasal drip, rhinorrhea, sore throat and trouble swallowing.   Eyes: Negative for redness and visual disturbance.  Respiratory: Positive for cough. Negative for shortness of breath and wheezing.   Cardiovascular: Negative for chest pain and palpitations.  Gastrointestinal: Negative for nausea and vomiting.  Musculoskeletal: Negative for arthralgias, neck pain and neck stiffness.  Neurological: Positive for headaches. Negative for dizziness.  Psychiatric/Behavioral: Positive for sleep disturbance.    BP 120/82 (BP Location: Right Arm, Patient Position: Sitting, Cuff Size: Normal)   Pulse 84   Temp 97.9 F (36.6 C) (Temporal)   Resp 16   Ht 5\' 6"  (1.676 m)   Wt 203 lb 1.9 oz (92.1 kg)   SpO2 97%   BMI 32.78 kg/m   Physical Exam  Constitutional: She is oriented to person, place, and time. She appears well-developed and well-nourished. No distress.  fatigued  HENT:  Head: Normocephalic and atraumatic.  Right Ear: External ear normal.  Left Ear: External ear normal.  Tonsils mild hypertrophy with redness/no exudate or asymmetry.  Eyes: Conjunctivae are normal. Pupils are equal, round, and reactive to light.  Cardiovascular: Normal rate, regular rhythm and normal heart sounds.  Pulmonary/Chest: Effort normal and breath sounds normal. No respiratory distress. She has no wheezes. She has no rales.  Abdominal: Soft. Bowel sounds are normal.  Lymphadenopathy:    She has no cervical adenopathy.  Neurological: She is alert and oriented to person, place, and time.  Psychiatric: She has a normal mood and affect. Her behavior is normal. Thought content  normal.    ASSESSMENT/PLAN:  1. Sore throat  - POCT rapid strep A = NEGATVE Discussed viral illness Need to avoid antibiotics for mild illness BUT her sore throat is significant and persistent.  Will give Rx for amoxil.  Patient Instructions  Push fluids Take thea amoxicillin as directed Take the ibuprofen as needed with food Call if not better by monday   Raylene Everts, MD

## 2016-10-30 NOTE — Patient Instructions (Signed)
Push fluids Take thea amoxicillin as directed Take the ibuprofen as needed with food Call if not better by monday

## 2016-11-19 DIAGNOSIS — R51 Headache: Secondary | ICD-10-CM | POA: Diagnosis not present

## 2016-11-19 DIAGNOSIS — M542 Cervicalgia: Secondary | ICD-10-CM | POA: Diagnosis not present

## 2016-11-19 DIAGNOSIS — G518 Other disorders of facial nerve: Secondary | ICD-10-CM | POA: Diagnosis not present

## 2016-11-19 DIAGNOSIS — M791 Myalgia: Secondary | ICD-10-CM | POA: Diagnosis not present

## 2016-11-19 DIAGNOSIS — G43019 Migraine without aura, intractable, without status migrainosus: Secondary | ICD-10-CM | POA: Diagnosis not present

## 2016-12-03 ENCOUNTER — Encounter: Payer: Self-pay | Admitting: Orthopaedic Surgery

## 2016-12-03 ENCOUNTER — Ambulatory Visit (INDEPENDENT_AMBULATORY_CARE_PROVIDER_SITE_OTHER): Payer: BLUE CROSS/BLUE SHIELD

## 2016-12-03 ENCOUNTER — Ambulatory Visit (INDEPENDENT_AMBULATORY_CARE_PROVIDER_SITE_OTHER): Payer: BLUE CROSS/BLUE SHIELD | Admitting: Orthopaedic Surgery

## 2016-12-03 VITALS — BP 130/80 | HR 77 | Temp 97.5°F | Ht 65.0 in | Wt 201.0 lb

## 2016-12-03 DIAGNOSIS — M25531 Pain in right wrist: Secondary | ICD-10-CM

## 2016-12-03 DIAGNOSIS — G8929 Other chronic pain: Secondary | ICD-10-CM

## 2016-12-03 NOTE — Progress Notes (Signed)
Subjective:    Patient ID: Latoya Decker, female    DOB: 08/18/1960, 56 y.o.   MRN: [id number]  HPI She has had pain in the right wrist for several months.  She has no direct trauma.  She has history in past of possible carpal tunnel syndrome.  She has no redness.  She has had some swelling.  She cannot take any NSAIDs.  She has used ice.  She has no nocturnal numbness. 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  She has seen Dr. Moshe Cipro recently and has asked to be seen here today.  She has no shoulder or neck pain.   Review of Systems  HENT: Negative for congestion.   Respiratory: Negative for cough and shortness of breath.   Cardiovascular: Negative for chest pain and leg swelling.  Endocrine: Negative for cold intolerance.  Musculoskeletal: Positive for arthralgias.  Allergic/Immunologic: Negative for environmental allergies.  Neurological: Positive for headaches.   Past Medical History:  Diagnosis Date  . Anxiety   . Depression   . Duodenal ulcer 01/05/2010   EGD Dr Dierdre Searles, h pylori gastritis, completed treatment  . Duodenitis   . Eosinophilic gastroenteritis JAN 2015   [place] 2014: NL CMP/GIARDIA Ag  . Family hx of colon cancer    Mother & sister  . Gastritis    hospitalized 6/23-6/25 2011   . Headache(784.0)    migraines  . Helicobacter pylori gastritis   . Hypertension   . PUD (peptic ulcer disease)    Dr Tamala Julian  . S/P colonoscopy 12/27/2003   Dr Voncille Lo  . S/P endoscopy 12/27/2003   Dr [facility] Service superficial ulcers GEJ, duodenitis,gastritis    Past Surgical History:  Procedure Laterality Date  . ABDOMINAL HYSTERECTOMY    . bilateral tubal ligation  1989  . BIOPSY  06/10/2011   SLF: mild gastritis, bx benign, sb bx benign   . CHOLECYSTECTOMY  1997   aph-Smith  . COLONOSCOPY  05/2011   SLF: multiple hyperplastic sessile polyps, scattered diverticulosis. next TCS 05/2016 ([facility])  .  ESOPHAGOGASTRODUODENOSCOPY N/A 2/45/8099   EOS INOPHILIC GASTROENTERITIS  . PARTIAL HYSTERECTOMY  2007   1 ovary removed  . POLYPECTOMY  06/10/2011   Procedure: POLYPECTOMY;  Surgeon: Dorothyann Peng, MD;  Location: [facility];  Service: Endoscopy;;  Cecal polypectomy; Sigmoid colon polypectomies  . TUBAL LIGATION      Current Outpatient Prescriptions on File Prior to Visit  Medication Sig Dispense Refill  . amoxicillin (AMOXIL) 875 MG tablet Take 1 tablet (875 mg total) by mouth 2 (two) times daily. 10 tablet 1  . baclofen (LIORESAL) 10 MG tablet One tablet twice daily if needed for migraine headache. Maximum use is twice monthly 30 each 1  . benzonatate (TESSALON) 100 MG capsule Take 2 capsules (200 mg total) by mouth 3 (three) times daily as needed for cough. 21 capsule 0  . fluticasone (FLONASE) 50 MCG/ACT nasal spray Place 2 sprays into both nostrils daily. 48 g 1  . ibuprofen (ADVIL,MOTRIN) 800 MG tablet Take 1 tablet (800 mg total) by mouth every 8 (eight) hours as needed. 30 tablet 0  . meclizine (ANTIVERT) 25 MG tablet Take 1 tablet (25 mg total) by mouth 3 (three) times daily as needed for dizziness. 30 tablet 0  . ondansetron (ZOFRAN) 4 MG tablet Take 1 tablet (4 mg total) by mouth every 8 (eight) hours as needed for nausea or vomiting. 20 tablet 0  .  topiramate (TOPAMAX) 25 MG capsule Take 100 mg by mouth at bedtime.    . traZODone (DESYREL) 50 MG tablet Take 1 tablet (50 mg total) by mouth at bedtime as needed for sleep. 30 tablet 4   No current facility-administered medications on file prior to visit.     Social History   Social History  . Marital status: Married    Spouse name: [name]  . Number of children: 3  . Years of education: College   Occupational History  . Dialysis [employer]  .  [employer]   Social History Main Topics  . Smoking status: Never Smoker  . Smokeless tobacco: Never Used     Comment: Never smoker  . Alcohol use No  . Drug use: No  .  Sexual activity: Yes    Partners: Male    Birth control/ protection: Surgical   Other Topics Concern  . Not on file   Social History Narrative   Patient is married [name]) and lives at home with her husband and her granddaughter.   Patient has three adult children.   Patient works full-time.   Patient has a college education.   Patient is right-handed.   Patient drinks one cup of tea daily.    Family History  Problem Relation Age of Onset  . Colon cancer Mother 76  . Diabetes Mother   . Hypertension Mother   . Stroke Mother   . Heart attack Father   . Diabetes Father   . Hypertension Father   . Colon cancer Sister 41  . Lupus Brother   . Parkinsonism Brother   . Anesthesia problems Neg Hx   . Hypotension Neg Hx   . Malignant hyperthermia Neg Hx   . Pseudochol deficiency Neg Hx     BP 130/80   Pulse 77   Temp 97.5 F (36.4 C)   Ht 5\' 5"  (1.651 m)   Wt 201 lb (91.2 kg)   BMI 33.45 kg/m      Objective:   Physical Exam  Constitutional: She is oriented to person, place, and time. She appears well-developed and well-nourished.  HENT:  Head: Normocephalic and atraumatic.  Eyes: Conjunctivae and EOM are normal. Pupils are equal, round, and reactive to light.  Neck: Normal range of motion. Neck supple.  Cardiovascular: Normal rate, regular rhythm and intact distal pulses.   Pulmonary/Chest: Effort normal.  Abdominal: Soft.  Musculoskeletal: She exhibits tenderness (Right wrist is tender but has full motion, no redness, no swelling.  Grip and NV intact.  No one area of tenderness.  ROM left negative.).  Neurological: She is alert and oriented to person, place, and time. She displays normal reflexes. No cranial nerve deficit. She exhibits normal muscle tone. Coordination normal.  Skin: Skin is warm and dry.  Psychiatric: She has a normal mood and affect. Her behavior is normal. Judgment and thought content normal.  Vitals reviewed.    X-rays of the right wrist were  done and reported separately.     Assessment & Plan:   Encounter Diagnosis  Name Primary?  . Wrist pain, chronic, right Yes   I have told her to begin Aspercreme.  I have fitted her with a cock-up splint.  Return in two weeks.  Call if any problem.  Electronically Signed Sanjuana Kava, MD 5/22/20182:30 PM

## 2016-12-17 ENCOUNTER — Ambulatory Visit: Payer: BLUE CROSS/BLUE SHIELD | Admitting: Orthopaedic Surgery

## 2017-01-17 ENCOUNTER — Telehealth: Payer: Self-pay | Admitting: *Deleted

## 2017-01-17 ENCOUNTER — Ambulatory Visit (INDEPENDENT_AMBULATORY_CARE_PROVIDER_SITE_OTHER): Payer: BLUE CROSS/BLUE SHIELD

## 2017-01-17 DIAGNOSIS — J029 Acute pharyngitis, unspecified: Secondary | ICD-10-CM

## 2017-01-17 LAB — POCT RAPID STREP A (OFFICE): Rapid Strep A Screen: NEGATIVE

## 2017-01-17 NOTE — Telephone Encounter (Signed)
I saw her for sore throat in April- persistent.  She may need ENT visit if this becomes a recurrent problem.  In general for sore throat, salt water gargles, chloraseptic spray or lozenges, tylenol or ibuprofen for pain.  IF she feels it is strep test, she can come for a strep test.

## 2017-01-17 NOTE — Telephone Encounter (Signed)
Patient called stating she is still having a sore throat, per patient it is getting worse. Patient states she has had a sore throat since Monday. Please advise

## 2017-01-17 NOTE — Telephone Encounter (Signed)
Patient will be coming in for strep test later today. Has been taking OTC pain medication since Monday, has not seen ENT, but has been informed that if this becomes a recurring problem, to go see ENT

## 2017-01-17 NOTE — Telephone Encounter (Signed)
Do you have any recommendations?

## 2017-01-20 ENCOUNTER — Telehealth: Payer: Self-pay | Admitting: Family Medicine

## 2017-01-20 ENCOUNTER — Encounter: Payer: Self-pay | Admitting: Gastroenterology

## 2017-01-20 ENCOUNTER — Other Ambulatory Visit: Payer: Self-pay | Admitting: Family Medicine

## 2017-01-20 ENCOUNTER — Ambulatory Visit (INDEPENDENT_AMBULATORY_CARE_PROVIDER_SITE_OTHER): Payer: BLUE CROSS/BLUE SHIELD | Admitting: Family Medicine

## 2017-01-20 ENCOUNTER — Encounter: Payer: Self-pay | Admitting: Family Medicine

## 2017-01-20 VITALS — BP 114/68 | HR 78 | Temp 96.9°F | Resp 16 | Ht 65.0 in | Wt 197.1 lb

## 2017-01-20 DIAGNOSIS — R1310 Dysphagia, unspecified: Secondary | ICD-10-CM

## 2017-01-20 DIAGNOSIS — G43809 Other migraine, not intractable, without status migrainosus: Secondary | ICD-10-CM

## 2017-01-20 DIAGNOSIS — R1013 Epigastric pain: Secondary | ICD-10-CM | POA: Diagnosis not present

## 2017-01-20 DIAGNOSIS — E049 Nontoxic goiter, unspecified: Secondary | ICD-10-CM | POA: Diagnosis not present

## 2017-01-20 DIAGNOSIS — Z800 Family history of malignant neoplasm of digestive organs: Secondary | ICD-10-CM | POA: Diagnosis not present

## 2017-01-20 DIAGNOSIS — J029 Acute pharyngitis, unspecified: Secondary | ICD-10-CM | POA: Diagnosis not present

## 2017-01-20 DIAGNOSIS — Z1211 Encounter for screening for malignant neoplasm of colon: Secondary | ICD-10-CM

## 2017-01-20 DIAGNOSIS — E669 Obesity, unspecified: Secondary | ICD-10-CM

## 2017-01-20 DIAGNOSIS — K219 Gastro-esophageal reflux disease without esophagitis: Secondary | ICD-10-CM | POA: Diagnosis not present

## 2017-01-20 MED ORDER — IBUPROFEN 800 MG PO TABS: 800.0000 mg | ORAL_TABLET | Freq: Three times a day (TID) | ORAL | 1 refills | Status: DC | PRN

## 2017-01-20 MED ORDER — LANSOPRAZOLE 30 MG PO CPDR: 30.0000 mg | DELAYED_RELEASE_CAPSULE | Freq: Every day | ORAL | 1 refills | Status: DC

## 2017-01-20 NOTE — Assessment & Plan Note (Signed)
Improved Patient re-educated about  the importance of commitment to a  minimum of 150 minutes of exercise per week.  The importance of healthy food choices with portion control discussed. Encouraged to start a food diary, count calories and to consider  joining a support group. Sample diet sheets offered. Goals set by the patient for the next several months.   Weight /BMI 01/20/2017 12/03/2016 10/30/2016  WEIGHT 197 lb 1.9 oz 201 lb 203 lb 1.9 oz  HEIGHT 5\' 5"  5\' 5"  5\' 6"   BMI 32.8 kg/m2 33.45 kg/m2 32.78 kg/m2

## 2017-01-20 NOTE — Assessment & Plan Note (Signed)
Past due , will refer to Dr fields for re evaluation

## 2017-01-20 NOTE — Progress Notes (Signed)
   Latoya Decker     MRN: [id number]      DOB: 12/16/1960   HPI Latoya Decker is here for recurrent disabling sore throat associated at times with symptoms of reflux. Preventive health is updated, specifically  Cancer screening and Immunization.   She has had 2 ED visit and 2 oV for this problem since April 2018 The PT denies any adverse reactions to current medications since the last visit.  Pt reports recurrent episodes of SEVERE disabling sore throat took her off vacation in April, would have taken her out of work if sh was working in July, rated at a 10, unable to swallow, unable to sleep, losing weight when occurs, excess phlegm produce during acute flare, no fever , chills, sinus drainage or cough, rapid strep negative twice seen in ED twice in April and office once in April, rapid strep once in July negative   ROS Denies recent fever or chills. Denies sinus pressure, nasal congestion, ear pain or sore throat. Denies chest congestion, productive cough or wheezing. Denies chest pains, palpitations and leg swelling  Denies dysuria, frequency, hesitancy or incontinence. Denies joint pain, swelling and limitation in mobility. Denies headaches, seizures, numbness, or tingling. Denies depression, anxiety or insomnia. Denies skin break down or rash.   PE  BP 114/68 (BP Location: Left Arm, Patient Position: Sitting, Cuff Size: Large)   Pulse 78   Temp (!) 96.9 F (36.1 C) (Temporal)   Resp 16   Ht 5\' 5"  (1.651 m)   Wt 197 lb 1.9 oz (89.4 kg)   SpO2 98%   BMI 32.80 kg/m   Patient alert and oriented and in no cardiopulmonary distress.  HEENT: No facial asymmetry, EOMI,   oropharynx pink and moist.  Neck supple no JVD, .Thyromegaly, mildly tender on right side of neck  Chest: Clear to auscultation bilaterally.  CVS: S1, S2 no murmurs, no S3.Regular rate.  ABD: Soft non tender.   Ext: No edema  MS: Adequate ROM spine, shoulders, hips and knees.  Skin: Intact, no  ulcerations or rash noted.  Psych: Good eye contact, normal affect. Memory intact not anxious or depressed appearing.  CNS: CN 2-12 intact, power,  normal throughout.no focal deficits noted.   Assessment & Plan  Goiter Thyroid US to evaluate  Sore throat Recurrent disabling sore throat , 2 occasions in  Past 3 months, needs ENT evaluation  Family hx of colon cancer Past due , will refer to Dr fields for re evaluation  Odynophagia Disabling recurrent painful swallowing for days , with anorexia and reported weight loss, likely gERD, refer to GI for eval, get breath test, denies pPI use   Migraine variant without intractability No med change  Obesity (BMI 30.0-34.9) Improved Patient re-educated about  the importance of commitment to a  minimum of 150 minutes of exercise per week.  The importance of healthy food choices with portion control discussed. Encouraged to start a food diary, count calories and to consider  joining a support group. Sample diet sheets offered. Goals set by the patient for the next several months.   Weight /BMI 01/20/2017 12/03/2016 10/30/2016  WEIGHT 197 lb 1.9 oz 201 lb 203 lb 1.9 oz  HEIGHT 5\' 5"  5\' 5"  5\' 6"   BMI 32.8 kg/m2 33.45 kg/m2 32.78 kg/m2

## 2017-01-20 NOTE — Patient Instructions (Addendum)
Annual physical exam in 3.5 month, call if you need me before  Breath test today, then start prevacid daily for reflux  You are referred to Dr Oneida Alar for colonoscopy and evaluation of reflux/ recurrent sore throat and painful swallowing intermittently  You are referred for [place] of neck  You are referred to ENT  It is important that you exercise regularly at least 30 minutes 5 times a week. If you develop chest pain, have severe difficulty breathing, or feel very tired, stop exercising immediately and seek medical attention    Thank you  for choosing [facility]. We consider it a privelige to serve you.  Delivering excellent health care in a caring and  compassionate way is our goal.  Partnering with you,  so that together we can achieve this goal is our strategy.

## 2017-01-20 NOTE — Telephone Encounter (Signed)
She was not taking any, she is to sTART prevacid today after the breath test

## 2017-01-20 NOTE — Telephone Encounter (Signed)
Noted  

## 2017-01-20 NOTE — Assessment & Plan Note (Signed)
Disabling recurrent painful swallowing for days , with anorexia and reported weight loss, likely gERD, refer to GI for eval, get breath test, denies pPI use

## 2017-01-20 NOTE — Telephone Encounter (Signed)
Follow up  Pt voiced she is returning nurses call

## 2017-01-20 NOTE — Telephone Encounter (Signed)
Nurse called from [facility] lab to inform us pt will have to reschedule her appt with them due to taking acid reflex medication. Wanted to call and give [place] an heads up.

## 2017-01-20 NOTE — Assessment & Plan Note (Signed)
Recurrent disabling sore throat , 2 occasions in  Past 3 months, needs ENT evaluation

## 2017-01-20 NOTE — Assessment & Plan Note (Signed)
No med change

## 2017-01-20 NOTE — Assessment & Plan Note (Signed)
Thyroid US to evaluate

## 2017-01-21 NOTE — Telephone Encounter (Signed)
Patient aware of her [place] date and time

## 2017-01-23 ENCOUNTER — Ambulatory Visit (HOSPITAL_COMMUNITY)
Admission: RE | Admit: 2017-01-23 | Discharge: 2017-01-23 | Disposition: A | Discharge status: Home / Self Care | Payer: BLUE CROSS/BLUE SHIELD | Source: Ambulatory Visit | Attending: Family Medicine | Admitting: Family Medicine

## 2017-01-23 DIAGNOSIS — E010 Iodine-deficiency related diffuse (endemic) goiter: Secondary | ICD-10-CM | POA: Diagnosis not present

## 2017-01-23 DIAGNOSIS — E049 Nontoxic goiter, unspecified: Secondary | ICD-10-CM

## 2017-01-23 DIAGNOSIS — E042 Nontoxic multinodular goiter: Secondary | ICD-10-CM | POA: Diagnosis not present

## 2017-02-25 DIAGNOSIS — R070 Pain in throat: Secondary | ICD-10-CM | POA: Diagnosis not present

## 2017-02-25 DIAGNOSIS — J343 Hypertrophy of nasal turbinates: Secondary | ICD-10-CM | POA: Diagnosis not present

## 2017-03-10 ENCOUNTER — Ambulatory Visit (INDEPENDENT_AMBULATORY_CARE_PROVIDER_SITE_OTHER): Payer: BLUE CROSS/BLUE SHIELD | Admitting: Otolaryngology

## 2017-03-10 DIAGNOSIS — J353 Hypertrophy of tonsils with hypertrophy of adenoids: Secondary | ICD-10-CM | POA: Diagnosis not present

## 2017-03-10 DIAGNOSIS — K219 Gastro-esophageal reflux disease without esophagitis: Secondary | ICD-10-CM | POA: Diagnosis not present

## 2017-03-10 DIAGNOSIS — R070 Pain in throat: Secondary | ICD-10-CM

## 2017-03-10 DIAGNOSIS — J3501 Chronic tonsillitis: Secondary | ICD-10-CM | POA: Diagnosis not present

## 2017-03-14 ENCOUNTER — Encounter: Payer: Self-pay | Admitting: Gastroenterology

## 2017-03-14 ENCOUNTER — Other Ambulatory Visit: Payer: Self-pay

## 2017-03-14 ENCOUNTER — Ambulatory Visit (INDEPENDENT_AMBULATORY_CARE_PROVIDER_SITE_OTHER): Payer: BLUE CROSS/BLUE SHIELD | Admitting: Gastroenterology

## 2017-03-14 VITALS — BP 139/82 | HR 78 | Temp 97.4°F | Ht 66.0 in | Wt 201.0 lb

## 2017-03-14 DIAGNOSIS — Z800 Family history of malignant neoplasm of digestive organs: Secondary | ICD-10-CM

## 2017-03-14 DIAGNOSIS — K219 Gastro-esophageal reflux disease without esophagitis: Secondary | ICD-10-CM

## 2017-03-14 DIAGNOSIS — R1310 Dysphagia, unspecified: Secondary | ICD-10-CM | POA: Diagnosis not present

## 2017-03-14 MED ORDER — NA SULFATE-K SULFATE-MG SULF 17.5-3.13-1.6 GM/177ML PO SOLN: 1.0000 | ORAL | 0 refills | Status: DC

## 2017-03-14 NOTE — Patient Instructions (Signed)
1. Colonoscopy and upper endoscopy as scheduled. Please see separate instructions. 

## 2017-03-14 NOTE — Progress Notes (Addendum)
REVIEWED-NO ADDITIONAL RECOMMENDATIONS.   Primary Care Physician: Fayrene Helper, MD  Primary Gastroenterologist:  Barney Drain, MD   Chief Complaint  Patient presents with  . Gastroesophageal Reflux    HPI: Latoya Decker is a 56 y.o. female hereAt the request of Dr. Moshe Cipro for further evaluation of odynophagia and colonoscopy.  Patient states she's had a severe sore throat off and on since April 2018. We'll last week at a time. She did multiple strep test that were negative. She's been seen in the ED twice. She has seen 2 different ENT specialist there is consideration of tonsillectomy for possible recurrent infections. Patient states that Dr. Benjamine Mola who saw changes of acid reflux as well. We've requested records. She reports the antibiotics have helped her severe throat pain in the past.  She also reports waking up in the middle the night with severe throat pain, burning quality. Her daytime heartburn is well-controlled. She has went to a strict antireflux diet. No significant improvement. She has more pain with swallowing. Feels in her chest. Denies dysphagia.  She has had a ultrasound of the neck showing mild thyromegaly with bilateral small nodules which will be followed.  Patient has a history of eosinophilic gastroenteritis back in 2015. History of H. Pylori. Dr. Moshe Cipro plan for H pylori breath test off of PPI for 2 weeks. Since we are planning on EGD patient can hold off for H. pylori breath test since we can check for H. pylori, the EGD.   Family history positive for colon cancer. Sister had colon cancer at age 73, mother at age 37. Patient is 1 year overdue getting her colonoscopy done.  Bowel movements are regular. No melena or rectal bleeding. No abdominal pain.    Current Outpatient Prescriptions  Medication Sig Dispense Refill  . baclofen (LIORESAL) 10 MG tablet One tablet twice daily if needed for migraine headache. Maximum use is twice monthly 30 each 1  .  ibuprofen (ADVIL,MOTRIN) 800 MG tablet Take 1 tablet (800 mg total) by mouth every 8 (eight) hours as needed. 30 tablet 1  . lansoprazole (PREVACID) 30 MG capsule Take 1 capsule (30 mg total) by mouth daily at 12 noon. 30 capsule 1  . topiramate (TOPAMAX) 25 MG capsule Take 100 mg by mouth at bedtime.    . traZODone (DESYREL) 50 MG tablet Take 1 tablet (50 mg total) by mouth at bedtime as needed for sleep. 30 tablet 4   No current facility-administered medications for this visit.     Allergies as of 03/14/2017 - Review Complete 03/14/2017  Allergen Reaction Noted  . Codeine Nausea And Vomiting 06/30/2008  . Hydrocodone Nausea And Vomiting 01/12/2010  . Lidocaine viscous  08/25/2013   Past Medical History:  Diagnosis Date  . Anxiety   . Depression   . Duodenal ulcer 01/05/2010   EGD Dr Dierdre Searles, h pylori gastritis, completed treatment  . Duodenitis   . Eosinophilic gastroenteritis JAN 2015   [place] 2014: NL CMP/GIARDIA Ag  . Family hx of colon cancer    Mother & sister  . Gastritis    hospitalized 6/23-6/25 2011   . Headache(784.0)    migraines  . Helicobacter pylori gastritis   . Hypertension   . PUD (peptic ulcer disease)    Dr Tamala Julian  . S/P colonoscopy 12/27/2003   Dr Voncille Lo  . S/P endoscopy 12/27/2003   Dr [place] Service superficial ulcers GEJ, duodenitis,gastritis   Past Surgical History:  Procedure Laterality Date  . ABDOMINAL HYSTERECTOMY    .  bilateral tubal ligation  1989  . BIOPSY  06/10/2011   SLF: mild gastritis, bx benign, sb bx benign   . CHOLECYSTECTOMY  1997   aph-Smith  . COLONOSCOPY  05/2011   SLF: multiple hyperplastic sessile polyps, scattered diverticulosis. next TCS 05/2016 ([facility])  . ESOPHAGOGASTRODUODENOSCOPY N/A 6/71/2458   EOS INOPHILIC GASTROENTERITIS  . PARTIAL HYSTERECTOMY  2007   1 ovary removed  . POLYPECTOMY  06/10/2011   Procedure: POLYPECTOMY;  Surgeon: Dorothyann Peng, MD;  Location: [facility];  Service: Endoscopy;;  Cecal polypectomy;  Sigmoid colon polypectomies  . TUBAL LIGATION     Family History  Problem Relation Age of Onset  . Colon cancer Mother 28  . Diabetes Mother   . Hypertension Mother   . Stroke Mother   . Heart attack Father   . Diabetes Father   . Hypertension Father   . Colon cancer Sister 50  . Lupus Brother   . Parkinsonism Brother   . Anesthesia problems Neg Hx   . Hypotension Neg Hx   . Malignant hyperthermia Neg Hx   . Pseudochol deficiency Neg Hx    Social History   Social History  . Marital status: Married    Spouse name: Latoya Decker  . Number of children: 3  . Years of education: College   Occupational History  . 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  .  [employer]   Social History Main Topics  . Smoking status: Never Smoker  . Smokeless tobacco: Never Used     Comment: Never smoker  . Alcohol use No  . Drug use: No  . Sexual activity: Yes    Partners: Male    Birth control/ protection: Surgical   Other Topics Concern  . None   Social History Narrative   Patient is married Latoya Decker) and lives at home with her husband and her granddaughter.   Patient has three adult children.   Patient works full-time.   Patient has a college education.   Patient is right-handed.   Patient drinks one cup of tea daily.    ROS:  General: Negative for anorexia, weight loss, fever, chills, fatigue, weakness. ENT: Negative for hoarseness, difficulty swallowing , nasal congestion. See history of present illness CV: Negative for chest pain, angina, palpitations, dyspnea on exertion, peripheral edema.  Respiratory: Negative for dyspnea at rest, dyspnea on exertion, cough, sputum, wheezing.  GI: See history of present illness. GU:  Negative for dysuria, hematuria, urinary incontinence, urinary frequency, nocturnal urination.  Endo: Negative for unusual weight change.    Physical Examination:   BP 139/82   Pulse 78   Temp (!) 97.4 F (36.3 C) (Oral)   Ht 5\' 6"  (1.676 m)   Wt 201 lb (91.2 kg)    BMI 32.44 kg/m   General: Well-nourished, well-developed in no acute distress.  Eyes: No icterus. Mouth: Oropharyngeal mucosa moist and pink , no lesions erythema or exudate. Lungs: Clear to auscultation bilaterally.  Heart: Regular rate and rhythm, no murmurs rubs or gallops.  Abdomen: Bowel sounds are normal, nontender, nondistended, no hepatosplenomegaly or masses, no abdominal bruits or hernia , no rebound or guarding.   Extremities: No lower extremity edema. No clubbing or deformities. Neuro: Alert and oriented x 4   Skin: Warm and dry, no jaundice.   Psych: Alert and cooperative, normal mood and affect.  Labs:  Lab Results  Component Value Date   CREATININE 0.97 10/15/2016   BUN 10 10/15/2016   NA 142 10/15/2016  K 3.9 10/15/2016   CL 109 10/15/2016   CO2 23 10/15/2016   Lab Results  Component Value Date   WBC 7.8 10/15/2016   HGB 14.4 10/15/2016   HCT 43.5 10/15/2016   MCV 89.3 10/15/2016   PLT 293 10/15/2016     Imaging Studies: No results found.

## 2017-03-19 NOTE — Assessment & Plan Note (Signed)
Possibly 2 separate issues. She describes intermittent weeklong episodes of severe throat pain associated with fever which may be secondary to recurrent infection. ENT is following her for this. Reportedly evidence of LPR per patient on exam by Dr.Teoh. We have requested records. She complains of odynophagia as food goes down. Would offer EGD to evaluate for infection of the esophagus disease (Candida or viral), eosinophilic esophagitis, reflux esophagitis in the near future for further evaluation.  I have discussed the risks, alternatives, benefits with regards to but not limited to the risk of reaction to medication, bleeding, infection, perforation and the patient is agreeable to proceed. Written consent to be obtained.

## 2017-03-19 NOTE — Progress Notes (Signed)
cc'ed to pcp °

## 2017-03-19 NOTE — Assessment & Plan Note (Signed)
Due for high risk screening colonoscopy.  I have discussed the risks, alternatives, benefits with regards to but not limited to the risk of reaction to medication, bleeding, infection, perforation and the patient is agreeable to proceed. Written consent to be obtained.

## 2017-04-21 ENCOUNTER — Encounter (HOSPITAL_COMMUNITY)
Admission: RE | Disposition: A | Discharge status: Home / Self Care | Payer: Self-pay | Source: Ambulatory Visit | Attending: Gastroenterology

## 2017-04-21 ENCOUNTER — Encounter (HOSPITAL_COMMUNITY): Payer: Self-pay | Admitting: *Deleted

## 2017-04-21 ENCOUNTER — Ambulatory Visit (HOSPITAL_COMMUNITY)
Admission: RE | Admit: 2017-04-21 | Discharge: 2017-04-21 | Disposition: A | Discharge status: Home / Self Care | Payer: BLUE CROSS/BLUE SHIELD | Source: Ambulatory Visit | Attending: Gastroenterology | Admitting: Gastroenterology

## 2017-04-21 DIAGNOSIS — Z888 Allergy status to other drugs, medicaments and biological substances status: Secondary | ICD-10-CM | POA: Diagnosis not present

## 2017-04-21 DIAGNOSIS — B9681 Helicobacter pylori [H. pylori] as the cause of diseases classified elsewhere: Secondary | ICD-10-CM | POA: Diagnosis not present

## 2017-04-21 DIAGNOSIS — K219 Gastro-esophageal reflux disease without esophagitis: Secondary | ICD-10-CM | POA: Diagnosis not present

## 2017-04-21 DIAGNOSIS — K644 Residual hemorrhoidal skin tags: Secondary | ICD-10-CM | POA: Diagnosis not present

## 2017-04-21 DIAGNOSIS — K2960 Other gastritis without bleeding: Secondary | ICD-10-CM

## 2017-04-21 DIAGNOSIS — I10 Essential (primary) hypertension: Secondary | ICD-10-CM | POA: Insufficient documentation

## 2017-04-21 DIAGNOSIS — K648 Other hemorrhoids: Secondary | ICD-10-CM | POA: Diagnosis not present

## 2017-04-21 DIAGNOSIS — D122 Benign neoplasm of ascending colon: Secondary | ICD-10-CM | POA: Diagnosis not present

## 2017-04-21 DIAGNOSIS — Z800 Family history of malignant neoplasm of digestive organs: Secondary | ICD-10-CM | POA: Insufficient documentation

## 2017-04-21 DIAGNOSIS — T39395A Adverse effect of other nonsteroidal anti-inflammatory drugs [NSAID], initial encounter: Secondary | ICD-10-CM

## 2017-04-21 DIAGNOSIS — K621 Rectal polyp: Secondary | ICD-10-CM | POA: Diagnosis not present

## 2017-04-21 DIAGNOSIS — Z79899 Other long term (current) drug therapy: Secondary | ICD-10-CM | POA: Insufficient documentation

## 2017-04-21 DIAGNOSIS — K2950 Unspecified chronic gastritis without bleeding: Secondary | ICD-10-CM | POA: Diagnosis not present

## 2017-04-21 DIAGNOSIS — R1310 Dysphagia, unspecified: Secondary | ICD-10-CM | POA: Insufficient documentation

## 2017-04-21 DIAGNOSIS — F329 Major depressive disorder, single episode, unspecified: Secondary | ICD-10-CM | POA: Insufficient documentation

## 2017-04-21 DIAGNOSIS — Q438 Other specified congenital malformations of intestine: Secondary | ICD-10-CM | POA: Insufficient documentation

## 2017-04-21 DIAGNOSIS — K635 Polyp of colon: Secondary | ICD-10-CM | POA: Diagnosis not present

## 2017-04-21 DIAGNOSIS — F419 Anxiety disorder, unspecified: Secondary | ICD-10-CM | POA: Diagnosis not present

## 2017-04-21 DIAGNOSIS — Z1211 Encounter for screening for malignant neoplasm of colon: Secondary | ICD-10-CM | POA: Diagnosis not present

## 2017-04-21 HISTORY — PX: COLONOSCOPY: SHX5424

## 2017-04-21 HISTORY — PX: ESOPHAGOGASTRODUODENOSCOPY: SHX5428

## 2017-04-21 SURGERY — COLONOSCOPY
Anesthesia: Moderate Sedation

## 2017-04-21 MED ORDER — SODIUM CHLORIDE 0.9% FLUSH
INTRAVENOUS | Status: AC
  Filled 2017-04-21: qty 10

## 2017-04-21 MED ORDER — MEPERIDINE HCL 100 MG/ML IJ SOLN
INTRAMUSCULAR | Status: AC
  Filled 2017-04-21: qty 2

## 2017-04-21 MED ORDER — LIDOCAINE VISCOUS 2 % MT SOLN
OROMUCOSAL | Status: AC
  Filled 2017-04-21: qty 15

## 2017-04-21 MED ORDER — LANSOPRAZOLE 30 MG PO CPDR: DELAYED_RELEASE_CAPSULE | ORAL | 11 refills | Status: DC

## 2017-04-21 MED ORDER — MEPERIDINE HCL 100 MG/ML IJ SOLN
INTRAMUSCULAR | Status: DC | PRN
  Administered 2017-04-21: 25 mg via INTRAVENOUS
  Administered 2017-04-21: 50 mg via INTRAVENOUS

## 2017-04-21 MED ORDER — PROMETHAZINE HCL 25 MG/ML IJ SOLN
25.0000 mg | Freq: Once | INTRAMUSCULAR | Status: AC
  Administered 2017-04-21: 25 mg via INTRAVENOUS

## 2017-04-21 MED ORDER — BUTAMBEN-TETRACAINE-BENZOCAINE 2-2-14 % EX AERO
INHALATION_SPRAY | CUTANEOUS | Status: AC
  Filled 2017-04-21: qty 5

## 2017-04-21 MED ORDER — PROMETHAZINE HCL 25 MG/ML IJ SOLN
INTRAMUSCULAR | Status: AC
  Filled 2017-04-21: qty 1

## 2017-04-21 MED ORDER — MIDAZOLAM HCL 5 MG/5ML IJ SOLN
INTRAMUSCULAR | Status: DC | PRN
  Administered 2017-04-21: 2 mg via INTRAVENOUS
  Administered 2017-04-21: 1 mg via INTRAVENOUS
  Administered 2017-04-21: 2 mg via INTRAVENOUS

## 2017-04-21 MED ORDER — STERILE WATER FOR IRRIGATION IR SOLN
Status: DC | PRN
  Administered 2017-04-21: 10:00:00

## 2017-04-21 MED ORDER — SODIUM CHLORIDE 0.9 % IV SOLN
INTRAVENOUS | Status: DC
  Administered 2017-04-21: 1000 mL via INTRAVENOUS

## 2017-04-21 MED ORDER — MIDAZOLAM HCL 5 MG/5ML IJ SOLN
INTRAMUSCULAR | Status: AC
  Filled 2017-04-21: qty 10

## 2017-04-21 NOTE — Op Note (Signed)
[facility] Patient Name: Latoya Decker Procedure Date: 04/21/2017 9:59 AM MRN: [id number] Date of Birth: [birth year] Attending MD: Barney Drain MD, MD CSN: [id number] Age: 56 Admit Type: Outpatient Procedure:                Upper GI endoscopy WITH COLD FORCEPS BIOPSY Indications:              Odynophagia Providers:                Barney Drain MD, MD, Janeece Riggers, RN, Aram Candela Referring MD:             Norwood Levo. Simpson MD, MD Medicines:                TCS+ Midazolam 1 mg IV Complications:            No immediate complications. Estimated Blood Loss:     Estimated blood loss was minimal. Procedure:                Pre-Anesthesia Assessment:                           - Prior to the procedure, a History and Physical                            was performed, and patient medications and                            allergies were reviewed. The patient's tolerance of                            previous anesthesia was also reviewed. The risks                            and benefits of the procedure and the sedation                            options and risks were discussed with the patient.                            All questions were answered, and informed consent                            was obtained. Prior Anticoagulants: The patient has                            taken no previous anticoagulant or antiplatelet                            agents. ASA Grade Assessment: II - A patient with                            mild systemic disease. After reviewing the risks                            and benefits, the patient was deemed in  satisfactory condition to undergo the procedure.                            After obtaining informed consent, the endoscope was                            passed under direct vision. Throughout the                            procedure, the patient's blood pressure, pulse, and                            oxygen saturations were  monitored continuously. The                            [id number] ([id number]) scope was introduced through the                            mouth, and advanced to the second part of duodenum.                            The upper GI endoscopy was accomplished without                            difficulty. The patient tolerated the procedure                            well. Scope In: 10:49:10 AM Scope Out: 10:58:30 AM Total Procedure Duration: 0 hours 9 minutes 20 seconds  Findings:      The examined esophagus was normal. Biopsies were obtained from the       proximal and distal esophagus with cold forceps for histology of       suspected eosinophilic esophagitis.      Patchy mild inflammation characterized by congestion (edema) and       erythema was found in the gastric antrum. Biopsies were taken with a       cold forceps for histology.      The examined duodenum was normal. Impression:               - NO SOURCE FOR ODYNOPHAGIA IDENTIFIED. SYMPTOMS                            MOST LIKELY DUE TO UNCONTROLLED REFLUX.                           - MILD Gastritis. Moderate Sedation:      Moderate (conscious) sedation was administered by the endoscopy nurse       and supervised by the endoscopist. The following parameters were       monitored: oxygen saturation, heart rate, blood pressure, and response       to care. Total physician intraservice time was 43 minutes. Recommendation:           - High fiber diet and low fat diet. AVOID REFLUX  TRIGGERS. LOSE 10-15 LBS.                           - Continue present medications.                           - Await pathology results.                           - Return to my office in 4 months.                           - Patient has a contact number available for                            emergencies. The signs and symptoms of potential                            delayed complications were discussed with the                             patient. Return to normal activities tomorrow.                            Written discharge instructions were provided to the                            patient. Procedure Code(s):        --- Professional ---                           [phone], Esophagogastroduodenoscopy, flexible,                            transoral; with biopsy, single or multiple                           99152, Moderate sedation services provided by the                            same physician or other qualified health care                            professional performing the diagnostic or                            therapeutic service that the sedation supports,                            requiring the presence of an independent trained                            observer to assist in the monitoring of the                            patient's level of  consciousness and physiological                            status; initial 15 minutes of intraservice time,                            patient age 56 years or older                           [phone], Moderate sedation services; each additional                            15 minutes intraservice time                           99153, Moderate sedation services; each additional                            15 minutes intraservice time Diagnosis Code(s):        --- Professional ---                           K29.70, Gastritis, unspecified, without bleeding                           R13.10, Dysphagia, unspecified CPT copyright 2016 American [employer]. All rights reserved. The codes documented in this report are preliminary and upon coder review may  be revised to meet current compliance requirements. Barney Drain, MD Barney Drain MD, MD 04/21/2017 11:12:40 AM This report has been signed electronically. Number of Addenda: 0

## 2017-04-21 NOTE — H&P (Signed)
Primary Care Physician:  Fayrene Helper, MD Primary Gastroenterologist:  Dr. Oneida Alar  Pre-Procedure History & Physical: HPI:  Latoya Decker is a 56 y.o. female here for GERD/SCREENING.  Past Medical History:  Diagnosis Date  . Anxiety   . Depression   . Duodenal ulcer 01/05/2010   EGD Dr Dierdre Searles, h pylori gastritis, completed treatment  . Duodenitis   . Eosinophilic gastroenteritis JAN 2015   [facility] 2014: NL CMP/GIARDIA Ag  . Family hx of colon cancer    Mother & sister  . Gastritis    hospitalized 6/23-6/25 2011   . Headache(784.0)    migraines  . Helicobacter pylori gastritis   . Hypertension   . PUD (peptic ulcer disease)    Dr Tamala Julian  . S/P colonoscopy 12/27/2003   Dr Voncille Lo  . S/P endoscopy 12/27/2003   Dr [facility] superficial ulcers GEJ, duodenitis,gastritis    Past Surgical History:  Procedure Laterality Date  . ABDOMINAL HYSTERECTOMY    . bilateral tubal ligation  1989  . BIOPSY  06/10/2011   SLF: mild gastritis, bx benign, sb bx benign   . CHOLECYSTECTOMY  1997   aph-Smith  . COLONOSCOPY  05/2011   SLF: multiple hyperplastic sessile polyps, scattered diverticulosis. next TCS 05/2016 ([facility])  . ESOPHAGOGASTRODUODENOSCOPY N/A 5/59/7416   EOS INOPHILIC GASTROENTERITIS  . PARTIAL HYSTERECTOMY  2007   1 ovary removed  . POLYPECTOMY  06/10/2011   Procedure: POLYPECTOMY;  Surgeon: Dorothyann Peng, MD;  Location: [facility];  Service: Endoscopy;;  Cecal polypectomy; Sigmoid colon polypectomies  . TUBAL LIGATION      Prior to Admission medications   Medication Sig Start Date End Date Taking? Authorizing Provider  baclofen (LIORESAL) 10 MG tablet One tablet twice daily if needed for migraine headache. Maximum use is twice monthly Patient taking differently: Take 10 mg by mouth 2 (two) times daily as needed (for migraine.). One tablet twice daily if needed for migraine headache. Maximum use is twice monthly 10/15/16  Yes Fayrene Helper, MD  Na Sulfate-K  Sulfate-Mg Sulf (SUPREP BOWEL PREP KIT) 17.5-3.13-1.6 GM/180ML SOLN Take 1 kit by mouth as directed. 03/14/17  Yes Wanya Bangura L, MD  topiramate (TOPAMAX) 25 MG tablet Take 100 mg by mouth at bedtime.   Yes [provider]  traZODone (DESYREL) 50 MG tablet Take 1 tablet (50 mg total) by mouth at bedtime as needed for sleep. 07/19/15  Yes Fayrene Helper, MD  fluticasone (FLONASE) 50 MCG/ACT nasal spray Place 1-2 sprays into both nostrils daily as needed for allergies.    [provider]  ibuprofen (ADVIL,MOTRIN) 800 MG tablet Take 1 tablet (800 mg total) by mouth every 8 (eight) hours as needed. Patient not taking: Reported on 04/16/2017 01/20/17   Fayrene Helper, MD  lansoprazole (PREVACID) 30 MG capsule Take 1 capsule (30 mg total) by mouth daily at 12 noon. 01/20/17   Fayrene Helper, MD    Allergies as of 03/14/2017 - Review Complete 03/14/2017  Allergen Reaction Noted  . Codeine Nausea And Vomiting 06/30/2008  . Hydrocodone Nausea And Vomiting 01/12/2010  . Lidocaine viscous  08/25/2013    Family History  Problem Relation Age of Onset  . Colon cancer Mother 80  . Diabetes Mother   . Hypertension Mother   . Stroke Mother   . Heart attack Father   . Diabetes Father   . Hypertension Father   . Colon cancer Sister 30  . Lupus Brother   . Parkinsonism Brother   .  Anesthesia problems Neg Hx   . Hypotension Neg Hx   . Malignant hyperthermia Neg Hx   . Pseudochol deficiency Neg Hx     Social History   Social History  . Marital status: Married    Spouse name: [name]  . Number of children: 3  . Years of education: College   Occupational History  . Dialysis [employer]  .  [employer]   Social History Main Topics  . Smoking status: Never Smoker  . Smokeless tobacco: Never Used     Comment: Never smoker  . Alcohol use No  . Drug use: No  . Sexual activity: Yes    Partners: Male    Birth control/ protection: Surgical   Other Topics  Concern  . Not on file   Social History Narrative   Patient is married [name]) and lives at home with her husband and her granddaughter.   Patient has three adult children.   Patient works full-time.   Patient has a college education.   Patient is right-handed.   Patient drinks one cup of tea daily.    Review of Systems: See HPI, otherwise negative ROS   Physical Exam: BP 123/79   Pulse 65   Temp 97.8 F (36.6 C) (Oral)   Resp 14   Ht 5' 6" (1.676 m)   Wt 194 lb (88 kg)   SpO2 98%   BMI 31.31 kg/m  General:   Alert,  pleasant and cooperative in NAD Head:  Normocephalic and atraumatic. Neck:  Supple; Lungs:  Clear throughout to auscultation.    Heart:  Regular rate and rhythm. Abdomen:  Soft, nontender and nondistended. Normal bowel sounds, without guarding, and without rebound.   Neurologic:  Alert and  oriented x4;  grossly normal neurologically.  Impression/Plan:    GERD/SCREENING  Plan:  1. EGD/TCS TODAY DISCUSSED PROCEDURE, BENEFITS, & RISKS: < 1% chance of medication reaction, bleeding, perforation, or rupture of spleen/liver.

## 2017-04-21 NOTE — Op Note (Signed)
[facility] Patient Name: Latoya Decker Procedure Date: 04/21/2017 9:53 AM MRN: [id number] Date of Birth: 09-16-1960 Attending MD: Barney Drain MD, MD CSN: [id number] Age: 56 Admit Type: Outpatient Procedure:                Colonoscopy WITH COLD FORCEP POLYPECTOMY Indications:              Family history of colon cancer in multiple                            first-degree relatives(MOTHER AGE < 60, SISTER AGE                            < 21). Providers:                Barney Drain MD, MD, Janeece Riggers, RN, Aram Candela Referring MD:             Norwood Levo. Simpson MD, MD Medicines:                Promethazine 25 mg IV, Meperidine 75 mg IV,                            Midazolam 4 mg IV Complications:            No immediate complications. Estimated Blood Loss:     Estimated blood loss was minimal. Procedure:                Pre-Anesthesia Assessment:                           - Prior to the procedure, a History and Physical                            was performed, and patient medications and                            allergies were reviewed. The patient's tolerance of                            previous anesthesia was also reviewed. The risks                            and benefits of the procedure and the sedation                            options and risks were discussed with the patient.                            All questions were answered, and informed consent                            was obtained. Prior Anticoagulants: The patient has                            taken no previous anticoagulant or antiplatelet  agents. ASA Grade Assessment: II - A patient with                            mild systemic disease. After reviewing the risks                            and benefits, the patient was deemed in                            satisfactory condition to undergo the procedure.                            After obtaining informed consent, the  colonoscope                            was passed under direct vision. Throughout the                            procedure, the patient's blood pressure, pulse, and                            oxygen saturations were monitored continuously. The                            [id number] ([id number]) scope was introduced through                            the anus and advanced to the the cecum, identified                            by appendiceal orifice and ileocecal valve. The                            colonoscopy was somewhat difficult due to a                            tortuous colon. Successful completion of the                            procedure was aided by increasing the dose of                            sedation medication and COLOWRAP. The patient                            tolerated the procedure fairly well. The quality of                            the bowel preparation was excellent. The ileocecal                            valve, appendiceal orifice, and rectum were  photographed. Scope In: 10:26:27 AM Scope Out: 10:42:55 AM Scope Withdrawal Time: 0 hours 12 minutes 1 second  Total Procedure Duration: 0 hours 16 minutes 28 seconds  Findings:      Four sessile polyps were found in the rectum and ascending colon. The       polyps were 2 to 4 mm in size. These polyps were removed with a cold       biopsy forceps. Resection and retrieval were complete.      Multiple small and large-mouthed diverticula were found in the entire       colon.      The recto-sigmoid colon and sigmoid colon were moderately redundant.      External and internal hemorrhoids were found during retroflexion. The       hemorrhoids were moderate. Impression:               - Four 2 to 4 mm polyps in the rectum and in the                            ascending colon, removed with a cold biopsy                            forceps. Resected and retrieved.                           - MODERATE  Diverticulosis in the entire examined                            colon.                           - Redundant LEFT colon.                           - External and internal hemorrhoids. Moderate Sedation:      Moderate (conscious) sedation was administered by the endoscopy nurse       and supervised by the endoscopist. The following parameters were       monitored: oxygen saturation, heart rate, blood pressure, and response       to care. Total physician intraservice time was 43 minutes. Recommendation:           - Repeat colonoscopy in 5 years for surveillance.                           - High fiber diet.                           - Continue present medications.                           - Await pathology results.                           - Return to my office in 4 months.                           - Patient has a contact number available for  emergencies. The signs and symptoms of potential                            delayed complications were discussed with the                            patient. Return to normal activities tomorrow.                            Written discharge instructions were provided to the                            patient. Procedure Code(s):        --- Professional ---                           [phone], Colonoscopy, flexible; with biopsy, single                            or multiple                           99152, Moderate sedation services provided by the                            same physician or other qualified health care                            professional performing the diagnostic or                            therapeutic service that the sedation supports,                            requiring the presence of an independent trained                            observer to assist in the monitoring of the                            patient's level of consciousness and physiological                            status; initial 15  minutes of intraservice time,                            patient age 8 years or older                           [phone], Moderate sedation services; each additional                            15 minutes intraservice time                           [phone],  Moderate sedation services; each additional                            15 minutes intraservice time Diagnosis Code(s):        --- Professional ---                           K62.1, Rectal polyp                           D12.2, Benign neoplasm of ascending colon                           K64.8, Other hemorrhoids                           Z80.0, Family history of malignant neoplasm of                            digestive organs                           K57.30, Diverticulosis of large intestine without                            perforation or abscess without bleeding                           Q43.8, Other specified congenital malformations of                            intestine CPT copyright 2016 American Medical Association. All rights reserved. The codes documented in this report are preliminary and upon coder review may  be revised to meet current compliance requirements. Barney Drain, MD Barney Drain MD, MD 04/21/2017 11:08:36 AM This report has been signed electronically. Number of Addenda: 0

## 2017-04-21 NOTE — Discharge Instructions (Signed)
You had 4 polyps removed. You have EROSIVE gastritis You have MODERATE internal hemorrhoids and diverticulosis IN YOUR ENTIRE COLON. YOU HAVE MILD GASTRITIS. NO SOURCE FOR YOUR PAIN WITH SWALLOWING WAS IDENTIFIED. IT IS MOST LIKELY DUE T UNCONTROLLED REFLUX.  I biopsied your ESOPHAGUS ANDstomach.    DRINK WATER TO KEEP YOUR URINE LIGHT YELLOW.  CONTINUE YOUR WEIGHT LOSS EFFORTS.  WHILE I DO NOT WANT TO ALARM YOU, YOUR BODY MASS INDEX IS OVER 30 WHICH MEANS YOU ARE OBESE. OBESITY IS ASSOCIATED WITH AN INCREASED RISK FOR  CIRRHOSIS AND ALL CANCERS, INCLUDING ESOPHAGEAL AND COLON CANCER. A WEIGHT OF  185 LBS   WILL GET YOUR BODY MASS INDEX(BMI) UNDER 30.   CONTINUE PREVACID. TAKE 30 MINUTES PRIOR TO BREAKFAST.  YOUR BIOPSY RESULTS WILL BE AVAILABLE IN MY CHART AFTER OCT 11 AND MY OFFICE WILL CONTACT YOU IN 10-14 DAYS WITH YOUR RESULTS.   FOLLOW UP IN 4 MOS. February 8 at 9am with Neil Crouch  Next colonoscopy in 5 years.   ENDOSCOPY Care After Read the instructions outlined below and refer to this sheet in the next week. These discharge instructions provide you with general information on caring for yourself after you leave the hospital. While your treatment has been planned according to the most current medical practices available, unavoidable complications occasionally occur. If you have any problems or questions after discharge, call DR. FIELDS, [phone].  ACTIVITY  You may resume your regular activity, but move at a slower pace for the next 24 hours.   Take frequent rest periods for the next 24 hours.   Walking will help get rid of the air and reduce the bloated feeling in your belly (abdomen).   No driving for 24 hours (because of the medicine (anesthesia) used during the test).   You may shower.   Do not sign any important legal documents or operate any machinery for 24 hours (because of the anesthesia used during the test).    NUTRITION  Drink plenty of fluids.   You  may resume your normal diet as instructed by your doctor.   Begin with a light meal and progress to your normal diet. Heavy or fried foods are harder to digest and may make you feel sick to your stomach (nauseated).   Avoid alcoholic beverages for 24 hours or as instructed.    MEDICATIONS  You may resume your normal medications.   WHAT YOU CAN EXPECT TODAY  Some feelings of bloating in the abdomen.   Passage of more gas than usual.   Spotting of blood in your stool or on the toilet paper  .  IF YOU HAD POLYPS REMOVED DURING THE ENDOSCOPY:  Eat a soft diet IF YOU HAVE NAUSEA, BLOATING, ABDOMINAL PAIN, OR VOMITING.    FINDING OUT THE RESULTS OF YOUR TEST Not all test results are available during your visit. DR. Oneida Alar WILL CALL YOU WITHIN 14 DAYS OF YOUR PROCEDUE WITH YOUR RESULTS. Do not assume everything is normal if you have not heard from DR. FIELDS, CALL HER OFFICE AT [phone].  SEEK IMMEDIATE MEDICAL ATTENTION AND CALL THE OFFICE: [phone] IF:  You have more than a spotting of blood in your stool.   Your belly is swollen (abdominal distention).   You are nauseated or vomiting.   You have a temperature over 101F.   You have abdominal pain or discomfort that is severe or gets worse throughout the day.  Gastritis  Gastritis is an inflammation (the body's way of reacting  to injury and/or infection) of the stomach. It is often caused by viral or bacterial (germ) infections. It can also be caused BY ASPIRIN, BC/GOODY POWDER'S, (IBUPROFEN) MOTRIN, OR ALEVE (NAPROXEN), chemicals (including alcohol), SPICY FOODS, and medications. This illness may be associated with generalized malaise (feeling tired, not well), UPPER ABDOMINAL STOMACH cramps, and fever. One common bacterial cause of gastritis is an organism known as H. Pylori. This can be treated with antibiotics.    Polyps, Colon  A polyp is extra tissue that grows inside your body. Colon polyps grow in the large  intestine. The large intestine, also called the colon, is part of your digestive system. It is a long, hollow tube at the end of your digestive tract where your body makes and stores stool. Most polyps are not dangerous. They are benign. This means they are not cancerous. But over time, some types of polyps can turn into cancer. Polyps that are smaller than a pea are usually not harmful. But larger polyps could someday become or may already be cancerous. To be safe, doctors remove all polyps and test them.    PREVENTION There is not one sure way to prevent polyps. You might be able to lower your risk of getting them if you:  Eat more fruits and vegetables and less fatty food.   Do not smoke.   Avoid alcohol.   Exercise every day.   Lose weight if you are overweight.   Eating more calcium and folate can also lower your risk of getting polyps. Some foods that are rich in calcium are milk, cheese, and broccoli. Some foods that are rich in folate are chickpeas, kidney beans, and spinach.   Diverticulosis Diverticulosis is a common condition that develops when small pouches (diverticula) form in the wall of the colon. The risk of diverticulosis increases with age. It happens more often in people who eat a low-fiber diet. Most individuals with diverticulosis have no symptoms. Those individuals with symptoms usually experience belly (abdominal) pain, constipation, or loose stools (diarrhea).  HOME CARE INSTRUCTIONS  Increase the amount of fiber in your diet as directed by your caregiver or dietician. This may reduce symptoms of diverticulosis.   Drink at least 6 to 8 glasses of water each day to prevent constipation.   Try not to strain when you have a bowel movement.   Avoiding nuts and seeds to prevent complications is NOT NECESSARY.   FOODS HAVING HIGH FIBER CONTENT INCLUDE:  Fruits. Apple, peach, pear, tangerine, raisins, prunes.   Vegetables. Brussels sprouts, asparagus, broccoli,  cabbage, carrot, cauliflower, romaine lettuce, spinach, summer squash, tomato, winter squash, zucchini.   Starchy Vegetables. Baked beans, kidney beans, lima beans, split peas, lentils, potatoes (with skin).   Grains. Whole wheat bread, brown rice, bran flake cereal, plain oatmeal, white rice, shredded wheat, bran muffins.   SEEK IMMEDIATE MEDICAL CARE IF:  You develop increasing pain or severe bloating.   You have an oral temperature above 101F.   You develop vomiting or bowel movements that are bloody or black.   Lifestyle and home remedies TO MANAGE REFLUX/CHEST PAIN  You may eliminate or reduce the frequency of heartburn by making the following lifestyle changes:   Control your weight. Being overweight is a major risk factor for heartburn and GERD. Excess pounds put pressure on your abdomen, pushing up your stomach and causing acid to back up into your esophagus.    Eat smaller meals. 4 TO 6 MEALS A DAY. This reduces pressure on the  lower esophageal sphincter, helping to prevent the valve from opening and acid from washing back into your esophagus.    Loosen your belt. Clothes that fit tightly around your waist put pressure on your abdomen and the lower esophageal sphincter.    Eliminate heartburn triggers. Everyone has specific triggers. Common triggers such as fatty or fried foods, spicy food, tomato sauce, carbonated beverages, alcohol, chocolate, mint, garlic, onion, caffeine and nicotine may make heartburn worse.    Avoid stooping or bending. Tying your shoes is OK. Bending over for longer periods to weed your garden isn't, especially soon after eating.    Don't lie down after a meal. Wait at least three to four hours after eating before going to bed, and don't lie down right after eating.    PUT THE HEAD OF YOUR BED ON 6 INCH BLOCKS.   Alternative medicine  Several home remedies exist for treating GERD, but they provide only temporary relief. They include  drinking baking soda (sodium bicarbonate) added to water or drinking other fluids such as baking soda mixed with cream of tartar and water.   Although these liquids create temporary relief by neutralizing, washing away or buffering acids, eventually they aggravate the situation by adding gas and fluid to your stomach, increasing pressure and causing more acid reflux. Further, adding more sodium to your diet may increase your blood pressure and add stress to your heart, and excessive bicarbonate ingestion can alter the acid-base balance in your body.

## 2017-04-23 ENCOUNTER — Encounter (HOSPITAL_COMMUNITY): Payer: Self-pay | Admitting: Gastroenterology

## 2017-04-26 ENCOUNTER — Other Ambulatory Visit: Payer: Self-pay | Admitting: Family Medicine

## 2017-04-28 NOTE — Telephone Encounter (Signed)
Seen [phone] 

## 2017-05-07 ENCOUNTER — Telehealth: Payer: Self-pay | Admitting: Gastroenterology

## 2017-05-07 NOTE — Telephone Encounter (Signed)
Please call pt. She had TWO simple adenomas and two Hyperplastic polyps removed from her colon. FOLLOW A High fiber diet. Next TCS in 5 years.

## 2017-05-08 NOTE — Telephone Encounter (Signed)
ON RECALL  °

## 2017-05-08 NOTE — Telephone Encounter (Signed)
Tried to call pt, no answer, LMOVM for return call.  

## 2017-05-08 NOTE — Telephone Encounter (Signed)
Pt called office and LMOVM. Called her back and informed of results.

## 2017-06-02 ENCOUNTER — Encounter: Payer: BLUE CROSS/BLUE SHIELD | Admitting: Family Medicine

## 2017-06-17 DIAGNOSIS — G43019 Migraine without aura, intractable, without status migrainosus: Secondary | ICD-10-CM | POA: Diagnosis not present

## 2017-06-17 DIAGNOSIS — M542 Cervicalgia: Secondary | ICD-10-CM | POA: Diagnosis not present

## 2017-06-17 DIAGNOSIS — M791 Myalgia, unspecified site: Secondary | ICD-10-CM | POA: Diagnosis not present

## 2017-06-17 DIAGNOSIS — G518 Other disorders of facial nerve: Secondary | ICD-10-CM | POA: Diagnosis not present

## 2017-06-17 DIAGNOSIS — R51 Headache: Secondary | ICD-10-CM | POA: Diagnosis not present

## 2017-08-12 ENCOUNTER — Encounter: Payer: BLUE CROSS/BLUE SHIELD | Admitting: Family Medicine

## 2017-08-16 ENCOUNTER — Other Ambulatory Visit: Payer: Self-pay

## 2017-08-16 ENCOUNTER — Emergency Department (HOSPITAL_COMMUNITY)
Admission: EM | Admit: 2017-08-16 | Discharge: 2017-08-16 | Disposition: A | Discharge status: Home / Self Care | Payer: BLUE CROSS/BLUE SHIELD | Attending: Emergency Medicine | Admitting: Emergency Medicine

## 2017-08-16 ENCOUNTER — Encounter (HOSPITAL_COMMUNITY): Payer: Self-pay

## 2017-08-16 ENCOUNTER — Emergency Department (HOSPITAL_COMMUNITY): Payer: BLUE CROSS/BLUE SHIELD

## 2017-08-16 DIAGNOSIS — R6889 Other general symptoms and signs: Secondary | ICD-10-CM | POA: Diagnosis not present

## 2017-08-16 DIAGNOSIS — R7989 Other specified abnormal findings of blood chemistry: Secondary | ICD-10-CM

## 2017-08-16 DIAGNOSIS — Z79899 Other long term (current) drug therapy: Secondary | ICD-10-CM | POA: Insufficient documentation

## 2017-08-16 DIAGNOSIS — R748 Abnormal levels of other serum enzymes: Secondary | ICD-10-CM | POA: Diagnosis not present

## 2017-08-16 DIAGNOSIS — J111 Influenza due to unidentified influenza virus with other respiratory manifestations: Secondary | ICD-10-CM | POA: Diagnosis not present

## 2017-08-16 DIAGNOSIS — R05 Cough: Secondary | ICD-10-CM | POA: Diagnosis not present

## 2017-08-16 DIAGNOSIS — R197 Diarrhea, unspecified: Secondary | ICD-10-CM | POA: Diagnosis not present

## 2017-08-16 LAB — COMPREHENSIVE METABOLIC PANEL WITH GFR
ALT: 17 U/L (ref 14–54)
AST: 17 U/L (ref 15–41)
Albumin: 3.9 g/dL (ref 3.5–5.0)
Alkaline Phosphatase: 174 U/L — ABNORMAL HIGH (ref 38–126)
Anion gap: 11 (ref 5–15)
BUN: 17 mg/dL (ref 6–20)
CO2: 20 mmol/L — ABNORMAL LOW (ref 22–32)
Calcium: 9 mg/dL (ref 8.9–10.3)
Chloride: 111 mmol/L (ref 101–111)
Creatinine, Ser: 1.12 mg/dL — ABNORMAL HIGH (ref 0.44–1.00)
GFR calc Af Amer: >60 mL/min
GFR calc non Af Amer: 54 mL/min — ABNORMAL LOW
Glucose, Bld: 110 mg/dL — ABNORMAL HIGH (ref 65–99)
Potassium: 3.6 mmol/L (ref 3.5–5.1)
Sodium: 142 mmol/L (ref 135–145)
Total Bilirubin: 0.5 mg/dL (ref 0.3–1.2)
Total Protein: 7.1 g/dL (ref 6.5–8.1)

## 2017-08-16 LAB — CBC
HCT: 41.2 % (ref 36.0–46.0)
Hemoglobin: 13.2 g/dL (ref 12.0–15.0)
MCH: 28.9 pg (ref 26.0–34.0)
MCHC: 32 g/dL (ref 30.0–36.0)
MCV: 90.4 fL (ref 78.0–100.0)
Platelets: 233 10*3/uL (ref 150–400)
RBC: 4.56 MIL/uL (ref 3.87–5.11)
RDW: 12.7 % (ref 11.5–15.5)
WBC: 5.9 10*3/uL (ref 4.0–10.5)

## 2017-08-16 LAB — URINALYSIS, ROUTINE W REFLEX MICROSCOPIC
Bilirubin Urine: NEGATIVE
Glucose, UA: NEGATIVE mg/dL
Hgb urine dipstick: NEGATIVE
Ketones, ur: NEGATIVE mg/dL
Leukocytes, UA: NEGATIVE
Nitrite: NEGATIVE
Protein, ur: NEGATIVE mg/dL
Specific Gravity, Urine: 1.024 (ref 1.005–1.030)
pH: 6 (ref 5.0–8.0)

## 2017-08-16 LAB — INFLUENZA PANEL BY PCR (TYPE A & B)
Influenza A By PCR: NEGATIVE
Influenza B By PCR: NEGATIVE

## 2017-08-16 LAB — LIPASE, BLOOD: Lipase: 25 U/L (ref 11–51)

## 2017-08-16 MED ORDER — ACETAMINOPHEN 325 MG PO TABS
650.0000 mg | ORAL_TABLET | Freq: Once | ORAL | Status: AC
  Administered 2017-08-16: 650 mg via ORAL
  Filled 2017-08-16: qty 2

## 2017-08-16 MED ORDER — DM-GUAIFENESIN ER 30-600 MG PO TB12: 1.0000 | ORAL_TABLET | Freq: Two times a day (BID) | ORAL | 0 refills | Status: DC

## 2017-08-16 MED ORDER — ONDANSETRON 4 MG PO TBDP
4.0000 mg | ORAL_TABLET | Freq: Once | ORAL | Status: AC
  Administered 2017-08-16: 4 mg via ORAL
  Filled 2017-08-16: qty 1

## 2017-08-16 MED ORDER — ACETAMINOPHEN 325 MG PO TABS: 650.0000 mg | ORAL_TABLET | Freq: Four times a day (QID) | ORAL | 0 refills | Status: DC | PRN

## 2017-08-16 MED ORDER — ONDANSETRON HCL 4 MG PO TABS: 4.0000 mg | ORAL_TABLET | Freq: Three times a day (TID) | ORAL | 0 refills | Status: DC | PRN

## 2017-08-16 NOTE — ED Provider Notes (Signed)
[facility] EMERGENCY DEPARTMENT Provider Note   CSN: [id number] Arrival date & time: 08/16/17  1338     History   Chief Complaint Chief Complaint  Patient presents with  . Cough  . Diarrhea    HPI Latoya Decker is a 57 y.o. female.  HPI  57 year old female presents the ED today complaining of a productive cough with green sputum, runny nose, congestion, fevers (Tmax 102F), chills, body aches, fatigue, headaches, and a sore throat that began 2 days ago.  Is also complaining of nausea watery diarrhea.  States she has had 3-4 episodes of diarrhea today.  Denies any blood in stool.  Denies any abdominal pain or vomiting.  Patient states that her headache has resolved she does not currently have one.  States that prior headaches over the last 2 days were consistent with her normal headaches.  She has a history of migraines and pain resolved with normal migraine medication.  No photophobia or visual changes.  Denies neck stiffness or visual changes.  Has been taking Mucinex for her symptoms with no relief.  Past Medical History:  Diagnosis Date  . Anxiety   . Depression   . Duodenal ulcer 01/05/2010   EGD Dr Dierdre Searles, h pylori gastritis, completed treatment  . Duodenitis   . Eosinophilic gastroenteritis JAN 2015   [place] 2014: NL CMP/GIARDIA Ag  . Family hx of colon cancer    Mother & sister  . Gastritis    hospitalized 6/23-6/25 2011   . Headache(784.0)    migraines  . Helicobacter pylori gastritis   . Hypertension   . PUD (peptic ulcer disease)    Dr Tamala Julian  . S/P colonoscopy 12/27/2003   Dr Voncille Lo  . S/P endoscopy 12/27/2003   Dr [facility] Service superficial ulcers GEJ, duodenitis,gastritis    Patient Active Problem List   Diagnosis Date Noted  . Gastritis due to nonsteroidal anti-inflammatory drug   . GERD (gastroesophageal reflux disease) 03/14/2017  . Goiter 01/20/2017  . Sore throat 01/20/2017  . Odynophagia 01/20/2017  . Alkaline phosphatase elevation 10/16/2016    . Nausea 10/15/2016  . Prediabetes 05/01/2014  . Allergic rhinitis 11/24/2013  . Insomnia 07/01/2013  . Migraine variant without intractability 08/31/2012  . Family history of colon cancer 05/14/2011  . Duodenal ulcer without hemorrhage or perforation and without obstruction 01/12/2010  . Obesity (BMI 30.0-34.9) 03/30/2008    Past Surgical History:  Procedure Laterality Date  . ABDOMINAL HYSTERECTOMY    . bilateral tubal ligation  1989  . BIOPSY  06/10/2011   SLF: mild gastritis, bx benign, sb bx benign   . CHOLECYSTECTOMY  1997   aph-Smith  . COLONOSCOPY  05/2011   SLF: multiple hyperplastic sessile polyps, scattered diverticulosis. next TCS 05/2016 ([facility])  . COLONOSCOPY N/A 04/21/2017   Procedure: COLONOSCOPY;  Surgeon: Danie Binder, MD;  Location: [facility] ENDO SUITE;  Service: Endoscopy;  Laterality: N/A;  1000  . ESOPHAGOGASTRODUODENOSCOPY N/A 9/56/2130   EOS INOPHILIC GASTROENTERITIS  . ESOPHAGOGASTRODUODENOSCOPY N/A 04/21/2017   Procedure: ESOPHAGOGASTRODUODENOSCOPY (EGD);  Surgeon: Danie Binder, MD;  Location: [facility] ENDO SUITE;  Service: Endoscopy;  Laterality: N/A;  . PARTIAL HYSTERECTOMY  2007   1 ovary removed  . POLYPECTOMY  06/10/2011   Procedure: POLYPECTOMY;  Surgeon: Dorothyann Peng, MD;  Location: [facility] ORS;  Service: Endoscopy;;  Cecal polypectomy; Sigmoid colon polypectomies  . TUBAL LIGATION      OB History    Gravida Para Term Preterm AB Living   3  3   SAB TAB Ectopic Multiple Live Births                   Home Medications    Prior to Admission medications   Medication Sig Start Date End Date Taking? Authorizing Provider  acetaminophen (TYLENOL) 325 MG tablet Take 2 tablets (650 mg total) by mouth every 6 (six) hours as needed. 08/16/17   Jina Olenick S, PA-C  baclofen (LIORESAL) 10 MG tablet One tablet twice daily if needed for migraine headache. Maximum use is twice monthly Patient taking differently: Take 10 mg by mouth 2 (two) times daily  as needed (for migraine.). One tablet twice daily if needed for migraine headache. Maximum use is twice monthly 10/15/16   Fayrene Helper, MD  dextromethorphan-guaiFENesin [facility] DM) 30-600 MG 12hr tablet Take 1 tablet by mouth 2 (two) times daily. 08/16/17   Jakiya Bookbinder S, PA-C  fluticasone (FLONASE) 50 MCG/ACT nasal spray Place 1-2 sprays into both nostrils daily as needed for allergies.    [provider]  lansoprazole (PREVACID) 30 MG capsule 1 PO 30 MINS BEFORE YOUR FIRST MEAL EVERY DAY 04/21/17   Fields, Marga Melnick, MD  ondansetron (ZOFRAN) 4 MG tablet Take 1 tablet (4 mg total) by mouth every 8 (eight) hours as needed for nausea or vomiting. 08/16/17   Donyea Beverlin S, PA-C  topiramate (TOPAMAX) 25 MG tablet Take 100 mg by mouth at bedtime.    [provider]  traZODone (DESYREL) 50 MG tablet TAKE 1 TABLET(50 MG) BY MOUTH AT BEDTIME AS NEEDED FOR SLEEP 04/28/17   Fayrene Helper, MD    Family History Family History  Problem Relation Age of Onset  . Colon cancer Mother 76  . Diabetes Mother   . Hypertension Mother   . Stroke Mother   . Heart attack Father   . Diabetes Father   . Hypertension Father   . Colon cancer Sister 10  . Lupus Brother   . Parkinsonism Brother   . Anesthesia problems Neg Hx   . Hypotension Neg Hx   . Malignant hyperthermia Neg Hx   . Pseudochol deficiency Neg Hx     Social History Social History   Tobacco Use  . Smoking status: Never Smoker  . Smokeless tobacco: Never Used  . Tobacco comment: Never smoker  Substance Use Topics  . Alcohol use: No  . Drug use: No     Allergies   Codeine; Hydrocodone; and Lidocaine viscous   Review of Systems Review of Systems  Constitutional: Positive for chills, fatigue and fever.  HENT: Positive for congestion, rhinorrhea and sore throat. Negative for ear discharge, ear pain, sinus pressure, sinus pain, trouble swallowing and voice change.   Respiratory: Positive for cough.  Negative for shortness of breath and wheezing.   Cardiovascular: Negative for chest pain and leg swelling.  Gastrointestinal: Positive for diarrhea and nausea. Negative for abdominal pain, blood in stool, constipation and vomiting.  Genitourinary: Negative for flank pain, frequency, hematuria and urgency.  Musculoskeletal: Positive for myalgias. Negative for neck stiffness.  Skin: Negative for color change and rash.  Neurological: Positive for headaches. Negative for dizziness, weakness, light-headedness and numbness.     Physical Exam Updated Vital Signs BP 127/78   Pulse 86   Temp 98.7 F (37.1 C) (Oral)   Resp 16   Ht 5\' 6"  (1.676 m)   Wt 88 kg (194 lb)   SpO2 96%   BMI 31.31 kg/m   Physical Exam  Constitutional: She is oriented to person, place, and time. She appears well-developed and well-nourished. No distress.  No acute distress  HENT:  Head: Normocephalic and atraumatic.  Right Ear: External ear normal.  Left Ear: External ear normal.  Bilateral TMs have fluid behind them, however are not erythematous or bulging.  No pharyngeal erythema or exudates noted.  No tonsillar swelling or exudates.  No evidence of peritonsillar abscess.  No hoarse voice.  Patient tolerating secretions.  Bilateral nasal turbinates are boggy.  Eyes: Conjunctivae and EOM are normal. Pupils are equal, round, and reactive to light.  Neck: Normal range of motion. Neck supple.  No nuchal rigidity.  Patient has full range of motion is able to fully flex neck to her chest.   Cardiovascular: Normal rate, regular rhythm, normal heart sounds and intact distal pulses.  No murmur heard. Pulmonary/Chest: Effort normal and breath sounds normal. No stridor. No respiratory distress. She has no wheezes. She has no rales.  Abdominal: Soft. She exhibits no distension. There is no tenderness. There is no rebound and no guarding.  Hyperactive bowel sounds.  No rigidity.  Musculoskeletal: Normal range of motion. She  exhibits no edema.  Neurological: She is alert and oriented to person, place, and time. No sensory deficit. She exhibits normal muscle tone.  Skin: Skin is warm and dry. Capillary refill takes less than 2 seconds.  Psychiatric: She has a normal mood and affect.  Nursing note and vitals reviewed.    ED Treatments / Results  Labs (all labs ordered are listed, but only abnormal results are displayed) Labs Reviewed  COMPREHENSIVE METABOLIC PANEL - Abnormal; Notable for the following components:      Result Value   CO2 20 (*)    Glucose, Bld 110 (*)    Creatinine, Ser 1.12 (*)    Alkaline Phosphatase 174 (*)    GFR calc non Af Amer 54 (*)    All other components within normal limits  LIPASE, BLOOD  CBC  URINALYSIS, ROUTINE W REFLEX MICROSCOPIC  INFLUENZA PANEL BY PCR (TYPE A & B)    EKG  EKG Interpretation None       Radiology Dg Chest 2 View  Result Date: 08/16/2017 CLINICAL DATA:  Cough, congestion, sore throat EXAM: CHEST  2 VIEW COMPARISON:  10/25/2016 FINDINGS: Lungs are clear.  No pleural effusion or pneumothorax. The heart is normal in size. Visualized osseous structures are within normal limits. IMPRESSION: Normal chest radiographs. Electronically Signed   By: Julian Hy M.D.   On: 08/16/2017 16:11    Procedures Procedures (including critical care time)  Medications Ordered in ED Medications  ondansetron (ZOFRAN-ODT) disintegrating tablet 4 mg (4 mg Oral Given 08/16/17 1459)  acetaminophen (TYLENOL) tablet 650 mg (650 mg Oral Given 08/16/17 1549)     Initial Impression / Assessment and Plan / ED Course  I have reviewed the triage vital signs and the nursing notes.  Pertinent labs & imaging results that were available during my care of the patient were reviewed by me and considered in my medical decision making (see chart for details).    Evaluated patient in the ED.  She is in no acute distress.  She is afebrile with normal vital signs.  Satting on room air  97%.  Heart rate 78.  Labs ordered as well as influenza and UA.  Patient requesting water.  Tolerating p.o.  Discussed pt presentation, exam findings, and results of labs and imaging with Dr. Alvino Chapel, who agrees with the plan  to discharge patient with symptomatic treatment and outpatient follow up.  Rechecked pt. Laying in bed in NAD. Has had no episodes of diarrhea in the ED. Repeat abdominal exam benign. Discussed all results with patient and advised her to follow up with her primary care doctor about all results. Advised f/u within 5-7 days. Return precautions given for dehydration, persistent diarrhea, blood in stool, abdominal pain, or any new or worsening symptoms.   Final Clinical Impressions(s) / ED Diagnoses   Final diagnoses:  Flu-like symptoms  Diarrhea, unspecified type  Elevated serum creatinine  Elevated alkaline phosphatase measurement   Patient with symptoms consistent with influenza.  Vitals are stable, no fevers.  No signs of dehydration, tolerating PO's >6 0z.  Lungs are clear. CXR was ordered and was negative. Flu was negative as well, however symptoms still consistent with flu like illness.  Patient will be discharged with instructions to orally hydrate, rest, and use over-the-counter medications such as anti-inflammatories ibuprofen and Aleve for muscle aches and Tylenol for fever.  Patient will also be given mucinex.   Patient also with symptoms of diarrhea.  Vitals are stable, no fever. Cbc with no elevated WBC and with normal hgb. CMP with normal live electrolytes and liver enzymes. Slight increase in creatinine that is felt to be due to dehydration. Lipase negative. Ua negative.  No abdominal pain initially or with repeat abdominal exam, no concern for appendicitis, cholecystitis, pancreatitis, diverticulitis, ruptured viscus, UTI, kidney stone, or any other abdominal etiology.  Supportive therapy indicated with return if symptoms worsen.  Patient counseled.   ED  Discharge Orders        Ordered    ondansetron (ZOFRAN) 4 MG tablet  Every 8 hours PRN     08/16/17 1630    dextromethorphan-guaiFENesin (MUCINEX DM) 30-600 MG 12hr tablet  2 times daily     08/16/17 1631    acetaminophen (TYLENOL) 325 MG tablet  Every 6 hours PRN     08/16/17 1631       Imanuel Pruiett S, PA-C 08/16/17 1807    Davonna Belling, MD 08/16/17 [phone]

## 2017-08-16 NOTE — ED Triage Notes (Addendum)
Reports of productive cough, fever and chills since yesterday. Started Mucinex yesterday. Also reports of diarrhea since this morning.

## 2017-08-18 ENCOUNTER — Encounter: Payer: Self-pay | Admitting: Family Medicine

## 2017-08-18 ENCOUNTER — Ambulatory Visit: Payer: BLUE CROSS/BLUE SHIELD | Admitting: Family Medicine

## 2017-08-18 VITALS — BP 122/80 | HR 83 | Temp 98.3°F | Resp 16 | Ht 65.0 in | Wt 195.0 lb

## 2017-08-18 DIAGNOSIS — J209 Acute bronchitis, unspecified: Secondary | ICD-10-CM | POA: Diagnosis not present

## 2017-08-18 DIAGNOSIS — E669 Obesity, unspecified: Secondary | ICD-10-CM | POA: Diagnosis not present

## 2017-08-18 DIAGNOSIS — J320 Chronic maxillary sinusitis: Secondary | ICD-10-CM

## 2017-08-18 DIAGNOSIS — J0100 Acute maxillary sinusitis, unspecified: Secondary | ICD-10-CM | POA: Insufficient documentation

## 2017-08-18 DIAGNOSIS — G43809 Other migraine, not intractable, without status migrainosus: Secondary | ICD-10-CM

## 2017-08-18 DIAGNOSIS — H6692 Otitis media, unspecified, left ear: Secondary | ICD-10-CM

## 2017-08-18 MED ORDER — PENICILLIN V POTASSIUM 500 MG PO TABS: 500.0000 mg | ORAL_TABLET | Freq: Three times a day (TID) | ORAL | 0 refills | Status: DC

## 2017-08-18 MED ORDER — BENZONATATE 100 MG PO CAPS: 100.0000 mg | ORAL_CAPSULE | Freq: Two times a day (BID) | ORAL | 0 refills | Status: DC | PRN

## 2017-08-18 MED ORDER — PSEUDOEPHEDRINE HCL ER 120 MG PO TB12: 120.0000 mg | ORAL_TABLET | Freq: Two times a day (BID) | ORAL | 0 refills | Status: DC | PRN

## 2017-08-18 MED ORDER — PROMETHAZINE-DM 6.25-15 MG/5ML PO SYRP: ORAL_SOLUTION | ORAL | 0 refills | Status: DC

## 2017-08-18 MED ORDER — CEFTRIAXONE SODIUM 500 MG IJ SOLR
500.0000 mg | Freq: Once | INTRAMUSCULAR | Status: AC
  Administered 2017-08-18: 500 mg via INTRAMUSCULAR

## 2017-08-18 NOTE — Patient Instructions (Addendum)
Physical exam and pap in March, call if you need me sooner  You are treated for acute sinusitis, bronchitis and left otitis media  Rocephin 500 mg IM in office  Penicilin 500 mg x 10 days, tessalon perles, phenergan DM and sudafed are prescribed  Work excuse from today to return on 08/21/2017

## 2017-08-18 NOTE — Progress Notes (Signed)
   Latoya Decker     MRN: [id number]      DOB: 1960-10-10   HPI [name] is here for follow up of recent ED visit , on 02/2.2019, she had acute onset of sore throat , body aches, fever to 104 reportedly and chills , on 02/01, was seen on 02/02 and was evaluated in the ED Here today still symptomatic, coughing up green  Sputum, fever last was on - 02/01, now has green nasal drainage Appetite is poor had loose stool 2 days ago over 5 , none since then, no nausea  since Saturday C/o left ear pain x 2 days ROS See HPI  Denies chest pains, palpitations and leg swelling Denies dysuria, frequency, hesitancy or incontinence. Denies joint pain, swelling and limitation in mobility. Denies  seizures, numbness, or tingling. Denies depression, anxiety or insomnia. Denies skin break down or rash.   PE  BP 122/80   Pulse 83   Temp 98.3 F (36.8 C) (Oral)   Resp 16   Ht 5\' 5"  (1.651 m)   Wt 195 lb (88.5 kg)   SpO2 97%   BMI 32.45 kg/m   Patient alert and oriented , Ill appearing HEENT: No facial asymmetry, EOMI,   oropharynx pink and moist.  Neck supple no JVD, no mass.Left maxillary sinus tender, left TM dull and erythematous  Chest: decreased air entry , scarttered crackles no wheezes  CVS: S1, S2 no murmurs, no S3.Regular rate.  ABD: Soft non tender.   Ext: No edema  MS: Adequate ROM spine, shoulders, hips and knees.  Skin: Intact, no ulcerations or rash noted.  Psych: Good eye contact, normal affect. Memory intact not anxious or depressed appearing.  CNS: CN 2-12 intact, power,  normal throughout.no focal deficits noted.   Assessment & Plan  Acute bronchitis Rocephin 500 mg im followed by course of penicillin and tessalon perles.Cough suppressamnt as needed, for bedtime use Work excuse to return 08/21/2017  Acute left otitis media Pen V prescribed  Maxillary sinusitis, acute Rocephin 500 mg IM and  Oral penicillin prescribed  Obesity (BMI  30.0-34.9) Unchanged. Patient re-educated about  the importance of commitment to a  minimum of 150 minutes of exercise per week.  The importance of healthy food choices with portion control discussed. Encouraged to start a food diary, count calories and to consider  joining a support group. Sample diet sheets offered. Goals set by the patient for the next several months.   Weight /BMI 08/18/2017 08/16/2017 04/21/2017  WEIGHT 195 lb 194 lb 194 lb  HEIGHT 5\' 5"  5\' 6"  5\' 6"   BMI 32.45 kg/m2 31.31 kg/m2 31.31 kg/m2      Migraine variant without intractability Controlled, no change in medication

## 2017-08-22 ENCOUNTER — Ambulatory Visit: Payer: BLUE CROSS/BLUE SHIELD | Admitting: Gastroenterology

## 2017-08-24 NOTE — Assessment & Plan Note (Signed)
Controlled, no change in medication  

## 2017-08-24 NOTE — Assessment & Plan Note (Signed)
Unchanged. Patient re-educated about  the importance of commitment to a  minimum of 150 minutes of exercise per week.  The importance of healthy food choices with portion control discussed. Encouraged to start a food diary, count calories and to consider  joining a support group. Sample diet sheets offered. Goals set by the patient for the next several months.   Weight /BMI 08/18/2017 08/16/2017 04/21/2017  WEIGHT 195 lb 194 lb 194 lb  HEIGHT 5\' 5"  5\' 6"  5\' 6"   BMI 32.45 kg/m2 31.31 kg/m2 31.31 kg/m2

## 2017-08-24 NOTE — Assessment & Plan Note (Signed)
Rocephin 500 mg IM and  Oral penicillin prescribed

## 2017-08-24 NOTE — Assessment & Plan Note (Signed)
Pen V prescribed 

## 2017-08-24 NOTE — Assessment & Plan Note (Addendum)
Rocephin 500 mg im followed by course of penicillin and tessalon perles.Cough suppressamnt as needed, for bedtime use Work excuse to return 08/21/2017

## 2017-09-23 ENCOUNTER — Other Ambulatory Visit: Payer: Self-pay | Admitting: Family Medicine

## 2017-09-29 ENCOUNTER — Other Ambulatory Visit: Payer: Self-pay | Admitting: Family Medicine

## 2017-09-29 DIAGNOSIS — Z1231 Encounter for screening mammogram for malignant neoplasm of breast: Secondary | ICD-10-CM

## 2017-09-30 ENCOUNTER — Encounter: Payer: BLUE CROSS/BLUE SHIELD | Admitting: Family Medicine

## 2017-10-06 ENCOUNTER — Ambulatory Visit (HOSPITAL_COMMUNITY): Payer: Self-pay

## 2017-10-09 ENCOUNTER — Ambulatory Visit (HOSPITAL_COMMUNITY)
Admission: RE | Admit: 2017-10-09 | Discharge: 2017-10-09 | Disposition: A | Discharge status: Home / Self Care | Payer: BLUE CROSS/BLUE SHIELD | Source: Ambulatory Visit | Attending: Family Medicine | Admitting: Family Medicine

## 2017-10-09 DIAGNOSIS — Z1231 Encounter for screening mammogram for malignant neoplasm of breast: Secondary | ICD-10-CM | POA: Diagnosis present

## 2017-10-10 ENCOUNTER — Other Ambulatory Visit: Payer: Self-pay | Admitting: Family Medicine

## 2017-10-17 ENCOUNTER — Telehealth: Payer: Self-pay | Admitting: Orthopedic Surgery

## 2017-10-17 ENCOUNTER — Emergency Department (HOSPITAL_COMMUNITY)
Admission: EM | Admit: 2017-10-17 | Discharge: 2017-10-17 | Disposition: A | Discharge status: Home / Self Care | Payer: BLUE CROSS/BLUE SHIELD | Attending: Emergency Medicine | Admitting: Emergency Medicine

## 2017-10-17 ENCOUNTER — Other Ambulatory Visit: Payer: Self-pay

## 2017-10-17 ENCOUNTER — Emergency Department (HOSPITAL_COMMUNITY): Payer: BLUE CROSS/BLUE SHIELD

## 2017-10-17 ENCOUNTER — Encounter (HOSPITAL_COMMUNITY): Payer: Self-pay | Admitting: Emergency Medicine

## 2017-10-17 DIAGNOSIS — Y999 Unspecified external cause status: Secondary | ICD-10-CM | POA: Insufficient documentation

## 2017-10-17 DIAGNOSIS — Y929 Unspecified place or not applicable: Secondary | ICD-10-CM | POA: Insufficient documentation

## 2017-10-17 DIAGNOSIS — S6991XA Unspecified injury of right wrist, hand and finger(s), initial encounter: Secondary | ICD-10-CM | POA: Diagnosis present

## 2017-10-17 DIAGNOSIS — S62521A Displaced fracture of distal phalanx of right thumb, initial encounter for closed fracture: Secondary | ICD-10-CM | POA: Insufficient documentation

## 2017-10-17 DIAGNOSIS — Z79899 Other long term (current) drug therapy: Secondary | ICD-10-CM | POA: Diagnosis not present

## 2017-10-17 DIAGNOSIS — I10 Essential (primary) hypertension: Secondary | ICD-10-CM | POA: Insufficient documentation

## 2017-10-17 DIAGNOSIS — Y939 Activity, unspecified: Secondary | ICD-10-CM | POA: Insufficient documentation

## 2017-10-17 DIAGNOSIS — W228XXA Striking against or struck by other objects, initial encounter: Secondary | ICD-10-CM | POA: Insufficient documentation

## 2017-10-17 MED ORDER — CEPHALEXIN 500 MG PO CAPS: 500.0000 mg | ORAL_CAPSULE | Freq: Four times a day (QID) | ORAL | 0 refills | Status: AC

## 2017-10-17 MED ORDER — TRAMADOL HCL 50 MG PO TABS: 50.0000 mg | ORAL_TABLET | Freq: Two times a day (BID) | ORAL | 0 refills | Status: DC | PRN

## 2017-10-17 NOTE — Telephone Encounter (Signed)
Patient called to relay that she was treated at [facility] Emergency room today, Friday, 10/17/17, for injury to right thumb - indicates displaced fracture. Offered appointment for Monday, 10/20/17 as our clinic is closed as of 12:00noon.  States she is leaving for vacation Sunday, 4/7, through 10/23/17.  She may try to contact orthopaedics office in [place], and will let us know.

## 2017-10-17 NOTE — Discharge Instructions (Signed)
Please take all of your antibiotics until finished!   You may develop abdominal discomfort or diarrhea from the antibiotic.  You may help offset this with probiotics which you can buy or get in yogurt. Do not eat  or take the probiotics until 2 hours after your antibiotic.   Take [phone] mg of Tylenol every 6 hours as needed for pain.  Do not exceed 4000 mg of Tylenol daily.  You may take tramadol as needed for severe pain but do not drive, drink alcohol, or operate heavy machinery while on this medication as it may make you drowsy.  Keep the splint on at all times although you may take off for showers.  Follow-up with Dr. Aline Brochure or orthopedist for reevaluation of your symptoms.  Return to the emergency department if any concerning signs or symptoms develop such as severe swelling, redness, or abnormal drainage.

## 2017-10-17 NOTE — ED Provider Notes (Signed)
[facility] EMERGENCY DEPARTMENT Provider Note   CSN: [id number] Arrival date & time: 10/17/17  5916     History   Chief Complaint Chief Complaint  Patient presents with  . Finger Injury    HPI Latoya Decker is a 57 y.o. female with history of anxiety, duodenal ulcer/gastritis, HTN presents today for evaluation of acute onset, constant right thumb pain after injury earlier this morning.  She states that she accidentally slammed at the distal aspect of her right thumb in a car door.  She notes a little bit of bleeding from the nail fold but this is well controlled and she is not on blood thinners.  She notes a constant sharp throbbing pain from the distal aspect of the thumb that radiates "all over the right hand ".  She denies numbness or tingling.  Pain worsens with bending and palpation.  She has acrylic nails on and she states she thinks that she may have pulled the nail out somewhat.  The history is provided by the patient.    Past Medical History:  Diagnosis Date  . Anxiety   . Depression   . Duodenal ulcer 01/05/2010   EGD Dr Dierdre Searles, h pylori gastritis, completed treatment  . Duodenitis   . Eosinophilic gastroenteritis JAN 2015   [place] 2014: NL CMP/GIARDIA Ag  . Family hx of colon cancer    Mother & sister  . Gastritis    hospitalized 6/23-6/25 2011   . Headache(784.0)    migraines  . Helicobacter pylori gastritis   . Hypertension   . PUD (peptic ulcer disease)    Dr Tamala Julian  . S/P colonoscopy 12/27/2003   Dr Voncille Lo  . S/P endoscopy 12/27/2003   Dr [facility] Service superficial ulcers GEJ, duodenitis,gastritis    Patient Active Problem List   Diagnosis Date Noted  . Acute bronchitis 08/18/2017  . Acute left otitis media 08/18/2017  . Maxillary sinusitis, acute 08/18/2017  . Gastritis due to nonsteroidal anti-inflammatory drug   . GERD (gastroesophageal reflux disease) 03/14/2017  . Goiter 01/20/2017  . Sore throat 01/20/2017  . Odynophagia 01/20/2017  .  Alkaline phosphatase elevation 10/16/2016  . Nausea 10/15/2016  . Prediabetes 05/01/2014  . Allergic rhinitis 11/24/2013  . Insomnia 07/01/2013  . Migraine variant without intractability 08/31/2012  . Family history of colon cancer 05/14/2011  . Duodenal ulcer without hemorrhage or perforation and without obstruction 01/12/2010  . Obesity (BMI 30.0-34.9) 03/30/2008    Past Surgical History:  Procedure Laterality Date  . ABDOMINAL HYSTERECTOMY    . bilateral tubal ligation  1989  . BIOPSY  06/10/2011   SLF: mild gastritis, bx benign, sb bx benign   . CHOLECYSTECTOMY  1997   aph-Smith  . COLONOSCOPY  05/2011   SLF: multiple hyperplastic sessile polyps, scattered diverticulosis. next TCS 05/2016 ([facility])  . COLONOSCOPY N/A 04/21/2017   Procedure: COLONOSCOPY;  Surgeon: Danie Binder, MD;  Location: [facility] ENDO [facility];  Service: Endoscopy;  Laterality: N/A;  1000  . ESOPHAGOGASTRODUODENOSCOPY N/A 3/84/6659   EOS INOPHILIC GASTROENTERITIS  . ESOPHAGOGASTRODUODENOSCOPY N/A 04/21/2017   Procedure: ESOPHAGOGASTRODUODENOSCOPY (EGD);  Surgeon: Danie Binder, MD;  Location: [facility] ENDO SUITE;  Service: Endoscopy;  Laterality: N/A;  . PARTIAL HYSTERECTOMY  2007   1 ovary removed  . POLYPECTOMY  06/10/2011   Procedure: POLYPECTOMY;  Surgeon: Dorothyann Peng, MD;  Location: [facility] ORS;  Service: Endoscopy;;  Cecal polypectomy; Sigmoid colon polypectomies  . TUBAL LIGATION       OB History  Gravida  3   Para      Term      Preterm      AB      Living  3     SAB      TAB      Ectopic      Multiple      Live Births               Home Medications    Prior to Admission medications   Medication Sig Start Date End Date Taking? Authorizing Provider  acetaminophen (TYLENOL) 325 MG tablet Take 2 tablets (650 mg total) by mouth every 6 (six) hours as needed. 08/16/17  Yes Couture, Cortni S, PA-C  baclofen (LIORESAL) 10 MG tablet One tablet twice daily if needed for migraine  headache. Maximum use is twice monthly Patient taking differently: Take 10 mg by mouth 2 (two) times daily as needed (for migraine.). One tablet twice daily if needed for migraine headache. Maximum use is twice monthly 10/15/16  Yes Fayrene Helper, MD  fluticasone [facility]) 50 MCG/ACT nasal spray Place 1-2 sprays into both nostrils daily as needed for allergies.   Yes [provider]  lansoprazole (PREVACID) 30 MG capsule 1 PO 30 MINS BEFORE YOUR FIRST MEAL EVERY DAY 04/21/17  Yes Fields, Sandi L, MD  topiramate (TOPAMAX) 25 MG tablet Take 100 mg by mouth at bedtime.   Yes [provider]  traZODone (DESYREL) 50 MG tablet TAKE 1 TABLET(50 MG) BY MOUTH AT BEDTIME AS NEEDED FOR SLEEP 09/23/17  Yes Fayrene Helper, MD  benzonatate (TESSALON) 100 MG capsule Take 1 capsule (100 mg total) by mouth 2 (two) times daily as needed for cough. Patient not taking: Reported on 10/17/2017 08/18/17   Fayrene Helper, MD  cephALEXin (KEFLEX) 500 MG capsule Take 1 capsule (500 mg total) by mouth 4 (four) times daily for 3 days. 10/17/17 10/20/17  Rodell Perna A, PA-C  dextromethorphan-guaiFENesin (MUCINEX DM) 30-600 MG 12hr tablet Take 1 tablet by mouth 2 (two) times daily. Patient not taking: Reported on 10/17/2017 08/16/17   Couture, Cortni S, PA-C  ondansetron (ZOFRAN) 4 MG tablet Take 1 tablet (4 mg total) by mouth every 8 (eight) hours as needed for nausea or vomiting. Patient not taking: Reported on 10/17/2017 08/16/17   Couture, Cortni S, PA-C  penicillin v potassium (VEETID) 500 MG tablet Take 1 tablet (500 mg total) by mouth 3 (three) times daily. Patient not taking: Reported on 10/17/2017 08/18/17   Fayrene Helper, MD  promethazine-dextromethorphan (PROMETHAZINE-DM) 6.25-15 MG/5ML syrup One teaspoonat bedtime , as needed, for excessive cough Patient not taking: Reported on 10/17/2017 08/18/17   Fayrene Helper, MD  pseudoephedrine (SUDAFED 12 HOUR) 120 MG 12 hr tablet Take 1 tablet (120 mg  total) by mouth every 12 (twelve) hours as needed for congestion. Patient not taking: Reported on 10/17/2017 08/18/17   Fayrene Helper, MD  traMADol (ULTRAM) 50 MG tablet Take 1 tablet (50 mg total) by mouth every 12 (twelve) hours as needed for severe pain. 10/17/17   Renita Papa, PA-C    Family History Family History  Problem Relation Age of Onset  . Colon cancer Mother 66  . Diabetes Mother   . Hypertension Mother   . Stroke Mother   . Heart attack Father   . Diabetes Father   . Hypertension Father   . Colon cancer Sister 43  . Lupus Brother   . Parkinsonism Brother   .  Anesthesia problems Neg Hx   . Hypotension Neg Hx   . Malignant hyperthermia Neg Hx   . Pseudochol deficiency Neg Hx     Social History Social History   Tobacco Use  . Smoking status: Never Smoker  . Smokeless tobacco: Never Used  . Tobacco comment: Never smoker  Substance Use Topics  . Alcohol use: No  . Drug use: No     Allergies   Codeine; Hydrocodone; and Lidocaine viscous   Review of Systems Review of Systems  Constitutional: Negative for chills and fever.  Musculoskeletal: Positive for arthralgias.  Neurological: Negative for weakness and numbness.     Physical Exam Updated Vital Signs BP (!) 171/89 (BP Location: Left Arm)   Pulse 77   Temp 98.1 F (36.7 C) (Oral)   Resp 20   Ht 5\' 6"  (1.676 m)   Wt 88 kg (194 lb)   SpO2 99%   BMI 31.31 kg/m   Physical Exam  Constitutional: She appears well-developed and well-nourished. No distress.  HENT:  Head: Normocephalic and atraumatic.  Eyes: Conjunctivae are normal. Right eye exhibits no discharge. Left eye exhibits no discharge.  Neck: Normal range of motion. Neck supple. No JVD present. No tracheal deviation present.  Cardiovascular: Normal rate and intact distal pulses.  2+ DP/PT pulses bilaterally  Pulmonary/Chest: Effort normal.  Abdominal: She exhibits no distension.  Musculoskeletal: She exhibits tenderness. She exhibits  no edema.  Generalized tenderness to palpation of the right thumb, maximally tender distally.  No crepitus noted.  5/5 strength of right wrist and digits with flexion and extension against resistance.  Pain elicited with flexion extension of the distal phalanx of the right thumb.  There is a small amount of bleeding along the lateral nail fold of the thumb nail but the nail is firmly adhered in place.  No observable laceration or nail bed injury noted. No snuffbox tenderness.  Neurological: She is alert. No sensory deficit.  Fluent speech, no facial droop, sensation intact to soft touch of bilateral upper extremities. Good grip strength bilaterally.   Skin: Skin is warm and dry. Capillary refill takes less than 2 seconds. No erythema.  Psychiatric: She has a normal mood and affect. Her behavior is normal.  Nursing note and vitals reviewed.    ED Treatments / Results  Labs (all labs ordered are listed, but only abnormal results are displayed) Labs Reviewed - No data to display  EKG None  Radiology Dg Finger Thumb Right  Result Date: 10/17/2017 CLINICAL DATA:  Right thumb pain after car door injury. EXAM: RIGHT THUMB 2+V COMPARISON:  None. FINDINGS: Mildly displaced fracture is seen involving the first distal phalanx. Joint spaces are intact. No soft tissue abnormality is noted. IMPRESSION: Mildly displaced first distal phalangeal fracture. Electronically Signed   By: Marijo Conception, M.D.   On: 10/17/2017 10:08    Procedures Procedures (including critical care time)  Medications Ordered in ED Medications - No data to display   Initial Impression / Assessment and Plan / ED Course  I have reviewed the triage vital signs and the nursing notes.  Pertinent labs & imaging results that were available during my care of the patient were reviewed by me and considered in my medical decision making (see chart for details).     Patient with right thumb pain after slamming the thumb in a car  door.  She is afebrile, mildly hypertensive but has a history of hypertension and is currently in pain. She is neurovascularly  intact.  Small amount of bleeding around the lateral nail fold but the nail plate appears to be firmly in place in the nail bed.  Radiographs reviewed by me show a mildly displaced first distal phalangeal fracture.  No concern for tendon injury and strength is intact.  No evidence of secondary skin infection.  Will discharge with short course of antibiotics prophylactically and small amount of pain medicine for severe pain.  She was placed in a finger splint for immobilization.  Discussed splint care.  She will follow-up with orthopedics for reevaluation of her symptoms.  Discussed indications for return to the ED. Pt verbalized understanding of and agreement with plan and is safe for discharge home at this time. No complaints prior to discharge.   Final Clinical Impressions(s) / ED Diagnoses   Final diagnoses:  Closed displaced fracture of distal phalanx of right thumb, initial encounter    ED Discharge Orders        Ordered    cephALEXin (KEFLEX) 500 MG capsule  4 times daily     10/17/17 1031    traMADol (ULTRAM) 50 MG tablet  Every 12 hours PRN     10/17/17 1031       Renita Papa, PA-C 10/17/17 1049    Virgel Manifold, MD 10/20/17 [id number]

## 2017-10-17 NOTE — ED Triage Notes (Signed)
Pt slammed RT thumb in car door this morning. Mild bleeding noted beneath nail.

## 2017-10-22 ENCOUNTER — Telehealth: Payer: Self-pay | Admitting: Orthopedic Surgery

## 2017-10-22 NOTE — Telephone Encounter (Signed)
Tuesday or Thursday.

## 2017-10-22 NOTE — Telephone Encounter (Signed)
Latoya Decker called today stating she went to ER on 10/17/17 and has a  fx right thumb.  She is out of town at this time.  She wants to schedule an appointment.    Would you review and then advise as to whether to schedule her for Tuesday or Thursday of next week?  Thanks so much

## 2017-10-30 ENCOUNTER — Ambulatory Visit (INDEPENDENT_AMBULATORY_CARE_PROVIDER_SITE_OTHER): Payer: BLUE CROSS/BLUE SHIELD | Admitting: Orthopedic Surgery

## 2017-10-30 ENCOUNTER — Encounter: Payer: Self-pay | Admitting: Orthopedic Surgery

## 2017-10-30 VITALS — BP 130/79 | HR 95 | Ht 66.0 in | Wt 197.0 lb

## 2017-10-30 DIAGNOSIS — S62521A Displaced fracture of distal phalanx of right thumb, initial encounter for closed fracture: Secondary | ICD-10-CM | POA: Diagnosis not present

## 2017-10-30 NOTE — Progress Notes (Signed)
NEW PATIENT OFFICE VISIT   Chief Complaint  Patient presents with  . Hand Injury    right thumb vs car door     MEDICAL DECISION SECTION  xrays ordered? N  My independent reading of xrays: Independent personal interpretation of the x-ray taken at the hospital on April 5 transverse fracture distal phalanx right thumb nondisplaced   Encounter Diagnosis  Name Primary?  . Closed displaced fracture of distal phalanx of right thumb, initial encounter Yes     PLAN: Continue splinting 2 more weeks  X-ray and next visit  No orders of the defined types were placed in this encounter.  Injection? N MRI/CT/? N   Chief Complaint  Patient presents with  . Hand Injury    right thumb vs car door    57 year old female headed on vacation for anniversary closer right thumb in a car door sustained a transverse fracture sought medical attention 2 separate times after the initial x-ray and finally found a splint that would control her pain along with tramadol.  She complains of dull pain over the right thumb 2 weeks swelling noted slight loss of motion   Review of Systems  Skin: Negative.   Neurological: Negative.      Past Medical History:  Diagnosis Date  . Anxiety   . Depression   . Duodenal ulcer 01/05/2010   EGD Dr Dierdre Searles, h pylori gastritis, completed treatment  . Duodenitis   . Eosinophilic gastroenteritis JAN 2015   [place] 2014: NL CMP/GIARDIA Ag  . Family hx of colon cancer    Mother & sister  . Gastritis    hospitalized 6/23-6/25 2011   . Headache(784.0)    migraines  . Helicobacter pylori gastritis   . Hypertension   . PUD (peptic ulcer disease)    Dr Tamala Julian  . S/P colonoscopy 12/27/2003   Dr Voncille Lo  . S/P endoscopy 12/27/2003   Dr [facility] Service superficial ulcers GEJ, duodenitis,gastritis    Past Surgical History:  Procedure Laterality Date  . ABDOMINAL HYSTERECTOMY    . bilateral tubal ligation  1989  . BIOPSY  06/10/2011   SLF: mild gastritis, bx  benign, sb bx benign   . CHOLECYSTECTOMY  1997   aph-Smith  . COLONOSCOPY  05/2011   SLF: multiple hyperplastic sessile polyps, scattered diverticulosis. next TCS 05/2016 ([facility])  . COLONOSCOPY N/A 04/21/2017   Procedure: COLONOSCOPY;  Surgeon: Danie Binder, MD;  Location: [facility];  Service: Endoscopy;  Laterality: N/A;  1000  . ESOPHAGOGASTRODUODENOSCOPY N/A 6/62/9476   EOS INOPHILIC GASTROENTERITIS  . ESOPHAGOGASTRODUODENOSCOPY N/A 04/21/2017   Procedure: ESOPHAGOGASTRODUODENOSCOPY (EGD);  Surgeon: Danie Binder, MD;  Location: [facility];  Service: Endoscopy;  Laterality: N/A;  . PARTIAL HYSTERECTOMY  2007   1 ovary removed  . POLYPECTOMY  06/10/2011   Procedure: POLYPECTOMY;  Surgeon: Dorothyann Peng, MD;  Location: [facility] ORS;  Service: Endoscopy;;  Cecal polypectomy; Sigmoid colon polypectomies  . TUBAL LIGATION      Family History  Problem Relation Age of Onset  . Colon cancer Mother 32  . Diabetes Mother   . Hypertension Mother   . Stroke Mother   . Heart attack Father   . Diabetes Father   . Hypertension Father   . Colon cancer Sister 31  . Lupus Brother   . Parkinsonism Brother   . Anesthesia problems Neg Hx   . Hypotension Neg Hx   . Malignant hyperthermia Neg Hx   . Pseudochol deficiency Neg Hx  Social History   Tobacco Use  . Smoking status: Never Smoker  . Smokeless tobacco: Never Used  . Tobacco comment: Never smoker  Substance Use Topics  . Alcohol use: No  . Drug use: No    @ALL @  Current Meds  Medication Sig  . acetaminophen (TYLENOL) 325 MG tablet Take 2 tablets (650 mg total) by mouth every 6 (six) hours as needed.  . baclofen (LIORESAL) 10 MG tablet One tablet twice daily if needed for migraine headache. Maximum use is twice monthly (Patient taking differently: Take 10 mg by mouth 2 (two) times daily as needed (for migraine.). One tablet twice daily if needed for migraine headache. Maximum use is twice monthly)  . fluticasone  (FLONASE) 50 MCG/ACT nasal spray Place 1-2 sprays into both nostrils daily as needed for allergies.  Marland Kitchen lansoprazole (PREVACID) 30 MG capsule 1 PO 30 MINS BEFORE YOUR FIRST MEAL EVERY DAY  . topiramate (TOPAMAX) 25 MG tablet Take 100 mg by mouth at bedtime.  . traMADol (ULTRAM) 50 MG tablet Take 1 tablet (50 mg total) by mouth every 12 (twelve) hours as needed for severe pain.  . traZODone (DESYREL) 50 MG tablet TAKE 1 TABLET(50 MG) BY MOUTH AT BEDTIME AS NEEDED FOR SLEEP    BP 130/79   Pulse 95   Ht 5\' 6"  (1.676 m)   Wt 197 lb (89.4 kg)   BMI 31.80 kg/m   Physical Exam  Constitutional: She is oriented to person, place, and time. She appears well-developed and well-nourished.  Neurological: She is alert and oriented to person, place, and time.  Psychiatric: She has a normal mood and affect. Judgment normal.  Vitals reviewed.   Right Hand Exam   Tenderness  Right hand tenderness location: Distal phalanx right thumb.  Range of Motion  Hand  MP Thumb: normal  DIP Thumb: 40   Muscle Strength  Grip: 4/5   Tests  Phalen's Sign: negative Finkelstein's test: negative  Other  Erythema: absent Sensation: normal Pulse: present   Left Hand Exam  Left hand exam is normal.  Tenderness  The patient is experiencing no tenderness (Left thumb no tenderness).   Range of Motion  The patient has normal left wrist ROM.  Muscle Strength  The patient has normal left wrist strength.  Other  Erythema: absent Scars: absent Sensation: normal Pulse: present

## 2017-11-13 DIAGNOSIS — S62524D Nondisplaced fracture of distal phalanx of right thumb, subsequent encounter for fracture with routine healing: Secondary | ICD-10-CM

## 2017-11-13 HISTORY — DX: Nondisplaced fracture of distal phalanx of right thumb, subsequent encounter for fracture with routine healing: S62.524D

## 2017-11-14 ENCOUNTER — Ambulatory Visit (INDEPENDENT_AMBULATORY_CARE_PROVIDER_SITE_OTHER): Payer: Self-pay | Admitting: Orthopedic Surgery

## 2017-11-14 ENCOUNTER — Encounter: Payer: Self-pay | Admitting: Orthopedic Surgery

## 2017-11-14 ENCOUNTER — Ambulatory Visit (INDEPENDENT_AMBULATORY_CARE_PROVIDER_SITE_OTHER): Payer: BLUE CROSS/BLUE SHIELD

## 2017-11-14 VITALS — BP 163/107 | HR 77 | Ht 66.0 in | Wt 198.0 lb

## 2017-11-14 DIAGNOSIS — S62524D Nondisplaced fracture of distal phalanx of right thumb, subsequent encounter for fracture with routine healing: Secondary | ICD-10-CM

## 2017-11-14 MED ORDER — TRAMADOL HCL 50 MG PO TABS: 50.0000 mg | ORAL_TABLET | Freq: Two times a day (BID) | ORAL | 0 refills | Status: DC | PRN

## 2017-11-14 NOTE — Progress Notes (Signed)
Fracture care follow-up  Chief Complaint  Patient presents with  . Hand Injury    follow up thumb fracture right 10/17/17   4 weeks status post fracture distal phalanx right thumb  X-ray shows fracture healing without displacement  Clinical exam shows normal alignment with tenderness and swelling of the pulp space flexion extension normal repeat exam no x-ray 3 weeks  Plan repeat exam x-ray 3 weeks

## 2017-12-04 ENCOUNTER — Encounter: Payer: BLUE CROSS/BLUE SHIELD | Admitting: Family Medicine

## 2017-12-05 ENCOUNTER — Ambulatory Visit: Payer: BLUE CROSS/BLUE SHIELD | Admitting: Orthopedic Surgery

## 2017-12-09 ENCOUNTER — Encounter: Payer: Self-pay | Admitting: Orthopedic Surgery

## 2017-12-09 ENCOUNTER — Ambulatory Visit (INDEPENDENT_AMBULATORY_CARE_PROVIDER_SITE_OTHER): Payer: Self-pay | Admitting: Orthopedic Surgery

## 2017-12-09 VITALS — BP 131/85 | HR 79 | Ht 66.0 in | Wt 195.0 lb

## 2017-12-09 DIAGNOSIS — S62524D Nondisplaced fracture of distal phalanx of right thumb, subsequent encounter for fracture with routine healing: Secondary | ICD-10-CM

## 2017-12-09 NOTE — Patient Instructions (Signed)
Note for work today will be late

## 2017-12-09 NOTE — Progress Notes (Signed)
Fracture care follow-up   Previous note Chief Complaint  Patient presents with  . Hand Injury    follow up thumb fracture right 10/17/17   4 weeks status post fracture distal phalanx right thumb  X-ray shows fracture healing without displacement  Clinical exam shows normal alignment with tenderness and swelling of the pulp space flexion extension normal repeat exam no x-ray 3 weeks  Plan repeat exam x-ray 3 weeks  Today's visit  The patient is regained full range of motion of the IP joint of the right thumb with no pain or tenderness.  She does have some nail deformity which should partially correct over time  Patient released to follow-up as needed Encounter Diagnosis  Name Primary?  . Closed nondisplaced fracture of distal phalanx of right thumb with routine healing, subsequent encounter 10/17/17 Yes

## 2018-01-01 ENCOUNTER — Encounter: Payer: Self-pay | Admitting: Family Medicine

## 2018-01-01 ENCOUNTER — Other Ambulatory Visit: Payer: Self-pay

## 2018-01-01 ENCOUNTER — Ambulatory Visit (INDEPENDENT_AMBULATORY_CARE_PROVIDER_SITE_OTHER): Payer: BLUE CROSS/BLUE SHIELD | Admitting: Family Medicine

## 2018-01-01 ENCOUNTER — Other Ambulatory Visit (HOSPITAL_COMMUNITY)
Admission: RE | Admit: 2018-01-01 | Discharge: 2018-01-01 | Disposition: A | Discharge status: Home / Self Care | Payer: BLUE CROSS/BLUE SHIELD | Source: Ambulatory Visit | Attending: Family Medicine | Admitting: Family Medicine

## 2018-01-01 VITALS — BP 122/72 | HR 79 | Ht 67.0 in | Wt 198.0 lb

## 2018-01-01 DIAGNOSIS — E66811 Obesity, class 1: Secondary | ICD-10-CM

## 2018-01-01 DIAGNOSIS — E669 Obesity, unspecified: Secondary | ICD-10-CM

## 2018-01-01 DIAGNOSIS — D126 Benign neoplasm of colon, unspecified: Secondary | ICD-10-CM

## 2018-01-01 DIAGNOSIS — H547 Unspecified visual loss: Secondary | ICD-10-CM | POA: Diagnosis not present

## 2018-01-01 DIAGNOSIS — D229 Melanocytic nevi, unspecified: Secondary | ICD-10-CM | POA: Diagnosis not present

## 2018-01-01 DIAGNOSIS — Z0000 Encounter for general adult medical examination without abnormal findings: Secondary | ICD-10-CM | POA: Insufficient documentation

## 2018-01-01 NOTE — Patient Instructions (Signed)
Annual physical exam in 1 year, call if you need me before  Please get fasting lipid, chem 7, TSH, hBA1C, and Vitamin D, asap   It is important that you exercise regularly at least 30 minutes 5 times a week. If you develop chest pain, have severe difficulty breathing, or feel very tired, stop exercising immediately and seek medical attention     Please work on good  health habits so that your health will improve. 1. Commitment to daily physical activity for 30 to 60  minutes, if you are able to do this.  2. Commitment to wise food choices. Aim for half of your  food intake to be vegetable and fruit, one quarter starchy foods, and one quarter protein. Try to eat on a regular schedule  3 meals per day, snacking between meals should be limited to vegetables or fruits or small portions of nuts. 64 ounces of water per day is generally recommended, unless you have specific health conditions, like heart failure or kidney failure where you will need to limit fluid intake.  3. Commitment to sufficient and a  good quality of physical and mental rest daily, generally between 6 to 8 hours per day.  WITH PERSISTANCE AND PERSEVERANCE, THE IMPOSSIBLE , BECOMES THE NORM! Thank you  for choosing [facility]. We consider it a privelige to serve you.  Delivering excellent health care in a caring and  compassionate way is our goal.  Partnering with you,  so that together we can achieve this goal is our strategy.

## 2018-01-02 ENCOUNTER — Encounter: Payer: Self-pay | Admitting: Family Medicine

## 2018-01-02 DIAGNOSIS — D229 Melanocytic nevi, unspecified: Secondary | ICD-10-CM | POA: Insufficient documentation

## 2018-01-02 DIAGNOSIS — H547 Unspecified visual loss: Secondary | ICD-10-CM | POA: Insufficient documentation

## 2018-01-02 DIAGNOSIS — D126 Benign neoplasm of colon, unspecified: Secondary | ICD-10-CM | POA: Insufficient documentation

## 2018-01-02 NOTE — Assessment & Plan Note (Signed)
Enlarged mole on face above left eye, diameter approx 3 cm, pt requests removal, will refer to plastic surgery

## 2018-01-02 NOTE — Assessment & Plan Note (Signed)
Deteriorated. Patient re-educated about  the importance of commitment to a  minimum of 150 minutes of exercise per week.  The importance of healthy food choices with portion control discussed. Encouraged to start a food diary, count calories and to consider  joining a support group. Sample diet sheets offered. Goals set by the patient for the next several months.   Weight /BMI 01/01/2018 12/09/2017 11/14/2017  WEIGHT 198 lb 0.6 oz 195 lb 198 lb  HEIGHT 5\' 7"  5\' 6"  5\' 6"   BMI 31.02 kg/m2 31.47 kg/m2 31.96 kg/m2

## 2018-01-02 NOTE — Assessment & Plan Note (Signed)
Established cataracts, pt to contact her ophthalmologist regarding surgery

## 2018-01-02 NOTE — Assessment & Plan Note (Signed)

## 2018-01-02 NOTE — Progress Notes (Signed)
Latoya Decker     MRN: [id number]      DOB: 1961/01/19  HPI: Patient is in for annual physical exam. C/o moles on face above left eye enlarging in size which she wants removed C/o poor vision, was told in the past that she needed cataract surgery, ready to proceed with this as she realizes that her vision is getting significantly worse, states she will call her ophthalmologist and arrange this Recent labs, if available are reviewed. Immunization is reviewed , and  Is up to date Has been exercising regularly Workings on better food choice, and reports good sleep , and denies depression and anxiety, feels well. Headaches are controled   PE: BP 122/72 (BP Location: Left Arm, Patient Position: Sitting, Cuff Size: Large)   Pulse 79   Ht 5\' 7"  (1.702 m)   Wt 198 lb 0.6 oz (89.8 kg)   SpO2 99%   BMI 31.02 kg/m   Pleasant  female, alert and oriented x 3, in no cardio-pulmonary distress. Afebrile. HEENT No facial trauma or asymetry. Sinuses non tender.  Extra occullar muscles intact,  External ears normal, tympanic membranes clear. Oropharynx moist, no exudate. Neck: supple, no adenopathy,JVD or thyromegaly.No bruits.  Chest: Clear to ascultation bilaterally.No crackles or wheezes. Non tender to palpation  Breast: No asymetry,no masses or lumps. No tenderness. No nipple discharge or inversion. No axillary or supraclavicular adenopathy  Cardiovascular system; Heart sounds normal,  S1 and  S2 ,no S3.  No murmur, or thrill. Apical beat not displaced Peripheral pulses normal.  Abdomen: Soft, non tender, no organomegaly or masses. No bruits. Bowel sounds normal. No guarding, tenderness or rebound.  Rectal:  Not indicated. Colonoscopy most recently done was in 04/2017 and she has polyps  GU: External genitalia normal female genitalia , normal female distribution of hair. No lesions. Urethral meatus normal in size, no  Prolapse, no lesions visibly  Present. Bladder non  tender. Vagina pink and moist , with no visible lesions , discharge present . Adequate pelvic support no  cystocele or rectocele noted  Uterus absent, no adnexal masses, no adnexal tenderness.   Musculoskeletal exam: Full ROM of spine, hips , shoulders and knees. No deformity ,swelling or crepitus noted. No muscle wasting or atrophy.   Neurologic: Cranial nerves 2 to 12 intact. Power, tone ,sensation and reflexes normal throughout. No disturbance in gait. No tremor.  Skin: Intact, no ulceration, erythema , scaling or rash noted. Pigmentation normal throughout Hyperpigmented flat moles on lateral aspect of supraorbital area of left eye, maximal diameter 3 cm  Psych; Normal mood and affect. Judgement and concentration normal   Assessment & Plan:  Annual physical exam Annual exam as documented. Counseling done  re healthy lifestyle involving commitment to 150 minutes exercise per week, heart healthy diet, and attaining healthy weight.The importance of adequate sleep also discussed. Regular seat belt use and home safety, is also discussed. Changes in health habits are decided on by the patient with goals and time frames  set for achieving them. Immunization and cancer screening needs are specifically addressed at this visit.   Enlarged skin mole Enlarged mole on face above left eye, diameter approx 3 cm, pt requests removal, will refer to plastic surgery  Decreased vision Established cataracts, pt to contact her ophthalmologist regarding surgery  Obesity (BMI 30.0-34.9) Deteriorated. Patient re-educated about  the importance of commitment to a  minimum of 150 minutes of exercise per week.  The importance of healthy food choices with portion  control discussed. Encouraged to start a food diary, count calories and to consider  joining a support group. Sample diet sheets offered. Goals set by the patient for the next several months.   Weight /BMI 01/01/2018 12/09/2017 11/14/2017    WEIGHT 198 lb 0.6 oz 195 lb 198 lb  HEIGHT 5\' 7"  5\' 6"  5\' 6"   BMI 31.02 kg/m2 31.47 kg/m2 31.96 kg/m2

## 2018-01-06 LAB — CYTOLOGY - PAP
ADEQUACY: ABSENT
CHLAMYDIA, DNA PROBE: NEGATIVE
Candida vaginitis: NEGATIVE
DIAGNOSIS: NEGATIVE
HPV: NOT DETECTED
NEISSERIA GONORRHEA: NEGATIVE
Trichomonas: NEGATIVE

## 2018-02-06 ENCOUNTER — Other Ambulatory Visit: Payer: Self-pay

## 2018-04-14 ENCOUNTER — Telehealth: Payer: Self-pay

## 2018-04-14 ENCOUNTER — Telehealth: Payer: Self-pay | Admitting: Family Medicine

## 2018-04-14 DIAGNOSIS — R1030 Lower abdominal pain, unspecified: Secondary | ICD-10-CM

## 2018-04-14 NOTE — Telephone Encounter (Signed)
Spoke with pt and referral entered for dr fields

## 2018-04-14 NOTE — Telephone Encounter (Signed)
pls call pt see note written b by me but  Not routed successfully

## 2018-04-14 NOTE — Telephone Encounter (Signed)
Patient called in this morning complaining of stomach pain that has been consistent since Saturday. She says the pain is in the lower part of her stomach, beneath her belly button.  Cb#: [id number]/ [phone]

## 2018-04-14 NOTE — Telephone Encounter (Signed)
Needs to see gI dr fields for abdominal pain, ensure no urenary symptoms and refer for asap appt please, If vomit, diarrheah, symptoms of dehydration, needs urgent care visit also

## 2018-04-14 NOTE — Telephone Encounter (Signed)
States you told her if this pain come back to call back to see what you advised

## 2018-04-15 ENCOUNTER — Other Ambulatory Visit: Payer: Self-pay | Admitting: Family Medicine

## 2018-04-15 ENCOUNTER — Telehealth: Payer: Self-pay | Admitting: Family Medicine

## 2018-04-15 ENCOUNTER — Encounter: Payer: Self-pay | Admitting: Gastroenterology

## 2018-04-15 DIAGNOSIS — R1030 Lower abdominal pain, unspecified: Secondary | ICD-10-CM

## 2018-04-15 DIAGNOSIS — K5790 Diverticulosis of intestine, part unspecified, without perforation or abscess without bleeding: Secondary | ICD-10-CM

## 2018-04-15 NOTE — Telephone Encounter (Signed)
I spoke with the pt having lower abdominal cramping pain, since Saturday, no urinary symptoms,not eating, needs GI eval, ps call and see if she can be seen this week

## 2018-04-15 NOTE — Telephone Encounter (Signed)
Called pt to advise that I scheduled her an appt with Marylee Floras  On 04-16-18 @ 10:15

## 2018-04-15 NOTE — Telephone Encounter (Signed)
Noted  

## 2018-04-15 NOTE — Telephone Encounter (Signed)
Pt scheduled for tomorrow

## 2018-04-15 NOTE — Progress Notes (Signed)
amb gastro  

## 2018-04-16 ENCOUNTER — Ambulatory Visit (HOSPITAL_COMMUNITY)
Admission: RE | Admit: 2018-04-16 | Discharge: 2018-04-16 | Disposition: A | Discharge status: Home / Self Care | Payer: BLUE CROSS/BLUE SHIELD | Source: Ambulatory Visit | Attending: Gastroenterology | Admitting: Gastroenterology

## 2018-04-16 ENCOUNTER — Encounter: Payer: Self-pay | Admitting: Gastroenterology

## 2018-04-16 ENCOUNTER — Other Ambulatory Visit (HOSPITAL_COMMUNITY)
Admission: RE | Admit: 2018-04-16 | Discharge: 2018-04-16 | Disposition: A | Discharge status: Home / Self Care | Payer: BLUE CROSS/BLUE SHIELD | Source: Ambulatory Visit | Attending: Gastroenterology | Admitting: Gastroenterology

## 2018-04-16 ENCOUNTER — Ambulatory Visit (INDEPENDENT_AMBULATORY_CARE_PROVIDER_SITE_OTHER): Payer: BLUE CROSS/BLUE SHIELD | Admitting: Gastroenterology

## 2018-04-16 VITALS — BP 153/83 | HR 101 | Temp 97.4°F | Ht 66.0 in | Wt 201.6 lb

## 2018-04-16 DIAGNOSIS — R1031 Right lower quadrant pain: Secondary | ICD-10-CM

## 2018-04-16 DIAGNOSIS — N200 Calculus of kidney: Secondary | ICD-10-CM | POA: Diagnosis not present

## 2018-04-16 DIAGNOSIS — R748 Abnormal levels of other serum enzymes: Secondary | ICD-10-CM

## 2018-04-16 DIAGNOSIS — B9681 Helicobacter pylori [H. pylori] as the cause of diseases classified elsewhere: Secondary | ICD-10-CM

## 2018-04-16 DIAGNOSIS — K429 Umbilical hernia without obstruction or gangrene: Secondary | ICD-10-CM | POA: Insufficient documentation

## 2018-04-16 DIAGNOSIS — K2970 Gastritis, unspecified, without bleeding: Secondary | ICD-10-CM

## 2018-04-16 DIAGNOSIS — K760 Fatty (change of) liver, not elsewhere classified: Secondary | ICD-10-CM | POA: Insufficient documentation

## 2018-04-16 LAB — CBC WITH DIFFERENTIAL/PLATELET
Basophils Absolute: 0 10*3/uL (ref 0.0–0.1)
Basophils Relative: 0 %
EOS PCT: 2 %
Eosinophils Absolute: 0.1 10*3/uL (ref 0.0–0.7)
HCT: 43.7 % (ref 36.0–46.0)
Hemoglobin: 14.4 g/dL (ref 12.0–15.0)
LYMPHS PCT: 38 %
Lymphs Abs: 2.8 10*3/uL (ref 0.7–4.0)
MCH: 29.9 pg (ref 26.0–34.0)
MCHC: 33 g/dL (ref 30.0–36.0)
MCV: 90.9 fL (ref 78.0–100.0)
MONO ABS: 0.5 10*3/uL (ref 0.1–1.0)
Monocytes Relative: 7 %
Neutro Abs: 3.9 10*3/uL (ref 1.7–7.7)
Neutrophils Relative %: 53 %
PLATELETS: 249 10*3/uL (ref 150–400)
RBC: 4.81 MIL/uL (ref 3.87–5.11)
RDW: 12.1 % (ref 11.5–15.5)
WBC: 7.4 10*3/uL (ref 4.0–10.5)

## 2018-04-16 LAB — COMPREHENSIVE METABOLIC PANEL
ALT: 18 U/L (ref 0–44)
AST: 18 U/L (ref 15–41)
Albumin: 4 g/dL (ref 3.5–5.0)
Alkaline Phosphatase: 159 U/L — ABNORMAL HIGH (ref 38–126)
Anion gap: 10 (ref 5–15)
BUN: 11 mg/dL (ref 6–20)
CHLORIDE: 108 mmol/L (ref 98–111)
CO2: 23 mmol/L (ref 22–32)
CREATININE: 0.91 mg/dL (ref 0.44–1.00)
Calcium: 9.4 mg/dL (ref 8.9–10.3)
GFR calc Af Amer: >60 mL/min (ref >60)
GFR calc non Af Amer: >60 mL/min (ref >60)
Glucose, Bld: 121 mg/dL — ABNORMAL HIGH (ref 70–99)
Potassium: 3.2 mmol/L — ABNORMAL LOW (ref 3.5–5.1)
Sodium: 141 mmol/L (ref 135–145)
Total Bilirubin: 0.5 mg/dL (ref 0.3–1.2)
Total Protein: 7.3 g/dL (ref 6.5–8.1)

## 2018-04-16 LAB — GAMMA GT: GGT: 32 U/L (ref 7–50)

## 2018-04-16 MED ORDER — IOPAMIDOL (ISOVUE-300) INJECTION 61%
100.0000 mL | Freq: Once | INTRAVENOUS | Status: AC | PRN
  Administered 2018-04-16: 100 mL via INTRAVENOUS

## 2018-04-16 MED ORDER — CIPROFLOXACIN HCL 500 MG PO TABS: 500.0000 mg | ORAL_TABLET | Freq: Two times a day (BID) | ORAL | 0 refills | Status: AC

## 2018-04-16 MED ORDER — METRONIDAZOLE 500 MG PO TABS: 500.0000 mg | ORAL_TABLET | Freq: Three times a day (TID) | ORAL | 0 refills | Status: AC

## 2018-04-16 NOTE — Assessment & Plan Note (Signed)
Initially positive in 2011, with amoxicillin and clarithromycin at the time.  She had a H. pylori stool antigen which was negative in November 2012.  Gastric biopsies negative for H. pylori in 2012 and 2015.  Now positive on recent EGD. Will plan to treat with Pylera and PPI BID once acute illness worked up and resolved.

## 2018-04-16 NOTE — Progress Notes (Signed)
Called pt, she is still at the hospital waiting to have the CT. She will pick up the potassium this afternoon. She is aware we are adding another lab. I called [place] service and spoke to [place]. She told me to call back Fri am, it is not showing in her system  Yet.

## 2018-04-16 NOTE — Assessment & Plan Note (Signed)
Chronically elevated work-up.  Limited evaluation at this point recheck AMA and GGT.

## 2018-04-16 NOTE — Patient Instructions (Signed)
Called [employer], no PA needed for stat CT abd/pelvis. Ref# [id number].

## 2018-04-16 NOTE — Progress Notes (Signed)
Primary Care Physician: Fayrene Helper, MD  Primary Gastroenterologist:  Barney Drain, MD   Chief Complaint  Patient presents with  . Abdominal Pain    lower abd all across, comes/goes, sharp/stabbing pain. Started on saturday. Abd feels "hot"  . Nausea    no vomiting  . Constipation    "very little"    HPI: Latoya Decker is a 57 y.o. female here for next day appointment for abdominal pain.  She was last seen back in August 2018 when she presented with dysphasia, odynophagia, need for updated colonoscopy for family history of colon cancer.  EGD and colonoscopy in October 2018, biopsies from the esophagus were normal, she had mild gastritis, H. pylori positive on biopsy.  She had couple of polyps removed from the colon, some were tubular adenomas, diverticulosis and hemorrhoids as well.  Next colonoscopy in 5 years.  In 2011 she had gastric biopsy with H. pylori.  She had a negative H. pylori stool antigen November 2012.  Gastric biopsy in 05/2011 was negative for H. Pylori.  Gastric biopsy in 2015 negative for H. Pylori.  She has not been retreated for H.pylori since her positive biopsy in 04/2017. Patient aware we will address this once over acute illness.   Patient developed lower abdominal pain on Saturday while eating out.  She had a little nausea.  No vomiting.  Symptoms associated with several loose stools.  Pain predominantly in the right lower quadrant with some radiation to the left side.  No blood per rectum.  No melena.  Stomach felt hot to touch.  Denies urinary symptoms.  Her stools are starting to firm back up.  She has not been able to eat much, having to stick to a very bland diet.  Felt somewhat better today.  No documented fever.    . COLONOSCOPY N/A 04/21/2017   Dr. Oneida Alar: two 2-42mm colon polyps removed, diverticulosis, hemorrhoids. next TCS in 5 years.   . ESOPHAGOGASTRODUODENOSCOPY N/A 04/21/2017   Dr. Oneida Alar: mild gastritis with H.pylori present,  esophageal biopsies negative.      Current Outpatient Medications  Medication Sig Dispense Refill  . acetaminophen (TYLENOL) 325 MG tablet Take 2 tablets (650 mg total) by mouth every 6 (six) hours as needed. 30 tablet 0  . baclofen (LIORESAL) 10 MG tablet One tablet twice daily if needed for migraine headache. Maximum use is twice monthly (Patient taking differently: Take 10 mg by mouth 2 (two) times daily as needed (for migraine.). One tablet twice daily if needed for migraine headache. Maximum use is twice monthly) 30 each 1  . fluticasone (FLONASE) 50 MCG/ACT nasal spray Place 1-2 sprays into both nostrils daily as needed for allergies.    Marland Kitchen lansoprazole (PREVACID) 30 MG capsule 1 PO 30 MINS BEFORE YOUR FIRST MEAL EVERY DAY 30 capsule 11  . topiramate (TOPAMAX) 25 MG tablet Take 100 mg by mouth at bedtime.    . traMADol (ULTRAM) 50 MG tablet Take 1 tablet (50 mg total) by mouth every 12 (twelve) hours as needed for severe pain. 10 tablet 0  . traZODone (DESYREL) 50 MG tablet TAKE 1 TABLET(50 MG) BY MOUTH AT BEDTIME AS NEEDED FOR SLEEP 30 tablet 0   No current facility-administered medications for this visit.     Allergies as of 04/16/2018 - Review Complete 04/16/2018  Allergen Reaction Noted  . Codeine Nausea And Vomiting 06/30/2008  . Hydrocodone Nausea And Vomiting 01/12/2010  . Lidocaine viscous hcl  08/25/2013  ROS:  General: Negative for fever.  Some malaise.  Loss of appetite. ENT: Negative for hoarseness, difficulty swallowing , nasal congestion. CV: Negative for chest pain, angina, palpitations, dyspnea on exertion, peripheral edema.  Respiratory: Negative for dyspnea at rest, dyspnea on exertion, cough, sputum, wheezing.  GI: See history of present illness. GU:  Negative for dysuria, hematuria, urinary incontinence, urinary frequency, nocturnal urination.  Endo: Negative for unusual weight change.    Physical Examination:   BP (!) 153/83   Pulse (!) 101   Temp  (!) 97.4 F (36.3 C) (Oral)   Ht 5\' 6"  (1.676 m)   Wt 201 lb 9.6 oz (91.4 kg)   BMI 32.54 kg/m   General: Well-nourished, well-developed in no acute distress.  Eyes: No icterus. Mouth: Oropharyngeal mucosa moist and pink , no lesions erythema or exudate. Lungs: Clear to auscultation bilaterally.  Heart: Regular rate and rhythm, no murmurs rubs or gallops.  Abdomen: Bowel sounds are normal, moderate right lower quadrant tenderness, nondistended, no hepatosplenomegaly or masses, no abdominal bruits or hernia , no rebound or guarding.   Extremities: No lower extremity edema. No clubbing or deformities. Neuro: Alert and oriented x 4   Skin: Warm and dry, no jaundice.   Psych: Alert and cooperative, normal mood and affect.  Labs:  Lab Results  Component Value Date   CREATININE 1.12 (H) 08/16/2017   BUN 17 08/16/2017   NA 142 08/16/2017   K 3.6 08/16/2017   CL 111 08/16/2017   CO2 20 (L) 08/16/2017   Lab Results  Component Value Date   ALT 17 08/16/2017   AST 17 08/16/2017   ALKPHOS 174 (H) 08/16/2017   BILITOT 0.5 08/16/2017   Lab Results  Component Value Date   WBC 5.9 08/16/2017   HGB 13.2 08/16/2017   HCT 41.2 08/16/2017   MCV 90.4 08/16/2017   PLT 233 08/16/2017    Imaging Studies: No results found.

## 2018-04-16 NOTE — Assessment & Plan Note (Signed)
Acute onset right lower quadrant pain which began 5 days ago, initially with diarrhea.  Associated with nausea but no vomiting.  Has a history of pancolonic diverticula we may be dealing with diverticulitis however given the right lower quadrant pain we need to consider appendicitis. Labs ordered. Obtain CT A/P with contrast today. If negative for appendicitis, would consider 10 days course of cipro/flagyl for diverticulitis.

## 2018-04-16 NOTE — Patient Instructions (Addendum)
1. Please go for CT scan and labs at [facility]. Have your labs drawn as soon as possible.  2. I have sent in RX for Cipro and Flagyl, hold until after CT results.

## 2018-04-17 ENCOUNTER — Other Ambulatory Visit: Payer: Self-pay

## 2018-04-17 ENCOUNTER — Other Ambulatory Visit (HOSPITAL_COMMUNITY)
Admission: RE | Admit: 2018-04-17 | Discharge: 2018-04-17 | Disposition: A | Discharge status: Home / Self Care | Payer: BLUE CROSS/BLUE SHIELD | Source: Ambulatory Visit | Attending: Gastroenterology | Admitting: Gastroenterology

## 2018-04-17 DIAGNOSIS — R748 Abnormal levels of other serum enzymes: Secondary | ICD-10-CM

## 2018-04-17 NOTE — Progress Notes (Signed)
cc'ed to pcp °

## 2018-04-17 NOTE — Progress Notes (Signed)
Noted  

## 2018-04-17 NOTE — Progress Notes (Signed)
I called [facility], Maudie Mercury said it must have been done In house since they are not showing it. I called [facility] lab and spoke to [place], she said she has another tube and it can be added. She asked me to put in [facility] lab and fax to her.  I have just faxed it.

## 2018-04-18 ENCOUNTER — Encounter (HOSPITAL_COMMUNITY): Payer: Self-pay | Admitting: Emergency Medicine

## 2018-04-18 ENCOUNTER — Emergency Department (HOSPITAL_COMMUNITY)
Admission: EM | Admit: 2018-04-18 | Discharge: 2018-04-18 | Disposition: A | Discharge status: Home / Self Care | Payer: BLUE CROSS/BLUE SHIELD | Attending: Emergency Medicine | Admitting: Emergency Medicine

## 2018-04-18 ENCOUNTER — Other Ambulatory Visit: Payer: Self-pay

## 2018-04-18 DIAGNOSIS — I10 Essential (primary) hypertension: Secondary | ICD-10-CM | POA: Insufficient documentation

## 2018-04-18 DIAGNOSIS — Z79899 Other long term (current) drug therapy: Secondary | ICD-10-CM | POA: Diagnosis not present

## 2018-04-18 DIAGNOSIS — M455 Ankylosing spondylitis of thoracolumbar region: Secondary | ICD-10-CM | POA: Insufficient documentation

## 2018-04-18 DIAGNOSIS — R1032 Left lower quadrant pain: Secondary | ICD-10-CM | POA: Diagnosis not present

## 2018-04-18 DIAGNOSIS — R109 Unspecified abdominal pain: Secondary | ICD-10-CM

## 2018-04-18 DIAGNOSIS — R110 Nausea: Secondary | ICD-10-CM | POA: Diagnosis not present

## 2018-04-18 DIAGNOSIS — R1031 Right lower quadrant pain: Secondary | ICD-10-CM | POA: Diagnosis present

## 2018-04-18 HISTORY — DX: Diverticulosis of intestine, part unspecified, without perforation or abscess without bleeding: K57.90

## 2018-04-18 LAB — URINALYSIS, ROUTINE W REFLEX MICROSCOPIC
Bilirubin Urine: NEGATIVE
GLUCOSE, UA: NEGATIVE mg/dL
HGB URINE DIPSTICK: NEGATIVE
Ketones, ur: 5 mg/dL — AB
NITRITE: NEGATIVE
Protein, ur: NEGATIVE mg/dL
SPECIFIC GRAVITY, URINE: 1.024 (ref 1.005–1.030)
pH: 5 (ref 5.0–8.0)

## 2018-04-18 LAB — COMPREHENSIVE METABOLIC PANEL
ALBUMIN: 4.4 g/dL (ref 3.5–5.0)
ALK PHOS: 157 U/L — AB (ref 38–126)
ALT: 18 U/L (ref 0–44)
ANION GAP: 10 (ref 5–15)
AST: 20 U/L (ref 15–41)
BUN: 13 mg/dL (ref 6–20)
CALCIUM: 9.2 mg/dL (ref 8.9–10.3)
CO2: 23 mmol/L (ref 22–32)
CREATININE: 1.06 mg/dL — AB (ref 0.44–1.00)
Chloride: 108 mmol/L (ref 98–111)
GFR calc Af Amer: >60 mL/min (ref >60)
GFR calc non Af Amer: 58 mL/min — ABNORMAL LOW (ref >60)
GLUCOSE: 96 mg/dL (ref 70–99)
Potassium: 4 mmol/L (ref 3.5–5.1)
Sodium: 141 mmol/L (ref 135–145)
Total Bilirubin: 0.7 mg/dL (ref 0.3–1.2)
Total Protein: 7.7 g/dL (ref 6.5–8.1)

## 2018-04-18 LAB — CBC WITH DIFFERENTIAL/PLATELET
BASOS PCT: 0 %
Basophils Absolute: 0 10*3/uL (ref 0.0–0.1)
EOS ABS: 0.1 10*3/uL (ref 0.0–0.7)
EOS PCT: 1 %
HCT: 43 % (ref 36.0–46.0)
Hemoglobin: 14.2 g/dL (ref 12.0–15.0)
Lymphocytes Relative: 30 %
Lymphs Abs: 2.8 10*3/uL (ref 0.7–4.0)
MCH: 30.1 pg (ref 26.0–34.0)
MCHC: 33 g/dL (ref 30.0–36.0)
MCV: 91.1 fL (ref 78.0–100.0)
MONO ABS: 0.7 10*3/uL (ref 0.1–1.0)
MONOS PCT: 7 %
Neutro Abs: 5.5 10*3/uL (ref 1.7–7.7)
Neutrophils Relative %: 62 %
Platelets: 250 10*3/uL (ref 150–400)
RBC: 4.72 MIL/uL (ref 3.87–5.11)
RDW: 12.1 % (ref 11.5–15.5)
WBC: 9.1 10*3/uL (ref 4.0–10.5)

## 2018-04-18 LAB — MITOCHONDRIAL ANTIBODIES: Mitochondrial M2 Ab, IgG: <20 Units (ref 0.0–20.0)

## 2018-04-18 LAB — LIPASE, BLOOD: Lipase: 24 U/L (ref 11–51)

## 2018-04-18 MED ORDER — KETOROLAC TROMETHAMINE 30 MG/ML IJ SOLN
15.0000 mg | Freq: Once | INTRAMUSCULAR | Status: AC
  Administered 2018-04-18: 15 mg via INTRAVENOUS
  Filled 2018-04-18: qty 1

## 2018-04-18 MED ORDER — SODIUM CHLORIDE 0.9 % IV BOLUS
1000.0000 mL | Freq: Once | INTRAVENOUS | Status: AC
  Administered 2018-04-18: 1000 mL via INTRAVENOUS

## 2018-04-18 MED ORDER — ONDANSETRON 4 MG PO TBDP: 4.0000 mg | ORAL_TABLET | Freq: Three times a day (TID) | ORAL | 0 refills | Status: DC | PRN

## 2018-04-18 MED ORDER — NAPROXEN 375 MG PO TABS: 375.0000 mg | ORAL_TABLET | Freq: Two times a day (BID) | ORAL | 0 refills | Status: DC

## 2018-04-18 MED ORDER — ONDANSETRON HCL 4 MG/2ML IJ SOLN
4.0000 mg | Freq: Once | INTRAMUSCULAR | Status: AC
  Administered 2018-04-18: 4 mg via INTRAVENOUS
  Filled 2018-04-18: qty 2

## 2018-04-18 MED ORDER — ACETAMINOPHEN 325 MG PO TABS
650.0000 mg | ORAL_TABLET | Freq: Once | ORAL | Status: AC
  Administered 2018-04-18: 650 mg via ORAL
  Filled 2018-04-18: qty 2

## 2018-04-18 NOTE — ED Triage Notes (Signed)
Patient c/o bilateral flank pain. Patient states worse on left side. Patient states had pain in lower abd last weekend and was seen here for possible appendicitis which was negative. Patient given diagnosed with diverticulosis in which she has a hx. Patient given cipro and metronidazole and told to come back if pain gets worse. Patient states pain is now in back and flank and doesn't feel like her"normal diverticulosis pain." Per patient nausea. Denies any diarrhea, vomiting, or fever. Patient does report urinating frequently.

## 2018-04-18 NOTE — Discharge Instructions (Signed)
Your workup today does not show an obvious cause for your symptoms. If you develop continued or worsening pain, vomiting, fever, urinary tract infection symptoms (burning when you urinate, frequency, blood in the urine) or any other new/concerning symptoms then return to the ER for evaluation. Otherwise, follow up with your doctor in 2 days.   You are being prescribed an anti-inflammatory medicine today.  Do not take other nonsteroidal anti-inflammatory such as ibuprofen, Motrin, Aleve, Advil, naproxen or Naprosyn.

## 2018-04-18 NOTE — ED Provider Notes (Signed)
[facility] EMERGENCY DEPARTMENT Provider Note   CSN: [id number] Arrival date & time: 04/18/18  1705     History   Chief Complaint Chief Complaint  Patient presents with  . Flank Pain    HPI Latoya Decker is a 57 y.o. female.  HPI  57 year old female presents with abdominal and back pain.  She is been having abdominal pain since 1 week ago.  Started in the right lower quadrant and then has been going across her entire lower abdomen.  Saw her gastroenterologist 2 days ago and had a CT scan done that did not show any appendicitis.  Had some labs drawn and her potassium was little low.  She was treated presumptively for diverticulitis without the CT results with Cipro and Flagyl.  Today she started developing some nausea and started having back pain, especially in her left flank.  She is been having some urinary frequency today but no dysuria or hematuria.  Pain is currently severe.  She has not taken anything for the pain today.  Past Medical History:  Diagnosis Date  . Anxiety   . Depression   . Diverticulosis   . Duodenal ulcer 01/05/2010   EGD Dr Dierdre Searles, h pylori gastritis, completed treatment  . Duodenitis   . Eosinophilic gastroenteritis JAN 2015   [place] 2014: NL CMP/GIARDIA Ag  . Family hx of colon cancer    Mother & sister  . Gastritis    hospitalized 6/23-6/25 2011   . Headache(784.0)    migraines  . Helicobacter pylori gastritis   . Hypertension   . PUD (peptic ulcer disease)    Dr Tamala Julian  . S/P colonoscopy 12/27/2003   Dr Voncille Lo  . S/P endoscopy 12/27/2003   Dr [place] [facility] superficial ulcers GEJ, duodenitis,gastritis    Patient Active Problem List   Diagnosis Date Noted  . Right lower quadrant abdominal pain 04/16/2018  . Helicobacter pylori gastritis 04/16/2018  . Tubular adenoma of colon 01/02/2018  . Enlarged skin mole 01/02/2018  . Decreased vision 01/02/2018  . Nondisplaced fracture of distal phalanx of right thumb with routine healing 10/17/17  11/13/2017  . GERD (gastroesophageal reflux disease) 03/14/2017  . Elevated alkaline phosphatase level 10/16/2016  . Annual physical exam 02/27/2015  . Prediabetes 05/01/2014  . Allergic rhinitis 11/24/2013  . Insomnia 07/01/2013  . Migraine variant without intractability 08/31/2012  . Family history of colon cancer 05/14/2011  . Duodenal ulcer without hemorrhage or perforation and without obstruction 01/12/2010  . Obesity (BMI 30.0-34.9) 03/30/2008    Past Surgical History:  Procedure Laterality Date  . ABDOMINAL HYSTERECTOMY    . bilateral tubal ligation  1989  . BIOPSY  06/10/2011   SLF: mild gastritis, bx benign, sb bx benign   . CHOLECYSTECTOMY  1997   aph-Smith  . COLONOSCOPY  05/2011   SLF: multiple hyperplastic sessile polyps, scattered diverticulosis. next TCS 05/2016 ([facility])  . COLONOSCOPY N/A 04/21/2017   Dr. Oneida Alar: two 2-71mm colon polyps removed, diverticulosis, hemorrhoids. next TCS in 5 years.   . ESOPHAGOGASTRODUODENOSCOPY N/A 8/78/6767   EOS INOPHILIC GASTROENTERITIS  . ESOPHAGOGASTRODUODENOSCOPY N/A 04/21/2017   Dr. Oneida Alar: mild gastritis with H.pylori present, esophageal biopsies negative.   Marland Kitchen PARTIAL HYSTERECTOMY  2007   1 ovary removed  . POLYPECTOMY  06/10/2011   Procedure: POLYPECTOMY;  Surgeon: Dorothyann Peng, MD;  Location: [facility];  Service: Endoscopy;;  Cecal polypectomy; Sigmoid colon polypectomies  . TUBAL LIGATION       OB History    [place]  3   Para      Term      Preterm      AB      Living  3     SAB      TAB      Ectopic      Multiple      Live Births               Home Medications    Prior to Admission medications   Medication Sig Start Date End Date Taking? Authorizing Provider  acetaminophen (TYLENOL) 325 MG tablet Take 2 tablets (650 mg total) by mouth every 6 (six) hours as needed. 08/16/17  Yes Couture, Cortni S, PA-C  baclofen (LIORESAL) 10 MG tablet One tablet twice daily if needed for migraine  headache. Maximum use is twice monthly Patient taking differently: Take 10 mg by mouth 2 (two) times daily as needed (for migraine.). One tablet twice daily if needed for migraine headache. Maximum use is twice monthly 10/15/16  Yes Fayrene Helper, MD  ciprofloxacin (CIPRO) 500 MG tablet Take 1 tablet (500 mg total) by mouth 2 (two) times daily for 10 days. 04/16/18 04/26/18 Yes Mahala Menghini, PA-C  fluticasone (FLONASE) 50 MCG/ACT nasal spray Place 1-2 sprays into both nostrils daily as needed for allergies.   Yes [provider]  lansoprazole (PREVACID) 30 MG capsule 1 PO 30 MINS BEFORE YOUR FIRST MEAL EVERY DAY 04/21/17  Yes Fields, Marga Melnick, MD  metroNIDAZOLE (FLAGYL) 500 MG tablet Take 1 tablet (500 mg total) by mouth 3 (three) times daily for 10 days. 04/16/18 04/26/18 Yes Mahala Menghini, PA-C  Potassium Chloride ER 20 MEQ TBCR Take 1 tablet by mouth 2 (two) times daily. Starting 04/16/2018 x 3 days. 04/16/18  Yes [provider]  topiramate (TOPAMAX) 25 MG tablet Take 100 mg by mouth at bedtime.   Yes [provider]  traMADol (ULTRAM) 50 MG tablet Take 1 tablet (50 mg total) by mouth every 12 (twelve) hours as needed for severe pain. 11/14/17  Yes Carole Civil, MD  traZODone (DESYREL) 50 MG tablet TAKE 1 TABLET(50 MG) BY MOUTH AT BEDTIME AS NEEDED FOR SLEEP 09/23/17  Yes Fayrene Helper, MD  naproxen (NAPROSYN) 375 MG tablet Take 1 tablet (375 mg total) by mouth 2 (two) times daily with a meal. 04/18/18   Sherwood Gambler, MD  ondansetron (ZOFRAN ODT) 4 MG disintegrating tablet Take 1 tablet (4 mg total) by mouth every 8 (eight) hours as needed for nausea or vomiting. 04/18/18   Sherwood Gambler, MD    Family History Family History  Problem Relation Age of Onset  . Colon cancer Mother 74  . Diabetes Mother   . Hypertension Mother   . Stroke Mother   . Heart attack Father   . Diabetes Father   . Hypertension Father   . Colon cancer Sister 106  . Lupus  Brother   . Parkinsonism Brother   . Anesthesia problems Neg Hx   . Hypotension Neg Hx   . Malignant hyperthermia Neg Hx   . Pseudochol deficiency Neg Hx     Social History Social History   Tobacco Use  . Smoking status: Never Smoker  . Smokeless tobacco: Never Used  . Tobacco comment: Never smoker  Substance Use Topics  . Alcohol use: No  . Drug use: No     Allergies   Codeine; Hydrocodone; and Lidocaine viscous hcl   Review of Systems Review of Systems  Constitutional: Negative for fever.  Respiratory: Negative for shortness of breath.   Gastrointestinal: Positive for abdominal pain and nausea. Negative for blood in stool, diarrhea and vomiting.  Genitourinary: Positive for flank pain and frequency. Negative for dysuria and hematuria.  Musculoskeletal: Positive for back pain.  All other systems reviewed and are negative.    Physical Exam Updated Vital Signs BP 129/74   Pulse 69   Temp 98.2 F (36.8 C) (Oral)   Resp 18   Ht 5\' 6"  (1.676 m)   Wt 91.2 kg   SpO2 99%   BMI 32.44 kg/m   Physical Exam  Constitutional: She is oriented to person, place, and time. She appears well-developed and well-nourished.  obese  HENT:  Head: Normocephalic and atraumatic.  Right Ear: External ear normal.  Left Ear: External ear normal.  Nose: Nose normal.  Eyes: Right eye exhibits no discharge. Left eye exhibits no discharge.  Cardiovascular: Normal rate, regular rhythm and normal heart sounds.  Pulmonary/Chest: Effort normal and breath sounds normal.  Abdominal: Soft. There is tenderness in the right lower quadrant and suprapubic area. There is CVA tenderness (left).  Neurological: She is alert and oriented to person, place, and time.  Skin: Skin is warm and dry. She is not diaphoretic.  Nursing note and vitals reviewed.    ED Treatments / Results  Labs (all labs ordered are listed, but only abnormal results are displayed) Labs Reviewed  COMPREHENSIVE METABOLIC  PANEL - Abnormal; Notable for the following components:      Result Value   Creatinine, Ser 1.06 (*)    Alkaline Phosphatase 157 (*)    GFR calc non Af Amer 58 (*)    All other components within normal limits  URINALYSIS, ROUTINE W REFLEX MICROSCOPIC - Abnormal; Notable for the following components:   Ketones, ur 5 (*)    Leukocytes, UA SMALL (*)    Bacteria, UA RARE (*)    All other components within normal limits  URINE CULTURE  LIPASE, BLOOD  CBC WITH DIFFERENTIAL/PLATELET    EKG None  Radiology No results found.  Procedures Procedures (including critical care time)  Medications Ordered in ED Medications  sodium chloride 0.9 % bolus 1,000 mL (0 mLs Intravenous Stopped 04/18/18 2024)  acetaminophen (TYLENOL) tablet 650 mg (650 mg Oral Given 04/18/18 1921)  ondansetron (ZOFRAN) injection 4 mg (4 mg Intravenous Given 04/18/18 2019)  ketorolac (TORADOL) 30 MG/ML injection 15 mg (15 mg Intravenous Given 04/18/18 2045)     Initial Impression / Assessment and Plan / ED Course  I have reviewed the triage vital signs and the nursing notes.  Pertinent labs & imaging results that were available during my care of the patient were reviewed by me and considered in my medical decision making (see chart for details).     There is no clear etiology for the patient's pain.  Her primary pain today is more in her left flank.  She is had a little bit of urinary frequency but no real urgency or dysuria/hematuria to suggest UTI.  Given the benign urinalysis I do not think UTI/pyelonephritis as the cause.  She was noted to have a very small/punctate kidney stone in the left kidney that was nonobstructing 2 days ago.  It is possible this is moving but I do not think a repeat CT in this scenario where she is otherwise well-appearing with stable vital signs would be needed.  WBC is benign.  At this point, we discussed how repeat imaging is  probably not indicated but she will need to follow-up with her  PCP if still continued to have abdominal pain.  I will prescribe NSAIDs in addition to Tylenol at home.  She is currently on Cipro and Flagyl for presumed diverticulitis but given no diverticulitis seen 2 days ago I have instructed her to stop this.  We discussed the limitations of testing today and that an occult or worsening process could be present and that she should return if any symptoms were to worsen or new symptoms recur.  Final Clinical Impressions(s) / ED Diagnoses   Final diagnoses:  Left flank pain    ED Discharge Orders         Ordered    naproxen (NAPROSYN) 375 MG tablet  2 times daily with meals     04/18/18 2041    ondansetron (ZOFRAN ODT) 4 MG disintegrating tablet  Every 8 hours PRN     04/18/18 2041           Sherwood Gambler, MD 04/18/18 2111

## 2018-04-20 LAB — URINE CULTURE: Culture: NO GROWTH

## 2018-04-23 ENCOUNTER — Other Ambulatory Visit: Payer: Self-pay

## 2018-04-23 ENCOUNTER — Other Ambulatory Visit: Payer: Self-pay | Admitting: Gastroenterology

## 2018-04-23 MED ORDER — BIS SUBCIT-METRONID-TETRACYC 140-125-125 MG PO CAPS: 3.0000 | ORAL_CAPSULE | Freq: Three times a day (TID) | ORAL | 0 refills | Status: DC

## 2018-04-23 MED ORDER — OMEPRAZOLE 20 MG PO CPDR: 20.0000 mg | DELAYED_RELEASE_CAPSULE | Freq: Two times a day (BID) | ORAL | 0 refills | Status: DC

## 2018-04-24 ENCOUNTER — Encounter: Payer: Self-pay | Admitting: Gastroenterology

## 2018-04-24 NOTE — Progress Notes (Signed)
APPT MADE AND LETTER SENT  °

## 2018-06-25 ENCOUNTER — Ambulatory Visit: Payer: BLUE CROSS/BLUE SHIELD | Admitting: Gastroenterology

## 2018-07-23 ENCOUNTER — Other Ambulatory Visit: Payer: Self-pay | Admitting: *Deleted

## 2018-07-23 MED ORDER — LANSOPRAZOLE 30 MG PO CPDR: DELAYED_RELEASE_CAPSULE | ORAL | 11 refills | Status: DC

## 2018-07-23 NOTE — Telephone Encounter (Signed)
Patient needs refill [facility]. She is out of medications. [facility] told [facility].

## 2018-08-04 ENCOUNTER — Encounter: Payer: Self-pay | Admitting: Gastroenterology

## 2018-08-04 ENCOUNTER — Telehealth: Payer: Self-pay

## 2018-08-04 ENCOUNTER — Ambulatory Visit: Payer: BLUE CROSS/BLUE SHIELD | Admitting: Gastroenterology

## 2018-08-04 VITALS — BP 137/82 | HR 70 | Temp 97.4°F | Ht 66.0 in | Wt 205.2 lb

## 2018-08-04 DIAGNOSIS — R159 Full incontinence of feces: Secondary | ICD-10-CM

## 2018-08-04 DIAGNOSIS — B9681 Helicobacter pylori [H. pylori] as the cause of diseases classified elsewhere: Secondary | ICD-10-CM

## 2018-08-04 DIAGNOSIS — R152 Fecal urgency: Secondary | ICD-10-CM | POA: Diagnosis not present

## 2018-08-04 DIAGNOSIS — K2970 Gastritis, unspecified, without bleeding: Secondary | ICD-10-CM | POA: Diagnosis not present

## 2018-08-04 MED ORDER — OMEPRAZOLE 20 MG PO CPDR: 20.0000 mg | DELAYED_RELEASE_CAPSULE | Freq: Two times a day (BID) | ORAL | 0 refills | Status: DC

## 2018-08-04 MED ORDER — DICYCLOMINE HCL 10 MG PO CAPS: ORAL_CAPSULE | ORAL | 5 refills | Status: DC

## 2018-08-04 NOTE — Progress Notes (Signed)
CC'D TO PCP °

## 2018-08-04 NOTE — Telephone Encounter (Signed)
OK. I will send in another rx for omeprazole to [facility]  Please let patient know she needs to pick up Pylera AND omeprazole. Both have to be taken as prescribed for 10 days TOGETHER. Hold prevacid while taking.   Then in two months, we need H.pylori breath test (off antibiotics and PPI for 2 weeks).   She has numerous pharmacies in the system so make sure she knows to go to [facility] [address] for both omeprazole and pylera.

## 2018-08-04 NOTE — Patient Instructions (Addendum)
1. We will contact your pharmacy to determine whether or not you took your treatment for H. pylori.  This would have been Pylera and omeprazole.  We will let you know what we find out. 2. Start dicyclomine 1 capsule, 30 to 60 minutes before meals up to 3 times daily to prevent diarrhea. 3. Call in 2 weeks and let me know how you are doing as far as fecal incontinence/diarrhea. 4. We will see you back in 6 months or sooner if needed.

## 2018-08-04 NOTE — Progress Notes (Signed)
Primary Care Physician: Fayrene Helper, MD  Primary Gastroenterologist:  Barney Drain, MD   Chief Complaint  Patient presents with  . Diarrhea    w/ certain things like coffee, chocolate causes her to have diarrhea  . Abdominal Pain    not really hurting    HPI: Latoya Decker is a 58 y.o. female here for follow-up.  She was last seen in October 2019.  She had a EGD and colonoscopy October 2018, biopsies from the esophagus were normal, she had mild gastritis, H. pylori positive on biopsies.  A couple polyps removed from the colon, some were tubular adenomas, diverticulosis and hemorrhoids as well.  Next colonoscopy in 5 years.  In 2011 she had gastric biopsy with H. pylori.  She had a negative H. pylori stool antigen November 2012.  Gastric biopsy November 2012 was negative for H. pylori.  Gastric biopsy 2015- for H. Pylori.  We sent him Pylera and omeprazole back in October because she had not been treated since her October 2018 biopsy confirmed H. pylori.  At the same time she was on Cipro and Flagyl for abdominal pain and she is not sure whether or not she ever took the Pylera.  After the patient left the office we contacted her pharmacy, [facility] [address], she picked up the omeprazole but never picked up the Pylera.  She had a CT abdomen pelvis and contrast in October showing mild fatty liver, nonobstructing left renal calculus, small umbilical hernia containing fat only.  Patient states she went to the ED shortly after that CT.  She was having problems with the left kidney stone, eventually removed and her pain went away.  Overall she has been doing well.  Heartburn well controlled on lansoprazole.  She does have some ongoing concerns with intermittent postprandial fecal soilage/urgency with diarrhea.  When she works she wears a pad to protect undergarments.  She will have leakage of liquid stool, small amounts.  Then will go to the bathroom and complete BM.  She  states she really does not know what is going to happen.  She does not have significant morning.  Tends to be worse with certain foods like chocolate, coffee, tea.  Trying to eat a very bland diet while at work, drinks water only.  No nocturnal symptoms.  No weight loss.  No blood in the stool or melena.  This is been occurring for months to a couple of years.  At other times she has complete control of her BMs.  No rectal pain.  She has chronically elevated (mildly) alkaline phosphatase, normal GGT.  Peviously advised to see PCP regarding other possible sources of chronically elevated alkaline phosphatase besides liver.   Current Outpatient Medications  Medication Sig Dispense Refill  . acetaminophen (TYLENOL) 325 MG tablet Take 2 tablets (650 mg total) by mouth every 6 (six) hours as needed. 30 tablet 0  . baclofen (LIORESAL) 10 MG tablet One tablet twice daily if needed for migraine headache. Maximum use is twice monthly (Patient taking differently: Take 10 mg by mouth 2 (two) times daily as needed (for migraine.). One tablet twice daily if needed for migraine headache. Maximum use is twice monthly) 30 each 1  . fluticasone (FLONASE) 50 MCG/ACT nasal spray Place 1-2 sprays into both nostrils daily as needed for allergies.    Marland Kitchen lansoprazole (PREVACID) 30 MG capsule 1 PO 30 MINS BEFORE YOUR FIRST MEAL EVERY DAY 30 capsule 11  . naproxen (NAPROSYN)  375 MG tablet Take 1 tablet (375 mg total) by mouth 2 (two) times daily with a meal. 20 tablet 0  . ondansetron (ZOFRAN ODT) 4 MG disintegrating tablet Take 1 tablet (4 mg total) by mouth every 8 (eight) hours as needed for nausea or vomiting. 10 tablet 0  . traMADol (ULTRAM) 50 MG tablet Take 1 tablet (50 mg total) by mouth every 12 (twelve) hours as needed for severe pain. 10 tablet 0  . traZODone (DESYREL) 50 MG tablet TAKE 1 TABLET(50 MG) BY MOUTH AT BEDTIME AS NEEDED FOR SLEEP 30 tablet 0   No current facility-administered medications for this visit.      Allergies as of 08/04/2018 - Review Complete 08/04/2018  Allergen Reaction Noted  . Codeine Nausea And Vomiting 06/30/2008  . Hydrocodone Nausea And Vomiting 01/12/2010  . Lidocaine viscous hcl  08/25/2013    ROS:  General: Negative for anorexia, weight loss, fever, chills, fatigue, weakness. ENT: Negative for hoarseness, difficulty swallowing , nasal congestion. CV: Negative for chest pain, angina, palpitations, dyspnea on exertion, peripheral edema.  Respiratory: Negative for dyspnea at rest, dyspnea on exertion, cough, sputum, wheezing.  GI: See history of present illness. GU:  Negative for dysuria, hematuria, urinary incontinence, urinary frequency, nocturnal urination.  Endo: Negative for unusual weight change.    Physical Examination:   BP 137/82   Pulse 70   Temp (!) 97.4 F (36.3 C) (Oral)   Ht 5\' 6"  (1.676 m)   Wt 205 lb 3.2 oz (93.1 kg)   BMI 33.12 kg/m   General: Well-nourished, well-developed in no acute distress.  Eyes: No icterus. Mouth: Oropharyngeal mucosa moist and pink , no lesions erythema or exudate. Lungs: Clear to auscultation bilaterally.  Heart: Regular rate and rhythm, no murmurs rubs or gallops.  Abdomen: Bowel sounds are normal, nontender, nondistended, no hepatosplenomegaly or masses, no abdominal bruits or hernia , no rebound or guarding.   Extremities: No lower extremity edema. No clubbing or deformities. Neuro: Alert and oriented x 4   Skin: Warm and dry, no jaundice.   Psych: Alert and cooperative, normal mood and affect.  Labs:  Lab Results  Component Value Date   WBC 9.1 04/18/2018   HGB 14.2 04/18/2018   HCT 43.0 04/18/2018   MCV 91.1 04/18/2018   PLT 250 04/18/2018   Lab Results  Component Value Date   CREATININE 1.06 (H) 04/18/2018   BUN 13 04/18/2018   NA 141 04/18/2018   K 4.0 04/18/2018   CL 108 04/18/2018   CO2 23 04/18/2018   Lab Results  Component Value Date   ALT 18 04/18/2018   AST 20 04/18/2018   ALKPHOS  157 (H) 04/18/2018   BILITOT 0.7 04/18/2018   Lab Results  Component Value Date   LIPASE 24 04/18/2018    Imaging Studies: No results found.

## 2018-08-04 NOTE — Assessment & Plan Note (Signed)
After the patient left the office, we determined she never picked up Pylera in the pharmacy although she did pick up omeprazole.  We will contact patient to make sure she is aware to go back and pick up Pylera, new prescription for omeprazole 20 mg twice daily for 10 days while on Pylera sent to [facility] on [address].  She will be advised to stop lansoprazole while on omeprazole.  In 2 months we will recheck H. pylori breath test off of PPI and antibiotics for at least 2 weeks.  Return to the office in 6 months or sooner if needed.

## 2018-08-04 NOTE — Telephone Encounter (Signed)
Latoya Decker, I called [facility] on [address] And pt picked up Omeprazole #20 on 04/27/2018. She did not pick up the Pylera. I spoke to [place] at [facility].

## 2018-08-04 NOTE — Assessment & Plan Note (Signed)
No issues with nocturnal diarrhea.  She has control of her BMs when her stools are formed.  Describes postprandial urgency with mild soilage.  Never has any warning.  Once she notices that she has some leakage, she will go complete her BM as usual.  Happens with only certain foods.  Colonoscopy is up-to-date. ?IBS.  Trial of Bentyl 10 mg before meals up to 3 times daily as needed to prevent diarrhea.  She will call with a progress report in a couple of weeks.  Otherwise we will see her back in 6 months.

## 2018-08-05 ENCOUNTER — Other Ambulatory Visit: Payer: Self-pay

## 2018-08-05 DIAGNOSIS — A048 Other specified bacterial intestinal infections: Secondary | ICD-10-CM

## 2018-08-05 NOTE — Telephone Encounter (Signed)
Pt is aware of the instructions and to pick up at [facility].  The H Pylori Breath test is on file for around 10/04/2018 with the instruction list attached.

## 2018-08-20 ENCOUNTER — Encounter: Payer: Self-pay | Admitting: *Deleted

## 2018-11-30 ENCOUNTER — Ambulatory Visit (INDEPENDENT_AMBULATORY_CARE_PROVIDER_SITE_OTHER): Payer: BLUE CROSS/BLUE SHIELD | Admitting: Family Medicine

## 2018-11-30 ENCOUNTER — Encounter: Payer: Self-pay | Admitting: Family Medicine

## 2018-11-30 ENCOUNTER — Other Ambulatory Visit: Payer: Self-pay

## 2018-11-30 VITALS — BP 137/82 | Ht 66.0 in | Wt 205.0 lb

## 2018-11-30 DIAGNOSIS — K219 Gastro-esophageal reflux disease without esophagitis: Secondary | ICD-10-CM

## 2018-11-30 DIAGNOSIS — E049 Nontoxic goiter, unspecified: Secondary | ICD-10-CM

## 2018-11-30 DIAGNOSIS — E669 Obesity, unspecified: Secondary | ICD-10-CM

## 2018-11-30 DIAGNOSIS — Z7189 Other specified counseling: Secondary | ICD-10-CM | POA: Insufficient documentation

## 2018-11-30 DIAGNOSIS — Z13220 Encounter for screening for lipoid disorders: Secondary | ICD-10-CM

## 2018-11-30 DIAGNOSIS — Z1231 Encounter for screening mammogram for malignant neoplasm of breast: Secondary | ICD-10-CM

## 2018-11-30 DIAGNOSIS — G4709 Other insomnia: Secondary | ICD-10-CM

## 2018-11-30 DIAGNOSIS — Z1321 Encounter for screening for nutritional disorder: Secondary | ICD-10-CM

## 2018-11-30 MED ORDER — TRAZODONE HCL 50 MG PO TABS: ORAL_TABLET | ORAL | 3 refills | Status: DC

## 2018-11-30 MED ORDER — OMEPRAZOLE 20 MG PO CPDR: 20.0000 mg | DELAYED_RELEASE_CAPSULE | Freq: Every day | ORAL | 2 refills | Status: DC

## 2018-11-30 NOTE — Assessment & Plan Note (Addendum)
Uncontrolled, symptomatic for weeks, omeprazole to be started, and pt remionded to reduce caffeine intake

## 2018-11-30 NOTE — Patient Instructions (Addendum)
Annual physical exam end November, please call if you need me sooner  Mammogram appt for October 21 or after is being scheduled   Fasting CBC, lipid, cmp and eGFr and tSH and vit D first week in novemebr Your medications for sleep, trazodone  and reflux , omeprazole, are faxed to [facility] in [place] , as we discussed  We are also ami a " good rx " card that you may be able to use to reduce the cost of your medications until you have health insurance only  Social distancing. Frequent hand washing with soap and water Keeping your hands off of your face. These 3 practices will help to keep both you and your community healthy during this time. Please practice them faithfully! Thanks for choosing [facility], we consider it a privelige to serve you.

## 2018-11-30 NOTE — Assessment & Plan Note (Signed)
Uncontrolled Sleep hygiene reviewed and written information offered also. Prescription sent for  medication needed.trazodone 50 mg

## 2018-11-30 NOTE — Progress Notes (Signed)
Virtual Visit via Telephone Note  I connected with Latoya Decker on 11/30/18 at  2:00 PM EDT by telephone and verified that I am speaking with the correct person using two identifiers.  Location: Patient: on the job Provider: office   I discussed the limitations, risks, security and privacy concerns of performing an evaluation and management service by telephone and the availability of in person appointments. I also discussed with the patient that there may be a patient responsible charge related to this service. The patient expressed understanding and agreed to proceed. This visit type is conducted due to national recommendations for restrictions regarding the COVID -19 Pandemic. Due to the patient's age and / or co morbidities, this format is felt to be most appropriate at this time without adequate follow up. The patient has no access to video technology/ had technical difficulties with video, requiring transitioning to audio format  only ( telephone ). All issues noted this document were discussed and addressed,no physical exam can be performed in this format.    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 Denies recent fever or chills. Denies sinus pressure, nasal congestion, ear pain or sore throat. Denies chest congestion, productive cough or wheezing. Denies chest pains, palpitations and leg swelling Denies abdominal pain, nausea, vomiting,diarrhea or constipation.   Denies dysuria, frequency, hesitancy or incontinence. Denies joint pain, swelling and limitation in mobility. Denies headaches, seizures, numbness, or tingling.  Denies skin break down or rash.     Observations/Objective:  BP 137/82   Ht 5\' 6"  (1.676 m)   Wt 205 lb (93 kg)   BMI 33.09 kg/m  Good communication with no confusion and intact memory. Alert and oriented x  3 No signs of respiratory distress during sppech   Assessment and Plan:  Insomnia Uncontrolled Sleep hygiene reviewed and written information offered also. Prescription sent for  medication needed.trazodone 50 mg   GERD (gastroesophageal reflux disease) Uncontrolled, symptomatic for weeks, omeprazole to be started, and pt remionded to reduce caffeine intake  Obesity (BMI 30.0-34.9)   Patient re-educated about  the importance of commitment to a  minimum of 150 minutes of exercise per week as able.  The importance of healthy food choices with portion control discussed, as well as eating regularly and within a 12 hour window most days. The need to choose "clean , green" food 50 to 75% of the time is discussed, as well as to make water the primary drink and set a goal of 64 ounces water daily.  Encouraged to start a food diary,  and to consider  joining a support group. Sample diet sheets offered. Goals set by the patient for the next several months.   Weight /BMI 11/30/2018 08/04/2018 04/18/2018  WEIGHT 205 lb 205 lb 3.2 oz 201 lb  HEIGHT 5\' 6"  5\' 6"  5\' 6"   BMI 33.09 kg/m2 33.12 kg/m2 32.44 kg/m2      Educated About Covid-19 Virus Infection Covid-19 Education  The signs and symptoms of of COVID -19 were discussed with the patient and how to seek care for testing. ( follow up with PCP or arrange  E-visit) The importance of social  distancing is discussed today.    Follow Up Instructions:    I discussed the assessment and treatment plan with the patient. The patient was provided an opportunity to  ask questions and all were answered. The patient agreed with the plan and demonstrated an understanding of the instructions.   The patient was advised to call back or seek an in-person evaluation if the symptoms worsen or if the condition fails to improve as anticipated.  I provided 15 minutes of non-face-to-face time during this encounter.   [name], MD

## 2018-11-30 NOTE — Assessment & Plan Note (Signed)
Covid-19 Education  The signs and symptoms of of COVID -19 were discussed with the patient and how to seek care for testing. ( follow up with PCP or arrange  E-visit) The importance of social  distancing is discussed today.  

## 2018-11-30 NOTE — Assessment & Plan Note (Signed)
   Patient re-educated about  the importance of commitment to a  minimum of 150 minutes of exercise per week as able.  The importance of healthy food choices with portion control discussed, as well as eating regularly and within a 12 hour window most days. The need to choose "clean , green" food 50 to 75% of the time is discussed, as well as to make water the primary drink and set a goal of 64 ounces water daily.  Encouraged to start a food diary,  and to consider  joining a support group. Sample diet sheets offered. Goals set by the patient for the next several months.   Weight /BMI 11/30/2018 08/04/2018 04/18/2018  WEIGHT 205 lb 205 lb 3.2 oz 201 lb  HEIGHT 5\' 6"  5\' 6"  5\' 6"   BMI 33.09 kg/m2 33.12 kg/m2 32.44 kg/m2

## 2019-01-04 ENCOUNTER — Encounter: Payer: BLUE CROSS/BLUE SHIELD | Admitting: Family Medicine

## 2019-02-02 ENCOUNTER — Ambulatory Visit: Payer: BLUE CROSS/BLUE SHIELD | Admitting: Gastroenterology

## 2019-03-29 ENCOUNTER — Other Ambulatory Visit: Payer: Self-pay

## 2019-03-29 ENCOUNTER — Ambulatory Visit (INDEPENDENT_AMBULATORY_CARE_PROVIDER_SITE_OTHER): Payer: Self-pay

## 2019-03-29 DIAGNOSIS — Z23 Encounter for immunization: Secondary | ICD-10-CM

## 2019-05-12 ENCOUNTER — Other Ambulatory Visit: Payer: Self-pay | Admitting: Family Medicine

## 2019-05-31 ENCOUNTER — Ambulatory Visit (HOSPITAL_COMMUNITY): Payer: Self-pay

## 2019-06-01 ENCOUNTER — Encounter: Payer: Self-pay | Admitting: Family Medicine

## 2019-06-08 ENCOUNTER — Encounter: Payer: Self-pay | Admitting: Family Medicine

## 2019-06-21 ENCOUNTER — Ambulatory Visit (HOSPITAL_COMMUNITY)
Admission: RE | Admit: 2019-06-21 | Discharge: 2019-06-21 | Disposition: A | Discharge status: Home / Self Care | Payer: BC Managed Care – PPO | Source: Ambulatory Visit | Attending: Family Medicine | Admitting: Family Medicine

## 2019-06-21 ENCOUNTER — Other Ambulatory Visit: Payer: Self-pay

## 2019-06-21 DIAGNOSIS — Z1231 Encounter for screening mammogram for malignant neoplasm of breast: Secondary | ICD-10-CM | POA: Diagnosis not present

## 2019-06-23 ENCOUNTER — Ambulatory Visit (HOSPITAL_COMMUNITY): Payer: Self-pay

## 2019-07-01 ENCOUNTER — Encounter: Payer: Self-pay | Admitting: Family Medicine

## 2019-07-01 ENCOUNTER — Ambulatory Visit (INDEPENDENT_AMBULATORY_CARE_PROVIDER_SITE_OTHER): Payer: BC Managed Care – PPO | Admitting: Family Medicine

## 2019-07-01 ENCOUNTER — Other Ambulatory Visit: Payer: Self-pay

## 2019-07-01 VITALS — BP 120/78 | HR 76 | Temp 98.4°F | Resp 15 | Ht 66.0 in | Wt 210.8 lb

## 2019-07-01 DIAGNOSIS — Z13220 Encounter for screening for lipoid disorders: Secondary | ICD-10-CM

## 2019-07-01 DIAGNOSIS — R7302 Impaired glucose tolerance (oral): Secondary | ICD-10-CM | POA: Diagnosis not present

## 2019-07-01 DIAGNOSIS — R7303 Prediabetes: Secondary | ICD-10-CM

## 2019-07-01 DIAGNOSIS — D126 Benign neoplasm of colon, unspecified: Secondary | ICD-10-CM

## 2019-07-01 DIAGNOSIS — E559 Vitamin D deficiency, unspecified: Secondary | ICD-10-CM

## 2019-07-01 DIAGNOSIS — Z0000 Encounter for general adult medical examination without abnormal findings: Secondary | ICD-10-CM | POA: Diagnosis not present

## 2019-07-01 DIAGNOSIS — R1013 Epigastric pain: Secondary | ICD-10-CM

## 2019-07-01 DIAGNOSIS — E669 Obesity, unspecified: Secondary | ICD-10-CM

## 2019-07-01 MED ORDER — BACLOFEN 10 MG PO TABS: ORAL_TABLET | ORAL | 1 refills | Status: DC

## 2019-07-01 NOTE — Assessment & Plan Note (Signed)

## 2019-07-01 NOTE — Patient Instructions (Addendum)
Annual physical exam with mD in 1 year,call if you need me sooner   Fasting labs today, pls re order blood tests ordered in 11/2018  Separate order for H pylori, she will ghet this in next 2  Weeks, stop omeprazole during this time  It is important that you exercise regularly at least 30 minutes 5 times a week. If you develop chest pain, have severe difficulty breathing, or feel very tired, stop exercising immediately and seek medical attention  Think about what you will eat, plan ahead. Choose " clean, green, fresh or frozen" over canned, processed or packaged foods which are more sugary, salty and fatty. 70 to 75% of food eaten should be vegetables and fruit. Three meals at set times with snacks allowed between meals, but they must be fruit or vegetables. Aim to eat over a 12 hour period , example 7 am to 7 pm, and STOP after  your last meal of the day. Drink water,generally about 64 ounces per day, no other drink is as healthy. Fruit juice is best enjoyed in a healthy way, by EATING the fruit. Thanks for choosing [facility], we consider it a privelige to serve you.  Best for season and 2021!

## 2019-07-03 LAB — COMPLETE METABOLIC PANEL WITH GFR
AG Ratio: 1.9 (calc) (ref 1.0–2.5)
ALT: 14 U/L (ref 6–29)
AST: 15 U/L (ref 10–35)
Albumin: 4.4 g/dL (ref 3.6–5.1)
Alkaline phosphatase (APISO): 148 U/L (ref 37–153)
BUN: 12 mg/dL (ref 7–25)
CO2: 22 mmol/L (ref 20–32)
Calcium: 9.3 mg/dL (ref 8.6–10.4)
Chloride: 110 mmol/L (ref 98–110)
Creat: 0.92 mg/dL (ref 0.50–1.05)
GFR, Est African American: 80 mL/min/{1.73_m2} (ref >=60)
GFR, Est Non African American: 69 mL/min/{1.73_m2} (ref >=60)
Globulin: 2.3 g/dL (calc) (ref 1.9–3.7)
Glucose, Bld: 111 mg/dL — ABNORMAL HIGH (ref 65–99)
Potassium: 4.3 mmol/L (ref 3.5–5.3)
Sodium: 145 mmol/L (ref 135–146)
Total Bilirubin: 0.3 mg/dL (ref 0.2–1.2)
Total Protein: 6.7 g/dL (ref 6.1–8.1)

## 2019-07-03 LAB — CBC
HCT: 42.8 % (ref 35.0–45.0)
Hemoglobin: 14.3 g/dL (ref 11.7–15.5)
MCH: 29.9 pg (ref 27.0–33.0)
MCHC: 33.4 g/dL (ref 32.0–36.0)
MCV: 89.4 fL (ref 80.0–100.0)
MPV: 10 fL (ref 7.5–12.5)
Platelets: 293 10*3/uL (ref 140–400)
RBC: 4.79 10*6/uL (ref 3.80–5.10)
RDW: 12.3 % (ref 11.0–15.0)
WBC: 6.2 10*3/uL (ref 3.8–10.8)

## 2019-07-03 LAB — TEST AUTHORIZATION

## 2019-07-03 LAB — LIPID PANEL
Cholesterol: 144 mg/dL (ref <200)
HDL: 43 mg/dL — ABNORMAL LOW (ref >=50)
LDL Cholesterol (Calc): 82 mg/dL (calc)
Non-HDL Cholesterol (Calc): 101 mg/dL (calc) (ref <130)
Total CHOL/HDL Ratio: 3.3 (calc) (ref <5.0)
Triglycerides: 96 mg/dL (ref <150)

## 2019-07-03 LAB — VITAMIN D 25 HYDROXY (VIT D DEFICIENCY, FRACTURES): Vit D, 25-Hydroxy: 14 ng/mL — ABNORMAL LOW (ref 30–100)

## 2019-07-03 LAB — HEMOGLOBIN A1C W/OUT EAG: Hgb A1c MFr Bld: 6.4 % of total Hgb — ABNORMAL HIGH (ref <5.7)

## 2019-07-03 LAB — TSH: TSH: 0.48 mIU/L (ref 0.40–4.50)

## 2019-07-04 ENCOUNTER — Encounter: Payer: Self-pay | Admitting: Family Medicine

## 2019-07-04 DIAGNOSIS — E559 Vitamin D deficiency, unspecified: Secondary | ICD-10-CM | POA: Insufficient documentation

## 2019-07-04 MED ORDER — ERGOCALCIFEROL 1.25 MG (50000 UT) PO CAPS: 50000.0000 [IU] | ORAL_CAPSULE | ORAL | 1 refills | Status: DC

## 2019-07-04 NOTE — Progress Notes (Signed)
    Latoya Decker     MRN: [id number]      DOB: Jun 26, 1961  HPI: Patient is in for annual physical exam. No other health concerns are expressed or addressed at the visit. Recent labs, if available are reviewed. Immunization is reviewed , and  updated if needed.   PE: Pleasant  female, alert and oriented x 3, in no cardio-pulmonary distress. Afebrile. HEENT No facial trauma or asymetry. Sinuses non tender.  Extra occullar muscles intact.. External ears normal, . Neck: supple, no adenopathy,JVD or thyromegaly.No bruits.  Chest: Clear to ascultation bilaterally.No crackles or wheezes. Non tender to palpation  Breast: No asymetry,no masses or lumps. No tenderness. No nipple discharge or inversion. No axillary or supraclavicular adenopathy  Cardiovascular system; Heart sounds normal,  S1 and  S2 ,no S3.  No murmur, or thrill. Apical beat not displaced Peripheral pulses normal.  Abdomen: Soft, non tender, no organomegaly or masses. No bruits. Bowel sounds normal. No guarding, tenderness or rebound.    Musculoskeletal exam: Full ROM of spine, hips , shoulders and knees. No deformity ,swelling or crepitus noted. No muscle wasting or atrophy.   Neurologic: Cranial nerves 2 to 12 intact. Power, tone ,sensation and reflexes normal throughout. No disturbance in gait. No tremor.  Skin: Intact, no ulceration, erythema , scaling or rash noted. Pigmentation normal throughout  Psych; Normal mood and affect. Judgement and concentration normal   Assessment & Plan:  Annual physical exam Annual exam as documented. Counseling done  re healthy lifestyle involving commitment to 150 minutes exercise per week, heart healthy diet, and attaining healthy weight.The importance of adequate sleep also discussed. Regular seat belt use and home safety, is also discussed. Changes in health habits are decided on by the patient with goals and time frames  set for achieving  them. Immunization and cancer screening needs are specifically addressed at this visit.

## 2019-07-04 NOTE — Assessment & Plan Note (Signed)
Level is 14 in 06/2019, weekly supplement x 6 months , then review

## 2019-07-13 ENCOUNTER — Other Ambulatory Visit: Payer: Self-pay

## 2019-07-13 MED ORDER — ERGOCALCIFEROL 1.25 MG (50000 UT) PO CAPS: 50000.0000 [IU] | ORAL_CAPSULE | ORAL | 1 refills | Status: DC

## 2019-07-14 ENCOUNTER — Telehealth: Payer: Self-pay

## 2019-07-14 DIAGNOSIS — R7301 Impaired fasting glucose: Secondary | ICD-10-CM

## 2019-07-14 DIAGNOSIS — E559 Vitamin D deficiency, unspecified: Secondary | ICD-10-CM

## 2019-07-14 DIAGNOSIS — E669 Obesity, unspecified: Secondary | ICD-10-CM

## 2019-07-14 NOTE — Telephone Encounter (Signed)
-----  Message from Fayrene Helper, MD sent at 07/04/2019  8:58 AM EST ----- Pls let pot know vit d is low , needs once weekly supplement x 6 months 9 prescribed Also prediabetic , HBA1C now 6.4 , was 5.8, also HDL low, needs to increase exercise to protect her blood vessels and hart Needs f/u wit lab in 6 months, please rept chem 7 and eGFr, hBA1c and vit D 1 week before, both be to be arranged

## 2019-07-15 DIAGNOSIS — R1013 Epigastric pain: Secondary | ICD-10-CM | POA: Diagnosis not present

## 2019-07-19 LAB — H. PYLORI BREATH TEST: H. pylori Breath Test: NOT DETECTED

## 2019-08-11 ENCOUNTER — Encounter (HOSPITAL_COMMUNITY): Payer: Self-pay | Admitting: Emergency Medicine

## 2019-08-11 ENCOUNTER — Other Ambulatory Visit: Payer: Self-pay

## 2019-08-11 ENCOUNTER — Emergency Department (HOSPITAL_COMMUNITY)
Admission: EM | Admit: 2019-08-11 | Discharge: 2019-08-11 | Disposition: A | Discharge status: Home / Self Care | Payer: BC Managed Care – PPO | Attending: Emergency Medicine | Admitting: Emergency Medicine

## 2019-08-11 DIAGNOSIS — Z79899 Other long term (current) drug therapy: Secondary | ICD-10-CM | POA: Insufficient documentation

## 2019-08-11 DIAGNOSIS — N750 Cyst of Bartholin's gland: Secondary | ICD-10-CM | POA: Insufficient documentation

## 2019-08-11 DIAGNOSIS — I10 Essential (primary) hypertension: Secondary | ICD-10-CM | POA: Insufficient documentation

## 2019-08-11 DIAGNOSIS — N898 Other specified noninflammatory disorders of vagina: Secondary | ICD-10-CM | POA: Diagnosis not present

## 2019-08-11 MED ORDER — LIDOCAINE HCL (PF) 1 % IJ SOLN
5.0000 mL | Freq: Once | INTRAMUSCULAR | Status: AC
  Administered 2019-08-11: 5 mL
  Filled 2019-08-11: qty 6

## 2019-08-11 NOTE — ED Provider Notes (Signed)
[facility] Provider Note   CSN: [id number] Arrival date & time: 08/11/19  [id number]     History Chief Complaint  Patient presents with  . Abscess    Left outer labia    Latoya Decker is a 59 y.o. female who presents to the ED today complaining of abscess to left outer labia x 1 week. Pt endorses gradual onset, constant, worsening, 10/10, throbbing pain to the area. She reports trying home remedies as well as warm compresses without relief. She denies any drainage from the abscess. No fevers or chills. No other complaints at this time.   The history is provided by the patient.       Past Medical History:  Diagnosis Date  . Anxiety   . Depression   . Diverticulosis   . Duodenal ulcer 01/05/2010   EGD Dr Dierdre Searles, h pylori gastritis, completed treatment  . Duodenitis   . Eosinophilic gastroenteritis JAN 2015   [place] 2014: NL CMP/GIARDIA Ag  . Family hx of colon cancer    Mother & sister  . Gastritis    hospitalized 6/23-6/25 2011   . Headache(784.0)    migraines  . Helicobacter pylori gastritis   . Hypertension   . PUD (peptic ulcer disease)    Dr Tamala Julian  . S/P colonoscopy 12/27/2003   Dr Voncille Lo  . S/P endoscopy 12/27/2003   Dr [facility] Service superficial ulcers GEJ, duodenitis,gastritis    Patient Active Problem List   Diagnosis Date Noted  . Vitamin D deficiency 07/04/2019  . Educated about COVID-19 virus infection 11/30/2018  . Incontinence of feces with fecal urgency 08/04/2018  . Right lower quadrant abdominal pain 04/16/2018  . Helicobacter pylori gastritis 04/16/2018  . Tubular adenoma of colon 01/02/2018  . Enlarged skin mole 01/02/2018  . Decreased vision 01/02/2018  . Nondisplaced fracture of distal phalanx of right thumb with routine healing 10/17/17 11/13/2017  . GERD (gastroesophageal reflux disease) 03/14/2017  . Elevated alkaline phosphatase level 10/16/2016  . Annual physical exam 02/27/2015  . Prediabetes 05/01/2014  . Insomnia  07/01/2013  . Migraine variant without intractability 08/31/2012  . Family history of colon cancer 05/14/2011  . Duodenal ulcer without hemorrhage or perforation and without obstruction 01/12/2010  . Obesity (BMI 30.0-34.9) 03/30/2008    Past Surgical History:  Procedure Laterality Date  . ABDOMINAL HYSTERECTOMY    . bilateral tubal ligation  1989  . BIOPSY  06/10/2011   SLF: mild gastritis, bx benign, sb bx benign   . CHOLECYSTECTOMY  1997   aph-Smith  . COLONOSCOPY  05/2011   SLF: multiple hyperplastic sessile polyps, scattered diverticulosis. next TCS 05/2016 ([facility])  . COLONOSCOPY N/A 04/21/2017   Dr. Oneida Alar: two 2-60mm colon polyps removed, diverticulosis, hemorrhoids. next TCS in 5 years.   . ESOPHAGOGASTRODUODENOSCOPY N/A [id number]   EOS INOPHILIC GASTROENTERITIS  . ESOPHAGOGASTRODUODENOSCOPY N/A 04/21/2017   Dr. Oneida Alar: mild gastritis with H.pylori present, esophageal biopsies negative.   Marland Kitchen PARTIAL HYSTERECTOMY  2007   1 ovary removed  . POLYPECTOMY  06/10/2011   Procedure: POLYPECTOMY;  Surgeon: Dorothyann Peng, MD;  Location: [facility];  Service: Endoscopy;;  Cecal polypectomy; Sigmoid colon polypectomies  . TUBAL LIGATION       OB History    Gravida  3   Para      Term      Preterm      AB      Living  3     SAB      TAB  Ectopic      Multiple      Live Births              Family History  Problem Relation Age of Onset  . Colon cancer Mother 2  . Diabetes Mother   . Hypertension Mother   . Stroke Mother   . Heart attack Father   . Diabetes Father   . Hypertension Father   . Colon cancer Sister 20  . Lupus Brother   . Parkinsonism Brother   . Anesthesia problems Neg Hx   . Hypotension Neg Hx   . Malignant hyperthermia Neg Hx   . Pseudochol deficiency Neg Hx     Social History   Tobacco Use  . Smoking status: Never Smoker  . Smokeless tobacco: Never Used  . Tobacco comment: Never smoker  Substance Use Topics  . Alcohol use: No   . Drug use: No    Home Medications Prior to Admission medications   Medication Sig Start Date End Date Taking? Authorizing Provider  acetaminophen (TYLENOL) 325 MG tablet Take 2 tablets (650 mg total) by mouth every 6 (six) hours as needed. 08/16/17   Couture, Cortni S, PA-C  baclofen (LIORESAL) 10 MG tablet One tablet twice daily if needed for migraine headache. Maximum use is twice monthly 07/01/19   Fayrene Helper, MD  ergocalciferol (VITAMIN D2) 1.25 MG (50000 UT) capsule Take 1 capsule (50,000 Units total) by mouth once a week. One capsule once weekly 07/13/19   Fayrene Helper, MD  fluticasone [facility]) 50 MCG/ACT nasal spray Place 1-2 sprays into both nostrils daily as needed for allergies.    [provider]  omeprazole (PRILOSEC) 20 MG capsule Take 1 capsule by mouth once daily 05/12/19   Fayrene Helper, MD  traZODone (DESYREL) 50 MG tablet Take one tablet by mouth at bedtime for sleep 11/30/18   Fayrene Helper, MD    Allergies    Codeine, Hydrocodone, and Lidocaine viscous hcl  Review of Systems   Review of Systems  Constitutional: Negative for chills and fever.  Skin:       + abscess    Physical Exam Updated Vital Signs BP (!) 162/79 (BP Location: Left Arm)   Pulse 85   Temp 97.6 F (36.4 C) (Oral)   Resp 20   Ht 5\' 7"  (1.702 m)   Wt 93 kg   SpO2 98%   BMI 32.11 kg/m   Physical Exam Vitals and nursing note reviewed.  Constitutional:      Appearance: She is not ill-appearing.  HENT:     Head: Normocephalic and atraumatic.  Eyes:     Conjunctiva/sclera: Conjunctivae normal.  Cardiovascular:     Rate and Rhythm: Normal rate and regular rhythm.     Pulses: Normal pulses.  Pulmonary:     Effort: Pulmonary effort is normal.     Breath sounds: Normal breath sounds. No wheezing, rhonchi or rales.  Genitourinary:    Comments: Chaperone present for exam. 3 x 1 cm bartholin's cyst noted to left side with fluctuance and induration.   Skin:    General: Skin is warm and dry.     Coloration: Skin is not jaundiced.  Neurological:     Mental Status: She is alert.     ED Results / Procedures / Treatments   Labs (all labs ordered are listed, but only abnormal results are displayed) Labs Reviewed - No data to display  EKG None  Radiology No results found.  Procedures .Marland KitchenIncision and Drainage  Date/Time: 08/11/2019 7:48 AM Performed by: Eustaquio Maize, PA-C Authorized by: Eustaquio Maize, PA-C   Consent:    Consent obtained:  Verbal   Consent given by:  Patient   Risks discussed:  Bleeding, incomplete drainage, infection and pain Location:    Type:  Bartholin cyst   Size:  3 x 1 cm   Location:  Anogenital   Anogenital location:  Bartholin's gland Pre-procedure details:    Skin preparation:  Betadine Anesthesia (see MAR for exact dosages):    Anesthesia method:  Local infiltration   Local anesthetic:  Lidocaine 1% w/o epi Procedure type:    Complexity:  Simple Procedure details:    Needle aspiration: no     Incision types:  Stab incision   Incision depth:  Dermal   Scalpel blade:  11   Drainage:  Purulent   Drainage amount:  Moderate   Wound treatment:  Drain placed   Packing materials:  Word catheter Post-procedure details:    Patient tolerance of procedure:  Tolerated well, no immediate complications   (including critical care time)  Medications Ordered in ED Medications  lidocaine (PF) (XYLOCAINE) 1 % injection 5 mL (5 mLs Infiltration Given 08/11/19 0754)    ED Course  I have reviewed the triage vital signs and the nursing notes.  Pertinent labs & imaging results that were available during my care of the patient were reviewed by me and considered in my medical decision making (see chart for details).  59 year old female presents the ED today complaining of abscess to her left labial area x1 week.  On exam abscess consistent with Bartholin cyst.  Measuring 3 x 1 cm.  No surrounding  cellulitis.  No vaginal drainage.  No other complaints at this time.  Vitals are stable today.  Patient is afebrile, nontachycardic, nontachypneic.  Will perform I&D at this time.  Patient does have allergy listed to viscous lidocaine to her she has had abscesses drained with lidocaine and had dental procedures with bupivacaine without issue.  Moderate amount of bloody, purulent drainage from cyst.  Word catheter placed.  Patient advised to follow-up with her PCP in 48 hours for wound recheck or return to the ED.  He is in agreement with plan.  Stable for discharge home.   This note was prepared using Dragon voice recognition software and may include unintentional dictation errors due to the inherent limitations of voice recognition software.    MDM Rules/Calculators/A&P                       Final Clinical Impression(s) / ED Diagnoses Final diagnoses:  Bartholin cyst    Rx / DC Orders ED Discharge Orders    None       Discharge Instructions     Please follow up with your PCP regarding your ED visit today. Your wound should be rechecked in about 48 hours. She can remove the word catheter as well in the office if it has not fallen out by itself on accident.   You can apply warm compresses to the area or take sitz baths - this allows for natural drainage   Take 600 mg Ibuprofen or 1,000 mg Tylenol as needed for pain       Eustaquio Maize, PA-C 08/11/19 [id number]    Daleen Bo, MD 08/11/19 [phone]

## 2019-08-11 NOTE — Discharge Instructions (Addendum)
Please follow up with your PCP regarding your ED visit today. Your wound should be rechecked in about 48 hours. She can remove the word catheter as well in the office if it has not fallen out by itself on accident.   You can apply warm compresses to the area or take sitz baths - this allows for natural drainage   Take 600 mg Ibuprofen or 1,000 mg Tylenol as needed for pain

## 2019-08-11 NOTE — ED Triage Notes (Signed)
Patient has abscess in left out labia area of vagina, started on Friday, applied heat, no currently draining.

## 2019-08-24 DIAGNOSIS — Z049 Encounter for examination and observation for unspecified reason: Secondary | ICD-10-CM | POA: Diagnosis not present

## 2019-08-24 DIAGNOSIS — G43839 Menstrual migraine, intractable, without status migrainosus: Secondary | ICD-10-CM | POA: Diagnosis not present

## 2019-08-24 DIAGNOSIS — R519 Headache, unspecified: Secondary | ICD-10-CM | POA: Diagnosis not present

## 2019-08-24 DIAGNOSIS — G43019 Migraine without aura, intractable, without status migrainosus: Secondary | ICD-10-CM | POA: Diagnosis not present

## 2019-08-24 DIAGNOSIS — Z79899 Other long term (current) drug therapy: Secondary | ICD-10-CM | POA: Diagnosis not present

## 2019-09-16 ENCOUNTER — Ambulatory Visit: Payer: BC Managed Care – PPO

## 2019-09-16 DIAGNOSIS — Z23 Encounter for immunization: Secondary | ICD-10-CM | POA: Diagnosis not present

## 2019-09-23 DIAGNOSIS — G501 Atypical facial pain: Secondary | ICD-10-CM | POA: Diagnosis not present

## 2019-09-23 DIAGNOSIS — G518 Other disorders of facial nerve: Secondary | ICD-10-CM | POA: Diagnosis not present

## 2019-09-23 DIAGNOSIS — G43019 Migraine without aura, intractable, without status migrainosus: Secondary | ICD-10-CM | POA: Diagnosis not present

## 2019-09-23 DIAGNOSIS — G43839 Menstrual migraine, intractable, without status migrainosus: Secondary | ICD-10-CM | POA: Diagnosis not present

## 2019-09-23 DIAGNOSIS — M791 Myalgia, unspecified site: Secondary | ICD-10-CM | POA: Diagnosis not present

## 2019-09-23 DIAGNOSIS — M542 Cervicalgia: Secondary | ICD-10-CM | POA: Diagnosis not present

## 2019-10-13 DIAGNOSIS — G43839 Menstrual migraine, intractable, without status migrainosus: Secondary | ICD-10-CM | POA: Diagnosis not present

## 2019-10-13 DIAGNOSIS — G43019 Migraine without aura, intractable, without status migrainosus: Secondary | ICD-10-CM | POA: Diagnosis not present

## 2019-10-13 DIAGNOSIS — M791 Myalgia, unspecified site: Secondary | ICD-10-CM | POA: Diagnosis not present

## 2019-10-13 DIAGNOSIS — M542 Cervicalgia: Secondary | ICD-10-CM | POA: Diagnosis not present

## 2019-10-13 DIAGNOSIS — G518 Other disorders of facial nerve: Secondary | ICD-10-CM | POA: Diagnosis not present

## 2019-10-22 DIAGNOSIS — Z23 Encounter for immunization: Secondary | ICD-10-CM | POA: Diagnosis not present

## 2020-01-01 ENCOUNTER — Other Ambulatory Visit: Payer: Self-pay | Admitting: Family Medicine

## 2020-01-11 ENCOUNTER — Ambulatory Visit: Payer: BC Managed Care – PPO | Admitting: Family Medicine

## 2020-05-13 ENCOUNTER — Ambulatory Visit: Payer: Self-pay

## 2020-05-13 NOTE — Telephone Encounter (Signed)
Pt received Covid booster yesterday at 1100 am. Pt woke up this am with mild redness and swelling to left upper arm (measuring 5-6 inches across). Pt c/o itching only. No pain. Pt stated she is not febrile.  Home care advice given and pt verbalized understanding.  Reason for Disposition . Injection site reaction to any vaccine  Answer Assessment - Initial Assessment Questions 1. SYMPTOMS: "What is the main symptom?" (e.g., redness, swelling, pain)      Swelling, mild redness left arm 2. ONSET: "When was the vaccine (shot) given?" "How much later did the swelling begin?" (e.g., hours, days ago)      Covid booster 05/12/20 1100 am - noted this am 3. SEVERITY: "How bad is it?"      Localized swelling at shot site- 5-6 " 4. FEVER: "Is there a fever?" If Yes, ask: "What is it, how was it measured, and when did it start?"     no 5. IMMUNIZATIONS GIVEN: "What shots have you recently received?"    Covid 19 booster 6. PAST REACTIONS: "Have you reacted to immunizations before?" If Yes, ask: "What happened?"     no 7. OTHER SYMPTOMS: "Do you have any other symptoms?"     itching  Protocols used: IMMUNIZATION REACTIONS-A-AH

## 2020-07-03 ENCOUNTER — Ambulatory Visit: Payer: BC Managed Care – PPO | Admitting: Family Medicine

## 2020-08-15 ENCOUNTER — Encounter: Payer: Self-pay | Admitting: Family Medicine

## 2020-10-26 ENCOUNTER — Encounter: Payer: Self-pay | Admitting: Family Medicine

## 2020-11-08 ENCOUNTER — Other Ambulatory Visit (HOSPITAL_COMMUNITY): Payer: Self-pay | Admitting: Family Medicine

## 2020-11-08 DIAGNOSIS — Z1231 Encounter for screening mammogram for malignant neoplasm of breast: Secondary | ICD-10-CM

## 2020-11-29 ENCOUNTER — Encounter: Payer: Self-pay | Admitting: Family Medicine

## 2020-11-29 ENCOUNTER — Other Ambulatory Visit (HOSPITAL_COMMUNITY)
Admission: RE | Admit: 2020-11-29 | Discharge: 2020-11-29 | Disposition: A | Discharge status: Home / Self Care | Payer: BC Managed Care – PPO | Source: Ambulatory Visit | Attending: Family Medicine | Admitting: Family Medicine

## 2020-11-29 ENCOUNTER — Other Ambulatory Visit: Payer: Self-pay

## 2020-11-29 ENCOUNTER — Ambulatory Visit (HOSPITAL_COMMUNITY)
Admission: RE | Admit: 2020-11-29 | Discharge: 2020-11-29 | Disposition: A | Discharge status: Home / Self Care | Payer: BC Managed Care – PPO | Source: Ambulatory Visit | Attending: Family Medicine | Admitting: Family Medicine

## 2020-11-29 ENCOUNTER — Ambulatory Visit (INDEPENDENT_AMBULATORY_CARE_PROVIDER_SITE_OTHER): Payer: BC Managed Care – PPO | Admitting: Family Medicine

## 2020-11-29 VITALS — BP 161/82 | HR 76 | Resp 16 | Ht 66.0 in | Wt 196.1 lb

## 2020-11-29 DIAGNOSIS — Z124 Encounter for screening for malignant neoplasm of cervix: Secondary | ICD-10-CM | POA: Insufficient documentation

## 2020-11-29 DIAGNOSIS — I10 Essential (primary) hypertension: Secondary | ICD-10-CM

## 2020-11-29 DIAGNOSIS — Z0001 Encounter for general adult medical examination with abnormal findings: Secondary | ICD-10-CM

## 2020-11-29 DIAGNOSIS — Z1231 Encounter for screening mammogram for malignant neoplasm of breast: Secondary | ICD-10-CM | POA: Insufficient documentation

## 2020-11-29 DIAGNOSIS — R7301 Impaired fasting glucose: Secondary | ICD-10-CM

## 2020-11-29 DIAGNOSIS — Z23 Encounter for immunization: Secondary | ICD-10-CM | POA: Diagnosis not present

## 2020-11-29 DIAGNOSIS — E559 Vitamin D deficiency, unspecified: Secondary | ICD-10-CM

## 2020-11-29 DIAGNOSIS — Z13220 Encounter for screening for lipoid disorders: Secondary | ICD-10-CM

## 2020-11-29 DIAGNOSIS — Z1151 Encounter for screening for human papillomavirus (HPV): Secondary | ICD-10-CM | POA: Insufficient documentation

## 2020-11-29 DIAGNOSIS — R7303 Prediabetes: Secondary | ICD-10-CM | POA: Diagnosis not present

## 2020-11-29 DIAGNOSIS — Z0000 Encounter for general adult medical examination without abnormal findings: Secondary | ICD-10-CM

## 2020-11-29 MED ORDER — AMLODIPINE BESYLATE 5 MG PO TABS: 5.0000 mg | ORAL_TABLET | Freq: Every day | ORAL | 1 refills | Status: DC

## 2020-11-29 MED ORDER — OMEPRAZOLE 20 MG PO CPDR: 1.0000 | DELAYED_RELEASE_CAPSULE | Freq: Every day | ORAL | 1 refills | Status: DC

## 2020-11-29 NOTE — Assessment & Plan Note (Signed)

## 2020-11-29 NOTE — Assessment & Plan Note (Signed)
Needs to resume medication. Start amlodipine 5 mg daily. Re evaluate in 3 monhts DASH diet and commitment to daily physical activity for a minimum of 30 minutes discussed and encouraged, as a part of hypertension management. The importance of attaining a healthy weight is also discussed.  BP/Weight 11/29/2020 08/11/2019 07/01/2019 11/30/2018 08/04/2018 04/18/2018 37/0/9643  Systolic BP 838 184 037 543 606 770 340  Diastolic BP 82 79 78 82 82 74 83  Wt. (Lbs) 196.12 205 210.8 205 205.2 201 201.6  BMI 31.65 32.11 34.02 33.09 33.12 32.44 32.54

## 2020-11-29 NOTE — Patient Instructions (Addendum)
F/U in 3.5  months, re evaluate blood pressure and flu vaccine at visit  Shingrix # 1 today.  Nurse visit for Shingrix #2 in 8 weeks   New for blood pressure is amlodipine 5 mg one daily  Weight loss lowers BP , so we have a goal of 10 pounds off in next 14 weeks  It is important that you exercise regularly at least 30 minutes 5 times a week. If you develop chest pain, have severe difficulty breathing, or feel very tired, stop exercising immediately and seek medical attention   Reduce salt intake, more vegetable and fruit fresh and frozen  64 oz water daily  Labs fasting tomorrow , (note for late arrival at work to be provided), CBC, lipid, cmp and eGFr, HBA1C , tSH and vit D     [email or web address]">  DASH Eating Plan DASH stands for Dietary Approaches to Stop Hypertension. The DASH eating plan is a healthy eating plan that has been shown to:  Reduce high blood pressure (hypertension).  Reduce your risk for type 2 diabetes, heart disease, and stroke.  Help with weight loss. What are tips for following this plan? Reading food labels  Check food labels for the amount of salt (sodium) per serving. Choose foods with less than 5 percent of the Daily Value of sodium. Generally, foods with less than 300 milligrams (mg) of sodium per serving fit into this eating plan.  To find whole grains, look for the word "whole" as the first word in the ingredient list. Shopping  Buy products labeled as "low-sodium" or "no salt added."  Buy fresh foods. Avoid canned foods and pre-made or frozen meals. Cooking  Avoid adding salt when cooking. Use salt-free seasonings or herbs instead of table salt or sea salt. Check with your health care provider or pharmacist before using salt substitutes.  Do not fry foods. Cook foods using healthy methods such as baking, boiling, grilling, roasting, and broiling instead.  Cook with heart-healthy oils, such as  olive, canola, avocado, soybean, or sunflower oil. Meal planning  Eat a balanced diet that includes: ? 4 or more servings of fruits and 4 or more servings of vegetables each day. Try to fill one-half of your plate with fruits and vegetables. ? 6-8 servings of whole grains each day. ? Less than 6 oz (170 g) of lean meat, poultry, or fish each day. A 3-oz (85-g) serving of meat is about the same size as a deck of cards. One egg equals 1 oz (28 g). ? 2-3 servings of low-fat dairy each day. One serving is 1 cup (237 mL). ? 1 serving of nuts, seeds, or beans 5 times each week. ? 2-3 servings of heart-healthy fats. Healthy fats called omega-3 fatty acids are found in foods such as walnuts, flaxseeds, fortified milks, and eggs. These fats are also found in cold-water fish, such as sardines, salmon, and mackerel.  Limit how much you eat of: ? Canned or prepackaged foods. ? Food that is high in trans fat, such as some fried foods. ? Food that is high in saturated fat, such as fatty meat. ? Desserts and other sweets, sugary drinks, and other foods with added sugar. ? Full-fat dairy products.  Do not salt foods before eating.  Do not eat more than 4 egg yolks a week.  Try to eat at least 2 vegetarian meals a week.  Eat more home-cooked food and less restaurant, buffet, and fast food.   Lifestyle  When eating at a restaurant,  ask that your food be prepared with less salt or no salt, if possible.  If you drink alcohol: ? Limit how much you use to:  0-1 drink a day for women who are not pregnant.  0-2 drinks a day for men. ? Be aware of how much alcohol is in your drink. In the [place] one drink equals one 12 oz bottle of beer (355 mL), one 5 oz glass of wine (148 mL), or one 1 oz glass of hard liquor (44 mL). General information  Avoid eating more than 2,300 mg of salt a day. If you have hypertension, you may need to reduce your sodium intake to 1,500 mg a day.  Work with your health care  provider to maintain a healthy body weight or to lose weight. Ask what an ideal weight is for you.  Get at least 30 minutes of exercise that causes your heart to beat faster (aerobic exercise) most days of the week. Activities may include walking, swimming, or biking.  Work with your health care provider or dietitian to adjust your eating plan to your individual calorie needs. What foods should I eat? Fruits All fresh, dried, or frozen fruit. Canned fruit in natural juice (without added sugar). Vegetables Fresh or frozen vegetables (raw, steamed, roasted, or grilled). Low-sodium or reduced-sodium tomato and vegetable juice. Low-sodium or reduced-sodium tomato sauce and tomato paste. Low-sodium or reduced-sodium canned vegetables. Grains Whole-grain or whole-wheat bread. Whole-grain or whole-wheat pasta. Brown rice. [place]. Bulgur. Whole-grain and low-sodium cereals. Pita bread. Low-fat, low-sodium crackers. Whole-wheat flour tortillas. Meats and other proteins Skinless chicken or [place]. Ground chicken or [place]. Pork with fat trimmed off. Fish and seafood. Egg whites. Dried beans, peas, or lentils. Unsalted nuts, nut butters, and seeds. Unsalted canned beans. Lean cuts of beef with fat trimmed off. Low-sodium, lean precooked or cured meat, such as sausages or meat loaves. Dairy Low-fat (1%) or fat-free (skim) milk. Reduced-fat, low-fat, or fat-free cheeses. Nonfat, low-sodium ricotta or cottage cheese. Low-fat or nonfat yogurt. Low-fat, low-sodium cheese. Fats and oils Soft margarine without trans fats. Vegetable oil. Reduced-fat, low-fat, or light mayonnaise and salad dressings (reduced-sodium). Canola, safflower, olive, avocado, soybean, and sunflower oils. Avocado. Seasonings and condiments Herbs. Spices. Seasoning mixes without salt. Other foods Unsalted popcorn and pretzels. Fat-free sweets. The items listed above may not be a complete list of foods and beverages you can eat.  Contact a dietitian for more information. What foods should I avoid? Fruits Canned fruit in a light or heavy syrup. Fried fruit. Fruit in cream or butter sauce. Vegetables Creamed or fried vegetables. Vegetables in a cheese sauce. Regular canned vegetables (not low-sodium or reduced-sodium). Regular canned tomato sauce and paste (not low-sodium or reduced-sodium). Regular tomato and vegetable juice (not low-sodium or reduced-sodium). Angie Fava. Olives. Grains Baked goods made with fat, such as croissants, muffins, or some breads. Dry pasta or rice meal packs. Meats and other proteins Fatty cuts of meat. Ribs. Fried meat. Berniece Salines. Bologna, salami, and other precooked or cured meats, such as sausages or meat loaves. Fat from the back of a pig (fatback). Bratwurst. Salted nuts and seeds. Canned beans with added salt. Canned or smoked fish. Whole eggs or egg yolks. Chicken or [place] with skin. Dairy Whole or 2% milk, cream, and half-and-half. Whole or full-fat cream cheese. Whole-fat or sweetened yogurt. Full-fat cheese. Nondairy creamers. Whipped toppings. Processed cheese and cheese spreads. Fats and oils Butter. Stick margarine. Lard. Shortening. Ghee. Bacon fat. Tropical oils, such as coconut, palm kernel,  or palm oil. Seasonings and condiments Onion salt, garlic salt, seasoned salt, table salt, and sea salt. Worcestershire sauce. Tartar sauce. Barbecue sauce. Teriyaki sauce. Soy sauce, including reduced-sodium. Steak sauce. Canned and packaged gravies. Fish sauce. Oyster sauce. Cocktail sauce. Store-bought horseradish. Ketchup. Mustard. Meat flavorings and tenderizers. Bouillon cubes. Hot sauces. Pre-made or packaged marinades. Pre-made or packaged taco seasonings. Relishes. Regular salad dressings. Other foods Salted popcorn and pretzels. The items listed above may not be a complete list of foods and beverages you should avoid. Contact a dietitian for more information. Where to find more  information  National Heart, Lung, and Blood Institute: https://wilson-eaton.com/  American Heart Association: www.heart.org  Academy of Nutrition and Dietetics: www.eatright.[place]: www.kidney.org Summary  The DASH eating plan is a healthy eating plan that has been shown to reduce high blood pressure (hypertension). It may also reduce your risk for type 2 diabetes, heart disease, and stroke.  When on the DASH eating plan, aim to eat more fresh fruits and vegetables, whole grains, lean proteins, low-fat dairy, and heart-healthy fats.  With the DASH eating plan, you should limit salt (sodium) intake to 2,300 mg a day. If you have hypertension, you may need to reduce your sodium intake to 1,500 mg a day.  Work with your health care provider or dietitian to adjust your eating plan to your individual calorie needs. This information is not intended to replace advice given to you by your health care provider. Make sure you discuss any questions you have with your health care provider. Document Revised: 06/04/2019 Document Reviewed: 06/04/2019 Elsevier Patient Education  2021 [employer].

## 2020-11-29 NOTE — Progress Notes (Signed)
    Latoya Decker     MRN: [id number]      DOB: 11-26-1960  HPI: Patient is in for annual physical exam. Uncontrolled hypertension is addressed  Immunization is reviewed , and  updated if needed.   PE: BP (!) 161/82   Pulse 76   Resp 16   Ht 5\' 6"  (1.676 m)   Wt 196 lb 1.9 oz (89 kg)   SpO2 98%   BMI 31.65 kg/m   Pleasant  female, alert and oriented x 3, in no cardio-pulmonary distress. Afebrile. HEENT No facial trauma or asymetry. Sinuses non tender.  Extra occullar muscles intact.. External ears normal, . Neck: supple, no adenopathy,JVD or thyromegaly.No bruits.  Chest: Clear to ascultation bilaterally.No crackles or wheezes. Non tender to palpation  Breast: No asymetry,no masses or lumps. No tenderness. No nipple discharge or inversion. No axillary or supraclavicular adenopathy  Cardiovascular system; Heart sounds normal,  S1 and  S2 ,no S3.  No murmur, or thrill. Apical beat not displaced Peripheral pulses normal.  Abdomen: Soft, non tender, no organomegaly or masses. No bruits. Bowel sounds normal. No guarding, tenderness or rebound.   GU: External genitalia normal female genitalia , normal female distribution of hair. No lesions. Urethral meatus normal in size, no  Prolapse, no lesions visibly  Present. Bladder non tender. Vagina pink and moist , with no visible lesions , discharge present . Adequate pelvic support no  cystocele or rectocele noted  Uterus absent no adnexal masses, no cervical motion or adnexal tenderness.   Musculoskeletal exam: Full ROM of spine, hips , shoulders and knees. No deformity ,swelling or crepitus noted. No muscle wasting or atrophy.   Neurologic: Cranial nerves 2 to 12 intact. Power, tone ,sensation and reflexes normal throughout. No disturbance in gait. No tremor.  Skin: Intact, no ulceration, erythema , scaling or rash noted. Pigmentation normal throughout Hyperpigmented mole on right anterior n  neck  Psych; Normal mood and affect. Judgement and concentration normal   Assessment & Plan:  Annual physical exam Annual exam as documented. Counseling done  re healthy lifestyle involving commitment to 150 minutes exercise per week, heart healthy diet, and attaining healthy weight.The importance of adequate sleep also discussed. Regular seat belt use and home safety, is also discussed. Changes in health habits are decided on by the patient with goals and time frames  set for achieving them. Immunization and cancer screening needs are specifically addressed at this visit.   Essential hypertension Needs to resume medication. Start amlodipine 5 mg daily. Re evaluate in 3 monhts DASH diet and commitment to daily physical activity for a minimum of 30 minutes discussed and encouraged, as a part of hypertension management. The importance of attaining a healthy weight is also discussed.  BP/Weight 11/29/2020 08/11/2019 07/01/2019 11/30/2018 08/04/2018 04/18/2018 38/01/5642  Systolic BP 329 518 841 660 630 160 109  Diastolic BP 82 79 78 82 82 74 83  Wt. (Lbs) 196.12 205 210.8 205 205.2 201 201.6  BMI 31.65 32.11 34.02 33.09 33.12 32.44 32.54       Need for shingles vaccine After obtaining informed consent, the vaccine is  administered , with no adverse effect noted at the time of administration.

## 2020-11-29 NOTE — Assessment & Plan Note (Signed)
After obtaining informed consent, the vaccine is  administered , with no adverse effect noted at the time of administration.  

## 2020-11-30 DIAGNOSIS — Z0000 Encounter for general adult medical examination without abnormal findings: Secondary | ICD-10-CM | POA: Diagnosis not present

## 2020-11-30 DIAGNOSIS — E559 Vitamin D deficiency, unspecified: Secondary | ICD-10-CM | POA: Diagnosis not present

## 2020-11-30 DIAGNOSIS — Z13220 Encounter for screening for lipoid disorders: Secondary | ICD-10-CM | POA: Diagnosis not present

## 2020-11-30 DIAGNOSIS — R7301 Impaired fasting glucose: Secondary | ICD-10-CM | POA: Diagnosis not present

## 2020-12-01 ENCOUNTER — Other Ambulatory Visit: Payer: Self-pay | Admitting: Family Medicine

## 2020-12-01 LAB — CYTOLOGY - PAP
Comment: NEGATIVE
Diagnosis: NEGATIVE
High risk HPV: NEGATIVE

## 2020-12-01 LAB — TSH: TSH: 0.595 u[IU]/mL (ref 0.450–4.500)

## 2020-12-01 LAB — HEMOGLOBIN A1C
Est. average glucose Bld gHb Est-mCnc: 128 mg/dL
Hgb A1c MFr Bld: 6.1 % — ABNORMAL HIGH (ref 4.8–5.6)

## 2020-12-01 LAB — CMP14+EGFR
ALT: 12 IU/L (ref 0–32)
AST: 12 IU/L (ref 0–40)
Albumin/Globulin Ratio: 2 (ref 1.2–2.2)
Albumin: 4.6 g/dL (ref 3.8–4.9)
Alkaline Phosphatase: 204 IU/L — ABNORMAL HIGH (ref 44–121)
BUN/Creatinine Ratio: 12 (ref 9–23)
BUN: 11 mg/dL (ref 6–24)
Bilirubin Total: 0.7 mg/dL (ref 0.0–1.2)
CO2: 19 mmol/L — ABNORMAL LOW (ref 20–29)
Calcium: 9.5 mg/dL (ref 8.7–10.2)
Chloride: 105 mmol/L (ref 96–106)
Creatinine, Ser: 0.92 mg/dL (ref 0.57–1.00)
Globulin, Total: 2.3 g/dL (ref 1.5–4.5)
Glucose: 113 mg/dL — ABNORMAL HIGH (ref 65–99)
Potassium: 4.3 mmol/L (ref 3.5–5.2)
Sodium: 143 mmol/L (ref 134–144)
Total Protein: 6.9 g/dL (ref 6.0–8.5)
eGFR: 72 mL/min/{1.73_m2} (ref >59)

## 2020-12-01 LAB — CBC
Hematocrit: 42.3 % (ref 34.0–46.6)
Hemoglobin: 14.2 g/dL (ref 11.1–15.9)
MCH: 29.4 pg (ref 26.6–33.0)
MCHC: 33.6 g/dL (ref 31.5–35.7)
MCV: 88 fL (ref 79–97)
Platelets: 288 10*3/uL (ref 150–450)
RBC: 4.83 x10E6/uL (ref 3.77–5.28)
RDW: 12.7 % (ref 11.7–15.4)
WBC: 7.6 10*3/uL (ref 3.4–10.8)

## 2020-12-01 LAB — LIPID PANEL
Chol/HDL Ratio: 3.5 ratio (ref 0.0–4.4)
Cholesterol, Total: 152 mg/dL (ref 100–199)
HDL: 43 mg/dL (ref >39)
LDL Chol Calc (NIH): 88 mg/dL (ref 0–99)
Triglycerides: 118 mg/dL (ref 0–149)
VLDL Cholesterol Cal: 21 mg/dL (ref 5–40)

## 2020-12-01 LAB — VITAMIN D 25 HYDROXY (VIT D DEFICIENCY, FRACTURES): Vit D, 25-Hydroxy: 18.3 ng/mL — ABNORMAL LOW (ref 30.0–100.0)

## 2020-12-01 MED ORDER — ERGOCALCIFEROL 1.25 MG (50000 UT) PO CAPS: 50000.0000 [IU] | ORAL_CAPSULE | ORAL | 2 refills | Status: DC

## 2021-01-18 ENCOUNTER — Ambulatory Visit (INDEPENDENT_AMBULATORY_CARE_PROVIDER_SITE_OTHER): Payer: BC Managed Care – PPO | Admitting: *Deleted

## 2021-01-18 ENCOUNTER — Other Ambulatory Visit: Payer: Self-pay

## 2021-01-18 DIAGNOSIS — Z23 Encounter for immunization: Secondary | ICD-10-CM

## 2021-03-21 ENCOUNTER — Ambulatory Visit: Payer: BC Managed Care – PPO | Admitting: Family Medicine

## 2021-03-22 ENCOUNTER — Ambulatory Visit: Payer: BC Managed Care – PPO | Admitting: Family Medicine

## 2021-04-08 IMAGING — MG DIGITAL SCREENING BILAT W/ TOMO W/ CAD
6 of 12 series · 6 of 36 positions shown · non-contrast
Comparison: Previous exam(s).

CLINICAL DATA: Screening.

EXAM:
DIGITAL SCREENING BILATERAL MAMMOGRAM WITH TOMO AND CAD

[L MLO synth-2D (1 of 2)]
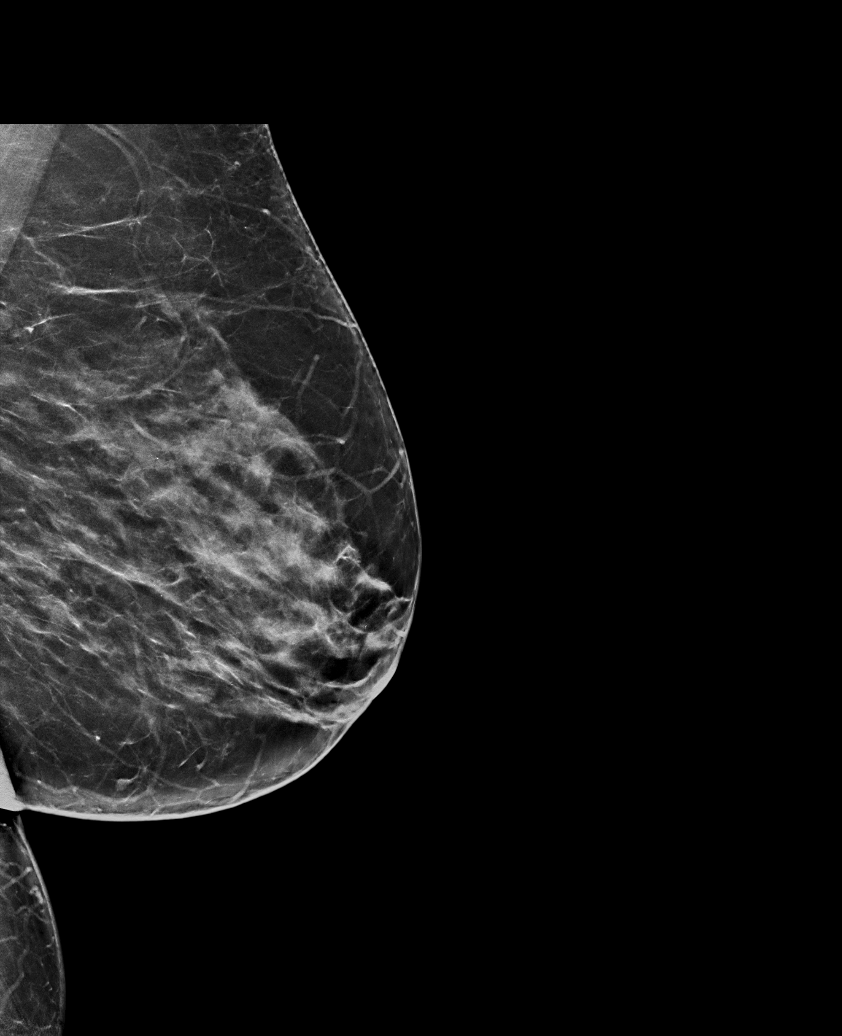

[R MLO synth-2D (1 of 2)]
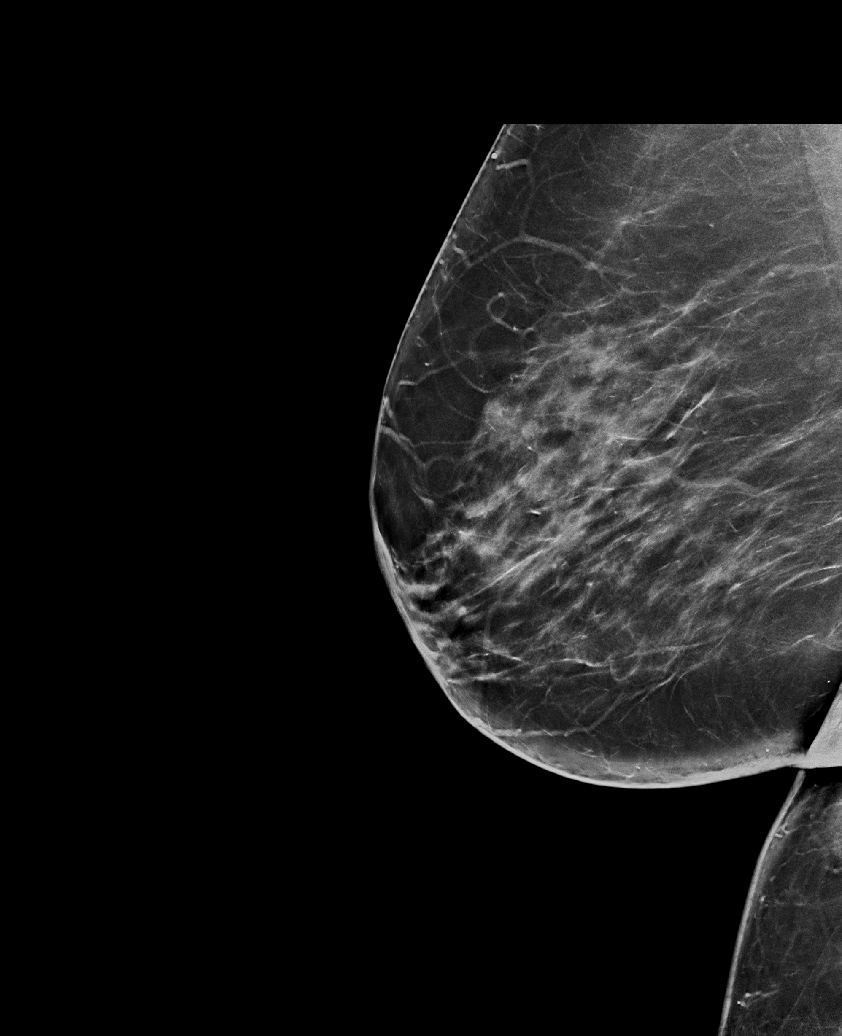

[R CC synth-2D]
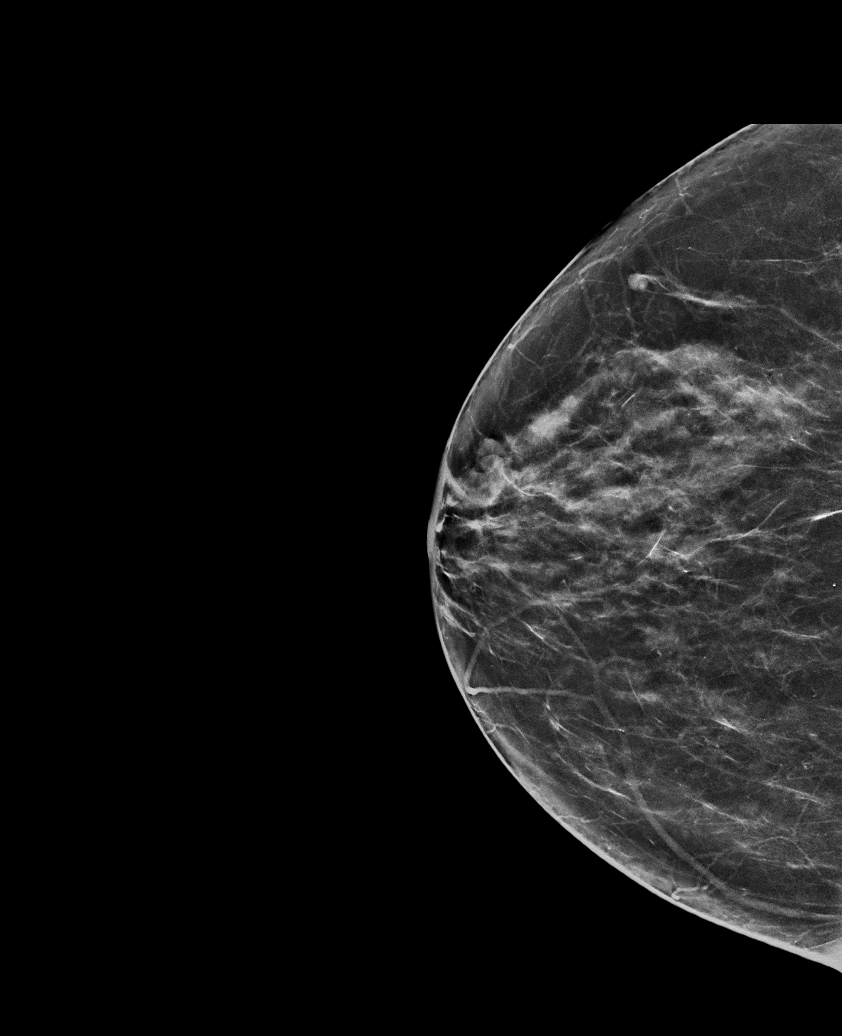

[R MLO synth-2D (2 of 2)]
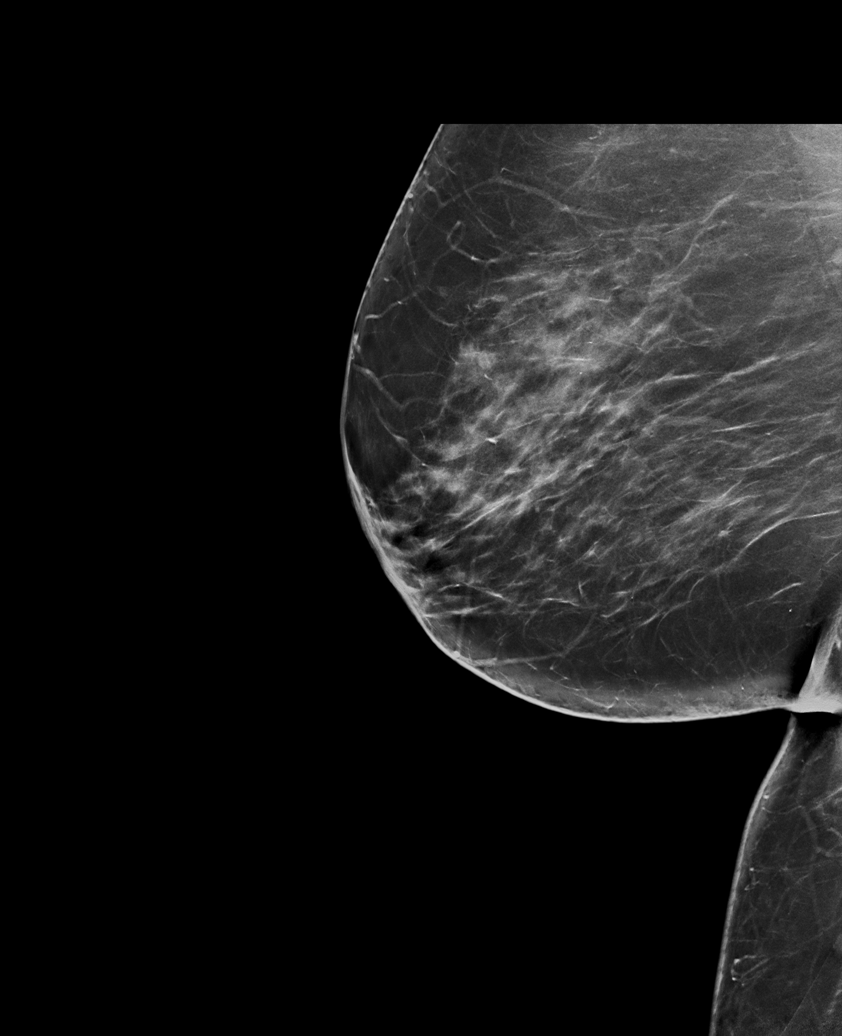

[L CC synth-2D]
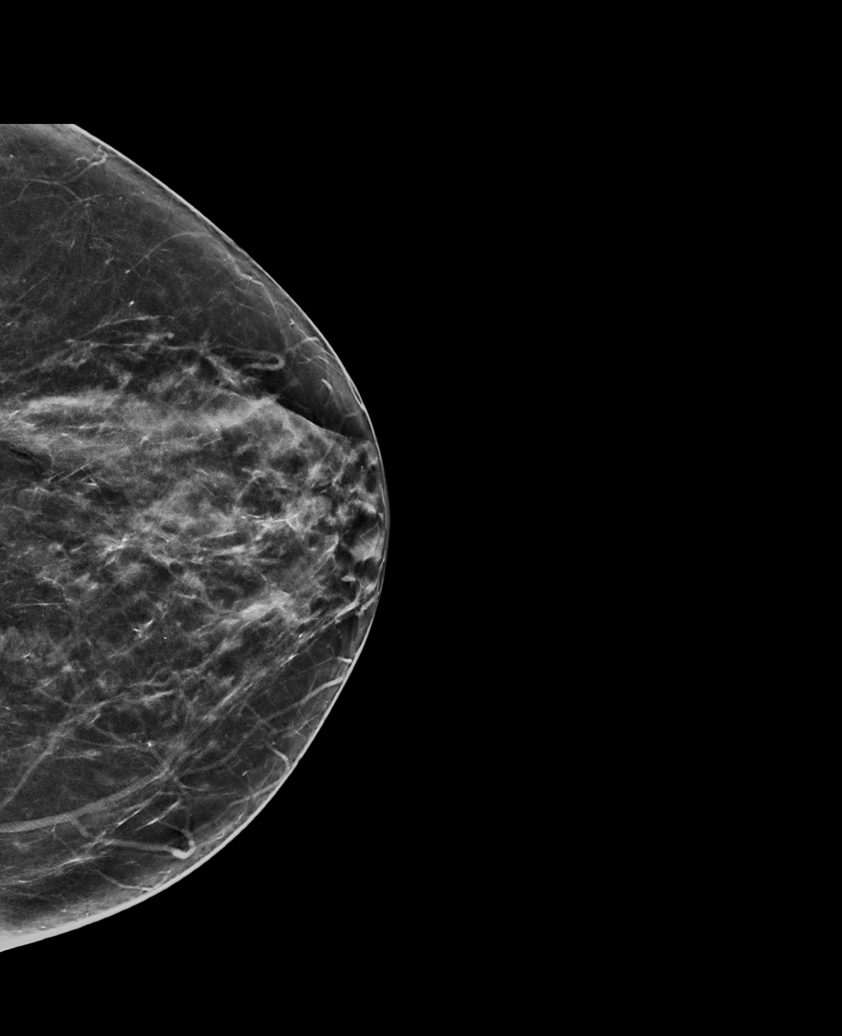

[L MLO synth-2D (2 of 2)]
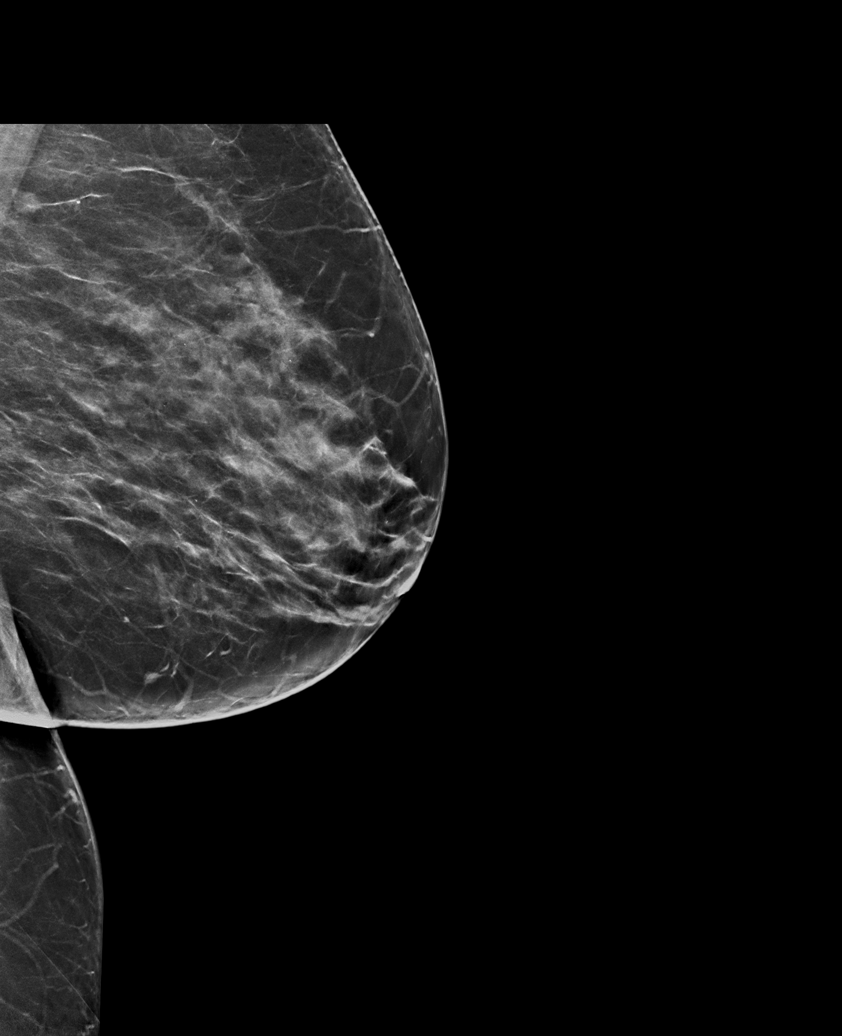

[6 of 36 positions shown; findings below may reference images not displayed]

ACR Breast Density Category c: The breast tissue is heterogeneously
dense, which may obscure small masses.
FINDINGS: There are no findings suspicious for malignancy. Images were
processed with CAD.
IMPRESSION: No mammographic evidence of malignancy. A result letter of this
screening mammogram will be mailed directly to the patient.

RECOMMENDATION:
Screening mammogram in one year. (Code:[id number])

BI-RADS CATEGORY  1: Negative.

## 2021-05-18 ENCOUNTER — Other Ambulatory Visit: Payer: Self-pay

## 2021-05-18 ENCOUNTER — Encounter: Payer: Self-pay | Admitting: Family Medicine

## 2021-05-18 ENCOUNTER — Ambulatory Visit (INDEPENDENT_AMBULATORY_CARE_PROVIDER_SITE_OTHER): Payer: BC Managed Care – PPO | Admitting: Family Medicine

## 2021-05-18 VITALS — BP 131/79 | HR 75 | Resp 16 | Ht 66.0 in | Wt 192.0 lb

## 2021-05-18 DIAGNOSIS — Z23 Encounter for immunization: Secondary | ICD-10-CM

## 2021-05-18 DIAGNOSIS — E669 Obesity, unspecified: Secondary | ICD-10-CM

## 2021-05-18 DIAGNOSIS — I10 Essential (primary) hypertension: Secondary | ICD-10-CM

## 2021-05-18 DIAGNOSIS — R7303 Prediabetes: Secondary | ICD-10-CM

## 2021-05-18 DIAGNOSIS — E559 Vitamin D deficiency, unspecified: Secondary | ICD-10-CM

## 2021-05-18 DIAGNOSIS — B351 Tinea unguium: Secondary | ICD-10-CM

## 2021-05-18 DIAGNOSIS — E66811 Obesity, class 1: Secondary | ICD-10-CM

## 2021-05-18 MED ORDER — OMEPRAZOLE 20 MG PO CPDR: 20.0000 mg | DELAYED_RELEASE_CAPSULE | Freq: Every day | ORAL | 1 refills | Status: DC

## 2021-05-18 MED ORDER — TERBINAFINE HCL 250 MG PO TABS: 250.0000 mg | ORAL_TABLET | Freq: Every day | ORAL | 0 refills | Status: DC

## 2021-05-18 MED ORDER — AMLODIPINE BESYLATE 5 MG PO TABS: 5.0000 mg | ORAL_TABLET | Freq: Every day | ORAL | 3 refills | Status: DC

## 2021-05-18 NOTE — Patient Instructions (Addendum)
Annual; exam end April, call if you need me sooner  Flu vaccine today  Please get current covid vaccine asap    Labs today, vit D, cmp and eGF and HBA1C  Terbinafine is prescribed for 6 weeks for fungal infection of right 4th toe  It is important that you exercise regularly at least 30 minutes 5 times a week. If you develop chest pain, have severe difficulty breathing, or feel very tired, stop exercising immediately and seek medical attention   Think about what you will eat, plan ahead. Choose " clean, green, fresh or frozen" over canned, processed or packaged foods which are more sugary, salty and fatty. 70 to 75% of food eaten should be vegetables and fruit. Three meals at set times with snacks allowed between meals, but they must be fruit or vegetables. Aim to eat over a 12 hour period , example 7 am to 7 pm, and STOP after  your last meal of the day. Drink water,generally about 64 ounces per day, no other drink is as healthy. Fruit juice is best enjoyed in a healthy way, by EATING the fruit.  Thanks for choosing [facility], we consider it a privelige to serve you.

## 2021-05-19 LAB — CMP14+EGFR
ALT: 10 IU/L (ref 0–32)
AST: 14 IU/L (ref 0–40)
Albumin/Globulin Ratio: 1.8 (ref 1.2–2.2)
Albumin: 4.4 g/dL (ref 3.8–4.9)
Alkaline Phosphatase: 191 IU/L — ABNORMAL HIGH (ref 44–121)
BUN/Creatinine Ratio: 15 (ref 9–23)
BUN: 15 mg/dL (ref 6–24)
Bilirubin Total: 0.2 mg/dL (ref 0.0–1.2)
CO2: 21 mmol/L (ref 20–29)
Calcium: 9.5 mg/dL (ref 8.7–10.2)
Chloride: 105 mmol/L (ref 96–106)
Creatinine, Ser: 0.97 mg/dL (ref 0.57–1.00)
Globulin, Total: 2.5 g/dL (ref 1.5–4.5)
Glucose: 159 mg/dL — ABNORMAL HIGH (ref 70–99)
Potassium: 3.9 mmol/L (ref 3.5–5.2)
Sodium: 143 mmol/L (ref 134–144)
Total Protein: 6.9 g/dL (ref 6.0–8.5)
eGFR: 67 mL/min/{1.73_m2} (ref >59)

## 2021-05-19 LAB — HEMOGLOBIN A1C
Est. average glucose Bld gHb Est-mCnc: 131 mg/dL
Hgb A1c MFr Bld: 6.2 % — ABNORMAL HIGH (ref 4.8–5.6)

## 2021-05-19 LAB — VITAMIN D 25 HYDROXY (VIT D DEFICIENCY, FRACTURES): Vit D, 25-Hydroxy: 51.4 ng/mL (ref 30.0–100.0)

## 2021-05-20 ENCOUNTER — Encounter: Payer: Self-pay | Admitting: Family Medicine

## 2021-05-20 DIAGNOSIS — B351 Tinea unguium: Secondary | ICD-10-CM | POA: Insufficient documentation

## 2021-05-20 NOTE — Progress Notes (Signed)
Latoya Decker     MRN: [id number]      DOB: 07-Jul-1961   HPI Latoya Decker is here for follow up and re-evaluation of chronic medical conditions, medication management and review of any available recent lab and radiology data.  Preventive health is updated, specifically  Cancer screening and Immunization.   Questions or concerns regarding consultations or procedures which the PT has had in the interim are  addressed. The PT denies any adverse reactions to current medications since the last visit.  C/o thickened right 4th toe   ROS Denies recent fever or chills. Denies sinus pressure, nasal congestion, ear pain or sore throat. Denies chest congestion, productive cough or wheezing. Denies chest pains, palpitations and leg swelling Denies abdominal pain, nausea, vomiting,diarrhea or constipation.   Denies dysuria, frequency, hesitancy or incontinence. Denies joint pain, swelling and limitation in mobility. Denies headaches, seizures, numbness, or tingling. Denies depression, anxiety or insomnia.  PE  BP 131/79   Pulse 75   Resp 16   Ht 5\' 6"  (1.676 m)   Wt 192 lb (87.1 kg)   SpO2 97%   BMI 30.99 kg/m   Patient alert and oriented and in no cardiopulmonary distress.  HEENT: No facial asymmetry, EOMI,     Neck supple .  Chest: Clear to auscultation bilaterally.  CVS: S1, S2 no murmurs, no S3.Regular rate.  ABD: Soft non tender.   Ext: No edema  MS: Adequate ROM spine, shoulders, hips and knees.  Skin: Intact, no ulcerations or rash noted.fungal infection right 4th toenail  Psych: Good eye contact, normal affect. Memory intact not anxious or depressed appearing.  CNS: CN 2-12 intact, power,  normal throughout.no focal deficits noted.   Assessment & Plan  Essential hypertension Controlled, no change in medication DASH diet and commitment to daily physical activity for a minimum of 30 minutes discussed and encouraged, as a part of hypertension management. The  importance of attaining a healthy weight is also discussed.  BP/Weight 05/18/2021 11/29/2020 08/11/2019 07/01/2019 11/30/2018 08/04/2018 68/0/8811  Systolic BP 031 594 585 929 244 628 638  Diastolic BP 79 82 79 78 82 82 74  Wt. (Lbs) 192 196.12 205 210.8 205 205.2 201  BMI 30.99 31.65 32.11 34.02 33.09 33.12 32.44       Obesity (BMI 30.0-34.9) Improved  Patient re-educated about  the importance of commitment to a  minimum of 150 minutes of exercise per week as able.  The importance of healthy food choices with portion control discussed, as well as eating regularly and within a 12 hour window most days. The need to choose "clean , green" food 50 to 75% of the time is discussed, as well as to make water the primary drink and set a goal of 64 ounces water daily.    Weight /BMI 05/18/2021 11/29/2020 08/11/2019  WEIGHT 192 lb 196 lb 1.9 oz 205 lb  HEIGHT 5\' 6"  5\' 6"  5\' 7"   BMI 30.99 kg/m2 31.65 kg/m2 32.11 kg/m2      Prediabetes Patient educated about the importance of limiting  Carbohydrate intake , the need to commit to daily physical activity for a minimum of 30 minutes , and to commit weight loss. The fact that changes in all these areas will reduce or eliminate all together the development of diabetes is stressed.   Diabetic Labs Latest Ref Rng & Units 05/18/2021 11/30/2020 07/01/2019 04/18/2018 04/16/2018  HbA1c 4.8 - 5.6 % 6.2(H) 6.1(H) 6.4(H) - -  Chol 100 - 199 mg/dL -  152 144 - -  HDL >39 mg/dL - 43 43(L) - -  Calc LDL 0 - 99 mg/dL - 88 82 - -  Triglycerides 0 - 149 mg/dL - 118 96 - -  Creatinine 0.57 - 1.00 mg/dL 0.97 0.92 0.92 1.06(H) 0.91   BP/Weight 05/18/2021 11/29/2020 08/11/2019 07/01/2019 11/30/2018 08/04/2018 24/10/100  Systolic BP 725 366 440 347 425 956 387  Diastolic BP 79 82 79 78 82 82 74  Wt. (Lbs) 192 196.12 205 210.8 205 205.2 201  BMI 30.99 31.65 32.11 34.02 33.09 33.12 32.44   No flowsheet data found.    Onychomycosis Terbinafine prescribed for 6 weeks

## 2021-05-20 NOTE — Assessment & Plan Note (Signed)
Controlled, no change in medication DASH diet and commitment to daily physical activity for a minimum of 30 minutes discussed and encouraged, as a part of hypertension management. The importance of attaining a healthy weight is also discussed.  BP/Weight 05/18/2021 11/29/2020 08/11/2019 07/01/2019 11/30/2018 08/04/2018 21/07/1733  Systolic BP 670 141 030 131 438 887 579  Diastolic BP 79 82 79 78 82 82 74  Wt. (Lbs) 192 196.12 205 210.8 205 205.2 201  BMI 30.99 31.65 32.11 34.02 33.09 33.12 32.44

## 2021-05-20 NOTE — Assessment & Plan Note (Signed)
Patient educated about the importance of limiting  Carbohydrate intake , the need to commit to daily physical activity for a minimum of 30 minutes , and to commit weight loss. The fact that changes in all these areas will reduce or eliminate all together the development of diabetes is stressed.   Diabetic Labs Latest Ref Rng & Units 05/18/2021 11/30/2020 07/01/2019 04/18/2018 04/16/2018  HbA1c 4.8 - 5.6 % 6.2(H) 6.1(H) 6.4(H) - -  Chol 100 - 199 mg/dL - 152 144 - -  HDL >39 mg/dL - 43 43(L) - -  Calc LDL 0 - 99 mg/dL - 88 82 - -  Triglycerides 0 - 149 mg/dL - 118 96 - -  Creatinine 0.57 - 1.00 mg/dL 0.97 0.92 0.92 1.06(H) 0.91   BP/Weight 05/18/2021 11/29/2020 08/11/2019 07/01/2019 11/30/2018 08/04/2018 58/09/4619  Systolic BP 947 125 271 292 909 030 149  Diastolic BP 79 82 79 78 82 82 74  Wt. (Lbs) 192 196.12 205 210.8 205 205.2 201  BMI 30.99 31.65 32.11 34.02 33.09 33.12 32.44   No flowsheet data found.

## 2021-05-20 NOTE — Assessment & Plan Note (Signed)
Terbinafine prescribed for 6 weeks

## 2021-05-20 NOTE — Assessment & Plan Note (Signed)
Improved  Patient re-educated about  the importance of commitment to a  minimum of 150 minutes of exercise per week as able.  The importance of healthy food choices with portion control discussed, as well as eating regularly and within a 12 hour window most days. The need to choose "clean , green" food 50 to 75% of the time is discussed, as well as to make water the primary drink and set a goal of 64 ounces water daily.    Weight /BMI 05/18/2021 11/29/2020 08/11/2019  WEIGHT 192 lb 196 lb 1.9 oz 205 lb  HEIGHT 5\' 6"  5\' 6"  5\' 7"   BMI 30.99 kg/m2 31.65 kg/m2 32.11 kg/m2

## 2021-05-21 ENCOUNTER — Telehealth: Payer: Self-pay | Admitting: Family Medicine

## 2021-05-21 NOTE — Telephone Encounter (Signed)
TELE APPT SCHEDULED W/ DR. Moshe Cipro 11/8 @ 10:40

## 2021-05-21 NOTE — Telephone Encounter (Signed)
Pt will need a virtual appt to discuss with provider

## 2021-05-21 NOTE — Telephone Encounter (Signed)
Pt called in about flu symptoms. Pt came in for flu shot on 11/4 and had grandchildren over the weekend and they have flu. Pt is experiencing flu symptoms aches , sore throat .  Wants to discuss possible antibiotic

## 2021-05-22 ENCOUNTER — Ambulatory Visit: Payer: BC Managed Care – PPO

## 2021-05-22 ENCOUNTER — Ambulatory Visit (INDEPENDENT_AMBULATORY_CARE_PROVIDER_SITE_OTHER): Payer: BC Managed Care – PPO | Admitting: Family Medicine

## 2021-05-22 ENCOUNTER — Encounter: Payer: Self-pay | Admitting: Family Medicine

## 2021-05-22 ENCOUNTER — Other Ambulatory Visit: Payer: Self-pay

## 2021-05-22 DIAGNOSIS — R52 Pain, unspecified: Secondary | ICD-10-CM | POA: Diagnosis not present

## 2021-05-22 DIAGNOSIS — Z20828 Contact with and (suspected) exposure to other viral communicable diseases: Secondary | ICD-10-CM

## 2021-05-22 DIAGNOSIS — R059 Cough, unspecified: Secondary | ICD-10-CM | POA: Insufficient documentation

## 2021-05-22 LAB — POCT INFLUENZA A/B
Influenza A, POC: NEGATIVE
Influenza B, POC: NEGATIVE

## 2021-05-22 NOTE — Progress Notes (Deleted)
Swabbed for covid and flu

## 2021-05-22 NOTE — Assessment & Plan Note (Signed)
Body aches and new respiratory symptoms with recent flu exposure, test for active infection, work excuse to return in am

## 2021-05-22 NOTE — Progress Notes (Signed)
Virtual Visit via Telephone Note  I connected with Florenda A Burgueno on 05/22/21 at 10:40 AM EST by telephone and verified that I am speaking with the correct person using two identifiers.  Location: Patient: home Provider: office   I discussed the limitations, risks, security and privacy concerns of performing an evaluation and management service by telephone and the availability of in person appointments. I also discussed with the patient that there may be a patient responsible charge related to this service. The patient expressed understanding and agreed to proceed.   History of Present Illness: 3 day h/o chills and body aches, positive flu exposure x 4 days. No documented fever, and recently received her flu vaccine, has not worked today and yeterday Symptoms are improving   Observations/Objective: There were no vitals taken for this visit. Good communication with no confusion and intact memory. Alert and oriented x 3 No signs of respiratory distress during speech   Assessment and Plan:  Exposure to influenza Body aches and new respiratory symptoms with recent flu exposure, test for active infection, work excuse to return in am  Generalized body aches tylenol as needed, fluids , rest  Cough in adult Symptomatic reatment only, symptoms do not suggest bacterial infection, bronchitis or pneumonia  Follow Up Instructions:    I discussed the assessment and treatment plan with the patient. The patient was provided an opportunity to ask questions and all were answered. The patient agreed with the plan and demonstrated an understanding of the instructions.   The patient was advised to call back or seek an in-person evaluation if the symptoms worsen or if the condition fails to improve as anticipated.  I provided 12 minutes of non-face-to-face time during this encounter.   [name], MD

## 2021-05-22 NOTE — Assessment & Plan Note (Signed)
tylenol as needed, fluids , rest

## 2021-05-22 NOTE — Patient Instructions (Addendum)
F/U as before, call if you need me sooner  Please come by the office in the next 30 to 60 mins for testing for flu and covid  Continue fluids, rest, mucinex and tylenol as needed  Work note from 11/07 to return 11/09 is provided  Thanks for choosing [facility], we consider it a privelige to serve you.

## 2021-05-22 NOTE — Assessment & Plan Note (Addendum)
Symptomatic reatment only, symptoms do not suggest bacterial infection, bronchitis or pneumonia

## 2021-05-24 ENCOUNTER — Other Ambulatory Visit: Payer: Self-pay | Admitting: Family Medicine

## 2021-05-24 LAB — SARS-COV-2, NAA 2 DAY TAT

## 2021-05-24 LAB — NOVEL CORONAVIRUS, NAA: SARS-CoV-2, NAA: NOT DETECTED

## 2021-08-09 DIAGNOSIS — G43839 Menstrual migraine, intractable, without status migrainosus: Secondary | ICD-10-CM | POA: Diagnosis not present

## 2021-08-09 DIAGNOSIS — G43019 Migraine without aura, intractable, without status migrainosus: Secondary | ICD-10-CM | POA: Diagnosis not present

## 2021-08-14 DIAGNOSIS — G43019 Migraine without aura, intractable, without status migrainosus: Secondary | ICD-10-CM | POA: Diagnosis not present

## 2021-08-14 DIAGNOSIS — M542 Cervicalgia: Secondary | ICD-10-CM | POA: Diagnosis not present

## 2021-08-14 DIAGNOSIS — M791 Myalgia, unspecified site: Secondary | ICD-10-CM | POA: Diagnosis not present

## 2021-08-14 DIAGNOSIS — G518 Other disorders of facial nerve: Secondary | ICD-10-CM | POA: Diagnosis not present

## 2021-08-14 DIAGNOSIS — G43839 Menstrual migraine, intractable, without status migrainosus: Secondary | ICD-10-CM | POA: Diagnosis not present

## 2021-08-22 ENCOUNTER — Other Ambulatory Visit: Payer: Self-pay | Admitting: Family Medicine

## 2021-09-03 DIAGNOSIS — M791 Myalgia, unspecified site: Secondary | ICD-10-CM | POA: Diagnosis not present

## 2021-09-03 DIAGNOSIS — G518 Other disorders of facial nerve: Secondary | ICD-10-CM | POA: Diagnosis not present

## 2021-09-03 DIAGNOSIS — M542 Cervicalgia: Secondary | ICD-10-CM | POA: Diagnosis not present

## 2021-09-03 DIAGNOSIS — G43839 Menstrual migraine, intractable, without status migrainosus: Secondary | ICD-10-CM | POA: Diagnosis not present

## 2021-09-03 DIAGNOSIS — G43019 Migraine without aura, intractable, without status migrainosus: Secondary | ICD-10-CM | POA: Diagnosis not present

## 2021-09-19 DIAGNOSIS — M791 Myalgia, unspecified site: Secondary | ICD-10-CM | POA: Diagnosis not present

## 2021-09-19 DIAGNOSIS — G518 Other disorders of facial nerve: Secondary | ICD-10-CM | POA: Diagnosis not present

## 2021-09-19 DIAGNOSIS — G43019 Migraine without aura, intractable, without status migrainosus: Secondary | ICD-10-CM | POA: Diagnosis not present

## 2021-09-19 DIAGNOSIS — G43839 Menstrual migraine, intractable, without status migrainosus: Secondary | ICD-10-CM | POA: Diagnosis not present

## 2021-09-19 DIAGNOSIS — M542 Cervicalgia: Secondary | ICD-10-CM | POA: Diagnosis not present

## 2021-10-10 DIAGNOSIS — M542 Cervicalgia: Secondary | ICD-10-CM | POA: Diagnosis not present

## 2021-10-10 DIAGNOSIS — G43019 Migraine without aura, intractable, without status migrainosus: Secondary | ICD-10-CM | POA: Diagnosis not present

## 2021-10-10 DIAGNOSIS — M791 Myalgia, unspecified site: Secondary | ICD-10-CM | POA: Diagnosis not present

## 2021-10-10 DIAGNOSIS — G43839 Menstrual migraine, intractable, without status migrainosus: Secondary | ICD-10-CM | POA: Diagnosis not present

## 2021-10-10 DIAGNOSIS — G518 Other disorders of facial nerve: Secondary | ICD-10-CM | POA: Diagnosis not present

## 2021-10-30 DIAGNOSIS — M791 Myalgia, unspecified site: Secondary | ICD-10-CM | POA: Diagnosis not present

## 2021-10-30 DIAGNOSIS — M542 Cervicalgia: Secondary | ICD-10-CM | POA: Diagnosis not present

## 2021-10-30 DIAGNOSIS — G518 Other disorders of facial nerve: Secondary | ICD-10-CM | POA: Diagnosis not present

## 2021-10-30 DIAGNOSIS — G43019 Migraine without aura, intractable, without status migrainosus: Secondary | ICD-10-CM | POA: Diagnosis not present

## 2021-11-09 ENCOUNTER — Encounter: Payer: BC Managed Care – PPO | Admitting: Family Medicine

## 2021-11-20 ENCOUNTER — Other Ambulatory Visit: Payer: Self-pay | Admitting: Family Medicine

## 2021-11-21 ENCOUNTER — Encounter: Payer: BC Managed Care – PPO | Admitting: Family Medicine

## 2022-01-17 ENCOUNTER — Telehealth: Payer: Self-pay

## 2022-01-17 ENCOUNTER — Other Ambulatory Visit: Payer: Self-pay

## 2022-01-17 MED ORDER — OMEPRAZOLE 20 MG PO CPDR: DELAYED_RELEASE_CAPSULE | ORAL | 1 refills | Status: DC

## 2022-01-17 NOTE — Telephone Encounter (Signed)
Refills sent

## 2022-01-17 NOTE — Telephone Encounter (Signed)
Pt called in requesting a refill of omeprazole (PRILOSEC) 20 MG capsule [[id number]] sent to [facility] on [address]. Please advise.  Cb#: [phone]

## 2022-01-18 ENCOUNTER — Other Ambulatory Visit: Payer: Self-pay

## 2022-02-25 DIAGNOSIS — G518 Other disorders of facial nerve: Secondary | ICD-10-CM | POA: Diagnosis not present

## 2022-02-25 DIAGNOSIS — M542 Cervicalgia: Secondary | ICD-10-CM | POA: Diagnosis not present

## 2022-02-25 DIAGNOSIS — G43019 Migraine without aura, intractable, without status migrainosus: Secondary | ICD-10-CM | POA: Diagnosis not present

## 2022-02-25 DIAGNOSIS — M791 Myalgia, unspecified site: Secondary | ICD-10-CM | POA: Diagnosis not present

## 2022-04-03 ENCOUNTER — Encounter: Payer: Self-pay | Admitting: *Deleted

## 2022-04-10 ENCOUNTER — Encounter: Payer: Self-pay | Admitting: Family Medicine

## 2022-04-19 ENCOUNTER — Encounter: Payer: Self-pay | Admitting: *Deleted

## 2022-04-19 NOTE — Patient Instructions (Signed)
  Procedure: colonoscopy  Estimated body mass index is 30.67 kg/m as calculated from the following:   Height as of this encounter: '5\' 6"'$  (1.676 m).   Weight as of this encounter: 190 lb (86.2 kg).   Have you had a colonoscopy before?  04/21/17  Do you have family history of colon cancer  yes  Do you have a family history of polyps? yes  Previous colonoscopy with polyps removed? yes  Do you have a history colorectal cancer?   no  Are you diabetic?  no  Do you have a prosthetic or mechanical heart valve? no  Do you have a pacemaker/defibrillator?   no  Have you had endocarditis/atrial fibrillation?  no  Do you use supplemental oxygen/CPAP?  no  Have you had joint replacement within the last 12 months?  no  Do you tend to be constipated or have to use laxatives?  no   Do you have history of alcohol use? If yes, how much and how often.  no  Do you have history or are you using drugs? If yes, what do are you  using?  no  Have you ever had a stroke/heart attack?  no  Have you ever had a heart or other vascular stent placed,?no  Do you take weight loss medication? no  female patients,: have you had a hysterectomy? no                              are you post menopausal?  yes                              do you still have your menstrual cycle? no    Date of last menstrual period.   Do you take any blood-thinning medications such as: (Plavix, aspirin, Coumadin, Aggrenox, Brilinta, Xarelto, Eliquis, Pradaxa, Savaysa or Effient) no  If yes we need the name, milligram, dosage and who is prescribing doctor:               Current Outpatient Medications  Medication Sig Dispense Refill   amLODipine (NORVASC) 5 MG tablet Take 1 tablet (5 mg total) by mouth daily. 90 tablet 1   topiramate (TOPAMAX) 25 MG tablet Take 25 mg by mouth in the morning, at noon, and at bedtime.     No current facility-administered medications for this visit.    Allergies  Allergen Reactions    Codeine Nausea And Vomiting   Hydrocodone Nausea And Vomiting   Lidocaine Viscous Hcl     Couldn't think straight

## 2022-04-30 NOTE — Progress Notes (Signed)
LMOVM to call back 

## 2022-04-30 NOTE — Progress Notes (Signed)
ASA 2, okay to schedule. 

## 2022-05-01 ENCOUNTER — Encounter: Payer: Self-pay | Admitting: *Deleted

## 2022-05-01 MED ORDER — PEG 3350-KCL-NA BICARB-NACL 420 G PO SOLR: 4000.0000 mL | Freq: Once | ORAL | 0 refills | Status: AC

## 2022-05-01 NOTE — Progress Notes (Signed)
Pt had left vm to schedule colonoscopy.  LMOVM to return call

## 2022-05-02 NOTE — Progress Notes (Signed)
Pt scheduled for 05/21/22 at 11:00 am with Dr.Carver. Instructions mailed and prep sent to the pharmacy.

## 2022-05-21 ENCOUNTER — Other Ambulatory Visit: Payer: Self-pay

## 2022-05-21 ENCOUNTER — Encounter (HOSPITAL_COMMUNITY): Payer: Self-pay

## 2022-05-21 ENCOUNTER — Ambulatory Visit (HOSPITAL_COMMUNITY): Payer: BC Managed Care – PPO | Admitting: Anesthesiology

## 2022-05-21 ENCOUNTER — Ambulatory Visit (HOSPITAL_COMMUNITY)
Admission: RE | Admit: 2022-05-21 | Discharge: 2022-05-21 | Disposition: A | Discharge status: Home / Self Care | Payer: BC Managed Care – PPO | Attending: Internal Medicine | Admitting: Internal Medicine

## 2022-05-21 ENCOUNTER — Encounter (HOSPITAL_COMMUNITY)
Admission: RE | Disposition: A | Discharge status: Home / Self Care | Payer: Self-pay | Source: Home / Self Care | Attending: Internal Medicine

## 2022-05-21 DIAGNOSIS — D122 Benign neoplasm of ascending colon: Secondary | ICD-10-CM | POA: Insufficient documentation

## 2022-05-21 DIAGNOSIS — Z86010 Personal history of colonic polyps: Secondary | ICD-10-CM | POA: Diagnosis not present

## 2022-05-21 DIAGNOSIS — D123 Benign neoplasm of transverse colon: Secondary | ICD-10-CM | POA: Insufficient documentation

## 2022-05-21 DIAGNOSIS — Z1211 Encounter for screening for malignant neoplasm of colon: Secondary | ICD-10-CM | POA: Diagnosis not present

## 2022-05-21 DIAGNOSIS — I10 Essential (primary) hypertension: Secondary | ICD-10-CM | POA: Insufficient documentation

## 2022-05-21 DIAGNOSIS — K219 Gastro-esophageal reflux disease without esophagitis: Secondary | ICD-10-CM | POA: Diagnosis not present

## 2022-05-21 DIAGNOSIS — Z09 Encounter for follow-up examination after completed treatment for conditions other than malignant neoplasm: Secondary | ICD-10-CM

## 2022-05-21 DIAGNOSIS — K5730 Diverticulosis of large intestine without perforation or abscess without bleeding: Secondary | ICD-10-CM | POA: Diagnosis not present

## 2022-05-21 DIAGNOSIS — F418 Other specified anxiety disorders: Secondary | ICD-10-CM | POA: Diagnosis not present

## 2022-05-21 DIAGNOSIS — Z800 Family history of malignant neoplasm of digestive organs: Secondary | ICD-10-CM | POA: Insufficient documentation

## 2022-05-21 DIAGNOSIS — D126 Benign neoplasm of colon, unspecified: Secondary | ICD-10-CM

## 2022-05-21 HISTORY — PX: COLONOSCOPY WITH PROPOFOL: SHX5780

## 2022-05-21 HISTORY — PX: POLYPECTOMY: SHX149

## 2022-05-21 SURGERY — COLONOSCOPY WITH PROPOFOL
Anesthesia: General

## 2022-05-21 MED ORDER — LIDOCAINE HCL (CARDIAC) PF 100 MG/5ML IV SOSY
PREFILLED_SYRINGE | INTRAVENOUS | Status: DC | PRN
  Administered 2022-05-21: 60 mg via INTRATRACHEAL

## 2022-05-21 MED ORDER — LACTATED RINGERS IV SOLN: INTRAVENOUS | Status: DC

## 2022-05-21 MED ORDER — PROPOFOL 10 MG/ML IV BOLUS
INTRAVENOUS | Status: DC | PRN
  Administered 2022-05-21: 100 mg via INTRAVENOUS
  Administered 2022-05-21: 50 mg via INTRAVENOUS
  Administered 2022-05-21: 30 mg via INTRAVENOUS
  Administered 2022-05-21: 50 mg via INTRAVENOUS
  Administered 2022-05-21 (×2): 30 mg via INTRAVENOUS

## 2022-05-21 NOTE — Discharge Instructions (Addendum)
  Colonoscopy Discharge Instructions  Read the instructions outlined below and refer to this sheet in the next few weeks. These discharge instructions provide you with general information on caring for yourself after you leave the hospital. Your doctor may also give you specific instructions. While your treatment has been planned according to the most current medical practices available, unavoidable complications occasionally occur.   ACTIVITY You may resume your regular activity, but move at a slower pace for the next 24 hours.  Take frequent rest periods for the next 24 hours.  Walking will help get rid of the air and reduce the bloated feeling in your belly (abdomen).  No driving for 24 hours (because of the medicine (anesthesia) used during the test).   Do not sign any important legal documents or operate any machinery for 24 hours (because of the anesthesia used during the test).  NUTRITION Drink plenty of fluids.  You may resume your normal diet as instructed by your doctor.  Begin with a light meal and progress to your normal diet. Heavy or fried foods are harder to digest and may make you feel sick to your stomach (nauseated).  Avoid alcoholic beverages for 24 hours or as instructed.  MEDICATIONS You may resume your normal medications unless your doctor tells you otherwise.  WHAT YOU CAN EXPECT TODAY Some feelings of bloating in the abdomen.  Passage of more gas than usual.  Spotting of blood in your stool or on the toilet paper.  IF YOU HAD POLYPS REMOVED DURING THE COLONOSCOPY: No aspirin products for 7 days or as instructed.  No alcohol for 7 days or as instructed.  Eat a soft diet for the next 24 hours.  FINDING OUT THE RESULTS OF YOUR TEST Not all test results are available during your visit. If your test results are not back during the visit, make an appointment with your caregiver to find out the results. Do not assume everything is normal if you have not heard from your  caregiver or the medical facility. It is important for you to follow up on all of your test results.  SEEK IMMEDIATE MEDICAL ATTENTION IF: You have more than a spotting of blood in your stool.  Your belly is swollen (abdominal distention).  You are nauseated or vomiting.  You have a temperature over 101.  You have abdominal pain or discomfort that is severe or gets worse throughout the day.   Your colonoscopy revealed 3 polyp(s) which I removed successfully. Await pathology results, my office will contact you. I recommend repeating colonoscopy in 5 years for surveillance purposes.   You also have diverticulosis and internal hemorrhoids. I would recommend increasing fiber in your diet or adding OTC Benefiber/Metamucil. Be sure to drink at least 4 to 6 glasses of water daily. Follow-up with GI as needed.   I hope you have a great rest of your week!  Elon Alas. Abbey Chatters, D.O. Gastroenterology and Hepatology [facility]

## 2022-05-21 NOTE — H&P (Signed)
Primary Care Physician:  Fayrene Helper, MD Primary Gastroenterologist:  Dr. Abbey Chatters  Pre-Procedure History & Physical: HPI:  Latoya Decker is a 60 y.o. female is here for a colonoscopy to be performed for surveillance purposes, personal history of adenomatous colon polyps 2018.  Past Medical History:  Diagnosis Date   Anxiety    Depression    Diverticulosis    Duodenal ulcer 01/05/2010   EGD Dr Dierdre Searles, h pylori gastritis, completed treatment   Duodenitis    Eosinophilic gastroenteritis JAN 2015   [place] 2014: NL CMP/GIARDIA Ag   Family hx of colon cancer    Mother & sister   Gastritis    hospitalized 6/23-6/25 2011    Headache(784.0)    migraines   Helicobacter pylori gastritis    Hypertension    Nondisplaced fracture of distal phalanx of right thumb with routine healing 10/17/17 11/13/2017   PUD (peptic ulcer disease)    Dr Tamala Julian   S/P colonoscopy 12/27/2003   Dr Voncille Lo   S/P endoscopy 12/27/2003   Dr [facility] superficial ulcers GEJ, duodenitis,gastritis    Past Surgical History:  Procedure Laterality Date   ABDOMINAL HYSTERECTOMY     bilateral tubal ligation  1989   BIOPSY  06/10/2011   SLF: mild gastritis, bx benign, sb bx benign    CHOLECYSTECTOMY  1997   aph-Smith   COLONOSCOPY  05/2011   SLF: multiple hyperplastic sessile polyps, scattered diverticulosis. next TCS 05/2016 ([facility])   COLONOSCOPY N/A 04/21/2017   Dr. Oneida Alar: two 2-88m colon polyps removed, diverticulosis, hemorrhoids. next TCS in 5 years.    ESOPHAGOGASTRODUODENOSCOPY N/A 14/13/2440  EOS INOPHILIC GASTROENTERITIS   ESOPHAGOGASTRODUODENOSCOPY N/A 04/21/2017   Dr. FOneida Alar mild gastritis with H.pylori present, esophageal biopsies negative.    PARTIAL HYSTERECTOMY  2007   1 ovary removed   POLYPECTOMY  06/10/2011   Procedure: POLYPECTOMY;  Surgeon: SDorothyann Peng MD;  Location: [facility];  Service: Endoscopy;;  Cecal polypectomy; Sigmoid colon polypectomies   TUBAL LIGATION      Prior to  Admission medications   Medication Sig Start Date End Date Taking? Authorizing Provider  amLODipine (NORVASC) 5 MG tablet Take 1 tablet (5 mg total) by mouth daily. 11/29/20  Yes SFayrene Helper MD  tiZANidine (ZANAFLEX) 4 MG tablet Take 4 mg by mouth 2 (two) times daily as needed (migraines). 02/25/22  Yes [provider]  topiramate (TOPAMAX) 25 MG tablet Take 25 mg by mouth 3 (three) times daily as needed (migraines).   Yes [provider]    Allergies as of 05/01/2022 - Review Complete 05/22/2021  Allergen Reaction Noted   Codeine Nausea And Vomiting 06/30/2008   Hydrocodone Nausea And Vomiting 01/12/2010   Lidocaine viscous hcl  08/25/2013    Family History  Problem Relation Age of Onset   Colon cancer Mother 547  Diabetes Mother    Hypertension Mother    Stroke Mother    Heart attack Father    Diabetes Father    Hypertension Father    Colon cancer Sister 447  Lupus Brother    Parkinsonism Brother    Anesthesia problems Neg Hx    Hypotension Neg Hx    Malignant hyperthermia Neg Hx    Pseudochol deficiency Neg Hx     Social History   Socioeconomic History   Marital status: Married    Spouse name: GDominica Severin  Number of children: 3   Years of education: College   Highest education level: Not  on file  Occupational History   Occupation: Dialysis Tech    Employer: [employer]    Employer: [employer]  Tobacco Use   Smoking status: Never   Smokeless tobacco: Never   Tobacco comments:    Never smoker  Vaping Use   Vaping Use: Never used  Substance and Sexual Activity   Alcohol use: No   Drug use: No   Sexual activity: Yes    Partners: Male    Birth control/protection: Surgical  Other Topics Concern   Not on file  Social History Narrative   Patient is married [name]) and lives at home with her husband and her granddaughter.   Patient has three adult children.   Patient works full-time.   Patient has a college education.   Patient is  right-handed.   Patient drinks one cup of tea daily.   Social Determinants of Health   Financial Resource Strain: Not on file  Food Insecurity: Not on file  Transportation Needs: Not on file  Physical Activity: Not on file  Stress: Not on file  Social Connections: Not on file  Intimate Partner Violence: Not on file    Review of Systems: See HPI, otherwise negative ROS  Physical Exam: Vital signs in last 24 hours: Temp:  [98 F (36.7 C)] 98 F (36.7 C) (11/07 0936) Pulse Rate:  [78] 78 (11/07 0936) Resp:  [19] 19 (11/07 0936) BP: (139)/(77) 139/77 (11/07 0936) SpO2:  [98 %] 98 % (11/07 0936) Weight:  [86.2 kg] 86.2 kg (11/07 0936)   General:   Alert,  Well-developed, well-nourished, pleasant and cooperative in NAD Head:  Normocephalic and atraumatic. Eyes:  Sclera clear, no icterus.   Conjunctiva pink. Ears:  Normal auditory acuity. Nose:  No deformity, discharge,  or lesions. Mouth:  No deformity or lesions, dentition normal. Neck:  Supple; no masses or thyromegaly. Lungs:  Clear throughout to auscultation.   No wheezes, crackles, or rhonchi. No acute distress. Heart:  Regular rate and rhythm; no murmurs, clicks, rubs,  or gallops. Abdomen:  Soft, nontender and nondistended. No masses, hepatosplenomegaly or hernias noted. Normal bowel sounds, without guarding, and without rebound.   Msk:  Symmetrical without gross deformities. Normal posture. Extremities:  Without clubbing or edema. Neurologic:  Alert and  oriented x4;  grossly normal neurologically. Skin:  Intact without significant lesions or rashes. Cervical Nodes:  No significant cervical adenopathy. Psych:  Alert and cooperative. Normal mood and affect.  Impression/Plan: Latoya Decker is here for a colonoscopy to be performed for surveillance purposes, personal history of adenomatous colon polyps 2018.  The risks of the procedure including infection, bleed, or perforation as well as benefits, limitations,  alternatives and imponderables have been reviewed with the patient. Questions have been answered. All parties agreeable.

## 2022-05-21 NOTE — Transfer of Care (Signed)
Immediate Anesthesia Transfer of Care Note  Patient: Latoya Decker  Procedure(s) Performed: COLONOSCOPY WITH PROPOFOL POLYPECTOMY INTESTINAL  Patient Location: Endoscopy Unit  Anesthesia Type:General  Level of Consciousness: awake  Airway & Oxygen Therapy: Patient Spontanous Breathing  Post-op Assessment: Report given to RN and Post -op Vital signs reviewed and stable  Post vital signs: Reviewed and stable  Last Vitals:  Vitals Value Taken Time  BP 90/47 05/21/22 1040  Temp 36.7 C 05/21/22 1040  Pulse 78 05/21/22 1040  Resp 16   SpO2 93 % 05/21/22 1040    Last Pain:  Vitals:   05/21/22 1040  TempSrc: Oral  PainSc:       Patients Stated Pain Goal: 9 (70/48/88 9169)  Complications: No notable events documented.

## 2022-05-21 NOTE — Anesthesia Preprocedure Evaluation (Signed)
Anesthesia Evaluation  Patient identified by MRN, date of birth, ID band Patient awake    Reviewed: Allergy & Precautions, H&P , NPO status , Patient's Chart, lab work & pertinent test results  Airway Mallampati: II  TM Distance: >3 FB Neck ROM: Full    Dental  (+) Dental Advisory Given, Missing   Pulmonary neg pulmonary ROS   Pulmonary exam normal breath sounds clear to auscultation       Cardiovascular Exercise Tolerance: Good hypertension, Pt. on medications Normal cardiovascular exam Rhythm:Regular Rate:Normal     Neuro/Psych  Headaches PSYCHIATRIC DISORDERS Anxiety Depression       GI/Hepatic Neg liver ROS, PUD,GERD  Controlled,,  Endo/Other  negative endocrine ROS    Renal/GU negative Renal ROS  negative genitourinary   Musculoskeletal negative musculoskeletal ROS (+)    Abdominal   Peds negative pediatric ROS (+)  Hematology negative hematology ROS (+)   Anesthesia Other Findings   Reproductive/Obstetrics negative OB ROS                             Anesthesia Physical Anesthesia Plan  ASA: 2  Anesthesia Plan: General   Post-op Pain Management:    Induction:   PONV Risk Score and Plan:   Airway Management Planned: Nasal Cannula and Natural Airway  Additional Equipment:   Intra-op Plan:   Post-operative Plan:   Informed Consent: I have reviewed the patients History and Physical, chart, labs and discussed the procedure including the risks, benefits and alternatives for the proposed anesthesia with the patient or authorized representative who has indicated his/her understanding and acceptance.       Plan Discussed with: CRNA and Surgeon  Anesthesia Plan Comments:        Anesthesia Quick Evaluation

## 2022-05-21 NOTE — Op Note (Signed)
[facility] Patient Name: Latoya Decker Procedure Date: 05/21/2022 10:15 AM MRN: [id number] Date of Birth: 25-Dec-1960 Attending MD: Elon Alas. Edgar Frisk, [id number] CSN: [id number] Age: 61 Admit Type: Outpatient Procedure:                Colonoscopy Indications:              Colon cancer screening in patient at increased                            risk: Colorectal cancer in mother, Surveillance:                            Personal history of adenomatous polyps on last                            colonoscopy 5 years ago Providers:                Elon Alas. Abbey Chatters, DO, [place] Page, Everardo Pacific Referring MD:              Medicines:                See the Anesthesia note for documentation of the                            administered medications Complications:            No immediate complications. Estimated Blood Loss:     Estimated blood loss was minimal. Procedure:                Pre-Anesthesia Assessment:                           - The anesthesia plan was to use monitored                            anesthesia care (MAC).                           After obtaining informed consent, the colonoscope                            was passed under direct vision. Throughout the                            procedure, the patient's blood pressure, pulse, and                            oxygen saturations were monitored continuously. The                            [id number]-HQ190L ([id number]) scope was introduced through                            the anus and advanced to the the cecum, identified  by appendiceal orifice and ileocecal valve. The                            colonoscopy was performed without difficulty. The                            patient tolerated the procedure well. The quality                            of the bowel preparation was evaluated using the                            BBPS [facility] Bowel Preparation Scale)  with scores                            of: Right Colon = 3 (entire mucosa seen well with                            no residual staining, small fragments of stool or                            opaque liquid), Transverse Colon = 3 (entire mucosa                            seen well with no residual staining, small                            fragments of stool or opaque liquid) and Left Colon                            = 2 (minor amount of residual staining, small                            fragments of stool and/or opaque liquid, but mucosa                            seen well). The total BBPS score equals 8. The                            quality of the bowel preparation was good. Scope In: 10:24:40 AM Scope Out: 10:37:43 AM Scope Withdrawal Time: 0 hours 10 minutes 10 seconds  Total Procedure Duration: 0 hours 13 minutes 3 seconds  Findings:      The perianal and digital rectal examinations were normal.      Many large-mouthed and small-mouthed diverticula were found in the       sigmoid colon, descending colon and transverse colon.      Three sessile polyps were found in the transverse colon and ascending       colon. The polyps were 4 to 7 mm in size. These polyps were removed with       a cold snare. Resection and retrieval were complete.      The exam was otherwise without abnormality. Impression:               -  Diverticulosis in the sigmoid colon, in the                            descending colon and in the transverse colon.                           - Three 4 to 7 mm polyps in the transverse colon                            and in the ascending colon, removed with a cold                            snare. Resected and retrieved.                           - The examination was otherwise normal. Moderate Sedation:      Per Anesthesia Care Recommendation:           - Patient has a contact number available for                            emergencies. The signs and symptoms of  potential                            delayed complications were discussed with the                            patient. Return to normal activities tomorrow.                            Written discharge instructions were provided to the                            patient.                           - Resume previous diet.                           - Continue present medications.                           - Await pathology results.                           - Repeat colonoscopy in 5 years for surveillance.                           - Return to GI clinic PRN. Procedure Code(s):        --- Professional ---                           [phone], Colonoscopy, flexible; with removal of                            tumor(s), polyp(s), or other  lesion(s) by snare                            technique Diagnosis Code(s):        --- Professional ---                           Z80.0, Family history of malignant neoplasm of                            digestive organs                           Z86.010, Personal history of colonic polyps                           D12.3, Benign neoplasm of transverse colon (hepatic                            flexure or splenic flexure)                           D12.2, Benign neoplasm of ascending colon                           K57.30, Diverticulosis of large intestine without                            perforation or abscess without bleeding CPT copyright 2022 American Medical Association. All rights reserved. The codes documented in this report are preliminary and upon coder review may  be revised to meet current compliance requirements. Elon Alas. Abbey Chatters, DO [place] Abbey Chatters, DO 05/21/2022 10:44:03 AM This report has been signed electronically. Number of Addenda: 0

## 2022-05-21 NOTE — Anesthesia Postprocedure Evaluation (Signed)
Anesthesia Post Note  Patient: Latoya Decker  Procedure(s) Performed: COLONOSCOPY WITH PROPOFOL POLYPECTOMY INTESTINAL  Patient location during evaluation: Phase II Anesthesia Type: General Level of consciousness: awake and alert and oriented Pain management: pain level controlled Vital Signs Assessment: post-procedure vital signs reviewed and stable Respiratory status: spontaneous breathing, nonlabored ventilation and respiratory function stable Cardiovascular status: blood pressure returned to baseline and stable Postop Assessment: no apparent nausea or vomiting Anesthetic complications: no  No notable events documented.   Last Vitals:  Vitals:   05/21/22 0936 05/21/22 1040  BP: 139/77 (!) 90/47  Pulse: 78 78  Resp: 19 18  Temp: 36.7 C 36.7 C  SpO2: 98% 93%    Last Pain:  Vitals:   05/21/22 1040  TempSrc: Oral  PainSc:                  Hulan Szumski C Tanja Gift

## 2022-05-22 LAB — SURGICAL PATHOLOGY

## 2022-05-27 DIAGNOSIS — G518 Other disorders of facial nerve: Secondary | ICD-10-CM | POA: Diagnosis not present

## 2022-05-27 DIAGNOSIS — G43019 Migraine without aura, intractable, without status migrainosus: Secondary | ICD-10-CM | POA: Diagnosis not present

## 2022-05-27 DIAGNOSIS — M791 Myalgia, unspecified site: Secondary | ICD-10-CM | POA: Diagnosis not present

## 2022-05-27 DIAGNOSIS — M542 Cervicalgia: Secondary | ICD-10-CM | POA: Diagnosis not present

## 2022-05-28 ENCOUNTER — Encounter (HOSPITAL_COMMUNITY): Payer: Self-pay | Admitting: Internal Medicine

## 2022-06-10 ENCOUNTER — Other Ambulatory Visit (HOSPITAL_COMMUNITY): Payer: Self-pay | Admitting: Family Medicine

## 2022-06-10 DIAGNOSIS — Z1231 Encounter for screening mammogram for malignant neoplasm of breast: Secondary | ICD-10-CM

## 2022-06-13 DIAGNOSIS — H40023 Open angle with borderline findings, high risk, bilateral: Secondary | ICD-10-CM | POA: Diagnosis not present

## 2022-06-14 ENCOUNTER — Ambulatory Visit (INDEPENDENT_AMBULATORY_CARE_PROVIDER_SITE_OTHER): Payer: BC Managed Care – PPO | Admitting: Family Medicine

## 2022-06-14 ENCOUNTER — Encounter: Payer: Self-pay | Admitting: Family Medicine

## 2022-06-14 VITALS — BP 134/78 | HR 72 | Ht 65.0 in | Wt 206.0 lb

## 2022-06-14 DIAGNOSIS — E559 Vitamin D deficiency, unspecified: Secondary | ICD-10-CM | POA: Diagnosis not present

## 2022-06-14 DIAGNOSIS — E66811 Obesity, class 1: Secondary | ICD-10-CM

## 2022-06-14 DIAGNOSIS — D229 Melanocytic nevi, unspecified: Secondary | ICD-10-CM | POA: Diagnosis not present

## 2022-06-14 DIAGNOSIS — I10 Essential (primary) hypertension: Secondary | ICD-10-CM | POA: Diagnosis not present

## 2022-06-14 DIAGNOSIS — Z91018 Allergy to other foods: Secondary | ICD-10-CM

## 2022-06-14 DIAGNOSIS — Z0001 Encounter for general adult medical examination with abnormal findings: Secondary | ICD-10-CM

## 2022-06-14 DIAGNOSIS — R7303 Prediabetes: Secondary | ICD-10-CM

## 2022-06-14 DIAGNOSIS — E669 Obesity, unspecified: Secondary | ICD-10-CM

## 2022-06-14 MED ORDER — OMEPRAZOLE 20 MG PO CPDR: DELAYED_RELEASE_CAPSULE | ORAL | 3 refills | Status: DC

## 2022-06-14 NOTE — Progress Notes (Unsigned)
    Latoya Decker     MRN: [id number]      DOB: 11-05-1960  HPI: Patient is in for annual physical exam. No other health concerns are expressed or addressed at the visit. Recent labs,  are reviewed. Immunization is reviewed , and  updated if needed.   PE: Pleasant  female, alert and oriented x 3, in no cardio-pulmonary distress. Afebrile. HEENT No facial trauma or asymetry. Sinuses non tender.  Extra occullar muscles intact.. External ears normal, . Neck: supple, no adenopathy,JVD or thyromegaly.No bruits.  Chest: Clear to ascultation bilaterally.No crackles or wheezes. Non tender to palpation  Breast: No asymetry,no masses or lumps. No tenderness. No nipple discharge or inversion. No axillary or supraclavicular adenopathy  Cardiovascular system; Heart sounds normal,  S1 and  S2 ,no S3.  No murmur, or thrill. Apical beat not displaced Peripheral pulses normal.  Abdomen: Soft, non tender, no organomegaly or masses. No bruits. Bowel sounds normal. No guarding, tenderness or rebound.   GU: External genitalia normal female genitalia , normal female distribution of hair. No lesions. Urethral meatus normal in size, no  Prolapse, no lesions visibly  Present. Bladder non tender. Vagina pink and moist , with no visible lesions , discharge present . Adequate pelvic support no  cystocele or rectocele noted Cervix pink and appears healthy, no lesions or ulcerations noted, no discharge noted from os Uterus normal size, no adnexal masses, no cervical motion or adnexal tenderness.   Musculoskeletal exam: Full ROM of spine, hips , shoulders and knees. No deformity ,swelling or crepitus noted. No muscle wasting or atrophy.   Neurologic: Cranial nerves 2 to 12 intact. Power, tone ,sensation and reflexes normal throughout. No disturbance in gait. No tremor.  Skin: Intact, no ulceration, erythema , scaling or rash noted. Pigmentation normal throughout  Psych; Normal  mood and affect. Judgement and concentration normal   Assessment & Plan:  No problem-specific Assessment & Plan notes found for this encounter.

## 2022-06-14 NOTE — Patient Instructions (Signed)
F/U in 6 months, call if you need me sooner  Labs today, CBC, lipid, cmp and EGFR, tSH, HBA1c and vit D  Form will be returned after loabs are drawn ansd reviewed , one day next week  Please get covid vaccine and RSV vaccine  You are referred to Allergist and to  Plastic surgery  It is important that you exercise regularly at least 30 minutes 5 times a week. If you develop chest pain, have severe difficulty breathing, or feel very tired, stop exercising immediately and seek medical attention   Think about what you will eat, plan ahead. Choose " clean, green, fresh or frozen" over canned, processed or packaged foods which are more sugary, salty and fatty. 70 to 75% of food eaten should be vegetables and fruit. Three meals at set times with snacks allowed between meals, but they must be fruit or vegetables. Aim to eat over a 12 hour period , example 7 am to 7 pm, and STOP after  your last meal of the day. Drink water,generally about 64 ounces per day, no other drink is as healthy. Fruit juice is best enjoyed in a healthy way, by EATING the fruit. \ Weight loss goal of 15 pounds in next 6 months is reasonable

## 2022-06-15 LAB — CBC WITH DIFFERENTIAL/PLATELET
Basophils Absolute: 0 10*3/uL (ref 0.0–0.2)
Basos: 1 %
EOS (ABSOLUTE): 0.2 10*3/uL (ref 0.0–0.4)
Eos: 2 %
Hematocrit: 42.1 % (ref 34.0–46.6)
Hemoglobin: 13.9 g/dL (ref 11.1–15.9)
Immature Grans (Abs): 0 10*3/uL (ref 0.0–0.1)
Immature Granulocytes: 0 %
Lymphocytes Absolute: 2.8 10*3/uL (ref 0.7–3.1)
Lymphs: 43 %
MCH: 29.5 pg (ref 26.6–33.0)
MCHC: 33 g/dL (ref 31.5–35.7)
MCV: 89 fL (ref 79–97)
Monocytes Absolute: 0.6 10*3/uL (ref 0.1–0.9)
Monocytes: 9 %
Neutrophils Absolute: 2.9 10*3/uL (ref 1.4–7.0)
Neutrophils: 45 %
Platelets: 297 10*3/uL (ref 150–450)
RBC: 4.71 x10E6/uL (ref 3.77–5.28)
RDW: 12.5 % (ref 11.7–15.4)
WBC: 6.5 10*3/uL (ref 3.4–10.8)

## 2022-06-15 LAB — CMP14+EGFR
ALT: 13 IU/L (ref 0–32)
AST: 16 IU/L (ref 0–40)
Albumin/Globulin Ratio: 2.1 (ref 1.2–2.2)
Albumin: 4.6 g/dL (ref 3.9–4.9)
Alkaline Phosphatase: 182 IU/L — ABNORMAL HIGH (ref 44–121)
BUN/Creatinine Ratio: 10 — ABNORMAL LOW (ref 12–28)
BUN: 10 mg/dL (ref 8–27)
Bilirubin Total: 0.6 mg/dL (ref 0.0–1.2)
CO2: 20 mmol/L (ref 20–29)
Calcium: 9.2 mg/dL (ref 8.7–10.3)
Chloride: 106 mmol/L (ref 96–106)
Creatinine, Ser: 0.97 mg/dL (ref 0.57–1.00)
Globulin, Total: 2.2 g/dL (ref 1.5–4.5)
Glucose: 101 mg/dL — ABNORMAL HIGH (ref 70–99)
Potassium: 4.1 mmol/L (ref 3.5–5.2)
Sodium: 141 mmol/L (ref 134–144)
Total Protein: 6.8 g/dL (ref 6.0–8.5)
eGFR: 66 mL/min/1.73 (ref >59)

## 2022-06-15 LAB — VITAMIN D 25 HYDROXY (VIT D DEFICIENCY, FRACTURES): Vit D, 25-Hydroxy: 19.1 ng/mL — ABNORMAL LOW (ref 30.0–100.0)

## 2022-06-15 LAB — LIPID PANEL
Chol/HDL Ratio: 3.6 ratio (ref 0.0–4.4)
Cholesterol, Total: 143 mg/dL (ref 100–199)
HDL: 40 mg/dL (ref >39)
LDL Chol Calc (NIH): 80 mg/dL (ref 0–99)
Triglycerides: 131 mg/dL (ref 0–149)
VLDL Cholesterol Cal: 23 mg/dL (ref 5–40)

## 2022-06-15 LAB — TSH: TSH: 0.462 u[IU]/mL (ref 0.450–4.500)

## 2022-06-15 LAB — HEMOGLOBIN A1C
Est. average glucose Bld gHb Est-mCnc: 148 mg/dL
Hgb A1c MFr Bld: 6.8 % — ABNORMAL HIGH (ref 4.8–5.6)

## 2022-06-17 ENCOUNTER — Telehealth: Payer: Self-pay | Admitting: Family Medicine

## 2022-06-17 ENCOUNTER — Encounter: Payer: Self-pay | Admitting: Family Medicine

## 2022-06-17 DIAGNOSIS — D229 Melanocytic nevi, unspecified: Secondary | ICD-10-CM | POA: Insufficient documentation

## 2022-06-17 DIAGNOSIS — Z91018 Allergy to other foods: Secondary | ICD-10-CM | POA: Insufficient documentation

## 2022-06-17 DIAGNOSIS — Z0001 Encounter for general adult medical examination with abnormal findings: Secondary | ICD-10-CM | POA: Insufficient documentation

## 2022-06-17 NOTE — Assessment & Plan Note (Signed)

## 2022-06-17 NOTE — Assessment & Plan Note (Signed)
Patient educated about the importance of limiting  Carbohydrate intake , the need to commit to daily physical activity for a minimum of 30 minutes , and to commit weight loss. The fact that changes in all these areas will reduce or eliminate all together the development of diabetes is stressed.      Latest Ref Rng & Units 06/14/2022    1:59 PM 05/18/2021    4:50 PM 11/30/2020    8:22 AM 07/01/2019   11:01 AM 04/18/2018    7:16 PM  Diabetic Labs  HbA1c 4.8 - 5.6 % 6.8  6.2  6.1  6.4    Chol 100 - 199 mg/dL 143   152  144    HDL >39 mg/dL 40   43  43    Calc LDL 0 - 99 mg/dL 80   88  82    Triglycerides 0 - 149 mg/dL 131   118  96    Creatinine 0.57 - 1.00 mg/dL 0.97  0.97  0.92  0.92  1.06       06/14/2022    1:07 PM 05/21/2022   10:40 AM 05/21/2022    9:36 AM 04/19/2022   10:00 AM 05/18/2021    4:13 PM 11/29/2020    3:48 PM 08/11/2019    7:19 AM  BP/Weight  Systolic BP 892 90 119  417 408   Diastolic BP 78 47 77  79 82   Wt. (Lbs) 206  190 190 192 196.12 205  BMI 34.28 kg/m2  30.67 kg/m2 30.67 kg/m2 30.99 kg/m2 31.65 kg/m2 32.11 kg/m2       No data to display         Now in diabetic range recommend diabetic ed, f/u in 3 months with re eval

## 2022-06-17 NOTE — Assessment & Plan Note (Signed)
Needs eval by allergist recent severe reaction to nuts/ chocolate

## 2022-06-17 NOTE — Assessment & Plan Note (Signed)
  Patient re-educated about  the importance of commitment to a  minimum of 150 minutes of exercise per week as able.  The importance of healthy food choices with portion control discussed, as well as eating regularly and within a 12 hour window most days. The need to choose "clean , green" food 50 to 75% of the time is discussed, as well as to make water the primary drink and set a goal of 64 ounces water daily.       06/14/2022    1:07 PM 05/21/2022    9:36 AM 04/19/2022   10:00 AM  Weight /BMI  Weight 206 lb 190 lb 190 lb  Height '5\' 5"'$  (1.651 m) '5\' 6"'$  (1.676 m) '5\' 6"'$  (1.676 m)  BMI 34.28 kg/m2 30.67 kg/m2 30.67 kg/m2    Deteriorated needs to change diet and commit to regular exercise, f/u in 6 months

## 2022-06-17 NOTE — Assessment & Plan Note (Signed)
Referred to plastic surgery due to location on face for removal

## 2022-06-17 NOTE — Telephone Encounter (Signed)
Patient came by office in regard to work exam forms. Patient states that forms could not be filled out until labs results came in.  Patient needs to return form to work by 12/15 Wants to know status on forms. Will come by office tomorrow by 3:30 for pick up. Patient wants a call in regard.

## 2022-06-18 NOTE — Telephone Encounter (Signed)
Forms in Dr Simpson's box to be completed.

## 2022-06-20 NOTE — Telephone Encounter (Signed)
Patient came by and picked up forms

## 2022-06-20 NOTE — Telephone Encounter (Signed)
Called patient left voicemail that forms are ready for pick up 

## 2022-06-24 ENCOUNTER — Other Ambulatory Visit: Payer: Self-pay

## 2022-06-24 ENCOUNTER — Encounter: Payer: Self-pay | Admitting: Internal Medicine

## 2022-06-24 ENCOUNTER — Ambulatory Visit (INDEPENDENT_AMBULATORY_CARE_PROVIDER_SITE_OTHER): Payer: BC Managed Care – PPO | Admitting: Internal Medicine

## 2022-06-24 VITALS — BP 138/78 | HR 79 | Temp 97.9°F | Resp 17 | Ht 66.0 in | Wt 202.8 lb

## 2022-06-24 DIAGNOSIS — J3089 Other allergic rhinitis: Secondary | ICD-10-CM | POA: Diagnosis not present

## 2022-06-24 DIAGNOSIS — J302 Other seasonal allergic rhinitis: Secondary | ICD-10-CM

## 2022-06-24 DIAGNOSIS — H1013 Acute atopic conjunctivitis, bilateral: Secondary | ICD-10-CM | POA: Diagnosis not present

## 2022-06-24 DIAGNOSIS — L500 Allergic urticaria: Secondary | ICD-10-CM | POA: Diagnosis not present

## 2022-06-24 DIAGNOSIS — L508 Other urticaria: Secondary | ICD-10-CM | POA: Diagnosis not present

## 2022-06-24 MED ORDER — OLOPATADINE HCL 0.2 % OP SOLN: 1.0000 [drp] | Freq: Every day | OPHTHALMIC | 5 refills | Status: DC | PRN

## 2022-06-24 MED ORDER — AZELASTINE HCL 0.1 % NA SOLN: 1.0000 | Freq: Two times a day (BID) | NASAL | 5 refills | Status: DC | PRN

## 2022-06-24 MED ORDER — CETIRIZINE HCL 10 MG PO TABS: 10.0000 mg | ORAL_TABLET | Freq: Every day | ORAL | 5 refills | Status: DC | PRN

## 2022-06-24 NOTE — Progress Notes (Signed)
NEW PATIENT  Date of Service/Encounter:  06/24/22  Consult requested by: Fayrene Helper, MD   Subjective:   Latoya Decker (DOB: Nov 16, 1960) is a 60 y.o. female who presents to the clinic on 06/24/2022 with a chief complaint of Allergic Reaction .    History obtained from: chart review and patient.  Rhinitis:  Started around age 35-50s. Symptoms include: nasal congestion, rhinorrhea, sneezing, watery eyes, and itchy eyes  Occurs seasonally-Spring Potential triggers: not sure Treatments tried:  Zyrtec/Claritin PRN; it does help. Last use was months ago.   Previous allergy testing: yes over 10 years ago and can't recall results  History of reflux/heartburn: yes on Omeprazole '20mg'$  PRN History of chronic sinusitis or sinus surgery: no  Concern for Food Allergy:  Foods of concern: chocolate almonds, shellfish/fish History of reaction:  A day after eating chocolate almonds, she developed hives and it lasted for about 3 days. This happened in Sept 2023 when she was at the beach.  No insect stings.  No illness. She can't recall what other things she ate that day but nothing else stood out to her except chocolate almonds. She did not eat any seafood.    In the past, she was told she has shellfish and fish allergy and has some intermittent itching with ingestion. No other symptoms.  She does sometimes still eat seafood but always takes a benadryl first and still has experienced itching.  Previous allergy testing yes it was over 10 years ago.   Carries an epinephrine autoinjector: no  Past Medical History: Past Medical History:  Diagnosis Date   Anxiety    Depression    Diverticulosis    Duodenal ulcer 01/05/2010   EGD Dr Dierdre Searles, h pylori gastritis, completed treatment   Duodenitis    Eosinophilic gastroenteritis JAN 2015   [place] 2014: NL CMP/GIARDIA Ag   Family hx of colon cancer    Mother & sister   Gastritis    hospitalized 6/23-6/25 2011    Headache(784.0)     migraines   Helicobacter pylori gastritis    Hypertension    Nondisplaced fracture of distal phalanx of right thumb with routine healing 10/17/17 11/13/2017   PUD (peptic ulcer disease)    Dr Tamala Julian   S/P colonoscopy 12/27/2003   Dr Voncille Lo   S/P endoscopy 12/27/2003   Dr [place] Service superficial ulcers GEJ, duodenitis,gastritis   Past Surgical History: Past Surgical History:  Procedure Laterality Date   ABDOMINAL HYSTERECTOMY     bilateral tubal ligation  1989   BIOPSY  06/10/2011   SLF: mild gastritis, bx benign, sb bx benign    CHOLECYSTECTOMY  1997   aph-Smith   COLONOSCOPY  05/2011   SLF: multiple hyperplastic sessile polyps, scattered diverticulosis. next TCS 05/2016 ([facility])   COLONOSCOPY N/A 04/21/2017   Dr. Oneida Alar: two 2-90m colon polyps removed, diverticulosis, hemorrhoids. next TCS in 5 years.    COLONOSCOPY WITH PROPOFOL N/A 05/21/2022   Procedure: COLONOSCOPY WITH PROPOFOL;  Surgeon: CEloise Harman DO;  [facility];  Service: Endoscopy;  Laterality: N/A;  11:00 am   ESOPHAGOGASTRODUODENOSCOPY N/A 19/50/9326  EOS INOPHILIC GASTROENTERITIS   ESOPHAGOGASTRODUODENOSCOPY N/A 04/21/2017   Dr. FOneida Alar mild gastritis with H.pylori present, esophageal biopsies negative.    PARTIAL HYSTERECTOMY  2007   1 ovary removed   POLYPECTOMY  06/10/2011   Procedure: POLYPECTOMY;  Surgeon: SDorothyann Peng MD;  Location: [facility];  Service: Endoscopy;;  Cecal polypectomy; Sigmoid colon polypectomies   POLYPECTOMY  05/21/2022   Procedure: POLYPECTOMY INTESTINAL;  Surgeon: Eloise Harman, DO;  [facility];  Service: Endoscopy;;   TUBAL LIGATION      Family History: Family History  Problem Relation Age of Onset   Colon cancer Mother 10   Diabetes Mother    Hypertension Mother    Stroke Mother    Heart attack Father    Diabetes Father    Hypertension Father    Colon cancer Sister 57   Lupus Brother    Parkinsonism Brother    Anesthesia problems Neg Hx     Hypotension Neg Hx    Malignant hyperthermia Neg Hx    Pseudochol deficiency Neg Hx     Social History:  Lives in a 53 year house Flooring in bedroom: wood Pets: dog Tobacco use/exposure: none Job: Information systems manager  Medication List:  Allergies as of 06/24/2022       Reactions   Codeine Nausea And Vomiting   Hydrocodone Nausea And Vomiting   Lidocaine Viscous Hcl    Couldn't think straight   Fish Allergy Rash   Shellfish Allergy Rash        Medication List        Accurate as of June 24, 2022 10:20 AM. If you have any questions, ask your nurse or doctor.          amLODipine 5 MG tablet Commonly known as: NORVASC Take 1 tablet (5 mg total) by mouth daily.   omeprazole 20 MG capsule Commonly known as: PRILOSEC Take one  capsule by mouth once daily, as needed, for reflux   tiZANidine 4 MG tablet Commonly known as: ZANAFLEX Take 4 mg by mouth 2 (two) times daily as needed (migraines).   topiramate 25 MG tablet Commonly known as: TOPAMAX Take 25 mg by mouth 3 (three) times daily as needed (migraines).         REVIEW OF SYSTEMS: Pertinent positives and negatives discussed in HPI.   Objective:   Physical Exam: BP 138/78 (BP Location: Left Arm, Patient Position: Sitting, Cuff Size: Normal)   Pulse 79   Temp 97.9 F (36.6 C) (Temporal)   Resp 17   Ht '5\' 6"'$  (1.676 m)   Wt 202 lb 12.8 oz (92 kg)   SpO2 97%   BMI 32.73 kg/m  Body mass index is 32.73 kg/m. GEN: alert, well developed HEENT: clear conjunctiva, TM grey and translucent, nose with + inferior turbinate hypertrophy, pink nasal mucosa, slight clear rhinorrhea, + cobblestoning HEART: regular rate and rhythm, no murmur LUNGS: clear to auscultation bilaterally, no coughing, unlabored respiration ABDOMEN: soft, non distended  SKIN: no rashes or lesions  Reviewed:  06/14/2022: seen PCP for allergic reaction to almonds or chocolate about 3 months ago.  Referred to A&I for further evaluation.  No Epipen prescribed as reaction was cutaneous only.   Skin Testing:  Skin prick testing was placed, which includes aeroallergens/foods, histamine control, and saline control.  Verbal consent was obtained prior to placing test.  Patient tolerated procedure well.  Allergy testing results were read and interpreted by myself, documented by clinical staff. Adequate positive and negative control.  Results discussed with patient/family.  Airborne Adult Perc - 06/24/22 0900     Time Antigen Placed 0900    Allergen Manufacturer Lavella Hammock    Location Back    Number of Test 59    1. Control-Buffer 50% Glycerol Negative    2. Control-Histamine 1 mg/ml 3+    3. Albumin saline Negative  4. [place] Negative    5. [place] Negative    6. Johnson Negative    7. [place] Blue Negative    8. Meadow Fescue 3+    9. Perennial Rye Negative    10. Sweet Vernal Negative    11. Timothy Negative    12. Cocklebur Negative    13. Burweed Marshelder Negative    14. Ragweed, short Negative    15. Ragweed, Giant Negative    16. Plantain,  English Negative    17. Lamb's Quarters Negative    18. Sheep Sorrell Negative    19. Rough Pigweed Negative    20. Marsh Elder, Rough Negative    21. Mugwort, Common Negative    22. Ash mix Negative    23. Birch mix Negative    24. Beech American Negative    25. Box, Elder Negative    26. Cedar, red Negative    27. Cottonwood, [place] Negative    28. Elm mix Negative    29. Hickory Negative    30. Maple mix Negative    31. Oak, [place] mix Negative    32. Pecan Pollen Negative    33. Pine mix Negative    34. Sycamore Eastern Negative    35. [place], Black Pollen Negative    36. Alternaria alternata Negative    37. Cladosporium Herbarum Negative    38. Aspergillus mix Negative    39. Penicillium mix Negative    40. Bipolaris sorokiniana (Helminthosporium) Negative    41. Drechslera spicifera (Curvularia) Negative    42. Mucor plumbeus Negative    43. Fusarium  moniliforme Negative    44. Aureobasidium pullulans (pullulara) Negative    45. Rhizopus oryzae Negative    46. Botrytis cinera Negative    47. Epicoccum nigrum Negative    48. Phoma betae Negative    49. Candida Albicans Negative    50. Trichophyton mentagrophytes Negative    51. Mite, D Farinae  5,000 AU/ml Negative    52. Mite, D Pteronyssinus  5,000 AU/ml Negative    53. Cat Hair 10,000 BAU/ml Negative    54.  Dog Epithelia Negative    55. Mixed Feathers Negative    56. Horse Epithelia Negative    57. Cockroach, German Negative    58. Mouse Negative    59. Tobacco Leaf Negative             Intradermal - 06/24/22 1000     Time Antigen Placed 1013    Allergen Manufacturer Lavella Hammock    Location Back    Number of Test 14    Control Negative    [place] 2+    Johnson 2+    Ragweed mix Negative    Weed mix 2+    Tree mix 2+    Mold 1 Negative    Mold 2 2+    Mold 3 Negative    Mold 4 Negative    Cat 3+    Dog Negative    Cockroach Negative    Mite mix Negative             Food Adult Perc - 06/24/22 0900     Time Antigen Placed 0901    Allergen Manufacturer Lavella Hammock    Location Back    Number of allergen test 16    Panel 2 Select    8. Shellfish Mix Negative    9. Fish Mix Negative    13. Almond Negative    19. Bass Negative  20. Trout Negative    21. Tuna Negative    22. Salmon Negative    23. Flounder Negative    24. Codfish Negative    25. Shrimp Negative    26. Crab Negative    27. Lobster Negative    28. Oyster Negative    29. Scallops Negative    64. Chocolate/Cacao bean Negative               Assessment:   1. Allergic urticaria due to ingested food   2. Seasonal and perennial allergic rhinitis   3. Allergic conjunctivitis of both eyes     Plan/Recommendations:  Allergic Rhinitis Allergic Conjunctivitis  - Positive skin test 06/2022: grasses, trees, weeds, mold, cat  - Avoidance measures discussed. - Use nasal saline rinses  before nose sprays such as with Neilmed Sinus Rinse.  Use distilled water.   - Use Azelastine 1-2 sprays each nostril twice daily as needed for congestion/runny nose. Aim upward and outward. - Use Zyrtec 10 mg daily as needed for sneezing, runny nose, itchy watery eyes.  - For eyes, use Olopatadine or Ketotifen 1 eye drop daily as needed for itchy, watery eyes.  Available over the counter, if not covered by insurance.  - Consider allergy shots as long term control of your symptoms by teaching your immune system to be more tolerant of your allergy triggers   Food allergy:  - please strictly avoid chocolate, almonds, fish and shellfish.  Sxs of urticaria with chocolate almonds and itching with fish/shellfish.  In terms of chocolate almond episode, I do wonder if this was an acute urticaria episode with an unknown trigger rather than food induced as it lasted for several days and started a day after ingestion.  - SPT today 06/2022 was negative to chocolate, almond, fish and shellfish. We will confirm with sIgE to the foods.  If both are negative, okay to reintroduce at home or do in office challenge as reactions were not consistent with anaphylaxis. If positive, we will recommend avoidance and prescribe an Epipen. - for SKIN only reaction, okay to take Benadryl '25mg'$  capsules every 6 hours  Return in about 8 weeks (around 08/19/2022).  Harlon Flor, MD [facility] [place]

## 2022-06-24 NOTE — Patient Instructions (Addendum)
Allergic Rhinitis Allergic Conjunctivitis  - Positive skin test 06/2022: grasses, trees, weeds, mold, cat  - Avoidance measures discussed. - Use nasal saline rinses before nose sprays such as with Neilmed Sinus Rinse.  Use distilled water.   - Use Azelastine 1-2 sprays each nostril twice daily as needed for congestion/runny nose. Aim upward and outward. - Use Zyrtec 10 mg daily as needed for sneezing, runny nose, itchy watery eyes.  - For eyes, use Olopatadine or Ketotifen 1 eye drop daily as needed for itchy, watery eyes.  Available over the counter, if not covered by insurance.  - Consider allergy shots as long term control of your symptoms by teaching your immune system to be more tolerant of your allergy triggers   Food allergy:  - please strictly avoid chocolate, almonds, fish and shellfish.  SPT today 06/2022 was negative to chocolate, almond, fish and shellfish. We will confirm with sIgE to the foods.  If both are negative, okay to reintroduce at home or do in office challenge. If positive, we will recommend avoidance and prescribe an Epipen. - for SKIN only reaction, okay to take Benadryl '25mg'$  capsules every 6 hours   ALLERGEN AVOIDANCE MEASURES   Molds - Indoor avoidance Use air conditioning to reduce indoor humidity.  Do not use a humidifier. Keep indoor humidity at 30 - 40%.  Use a dehumidifier if needed. In the bathroom use an exhaust fan or open a window after showering.  Wipe down damp surfaces after showering.  Clean bathrooms with a mold-killing solution (diluted bleach, or products like Tilex, etc) at least once a month. In the kitchen use an exhaust fan to remove steam from cooking.  Throw away spoiled foods immediately, and empty garbage daily.  Empty water pans below self-defrosting refrigerators frequently. Vent the clothes dryer to the outside. Limit indoor houseplants; mold grows in the dirt.  No houseplants in the bedroom. Remove carpet from the bedroom. Encase the  mattress and box springs with a zippered encasing.  Molds - Outdoor avoidance Avoid being outside when the grass is being mowed, or the ground is tilled. Avoid playing in leaves, pine straw, hay, etc.  Dead plant materials contain mold. Avoid going into barns or grain storage areas. Remove leaves, clippings and compost from around the home. Pollen Avoidance Pollen levels are highest during the mid-day and afternoon.  Consider this when planning outdoor activities. Avoid being outside when the grass is being mowed, or wear a mask if the pollen-allergic person must be the one to mow the grass. Keep the windows closed to keep pollen outside of the home. Use an air conditioner to filter the air. Take a shower, wash hair, and change clothing after working or playing outdoors during pollen season. Pet Dander Keep the pet out of your bedroom and restrict it to only a few rooms. Be advised that keeping the pet in only one room will not limit the allergens to that room. Don't pet, hug or kiss the pet; if you do, wash your hands with soap and water. High-efficiency particulate air (HEPA) cleaners run continuously in a bedroom or living room can reduce allergen levels over time. Regular use of a high-efficiency vacuum cleaner or a central vacuum can reduce allergen levels. Giving your pet a bath at least once a week can reduce airborne allergen.

## 2022-06-27 LAB — F020-IGE ALMOND: F020-IgE Almond: <0.1 kU/L

## 2022-06-27 LAB — ALLERGEN PROFILE, FOOD-FISH
Allergen Mackerel IgE: <0.1 kU/L
Allergen Salmon IgE: <0.1 kU/L
Allergen Trout IgE: <0.1 kU/L
Allergen Walley Pike IgE: <0.1 kU/L
Codfish IgE: <0.1 kU/L
Halibut IgE: <0.1 kU/L
Tuna: <0.1 kU/L

## 2022-06-27 LAB — ALLERGEN PROFILE, SHELLFISH
Clam IgE: <0.1 kU/L
F023-IgE Crab: <0.1 kU/L
F080-IgE Lobster: <0.1 kU/L
F290-IgE Oyster: <0.1 kU/L
Scallop IgE: <0.1 kU/L
Shrimp IgE: <0.1 kU/L

## 2022-06-27 LAB — ALLERGEN CHOCOLATE: Chocolate/Cacao IgE: <0.1 kU/L

## 2022-07-13 ENCOUNTER — Other Ambulatory Visit: Payer: Self-pay | Admitting: Family Medicine

## 2022-07-18 ENCOUNTER — Ambulatory Visit (HOSPITAL_COMMUNITY): Payer: BC Managed Care – PPO

## 2022-07-22 ENCOUNTER — Ambulatory Visit (HOSPITAL_COMMUNITY)
Admission: RE | Admit: 2022-07-22 | Discharge: 2022-07-22 | Disposition: A | Discharge status: Home / Self Care | Payer: BC Managed Care – PPO | Source: Ambulatory Visit | Attending: Family Medicine | Admitting: Family Medicine

## 2022-07-22 ENCOUNTER — Ambulatory Visit (HOSPITAL_COMMUNITY): Payer: BC Managed Care – PPO

## 2022-07-22 ENCOUNTER — Encounter (HOSPITAL_COMMUNITY): Payer: Self-pay

## 2022-07-22 DIAGNOSIS — Z1231 Encounter for screening mammogram for malignant neoplasm of breast: Secondary | ICD-10-CM

## 2022-08-05 ENCOUNTER — Ambulatory Visit (INDEPENDENT_AMBULATORY_CARE_PROVIDER_SITE_OTHER): Payer: BC Managed Care – PPO | Admitting: Plastic Surgery

## 2022-08-05 ENCOUNTER — Encounter: Payer: Self-pay | Admitting: Plastic Surgery

## 2022-08-05 VITALS — BP 159/79 | HR 97 | Ht 67.0 in | Wt 204.4 lb

## 2022-08-05 DIAGNOSIS — L819 Disorder of pigmentation, unspecified: Secondary | ICD-10-CM

## 2022-08-05 NOTE — Progress Notes (Signed)
Latoya Decker   Referring Provider Fayrene Helper, MD [address], [place],  [place] [address]   CC:  Chief Complaint  Patient presents with   Advice Only      Latoya Decker is an 62 y.o. female.  HPI: Latoya Decker is a very pleasant 62 year old female who presents today for evaluation of multiple pigmented skin lesions on the face and upper bac  These have been present for several years but have been getting slowly larger.  She requests excision.  Allergies  Allergen Reactions   Codeine Nausea And Vomiting   Hydrocodone Nausea And Vomiting   Lidocaine Viscous Hcl     Couldn't think straight    Outpatient Encounter Medications as of 08/05/2022  Medication Sig   amLODipine (NORVASC) 5 MG tablet TAKE 1 TABLET(5 MG) BY MOUTH DAILY   azelastine (ASTELIN) 0.1 % nasal spray Place 1 spray into both nostrils 2 (two) times daily as needed for rhinitis or allergies. Use in each nostril as directed   cetirizine (ZYRTEC ALLERGY) 10 MG tablet Take 1 tablet (10 mg total) by mouth daily as needed for allergies or rhinitis.   Olopatadine HCl 0.2 % SOLN Apply 1 drop to eye daily as needed (itchy watery eyes).   omeprazole (PRILOSEC) 20 MG capsule Take one  capsule by mouth once daily, as needed, for reflux   tiZANidine (ZANAFLEX) 4 MG tablet Take 4 mg by mouth 2 (two) times daily as needed (migraines).   topiramate (TOPAMAX) 25 MG tablet Take 25 mg by mouth 3 (three) times daily as needed (migraines).   No facility-administered encounter medications on file as of 08/05/2022.     Past Medical History:  Diagnosis Date   Anxiety    Depression    Diverticulosis    Duodenal ulcer 01/05/2010   EGD Dr Dierdre Searles, h pylori gastritis, completed treatment   Duodenitis    Eosinophilic gastroenteritis JAN 2015   [place] 2014: NL CMP/GIARDIA Ag   Family hx of colon cancer    Mother & sister   Gastritis    hospitalized 6/23-6/25 2011    Headache(784.0)    migraines   Helicobacter pylori  gastritis    Hypertension    Nondisplaced fracture of distal phalanx of right thumb with routine healing 10/17/17 11/13/2017   PUD (peptic ulcer disease)    Dr Tamala Julian   S/P colonoscopy 12/27/2003   Dr Voncille Lo   S/P endoscopy 12/27/2003   Dr [place] Service superficial ulcers GEJ, duodenitis,gastritis    Past Surgical History:  Procedure Laterality Date   ABDOMINAL HYSTERECTOMY     bilateral tubal ligation  1989   BIOPSY  06/10/2011   SLF: mild gastritis, bx benign, sb bx benign    CHOLECYSTECTOMY  1997   aph-Smith   COLONOSCOPY  05/2011   SLF: multiple hyperplastic sessile polyps, scattered diverticulosis. next TCS 05/2016 ([facility])   COLONOSCOPY N/A 04/21/2017   Dr. Oneida Alar: two 2-24m colon polyps removed, diverticulosis, hemorrhoids. next TCS in 5 years.    COLONOSCOPY WITH PROPOFOL N/A 05/21/2022   Procedure: COLONOSCOPY WITH PROPOFOL;  Surgeon: CEloise Harman DO;  [facility];  Service: Endoscopy;  Laterality: N/A;  11:00 am   ESOPHAGOGASTRODUODENOSCOPY N/A 19/92/4268  EOS INOPHILIC GASTROENTERITIS   ESOPHAGOGASTRODUODENOSCOPY N/A 04/21/2017   Dr. FOneida Alar mild gastritis with H.pylori present, esophageal biopsies negative.    PARTIAL HYSTERECTOMY  2007   1 ovary removed   POLYPECTOMY  06/10/2011   Procedure: POLYPECTOMY;  Surgeon: SCarlyon Prows  Salem Caster, MD;  Location: [facility];  Service: Endoscopy;;  Cecal polypectomy; Sigmoid colon polypectomies   POLYPECTOMY  05/21/2022   Procedure: POLYPECTOMY INTESTINAL;  Surgeon: Eloise Harman, DO;  [facility];  Service: Endoscopy;;   TUBAL LIGATION      Family History  Problem Relation Age of Onset   Colon cancer Mother 47   Diabetes Mother    Hypertension Mother    Stroke Mother    Heart attack Father    Diabetes Father    Hypertension Father    Colon cancer Sister 59   Lupus Brother    Parkinsonism Brother    Anesthesia problems Neg Hx    Hypotension Neg Hx    Malignant hyperthermia Neg Hx    Pseudochol  deficiency Neg Hx     Social History   Social History Narrative   Patient is married [name]) and lives at home with her husband and her granddaughter.   Patient has three adult children.   Patient works full-time.   Patient has a college education.   Patient is right-handed.   Patient drinks one cup of tea daily.     Review of Systems General: Denies fevers, chills, weight loss, no significant medical problems other than hypertension for which she takes lisinopril. CV: Denies chest pain, shortness of breath, palpitations Skin: She has a large pigmented skin lesion at the lateral border of the left eyebrow a 1 mm pigmented lesion on the left forehead and a 2 mm raised lesion on her upper back which catches on her clothes and occasionally bleeds.  Physical Exam    08/05/2022    8:33 AM 06/24/2022    8:41 AM 06/14/2022    1:07 PM  Vitals with BMI  Height '5\' 7"'$  '5\' 6"'$  '5\' 5"'$   Weight 204 lbs 6 oz 202 lbs 13 oz 206 lbs  BMI 32.01 29.51 88.41  Systolic 660 630 160  Diastolic 79 78 78  Pulse 97 79 72    General:  No acute distress,  Alert and oriented, Non-Toxic, Normal speech and affect Integument: As noted above the patient has 3 areas of pigmented lesions: 1 above the left eyebrow on the forehead this measures approximately 1 mm in size, a second much larger lesion which measures approximately 1 cm in size at the lateral portion of the left eyebrow, and a third lesion on the upper back which is approximately 2 mm in size and irregular in shape. Mammogram: Mammogram done this January was BI-RADS 1's Assessment/Plan Pigmented skin lesions: Patient has 3 lesions which are amenable to removal in the office.  This will be done under local anesthesia.  I discussed with her that the lesions will be easily removed but that the lateral eyebrow lesion because of the weight is oriented believe a small scar which may be visible.I also told her that the incisions will be closed with sutures that will  need to be removed 5 to 7 days after surgery.Additionally if there is any concerning finding on pathology she may require additional surgery.  She understood all of this and has requested that I proceed.  Camillia Herter 08/05/2022, 9:02 AM

## 2022-08-12 DIAGNOSIS — H401111 Primary open-angle glaucoma, right eye, mild stage: Secondary | ICD-10-CM | POA: Diagnosis not present

## 2022-08-16 ENCOUNTER — Telehealth: Payer: Self-pay | Admitting: Family Medicine

## 2022-08-16 NOTE — Telephone Encounter (Signed)
Prescription Request  08/16/2022  Is this a "Controlled Substance" medicine? No  LOV: 06/14/2022  What is the name of the medication or equipment? amLODipine (NORVASC) 5 MG tablet [[id number]]   Have you contacted your pharmacy to request a refill? No   Which pharmacy would you like this sent to?  [facility]   Patient notified that their request is being sent to the clinical staff for review and that they should receive a response within 2 business days.   Please advise at Mobile [phone] (mobile)

## 2022-08-19 ENCOUNTER — Ambulatory Visit: Payer: BC Managed Care – PPO | Admitting: Internal Medicine

## 2022-08-29 ENCOUNTER — Encounter: Payer: Self-pay | Admitting: Plastic Surgery

## 2022-08-29 ENCOUNTER — Ambulatory Visit (INDEPENDENT_AMBULATORY_CARE_PROVIDER_SITE_OTHER): Payer: BC Managed Care – PPO | Admitting: Plastic Surgery

## 2022-08-29 VITALS — BP 171/83 | HR 81

## 2022-08-29 DIAGNOSIS — D2239 Melanocytic nevi of other parts of face: Secondary | ICD-10-CM

## 2022-08-29 DIAGNOSIS — L821 Other seborrheic keratosis: Secondary | ICD-10-CM

## 2022-08-29 DIAGNOSIS — L819 Disorder of pigmentation, unspecified: Secondary | ICD-10-CM

## 2022-08-29 NOTE — Progress Notes (Signed)
Procedure Note  Preoperative Dx: Multiple pigmented skin lesions left forehead, left lateral eyebrow, back  Postoperative Dx: Same  Procedure: Excision of 3 pigmented skin lesions  Anesthesia: Lidocaine 1% with 1:100,000 epinephrine and 0.25% Sensorcaine   Indication for Procedure: Removal for pathological diagnosis  Description of Procedure: Risks and complications were explained to the patient including the risk of recurrence and need for additional procedures if the lesions returned as malignant..  Consent was confirmed and the patient understands the risks and benefits.  The potential complications and alternatives were explained and the patient consents.  The patient expressed understanding the option of not having the procedure and the risks of a scar.  Time out was called and all information was confirmed to be correct.    The area was prepped and drapped.  Local anesthetic was injected in the subcutaneous tissues.  After waiting for the local to take affect the forehead lesion was addressed first.  It was excised sharply in a transverse manner and removed without difficulty.  The total length of the incision measured 5 mm and was closed with 2 simple Prolene sutures.  The left lateral eyebrow was addressed next.  The lesion the lesion was excised with an elliptical incision and dissection carried out down to the subcutaneous tissues the lesion was removed and was closed in layers with 4-0 Monocryl in the deep tissues and interrupted 5-0 Prolene sutures in the skin.  The total length of the incision measured approximately 1 cm.  The back lesion was addressed last.  The lesion was excised in a vertical manner and the vision closed in layers with 4-0 Monocryl and 5-0 Prolene sutures.  The total length of the incision measured 5 mm.  After obtaining hemostasis, the surgical wound was closed as noted.  The combined surgical wounds measured 2 cm.  A dressing was applied.  The patient was given  instructions on how to care for the area and a follow up appointment.  Denice tolerated the procedure well and there were no complications. The specimen was sent to pathology.

## 2022-09-02 ENCOUNTER — Ambulatory Visit: Payer: BC Managed Care – PPO | Admitting: Internal Medicine

## 2022-09-03 NOTE — Progress Notes (Signed)
Patient is a 62 year old female s/p excision of pigmented skin lesions from left side of forehead, left lateral eyebrow, and back performed 08/29/2022 by Dr. Lovena Le who returns to clinic for postprocedural follow-up.  Reviewed procedural note and the excisions were closed with Prolene sutures.  Specimens were sent to pathology and***

## 2022-09-04 ENCOUNTER — Ambulatory Visit (INDEPENDENT_AMBULATORY_CARE_PROVIDER_SITE_OTHER): Payer: BC Managed Care – PPO | Admitting: Physician Assistant

## 2022-09-04 DIAGNOSIS — L819 Disorder of pigmentation, unspecified: Secondary | ICD-10-CM

## 2022-09-04 DIAGNOSIS — D2239 Melanocytic nevi of other parts of face: Secondary | ICD-10-CM

## 2022-09-05 ENCOUNTER — Other Ambulatory Visit: Payer: Self-pay | Admitting: Family Medicine

## 2022-09-09 ENCOUNTER — Ambulatory Visit (INDEPENDENT_AMBULATORY_CARE_PROVIDER_SITE_OTHER): Payer: BC Managed Care – PPO | Admitting: Physician Assistant

## 2022-09-09 DIAGNOSIS — L819 Disorder of pigmentation, unspecified: Secondary | ICD-10-CM

## 2022-09-09 HISTORY — PX: MOLE REMOVAL: SHX2046

## 2022-09-09 NOTE — Progress Notes (Signed)
Patient is a 62 year old female s/p excision of pigmented skin lesions from left side of forehead, left lateral eyebrow, and back performed 08/29/2022 by Dr. Lovena Le who returns to clinic for postprocedural follow-up and suture removal.  She was seen on 09/04/2022, but decision was made to leave the Prolene sutures on her upper back/neck in place given appearance of slight separation.  The sutures from her face were removed without complication or difficulty.  Reviewed pathology and the results are as follows: Diagnosis 1. Skin (M), left middle forehead MELANOCYTIC NEVUS, INTRADERMAL TYPE, MARGINS FREE 2. Skin (M), left lateral middle eyebrow MELANOCYTIC NEVUS, INTRADERMAL TYPE, PERIPHERAL MARGIN INVOLVED 3. Skin (M), middle upper back PIGMENTED SEBORRHEIC KERATOSIS, MARGINS FREE Microscopic Description 1. There is a proliferation of banal melanocytes predominantly in the dermis. There is no atypia. There are areas in this nevus, where the melanocytes are heavily pigmented. The margins are free. 2. There is a proliferation of banal melanocytes predominantly in the dermis. There is no atypia. There are areas in this nevus, where the melanocytes are heavily pigmented. The lesion extends to the peripheral margin of the specimen. 3. There is acanthosis with hyperpigmented keratinocytic proliferation without atypia. This is a pigmented seborrheic keratosis. The margins are free. There is a neutrophilic infiltrate involving the follicle. A degree of perifollicular inflammatory change is also seen. Histologically, this correlates with an acute folliculitis.  Discussed the above results with the patient.  Do not feel as though the peripheral margin involved for left lateral eyebrow lesion excision needs to be reexcised given that there is no atypia.    Residual Prolene sutures removed from the upper neck.  Only slight widening noted, no large dehiscence or deep wound noted.  Applied Vaseline followed by  Band-Aid.  The excision sites on her face have healed nicely.  She had been applying Vaseline, as directed.  Discussed Silagen scar gel twice daily on the face for the next 3 months.  As for her upper back/neck excision site, recommending Vaseline x 7 days and then she can transition to Silagen scar gel afterward.  No specific follow-up needed.  Patient is pleased.  Picture(s) obtained of the patient and placed in the chart were with the patient's or guardian's permission.

## 2022-09-17 ENCOUNTER — Ambulatory Visit: Payer: BC Managed Care – PPO | Admitting: Family Medicine

## 2022-09-19 ENCOUNTER — Encounter: Payer: Self-pay | Admitting: Family Medicine

## 2022-09-19 ENCOUNTER — Ambulatory Visit (INDEPENDENT_AMBULATORY_CARE_PROVIDER_SITE_OTHER): Payer: BC Managed Care – PPO | Admitting: Family Medicine

## 2022-09-19 VITALS — BP 142/80 | HR 86 | Ht 67.0 in | Wt 207.0 lb

## 2022-09-19 DIAGNOSIS — R7303 Prediabetes: Secondary | ICD-10-CM

## 2022-09-19 DIAGNOSIS — I10 Essential (primary) hypertension: Secondary | ICD-10-CM

## 2022-09-19 DIAGNOSIS — M79644 Pain in right finger(s): Secondary | ICD-10-CM | POA: Diagnosis not present

## 2022-09-19 DIAGNOSIS — E1169 Type 2 diabetes mellitus with other specified complication: Secondary | ICD-10-CM | POA: Diagnosis not present

## 2022-09-19 DIAGNOSIS — E669 Obesity, unspecified: Secondary | ICD-10-CM | POA: Diagnosis not present

## 2022-09-19 MED ORDER — AMLODIPINE BESYLATE 5 MG PO TABS: ORAL_TABLET | ORAL | 1 refills | Status: DC

## 2022-09-19 MED ORDER — PANTOPRAZOLE SODIUM 20 MG PO TBEC: 20.0000 mg | DELAYED_RELEASE_TABLET | Freq: Every day | ORAL | 5 refills | Status: DC

## 2022-09-19 NOTE — Patient Instructions (Signed)
F/U in 3 month call if you need me sooner   HBA1C, chem 7 and eGFr today, need to speak with Nurse re result   Urgent referral in to Dr Aline Brochure  It is important that you exercise regularly at least 30 minutes 5 times a week. If you develop chest pain, have severe difficulty breathing, or feel very tired, stop exercising immediately and seek medical attention   Think about what you will eat, plan ahead. Choose " clean, green, fresh or frozen" over canned, processed or packaged foods which are more sugary, salty and fatty. 70 to 75% of food eaten should be vegetables and fruit. Three meals at set times with snacks allowed between meals, but they must be fruit or vegetables. Aim to eat over a 12 hour period , example 7 am to 7 pm, and STOP after  your last meal of the day. Drink water,generally about 64 ounces per day, no other drink is as healthy. Fruit juice is best enjoyed in a healthy way, by EATING the fruit. .Thanks for choosing [facility], we consider it a privelige to serve you.

## 2022-09-19 NOTE — Assessment & Plan Note (Addendum)
10 week h/o progressive rigth thumb pain and swelling, uses computer on the job,had her in tears appprox 4 weeks ago urgent Ortho

## 2022-09-20 LAB — BMP8+EGFR
BUN/Creatinine Ratio: 16 (ref 12–28)
BUN: 14 mg/dL (ref 8–27)
CO2: 21 mmol/L (ref 20–29)
Calcium: 9.3 mg/dL (ref 8.7–10.3)
Chloride: 106 mmol/L (ref 96–106)
Creatinine, Ser: 0.9 mg/dL (ref 0.57–1.00)
Glucose: 104 mg/dL — ABNORMAL HIGH (ref 70–99)
Potassium: 4.2 mmol/L (ref 3.5–5.2)
Sodium: 143 mmol/L (ref 134–144)
eGFR: 73 mL/min/{1.73_m2} (ref >59)

## 2022-09-20 LAB — HEMOGLOBIN A1C
Est. average glucose Bld gHb Est-mCnc: 148 mg/dL
Hgb A1c MFr Bld: 6.8 % — ABNORMAL HIGH (ref 4.8–5.6)

## 2022-09-23 ENCOUNTER — Ambulatory Visit (INDEPENDENT_AMBULATORY_CARE_PROVIDER_SITE_OTHER): Payer: BC Managed Care – PPO

## 2022-09-23 ENCOUNTER — Ambulatory Visit (INDEPENDENT_AMBULATORY_CARE_PROVIDER_SITE_OTHER): Payer: BC Managed Care – PPO | Admitting: Orthopedic Surgery

## 2022-09-23 ENCOUNTER — Encounter: Payer: Self-pay | Admitting: Orthopedic Surgery

## 2022-09-23 VITALS — BP 138/75 | HR 79 | Ht 67.0 in | Wt 207.0 lb

## 2022-09-23 DIAGNOSIS — M1811 Unilateral primary osteoarthritis of first carpometacarpal joint, right hand: Secondary | ICD-10-CM

## 2022-09-23 DIAGNOSIS — G8929 Other chronic pain: Secondary | ICD-10-CM

## 2022-09-23 MED ORDER — MELOXICAM 7.5 MG PO TABS: 7.5000 mg | ORAL_TABLET | Freq: Every day | ORAL | 5 refills | Status: DC

## 2022-09-23 NOTE — Progress Notes (Signed)
Chief Complaint  Patient presents with   Hand Pain    Right thumb pain    This is a 61 year old female presents with 2 months complaint of pain and swelling right thumb.  She works at the [employer] and uses computer all day.  No history of trauma.  She did take some Advil tried soaking the hand and also used a wrist brace but pain did not come under control  Review of systems no complaints of numbness tingling or weakness  Past Medical History:  Diagnosis Date   Anxiety    Depression    Diverticulosis    Duodenal ulcer 01/05/2010   EGD Dr Dierdre Searles, h pylori gastritis, completed treatment   Duodenitis    Eosinophilic gastroenteritis JAN 2015   [place] 2014: NL CMP/GIARDIA Ag   Family hx of colon cancer    Mother & sister   Gastritis    hospitalized 6/23-6/25 2011    Headache(784.0)    migraines   Helicobacter pylori gastritis    Hypertension    Nondisplaced fracture of distal phalanx of right thumb with routine healing 10/17/17 11/13/2017   PUD (peptic ulcer disease)    Dr Tamala Julian   S/P colonoscopy 12/27/2003   Dr Voncille Lo   S/P endoscopy 12/27/2003   Dr [place] Service superficial ulcers GEJ, duodenitis,gastritis   Physical Exam Vitals and nursing note reviewed.  Constitutional:      Appearance: Normal appearance.  HENT:     Head: Normocephalic and atraumatic.  Eyes:     General: No scleral icterus.       Right eye: No discharge.        Left eye: No discharge.     Extraocular Movements: Extraocular movements intact.     Conjunctiva/sclera: Conjunctivae normal.     Pupils: Pupils are equal, round, and reactive to light.  Cardiovascular:     Rate and Rhythm: Normal rate.     Pulses: Normal pulses.  Skin:    General: Skin is warm and dry.     Capillary Refill: Capillary refill takes less than 2 seconds.  Neurological:     General: No focal deficit present.     Mental Status: She is alert and oriented to person, place, and time.  Psychiatric:        Mood and Affect: Mood normal.         Behavior: Behavior normal.        Thought Content: Thought content normal.        Judgment: Judgment normal.    Examination of the right thumb there is tenderness at the base of the joint is painful range of motion of the joint  There are no neurovascular deficits  X-rays show [facility] arthritis some subluxation  Recommend functional dynamic splinting of the thumb and meloxicam for 6 weeks  Meds ordered this encounter  Medications   meloxicam (MOBIC) 7.5 MG tablet    Sig: Take 1 tablet (7.5 mg total) by mouth daily.    Dispense:  30 tablet    Refill:  5

## 2022-09-23 NOTE — Patient Instructions (Signed)
Splint from occupational therapy has been ordered for you at [facility]. They should call you to schedule, [phone] is the phone number to call, if you want to call to schedule.

## 2022-09-25 ENCOUNTER — Encounter: Payer: Self-pay | Admitting: Family Medicine

## 2022-09-25 DIAGNOSIS — E1169 Type 2 diabetes mellitus with other specified complication: Secondary | ICD-10-CM | POA: Insufficient documentation

## 2022-09-25 NOTE — Progress Notes (Signed)
SELLA BUYS     MRN: [id number]      DOB: February 18, 1961   HPI Latoya Decker is here for follow up and re-evaluation of chronic medical conditions, medication management and review of any available recent lab and radiology data.  Preventive health is updated, specifically  Cancer screening and Immunization.   10 week h/o progressive right thumb pain and swelling, at times a 10, limiting ability to work Needs affordable reflux med Needs refill on BP meds  ROS Denies recent fever or chills. Denies sinus pressure, nasal congestion, ear pain or sore throat. Denies chest congestion, productive cough or wheezing. Denies chest pains, palpitations and leg swelling Deniesg,diarrhea or constipation.   Denies dysuria, frequency, hesitancy or incontinence. . Denies headaches, seizures, numbness, or tingling. Denies depression, anxiety or insomnia. Denies skin break down or rash.   PE  BP (!) 142/80 (BP Location: Right Arm, Patient Position: Sitting, Cuff Size: Large)   Pulse 86   Ht '5\' 7"'$  (1.702 m)   Wt 207 lb (93.9 kg)   SpO2 93%   BMI 32.42 kg/m   Patient alert and oriented and in no cardiopulmonary distress.  HEENT: No facial asymmetry, EOMI,     Neck supple .  Chest: Clear to auscultation bilaterally.  CVS: S1, S2 no murmurs, no S3.Regular rate.  ABD: Soft non tender.   Ext: No edema  MS: Adequate ROM spine, shoulders, hips and knees.Right thumb base and thenar eminence swollen and tender with reduced ROM  Skin: Intact, no ulcerations or rash noted.  Psych: Good eye contact, normal affect. Memory intact not anxious or depressed appearing.  CNS: CN 2-12 intact, power,  normal throughout.no focal deficits noted.   Assessment & Plan  Pain of right thumb 10 week h/o progressive rigth thumb pain and swelling, uses computer on the job,had her in tears appprox 4 weeks ago urgent Ortho  Prediabetes Patient educated about the importance of limiting  Carbohydrate intake ,  the need to commit to daily physical activity for a minimum of 30 minutes , and to commit weight loss. The fact that changes in all these areas will reduce or eliminate all together the development of diabetes is stressed.  Deteriorated , in diabetic range x 2     Latest Ref Rng & Units 09/19/2022    4:33 PM 06/14/2022    1:59 PM 05/18/2021    4:50 PM 11/30/2020    8:22 AM 07/01/2019   11:01 AM  Diabetic Labs  HbA1c 4.8 - 5.6 % 6.8  6.8  6.2  6.1  6.4   Chol 100 - 199 mg/dL  143   152  144   HDL >39 mg/dL  40   43  43   Calc LDL 0 - 99 mg/dL  80   88  82   Triglycerides 0 - 149 mg/dL  131   118  96   Creatinine 0.57 - 1.00 mg/dL 0.90  0.97  0.97  0.92  0.92       09/23/2022   11:35 AM 09/19/2022    4:01 PM 09/19/2022    3:58 PM 08/29/2022    1:22 PM 08/05/2022    8:33 AM 06/24/2022    8:41 AM 06/14/2022    1:07 PM  BP/Weight  Systolic BP 0000000 A999333 [id number] Q000111Q 0000000 Q000111Q  Diastolic BP 75 80 86 83 79 78 78  Wt. (Lbs) 207  207  204.4 202.8 206  BMI 32.42 kg/m2  32.42 kg/m2  32.01 kg/m2 32.73 kg/m2 34.28 kg/m2       No data to display          Updated lab needed at/ before next visit.   Type 2 diabetes mellitus with other specified complication [facility]) Latoya Decker is reminded of the importance of commitment to daily physical activity for 30 minutes or more, as able and the need to limit carbohydrate intake to 30 to 60 grams per meal to help with blood sugar control.   The need to take medication as prescribed, test blood sugar as directed, and to call between visits if there is a concern that blood sugar is uncontrolled is also discussed.   Latoya Decker is reminded of the importance of daily foot exam, annual eye examination, and good blood sugar, blood pressure and cholesterol control.     Latest Ref Rng & Units 09/19/2022    4:33 PM 06/14/2022    1:59 PM 05/18/2021    4:50 PM 11/30/2020    8:22 AM 07/01/2019   11:01 AM  Diabetic Labs  HbA1c 4.8 - 5.6 % 6.8  6.8  6.2  6.1  6.4   Chol  100 - 199 mg/dL  143   152  144   HDL >39 mg/dL  40   43  43   Calc LDL 0 - 99 mg/dL  80   88  82   Triglycerides 0 - 149 mg/dL  131   118  96   Creatinine 0.57 - 1.00 mg/dL 0.90  0.97  0.97  0.92  0.92       09/23/2022   11:35 AM 09/19/2022    4:01 PM 09/19/2022    3:58 PM 08/29/2022    1:22 PM 08/05/2022    8:33 AM 06/24/2022    8:41 AM 06/14/2022    1:07 PM  BP/Weight  Systolic BP 0000000 A999333 [id number] Q000111Q 0000000 Q000111Q  Diastolic BP 75 80 86 83 79 78 78  Wt. (Lbs) 207  207  204.4 202.8 206  BMI 32.42 kg/m2  32.42 kg/m2  32.01 kg/m2 32.73 kg/m2 34.28 kg/m2       No data to display            Deteriorated , needs diabetic ed , needs to change diet and commit to reg exercise, needs rept labs and 13 week f/u  Essential hypertension Elevated at visit , however out of med, same refilled DASH diet and commitment to daily physical activity for a minimum of 30 minutes discussed and encouraged, as a part of hypertension management. The importance of attaining a healthy weight is also discussed.     09/23/2022   11:35 AM 09/19/2022    4:01 PM 09/19/2022    3:58 PM 08/29/2022    1:22 PM 08/05/2022    8:33 AM 06/24/2022    8:41 AM 06/14/2022    1:07 PM  BP/Weight  Systolic BP 0000000 A999333 [id number] Q000111Q 0000000 Q000111Q  Diastolic BP 75 80 86 83 79 78 78  Wt. (Lbs) 207  207  204.4 202.8 206  BMI 32.42 kg/m2  32.42 kg/m2  32.01 kg/m2 32.73 kg/m2 34.28 kg/m2     '  Obesity (BMI 30.0-34.9)  Patient re-educated about  the importance of commitment to a  minimum of 150 minutes of exercise per week as able.  The importance of healthy food choices with portion control discussed, as well as eating regularly and within a 12 hour window most days. The need to choose "  clean , green" food 50 to 75% of the time is discussed, as well as to make water the primary drink and set a goal of 64 ounces water daily.       09/23/2022   11:35 AM 09/19/2022    3:58 PM 08/05/2022    8:33 AM  Weight /BMI  Weight 207 lb 207 lb 204  lb 6.4 oz  Height '5\' 7"'$  (1.702 m) '5\' 7"'$  (1.702 m) '5\' 7"'$  (1.702 m)  BMI 32.42 kg/m2 32.42 kg/m2 32.01 kg/m2

## 2022-09-25 NOTE — Assessment & Plan Note (Signed)
Elevated at visit , however out of med, same refilled DASH diet and commitment to daily physical activity for a minimum of 30 minutes discussed and encouraged, as a part of hypertension management. The importance of attaining a healthy weight is also discussed.     09/23/2022   11:35 AM 09/19/2022    4:01 PM 09/19/2022    3:58 PM 08/29/2022    1:22 PM 08/05/2022    8:33 AM 06/24/2022    8:41 AM 06/14/2022    1:07 PM  BP/Weight  Systolic BP 0000000 A999333 [id number] Q000111Q 0000000 Q000111Q  Diastolic BP 75 80 86 83 79 78 78  Wt. (Lbs) 207  207  204.4 202.8 206  BMI 32.42 kg/m2  32.42 kg/m2  32.01 kg/m2 32.73 kg/m2 34.28 kg/m2     '

## 2022-09-25 NOTE — Assessment & Plan Note (Signed)
  Patient re-educated about  the importance of commitment to a  minimum of 150 minutes of exercise per week as able.  The importance of healthy food choices with portion control discussed, as well as eating regularly and within a 12 hour window most days. The need to choose "clean , green" food 50 to 75% of the time is discussed, as well as to make water the primary drink and set a goal of 64 ounces water daily.       09/23/2022   11:35 AM 09/19/2022    3:58 PM 08/05/2022    8:33 AM  Weight /BMI  Weight 207 lb 207 lb 204 lb 6.4 oz  Height 5\' 7"  (1.702 m) 5\' 7"  (1.702 m) 5\' 7"  (1.702 m)  BMI 32.42 kg/m2 32.42 kg/m2 32.01 kg/m2

## 2022-09-25 NOTE — Assessment & Plan Note (Addendum)
Patient educated about the importance of limiting  Carbohydrate intake , the need to commit to daily physical activity for a minimum of 30 minutes , and to commit weight loss. The fact that changes in all these areas will reduce or eliminate all together the development of diabetes is stressed.  Deteriorated , in diabetic range x 2     Latest Ref Rng & Units 09/19/2022    4:33 PM 06/14/2022    1:59 PM 05/18/2021    4:50 PM 11/30/2020    8:22 AM 07/01/2019   11:01 AM  Diabetic Labs  HbA1c 4.8 - 5.6 % 6.8  6.8  6.2  6.1  6.4   Chol 100 - 199 mg/dL  143   152  144   HDL >39 mg/dL  40   43  43   Calc LDL 0 - 99 mg/dL  80   88  82   Triglycerides 0 - 149 mg/dL  131   118  96   Creatinine 0.57 - 1.00 mg/dL 0.90  0.97  0.97  0.92  0.92       09/23/2022   11:35 AM 09/19/2022    4:01 PM 09/19/2022    3:58 PM 08/29/2022    1:22 PM 08/05/2022    8:33 AM 06/24/2022    8:41 AM 06/14/2022    1:07 PM  BP/Weight  Systolic BP 0000000 A999333 [id number] Q000111Q 0000000 Q000111Q  Diastolic BP 75 80 86 83 79 78 78  Wt. (Lbs) 207  207  204.4 202.8 206  BMI 32.42 kg/m2  32.42 kg/m2  32.01 kg/m2 32.73 kg/m2 34.28 kg/m2       No data to display          Updated lab needed at/ before next visit.

## 2022-09-25 NOTE — Assessment & Plan Note (Addendum)
Latoya Decker is reminded of the importance of commitment to daily physical activity for 30 minutes or more, as able and the need to limit carbohydrate intake to 30 to 60 grams per meal to help with blood sugar control.   The need to take medication as prescribed, test blood sugar as directed, and to call between visits if there is a concern that blood sugar is uncontrolled is also discussed.   Latoya Decker is reminded of the importance of daily foot exam, annual eye examination, and good blood sugar, blood pressure and cholesterol control.     Latest Ref Rng & Units 09/19/2022    4:33 PM 06/14/2022    1:59 PM 05/18/2021    4:50 PM 11/30/2020    8:22 AM 07/01/2019   11:01 AM  Diabetic Labs  HbA1c 4.8 - 5.6 % 6.8  6.8  6.2  6.1  6.4   Chol 100 - 199 mg/dL  143   152  144   HDL >39 mg/dL  40   43  43   Calc LDL 0 - 99 mg/dL  80   88  82   Triglycerides 0 - 149 mg/dL  131   118  96   Creatinine 0.57 - 1.00 mg/dL 0.90  0.97  0.97  0.92  0.92       09/23/2022   11:35 AM 09/19/2022    4:01 PM 09/19/2022    3:58 PM 08/29/2022    1:22 PM 08/05/2022    8:33 AM 06/24/2022    8:41 AM 06/14/2022    1:07 PM  BP/Weight  Systolic BP 0000000 A999333 [id number] Q000111Q 0000000 Q000111Q  Diastolic BP 75 80 86 83 79 78 78  Wt. (Lbs) 207  207  204.4 202.8 206  BMI 32.42 kg/m2  32.42 kg/m2  32.01 kg/m2 32.73 kg/m2 34.28 kg/m2       No data to display            Deteriorated , needs diabetic ed , needs to change diet and commit to reg exercise, needs rept labs and 13 week f/u

## 2022-09-26 ENCOUNTER — Other Ambulatory Visit: Payer: Self-pay | Admitting: Family Medicine

## 2022-09-26 DIAGNOSIS — E1169 Type 2 diabetes mellitus with other specified complication: Secondary | ICD-10-CM

## 2022-10-03 NOTE — Patient Instructions (Signed)
Allergic rhinitis Continue allergen avoidance measures directed toward grass pollen, weed pollen, tree pollen, mold, and cat as listed below Continue cetirizine 10 mg once a day as needed for runny nose or itch Begin Flonase 2 sprays in each nostril once a day as needed for a stuffy nose.  In the right nostril, point the applicator out toward the right ear. In the left nostril, point the applicator out toward the left ear Continue azelastine 2 sprays in each nostril up to twice a day as needed for nasal symptoms Consider saline nasal rinses as needed for nasal symptoms. Use this before any medicated nasal sprays for best result Consider allergen immunotherapy if your symptoms do not improve with the treatment plan as listed above. Written information provided  Allergic conjunctivitis Some over the counter eye drops include Pataday one drop in each eye once a day as needed for red, itchy eyes OR Zaditor one drop in each eye twice a day as needed for red itchy eyes. Avoid eye drops that say red eye relief as they may contain medications that dry out your eyes.   Food allergy Continue to consume chocolate, almonds, fish, and shellfish as you are tolerating these foods without any reactions. You do not need to carry an EpiPen for food allergy at this time.   Call the clinic if this treatment plan is not working well for you.  Follow up in 1 year or sooner if needed.  Reducing Pollen Exposure The American Academy of Allergy, Asthma and Immunology suggests the following steps to reduce your exposure to pollen during allergy seasons. Do not hang sheets or clothing out to dry; pollen may collect on these items. Do not mow lawns or spend time around freshly cut grass; mowing stirs up pollen. Keep windows closed at night.  Keep car windows closed while driving. Minimize morning activities outdoors, a time when pollen counts are usually at their highest. Stay indoors as much as possible when pollen  counts or humidity is high and on windy days when pollen tends to remain in the air longer. Use air conditioning when possible.  Many air conditioners have filters that trap the pollen spores. Use a HEPA room air filter to remove pollen form the indoor air you breathe.  Control of Mold Allergen Mold and fungi can grow on a variety of surfaces provided certain temperature and moisture conditions exist.  Outdoor molds grow on plants, decaying vegetation and soil.  The major outdoor mold, Alternaria and Cladosporium, are found in very high numbers during hot and dry conditions.  Generally, a late Summer - Fall peak is seen for common outdoor fungal spores.  Rain will temporarily lower outdoor mold spore count, but counts rise rapidly when the rainy period ends.  The most important indoor molds are Aspergillus and Penicillium.  Dark, humid and poorly ventilated basements are ideal sites for mold growth.  The next most common sites of mold growth are the bathroom and the kitchen.  Outdoor [employer] Use air conditioning and keep windows closed Avoid exposure to decaying vegetation. Avoid leaf raking. Avoid grain handling. Consider wearing a face mask if working in moldy areas.  Indoor Mold Control Maintain humidity below 50%. Clean washable surfaces with 5% bleach solution. Remove sources e.g. Contaminated carpets.  Control of Dog or Cat Allergen Avoidance is the best way to manage a dog or cat allergy. If you have a dog or cat and are allergic to dog or cats, consider removing the dog or  cat from the home. If you have a dog or cat but don't want to find it a new home, or if your family wants a pet even though someone in the household is allergic, here are some strategies that may help keep symptoms at bay:  Keep the pet out of your bedroom and restrict it to only a few rooms. Be advised that keeping the dog or cat in only one room will not limit the allergens to that room. Don't pet, hug or  kiss the dog or cat; if you do, wash your hands with soap and water. High-efficiency particulate air (HEPA) cleaners run continuously in a bedroom or living room can reduce allergen levels over time. Regular use of a high-efficiency vacuum cleaner or a central vacuum can reduce allergen levels. Giving your dog or cat a bath at least once a week can reduce airborne allergen.   Get the labs that were ordered at your last visit to help [place] evaluate your food allergies

## 2022-10-03 NOTE — Progress Notes (Signed)
[place], [place] [address] Dept: [phone]  FOLLOW UP NOTE  Patient ID: Latoya Decker, female    DOB: 1960/10/06  Age: 62 y.o. MRN: [id number] Date of Office Visit: 10/04/2022  Assessment  Chief Complaint: Follow-up  HPI Latoya Decker is a 62 year old female who presents to the clinic for a follow-up visit.  She was last seen in this clinic on 06/24/2022 by Dr. Posey Pronto for evaluation of allergic rhinitis, allergic conjunctivitis, and food allergy to chocolate, almond, fish, and shellfish.    At today's visit, she reports her allergic rhinitis has been moderately well-controlled with sneezing as the main symptom.  She continues cetirizine as needed, azelastine nasal spray as needed, and is not currently using saline nasal spray.  Her last environmental allergy skin testing was on 06/24/2022 and was positive to grass pollen, weed pollen, tree pollen, mold mix 3, and cat.  We discussed allergen immunotherapy and she remains mildly interested.  Written information will be provided at today's visit.  Allergic conjunctivitis is reported as moderately well-controlled with occasional red and itchy eyes for which she uses an allergy eyedrop as needed with relief of symptoms.  Her last food allergy testing select foods was negative on 06/24/2022.  She has introduced chocolate, almonds, fish, and shellfish into her diet with no adverse reaction. She denies any episodes of hives since her last visit to this clinic. She has not avoiding any foods at this time.  Her current medications are listed in the chart.  Drug Allergies:  Allergies  Allergen Reactions   Codeine Nausea And Vomiting   Hydrocodone Nausea And Vomiting   Lidocaine Viscous Hcl     Couldn't think straight    Physical Exam: BP 134/74   Pulse 90   Temp 97.7 F (36.5 C)   Resp 16   Wt 211 lb 4 oz (95.8 kg)   SpO2 96%   BMI 33.09 kg/m    Physical Exam Vitals reviewed.  Constitutional:       Appearance: Normal appearance.  HENT:     Head: Normocephalic and atraumatic.     Right Ear: Tympanic membrane normal.     Left Ear: Tympanic membrane normal.     Nose:     Comments: Bilateral nares edematous and pale with thin clear nasal drainage noted.  Pharynx normal.  Ears normal.  Eyes normal.    Mouth/Throat:     Pharynx: Oropharynx is clear.  Eyes:     Conjunctiva/sclera: Conjunctivae normal.  Cardiovascular:     Rate and Rhythm: Normal rate and regular rhythm.     Heart sounds: Normal heart sounds. No murmur heard. Pulmonary:     Effort: Pulmonary effort is normal.     Breath sounds: Normal breath sounds.     Comments: Lungs clear to auscultation Musculoskeletal:        General: Normal range of motion.     Cervical back: Normal range of motion and neck supple.  Skin:    General: Skin is warm and dry.  Neurological:     Mental Status: She is alert and oriented to person, place, and time.  Psychiatric:        Mood and Affect: Mood normal.        Behavior: Behavior normal.        Thought Content: Thought content normal.        Judgment: Judgment normal.    Assessment and Plan: 1. Seasonal and perennial allergic rhinitis   2.  Allergic conjunctivitis of both eyes   3. Allergy with anaphylaxis due to food     Patient Instructions  Allergic rhinitis Continue allergen avoidance measures directed toward grass pollen, weed pollen, tree pollen, mold, and cat as listed below Continue cetirizine 10 mg once a day as needed for runny nose or itch Begin Flonase 2 sprays in each nostril once a day as needed for a stuffy nose.  In the right nostril, point the applicator out toward the right ear. In the left nostril, point the applicator out toward the left ear Continue azelastine 2 sprays in each nostril up to twice a day as needed for nasal symptoms Consider saline nasal rinses as needed for nasal symptoms. Use this before any medicated nasal sprays for best result Consider  allergen immunotherapy if your symptoms do not improve with the treatment plan as listed above. Written information provided  Allergic conjunctivitis Some over the counter eye drops include Pataday one drop in each eye once a day as needed for red, itchy eyes OR Zaditor one drop in each eye twice a day as needed for red itchy eyes. Avoid eye drops that say red eye relief as they may contain medications that dry out your eyes.   Food allergy Continue to consume chocolate, almonds, fish, and shellfish as you are tolerating these foods without any reactions. You do not need to carry an EpiPen for food allergy at this time.   Call the clinic if this treatment plan is not working well for you.  Follow up in 1 year or sooner if needed.   Return in about 1 year (around 10/04/2023), or if symptoms worsen or fail to improve.    Thank you for the opportunity to care for this patient.  Please do not hesitate to contact me with questions.  Gareth Morgan, FNP [facility]

## 2022-10-04 ENCOUNTER — Ambulatory Visit (INDEPENDENT_AMBULATORY_CARE_PROVIDER_SITE_OTHER): Payer: BC Managed Care – PPO | Admitting: Family Medicine

## 2022-10-04 ENCOUNTER — Encounter: Payer: Self-pay | Admitting: Family Medicine

## 2022-10-04 ENCOUNTER — Other Ambulatory Visit: Payer: Self-pay

## 2022-10-04 VITALS — BP 134/74 | HR 90 | Temp 97.7°F | Resp 16 | Wt 211.2 lb

## 2022-10-04 DIAGNOSIS — J302 Other seasonal allergic rhinitis: Secondary | ICD-10-CM | POA: Diagnosis not present

## 2022-10-04 DIAGNOSIS — H1013 Acute atopic conjunctivitis, bilateral: Secondary | ICD-10-CM | POA: Diagnosis not present

## 2022-10-04 DIAGNOSIS — J3089 Other allergic rhinitis: Secondary | ICD-10-CM

## 2022-10-04 DIAGNOSIS — T7800XA Anaphylactic reaction due to unspecified food, initial encounter: Secondary | ICD-10-CM

## 2022-10-11 ENCOUNTER — Ambulatory Visit (HOSPITAL_COMMUNITY): Payer: BC Managed Care – PPO | Attending: Orthopedic Surgery | Admitting: Occupational Therapy

## 2022-10-11 ENCOUNTER — Other Ambulatory Visit: Payer: Self-pay

## 2022-10-11 ENCOUNTER — Encounter (HOSPITAL_COMMUNITY): Payer: Self-pay | Admitting: Occupational Therapy

## 2022-10-11 DIAGNOSIS — R29898 Other symptoms and signs involving the musculoskeletal system: Secondary | ICD-10-CM | POA: Insufficient documentation

## 2022-10-11 DIAGNOSIS — M1811 Unilateral primary osteoarthritis of first carpometacarpal joint, right hand: Secondary | ICD-10-CM | POA: Diagnosis not present

## 2022-10-11 DIAGNOSIS — R600 Localized edema: Secondary | ICD-10-CM | POA: Insufficient documentation

## 2022-10-11 DIAGNOSIS — M79644 Pain in right finger(s): Secondary | ICD-10-CM | POA: Diagnosis not present

## 2022-10-11 NOTE — Therapy (Signed)
OUTPATIENT OCCUPATIONAL THERAPY SPLINT EVALUATION  Patient Name: Latoya Decker MRN: [id number] DOB:12-Sep-1960, 62 y.o., female Today's Date: 10/11/2022  PCP: Dr. [name] REFERRING PROVIDER: Dr. Arther Abbott  END OF SESSION:  OT End of Session - 10/11/22 1029     Visit Number 1    Number of Visits 2    Date for OT Re-Evaluation 11/10/22    Authorization Type BCBX    Authorization Time Period 60 visit limit    Authorization - Visit Number 1    Authorization - Number of Visits 87    OT Start Time 0947    OT Stop Time 1017    OT Time Calculation (min) 30 min    Activity Tolerance Patient tolerated treatment well    Behavior During Therapy [facility] for tasks assessed/performed             Past Medical History:  Diagnosis Date   Anxiety    Depression    Diverticulosis    Duodenal ulcer 01/05/2010   EGD Dr Dierdre Searles, h pylori gastritis, completed treatment   Duodenitis    Eosinophilic gastroenteritis JAN 2015   [place] 2014: NL CMP/GIARDIA Ag   Family hx of colon cancer    Mother & sister   Gastritis    hospitalized 6/23-6/25 2011    Headache(784.0)    migraines   Helicobacter pylori gastritis    Hypertension    Nondisplaced fracture of distal phalanx of right thumb with routine healing 10/17/17 11/13/2017   PUD (peptic ulcer disease)    Dr Tamala Julian   S/P colonoscopy 12/27/2003   Dr Voncille Lo   S/P endoscopy 12/27/2003   Dr Boston Service superficial ulcers GEJ, duodenitis,gastritis   Past Surgical History:  Procedure Laterality Date   ABDOMINAL HYSTERECTOMY     bilateral tubal ligation  1989   BIOPSY  06/10/2011   SLF: mild gastritis, bx benign, sb bx benign    CHOLECYSTECTOMY  1997   aph-Smith   COLONOSCOPY  05/2011   SLF: multiple hyperplastic sessile polyps, scattered diverticulosis. next TCS 05/2016 ([facility])   COLONOSCOPY N/A 04/21/2017   Dr. Oneida Alar: two 2-5mm colon polyps removed, diverticulosis, hemorrhoids. next TCS in 5 years.    COLONOSCOPY WITH  PROPOFOL N/A 05/21/2022   Procedure: COLONOSCOPY WITH PROPOFOL;  Surgeon: Eloise Harman, DO;  [facility];  Service: Endoscopy;  Laterality: N/A;  11:00 am   ESOPHAGOGASTRODUODENOSCOPY N/A [id number]   EOS INOPHILIC GASTROENTERITIS   ESOPHAGOGASTRODUODENOSCOPY N/A 04/21/2017   Dr. Oneida Alar: mild gastritis with H.pylori present, esophageal biopsies negative.    MOLE REMOVAL  09/09/2022   PARTIAL HYSTERECTOMY  2007   1 ovary removed   POLYPECTOMY  06/10/2011   Procedure: POLYPECTOMY;  Surgeon: Dorothyann Peng, MD;  Location: [facility];  Service: Endoscopy;;  Cecal polypectomy; Sigmoid colon polypectomies   POLYPECTOMY  05/21/2022   Procedure: POLYPECTOMY INTESTINAL;  Surgeon: Eloise Harman, DO;  Location[facility];  Service: Endoscopy;;   TUBAL LIGATION     Patient Active Problem List   Diagnosis Date Noted   Seasonal and perennial allergic rhinitis 10/04/2022   Allergic conjunctivitis of both eyes 10/04/2022   Allergy with anaphylaxis due to food 10/04/2022   Type 2 diabetes mellitus with other specified complication ([place]) A999333   Pain of right thumb 09/19/2022   Multiple food allergies 06/17/2022   Multiple pigmented nevi 06/17/2022   Onychomycosis 05/20/2021   Essential hypertension 11/29/2020   Vitamin D deficiency 123456   Helicobacter pylori gastritis 04/16/2018  Tubular adenoma of colon 01/02/2018   Enlarged skin mole 01/02/2018   GERD (gastroesophageal reflux disease) 03/14/2017   Elevated alkaline phosphatase level 10/16/2016   Migraine variant without intractability 08/31/2012   Family history of colon cancer 05/14/2011   Duodenal ulcer without hemorrhage or perforation and without obstruction 01/12/2010   Obesity (BMI 30.0-34.9) 03/30/2008    ONSET DATE: approximately 2 months ago  REFERRING DIAG: right CMC joint arthritis  THERAPY DIAG:  Pain in right finger(s)  Localized edema  Other symptoms and signs involving the  musculoskeletal system  Rationale for Evaluation and Treatment: Rehabilitation  SUBJECTIVE:   SUBJECTIVE STATEMENT: S: He said I have a bone spur.  Pt accompanied by: self  PERTINENT HISTORY: Pt is a 62 y/o female presenting with right thumb pain and edema.   PRECAUTIONS: None  WEIGHT BEARING RESTRICTIONS: No  PAIN:  Are you having pain? No  FALLS: Has patient fallen in last 6 months? No  PLOF: Independent  PATIENT GOALS: To have less pain.   NEXT MD VISIT: 11/04/22  OBJECTIVE:   HAND DOMINANCE: Right  ADLs: Overall ADLs: pt reports difficulty with ADLs due to pain, has difficulty with tying shoes, lifting, gripping. Sometimes has difficulty sleeping. Uses the computer all day at work and she does experience pain with this as well.    FUNCTIONAL OUTCOME MEASURES: Quick Dash: 47.73  UPPER EXTREMITY ROM:      Active ROM Right eval  Thumb MCP (0-60) 0-28  Thumb IP (0-80) 0-72  (Blank rows = not tested)   HAND FUNCTION: Grip strength: Right: 40 lbs; Left: 66 lbs, Lateral pinch: Right: 9 lbs, Left: 15 lbs, and 3 point pinch: Right: 11 lbs, Left: 12 lbs  EDEMA: Right thenar eminence group: 18 cm                Left thenar eminence group: 15.5 cm  COGNITION: Overall cognitive status: Within functional limits for tasks assessed  OBSERVATIONS: Pt with red blotchy skin along thenar and cmc regions, reports this is random, doesn't seem to be caused by any specific task or movement.    TODAY'S TREATMENT:                                                                                                                              DATE:  N/A-Eval only, ordering CMC Push Brace splint    PATIENT EDUCATION: Education details: red theraputty-grip and pinch strengthening Person educated: Patient Education method: Explanation, Demonstration, and Handouts Education comprehension: verbalized understanding and returned demonstration  HOME EXERCISE PROGRAM: Eval: red  theraputty for grip and pinch strengthening  GOALS: Goals reviewed with patient? Yes  SHORT TERM GOALS: Target date: 11/10/22  Pt will be provided with and educated on HEP to improve thumb mobility and strength required for use during ADL completion.   Goal status: INITIAL  2.  Pt will be provided with splint and demonstrate independence in donning and doffing.  Goal status: INITIAL  3.  Pt will be educated on wear and care for splint and accessories to promote appropriate use and effectiveness.   Goal status: INITIAL   ASSESSMENT:  CLINICAL IMPRESSION: Patient is a 62 y.o. female who was seen today for occupational therapy evaluation for right cmc arthritis. Pt reports her pain and swelling began approximately 2 months ago, xray shows bone spur formation, narrowing subluxation, and arthritis. Pt demonstrates swelling and decreased grip and pinch strength, in addition to reports of pain. Measured for functional CMC Push Brace for [facility] joint support and stability. Will order correct size and fit to pt when it arrives. Provided XS edema glove this session to wear at night and during computer work. Provided HEP for grip and pinch strengthening.   PERFORMANCE DEFICITS: in functional skills including ADLs, IADLs, edema, strength, pain, and UE functional use  IMPAIRMENTS: are limiting patient from ADLs, IADLs, rest and sleep, work, and leisure.   COMORBIDITIES: has no other co-morbidities that affects occupational performance. Patient will benefit from skilled OT to address above impairments and improve overall function.  MODIFICATION OR ASSISTANCE TO COMPLETE EVALUATION: No modification of tasks or assist necessary to complete an evaluation.  OT OCCUPATIONAL PROFILE AND HISTORY: Problem focused assessment: Including review of records relating to presenting problem.  CLINICAL DECISION MAKING: LOW - limited treatment options, no task modification necessary  REHAB POTENTIAL:  Good  EVALUATION COMPLEXITY: Low      PLAN:  OT FREQUENCY: every other week  OT DURATION: 4 weeks  PLANNED INTERVENTIONS: self care/ADL training, therapeutic exercise, therapeutic activity, manual therapy, splinting, patient/family education, and DME and/or AE instructions  RECOMMENDED OTHER SERVICES: N/A  CONSULTED AND AGREED WITH PLAN OF CARE: Patient  PLAN FOR NEXT SESSION: Provide [facility] Push Brace splint, follow up on edema glove use and HEP completion   Guadelupe Sabin, OTR/L  [phone] 10/11/2022, 10:30 AM

## 2022-10-11 NOTE — Patient Instructions (Signed)
Home Exercises Program Theraputty Exercises  Do the following exercises 1-2 times a day using your affected hand.  1. Roll putty into a ball.  2. Make into a pancake.  3. Roll putty into a roll.  4. Pinch along log with first finger and thumb.   5. Make into a ball.  6. Roll it back into a log.   7. Pinch using thumb and side of first finger.  8. Roll into a ball, then flatten into a pancake.  9. Using your fingers, make putty into a mountain.  10. Roll putty back into a ball and squeeze gently for 2-3 minutes.   

## 2022-11-04 ENCOUNTER — Ambulatory Visit (INDEPENDENT_AMBULATORY_CARE_PROVIDER_SITE_OTHER): Payer: BC Managed Care – PPO | Admitting: Orthopedic Surgery

## 2022-11-04 VITALS — BP 134/80 | HR 100

## 2022-11-04 DIAGNOSIS — M1811 Unilateral primary osteoarthritis of first carpometacarpal joint, right hand: Secondary | ICD-10-CM

## 2022-11-04 MED ORDER — TRAMADOL-ACETAMINOPHEN 37.5-325 MG PO TABS: 1.0000 | ORAL_TABLET | Freq: Four times a day (QID) | ORAL | 5 refills | Status: DC | PRN

## 2022-11-04 NOTE — Progress Notes (Signed)
   FOLLOW UP   Encounter Diagnosis  Name Primary?   Arthritis of carpometacarpal (CMC) joint of right thumb Yes     Chief Complaint  Patient presents with   Hand Pain    Follow up right thumb pain, no better PT was supposed to have called her back when brace come in patient never has heard back from them so she doesn't have the recommended brace. I called therapy and they stated they called her last week and left several messages. Got pt appt for 11/06/22. At 11:15     PRIOR TREATMENT: Meloxicam  Attempted to get a hand brace but as noted above the appointment was never completed  She is in severe pain it is at the [facility] joint there is swelling and some warmth to the joint  Recommend she see the hand specialist.  The OT people so they can get the splint on her Wednesday so I went ahead and put a temporary splint on today and ordered her some medication for pain   Meds ordered this encounter  Medications   traMADol-acetaminophen (ULTRACET) 37.5-325 MG tablet    Sig: Take 1 tablet by mouth every 6 (six) hours as needed.    Dispense:  30 tablet    Refill:  5

## 2022-11-06 ENCOUNTER — Encounter (HOSPITAL_COMMUNITY): Payer: BC Managed Care – PPO | Admitting: Occupational Therapy

## 2022-11-13 ENCOUNTER — Ambulatory Visit: Payer: BC Managed Care – PPO | Admitting: Nutrition

## 2022-11-20 ENCOUNTER — Ambulatory Visit (HOSPITAL_COMMUNITY): Payer: BC Managed Care – PPO | Attending: Orthopedic Surgery | Admitting: Occupational Therapy

## 2022-11-20 ENCOUNTER — Encounter (HOSPITAL_COMMUNITY): Payer: Self-pay | Admitting: Occupational Therapy

## 2022-11-20 ENCOUNTER — Telehealth: Payer: Self-pay | Admitting: Orthopedic Surgery

## 2022-11-20 DIAGNOSIS — M79644 Pain in right finger(s): Secondary | ICD-10-CM | POA: Insufficient documentation

## 2022-11-20 DIAGNOSIS — R29898 Other symptoms and signs involving the musculoskeletal system: Secondary | ICD-10-CM | POA: Diagnosis not present

## 2022-11-20 DIAGNOSIS — M1811 Unilateral primary osteoarthritis of first carpometacarpal joint, right hand: Secondary | ICD-10-CM

## 2022-11-20 DIAGNOSIS — R600 Localized edema: Secondary | ICD-10-CM | POA: Diagnosis not present

## 2022-11-20 DIAGNOSIS — G8929 Other chronic pain: Secondary | ICD-10-CM

## 2022-11-20 NOTE — Telephone Encounter (Signed)
Referral did not get entered. I have put it in and sent it I called her to give her the information below which should have been given at office visit and was not.  [facility] will call you with appointment. If you have not heard from them in 2-3 business days call them to schedule [phone]

## 2022-11-20 NOTE — Therapy (Signed)
OUTPATIENT OCCUPATIONAL THERAPY SPLINT TREATMENT DISCHARGE SUMMARY  Patient Name: Latoya Decker MRN: [id number] DOB:June 13, 1961, 62 y.o., female Today's Date: 11/20/2022  PCP: Dr. Syliva Overman REFERRING PROVIDER: Dr. Fuller Canada  END OF SESSION:  OT End of Session - 11/20/22 1142     Visit Number 2    Number of Visits 2    Date for OT Re-Evaluation 11/10/22    Authorization Type BCBX    Authorization Time Period 60 visit limit    Authorization - Visit Number 2    Authorization - Number of Visits 60    OT Start Time 1115    OT Stop Time 1125    OT Time Calculation (min) 10 min    Activity Tolerance Patient tolerated treatment well    Behavior During Therapy [facility] for tasks assessed/performed              Past Medical History:  Diagnosis Date   Anxiety    Depression    Diverticulosis    Duodenal ulcer 01/05/2010   EGD Dr Steffanie Dunn, h pylori gastritis, completed treatment   Duodenitis    Eosinophilic gastroenteritis JAN 2015   DEC 2014: NL CMP/GIARDIA Ag   Family hx of colon cancer    Mother & sister   Gastritis    hospitalized 6/23-6/25 2011    Headache(784.0)    migraines   Helicobacter pylori gastritis    Hypertension    Nondisplaced fracture of distal phalanx of right thumb with routine healing 10/17/17 11/13/2017   PUD (peptic ulcer disease)    Dr Katrinka Blazing   S/P colonoscopy 12/27/2003   Dr Casimiro Needle   S/P endoscopy 12/27/2003   Dr Rosita Fire superficial ulcers GEJ, duodenitis,gastritis   Past Surgical History:  Procedure Laterality Date   ABDOMINAL HYSTERECTOMY     bilateral tubal ligation  1989   BIOPSY  06/10/2011   SLF: mild gastritis, bx benign, sb bx benign    CHOLECYSTECTOMY  1997   aph-Smith   COLONOSCOPY  05/2011   SLF: multiple hyperplastic sessile polyps, scattered diverticulosis. next TCS 05/2016 ([facility])   COLONOSCOPY N/A 04/21/2017   Dr. Darrick Penna: two 2-60mm colon polyps removed, diverticulosis, hemorrhoids. next TCS in 5 years.     COLONOSCOPY WITH PROPOFOL N/A 05/21/2022   Procedure: COLONOSCOPY WITH PROPOFOL;  Surgeon: Lanelle Bal, DO;  [facility];  Service: Endoscopy;  Laterality: N/A;  11:00 am   ESOPHAGOGASTRODUODENOSCOPY N/A 07/28/2013   EOS INOPHILIC GASTROENTERITIS   ESOPHAGOGASTRODUODENOSCOPY N/A 04/21/2017   Dr. Darrick Penna: mild gastritis with H.pylori present, esophageal biopsies negative.    MOLE REMOVAL  09/09/2022   PARTIAL HYSTERECTOMY  2007   1 ovary removed   POLYPECTOMY  06/10/2011   Procedure: POLYPECTOMY;  Surgeon: Arlyce Harman, MD;  Location: [facility];  Service: Endoscopy;;  Cecal polypectomy; Sigmoid colon polypectomies   POLYPECTOMY  05/21/2022   Procedure: POLYPECTOMY INTESTINAL;  Surgeon: Lanelle Bal, DO;  [facility];  Service: Endoscopy;;   TUBAL LIGATION     Patient Active Problem List   Diagnosis Date Noted   Seasonal and perennial allergic rhinitis 10/04/2022   Allergic conjunctivitis of both eyes 10/04/2022   Allergy with anaphylaxis due to food 10/04/2022   Type 2 diabetes mellitus with other specified complication ([facility]) 09/25/2022   Pain of right thumb 09/19/2022   Multiple food allergies 06/17/2022   Multiple pigmented nevi 06/17/2022   Onychomycosis 05/20/2021   Essential hypertension 11/29/2020   Vitamin D deficiency 07/04/2019   Helicobacter  pylori gastritis 04/16/2018   Tubular adenoma of colon 01/02/2018   Enlarged skin mole 01/02/2018   GERD (gastroesophageal reflux disease) 03/14/2017   Elevated alkaline phosphatase level 10/16/2016   Migraine variant without intractability 08/31/2012   Family history of colon cancer 05/14/2011   Duodenal ulcer without hemorrhage or perforation and without obstruction 01/12/2010   Obesity (BMI 30.0-34.9) 03/30/2008    ONSET DATE: approximately 2 months ago  REFERRING DIAG: right CMC joint arthritis  THERAPY DIAG:  Pain in right finger(s)  Localized edema  Other symptoms and signs involving  the musculoskeletal system  Rationale for Evaluation and Treatment: Rehabilitation  SUBJECTIVE:   SUBJECTIVE STATEMENT: S: He gave me another splint when I went back but I couldn't wear it.   PERTINENT HISTORY: Pt is a 62 y/o female presenting with right thumb pain and edema. Pt with bone spur formation, narrowing subluxation, and arthritis.   PRECAUTIONS: None  WEIGHT BEARING RESTRICTIONS: No  PAIN:  Are you having pain? No  FALLS: Has patient fallen in last 6 months? No  PLOF: Independent  PATIENT GOALS: To have less pain.   NEXT MD VISIT: 11/04/22  OBJECTIVE:   HAND DOMINANCE: Right  ADLs: Overall ADLs: pt reports difficulty with ADLs due to pain, has difficulty with tying shoes, lifting, gripping. Sometimes has difficulty sleeping. Uses the computer all day at work and she does experience pain with this as well.    FUNCTIONAL OUTCOME MEASURES: Quick Dash: 47.73  UPPER EXTREMITY ROM:      Active ROM Right eval Right 11/20/22  Thumb MCP (0-60) 0-28 0-30  Thumb IP (0-80) 0-72 0-72  (Blank rows = not tested)   HAND FUNCTION: Grip strength: Right: 40 lbs; Left: 66 lbs, Lateral pinch: Right: 9 lbs, Left: 15 lbs, and 3 point pinch: Right: 11 lbs, Left: 12 lbs Grip strength: Right: 30 lbs; Lateral pinch: Right: 14 lbs, and 3 point pinch: Right: 13 lbs  EDEMA: Right thenar eminence group: 18 cm; 5/8-18.75cm                 Left thenar eminence group: 15.5 cm  COGNITION: Overall cognitive status: Within functional limits for tasks assessed  OBSERVATIONS: Pt with red blotchy skin along thenar and cmc regions, reports this is random, doesn't seem to be caused by any specific task or movement.    TODAY'S TREATMENT:                                                                                                                              DATE:  11/20/22: -Pt fitted for right CMC Meta-Grip Push splint size 2. Pt demonstrates independence in donning and doffing splint.  Reports it fits comfortably.  -Educated pt on splint wear and care, purpose of splint to provide support and stability to the [facility] joint. Discussed edema and intermittent pain that radiates.   PATIENT EDUCATION: Education details: splint wear and care Person educated: Patient Education method: Explanation,  Demonstration, and Handouts Education comprehension: verbalized understanding and returned demonstration  HOME EXERCISE PROGRAM: Eval: red theraputty for grip and pinch strengthening  GOALS: Goals reviewed with patient? Yes  SHORT TERM GOALS: Target date: 11/10/22  Pt will be provided with and educated on HEP to improve thumb mobility and strength required for use during ADL completion.   Goal status: MET  2.  Pt will be provided with splint and demonstrate independence in donning and doffing.   Goal status: MET  3.  Pt will be educated on wear and care for splint and accessories to promote appropriate use and effectiveness.   Goal status: MET   ASSESSMENT:  CLINICAL IMPRESSION: Pt reports she is wearing the edema glove and it is helping her swelling. The MD gave her another thumb splint but it was too painful and she couldn't wear it. Provided CMC Meta-Grip Push splint size 2 today. Splint fits pt well and pt demonstrates independence in donning and doffing. Discussed wear and care, as well as continuing HEP. Pt has not heard from a hand specialist, encouraged pt to call MD and follow up on that referral.    PERFORMANCE DEFICITS: in functional skills including ADLs, IADLs, edema, strength, pain, and UE functional use     PLAN:  OT FREQUENCY: every other week  OT DURATION: 4 weeks  PLANNED INTERVENTIONS: self care/ADL training, therapeutic exercise, therapeutic activity, manual therapy, splinting, patient/family education, and DME and/or AE instructions  CONSULTED AND AGREED WITH PLAN OF CARE: Patient  PLAN FOR NEXT SESSION: No further OT services required at this  time.    OCCUPATIONAL THERAPY DISCHARGE SUMMARY  Visits from Start of Care: 2  Current functional level related to goals / functional outcomes: Pt independent in splint donning/doffing. Educated on wear and care.    Remaining deficits: Pain in [facility] joint, intermittent radiating to tip of thumb   Education / Equipment: HEP, splint wear and care   Patient agrees to discharge. Patient goals were met. Patient is being discharged due to meeting the stated rehab goals.Ezra Sites, OTR/L  [phone] 11/20/2022, 11:42 AM

## 2022-11-20 NOTE — Telephone Encounter (Signed)
Dr. Mort Sawyers pt - pt lvm stating that she was referred to a hand specialist two weeks ago and she hasn't heard anything.

## 2022-11-25 DIAGNOSIS — M1811 Unilateral primary osteoarthritis of first carpometacarpal joint, right hand: Secondary | ICD-10-CM | POA: Diagnosis not present

## 2022-12-27 ENCOUNTER — Ambulatory Visit: Payer: BC Managed Care – PPO | Admitting: Family Medicine

## 2022-12-27 DIAGNOSIS — M1811 Unilateral primary osteoarthritis of first carpometacarpal joint, right hand: Secondary | ICD-10-CM | POA: Diagnosis not present

## 2022-12-30 ENCOUNTER — Other Ambulatory Visit: Payer: Self-pay | Admitting: Orthopedic Surgery

## 2022-12-31 ENCOUNTER — Ambulatory Visit: Payer: BC Managed Care – PPO | Admitting: Nutrition

## 2023-02-04 ENCOUNTER — Other Ambulatory Visit: Payer: Self-pay | Admitting: Family Medicine

## 2023-02-04 ENCOUNTER — Encounter: Payer: Self-pay | Admitting: Family Medicine

## 2023-02-04 ENCOUNTER — Ambulatory Visit (INDEPENDENT_AMBULATORY_CARE_PROVIDER_SITE_OTHER): Payer: BC Managed Care – PPO | Admitting: Family Medicine

## 2023-02-04 VITALS — BP 144/82 | HR 100 | Ht 67.0 in | Wt 211.0 lb

## 2023-02-04 DIAGNOSIS — E669 Obesity, unspecified: Secondary | ICD-10-CM

## 2023-02-04 DIAGNOSIS — M79644 Pain in right finger(s): Secondary | ICD-10-CM

## 2023-02-04 DIAGNOSIS — E559 Vitamin D deficiency, unspecified: Secondary | ICD-10-CM

## 2023-02-04 DIAGNOSIS — I10 Essential (primary) hypertension: Secondary | ICD-10-CM

## 2023-02-04 DIAGNOSIS — Z23 Encounter for immunization: Secondary | ICD-10-CM

## 2023-02-04 DIAGNOSIS — Z13220 Encounter for screening for lipoid disorders: Secondary | ICD-10-CM

## 2023-02-04 DIAGNOSIS — J302 Other seasonal allergic rhinitis: Secondary | ICD-10-CM

## 2023-02-04 DIAGNOSIS — J3089 Other allergic rhinitis: Secondary | ICD-10-CM

## 2023-02-04 DIAGNOSIS — E1169 Type 2 diabetes mellitus with other specified complication: Secondary | ICD-10-CM | POA: Diagnosis not present

## 2023-02-04 DIAGNOSIS — Z1231 Encounter for screening mammogram for malignant neoplasm of breast: Secondary | ICD-10-CM

## 2023-02-04 LAB — POCT GLYCOSYLATED HEMOGLOBIN (HGB A1C): HbA1c, POC (controlled diabetic range): 7.4 % — AB (ref 0.0–7.0)

## 2023-02-04 MED ORDER — TIRZEPATIDE 2.5 MG/0.5ML ~~LOC~~ SOAJ: 2.5000 mg | SUBCUTANEOUS | 0 refills | Status: DC

## 2023-02-04 MED ORDER — OLMESARTAN MEDOXOMIL 20 MG PO TABS: 20.0000 mg | ORAL_TABLET | Freq: Every day | ORAL | 2 refills | Status: DC

## 2023-02-04 MED ORDER — ROSUVASTATIN CALCIUM 5 MG PO TABS
ORAL_TABLET | ORAL | 1 refills | Status: DC
Start: 2023-02-04 — End: 2023-07-18

## 2023-02-04 MED ORDER — TIRZEPATIDE 5 MG/0.5ML ~~LOC~~ SOAJ: 5.0000 mg | SUBCUTANEOUS | 2 refills | Status: DC

## 2023-02-04 MED ORDER — LANCETS MISC. MISC
1.0000 | Freq: Three times a day (TID) | 0 refills | Status: AC
Start: 2023-02-04 — End: 2023-03-06

## 2023-02-04 MED ORDER — LANCET DEVICE MISC
1.0000 | Freq: Three times a day (TID) | 0 refills | Status: AC
Start: 2023-02-04 — End: 2023-03-06

## 2023-02-04 MED ORDER — BLOOD GLUCOSE MONITORING SUPPL DEVI: 1.0000 | Freq: Three times a day (TID) | 0 refills | Status: AC

## 2023-02-04 MED ORDER — BLOOD GLUCOSE TEST VI STRP: 1.0000 | ORAL_STRIP | Freq: Three times a day (TID) | 0 refills | Status: AC

## 2023-02-04 NOTE — Assessment & Plan Note (Signed)
  Patient re-educated about  the importance of commitment to a  minimum of 150 minutes of exercise per week as able.  The importance of healthy food choices with portion control discussed, as well as eating regularly and within a 12 hour window most days. The need to choose "clean , green" food 50 to 75% of the time is discussed, as well as to make water the primary drink and set a goal of 64 ounces water daily.       02/04/2023    2:06 PM 10/04/2022    2:16 PM 09/23/2022   11:35 AM  Weight /BMI  Weight 211 lb 0.6 oz 211 lb 4 oz 207 lb  Height 5\' 7"  (1.702 m)  5\' 7"  (1.702 m)  BMI 33.05 kg/m2 33.09 kg/m2 32.42 kg/m2    Unchnaged start mounjaro

## 2023-02-04 NOTE — Assessment & Plan Note (Signed)
Managed by Allergist and currently controlled

## 2023-02-04 NOTE — Patient Instructions (Addendum)
F/U in 6 to 7 weeks, call if you need me before  Plls prescribe once daily test supplies and meter, pls test before breakfast and record Goal for fasting blood sugar ranges from 80 to 120.   TdAP today  Pls schedule Jan 10 or after mammogram at checkout  Nurse pls give paperwork re diabetic ed classes for community  Work excuse for today return tomorrow  New additional medication for blood pressure olmesartan 20 mg daily, continue amlodipine as before  New medication for diabetes, once weekly Mounjaro injections,, send  a message/ call if not covered  Three times weekly  Crestor as a diabetic for heart protection  GycoHB in office today,   Fasting lipid, cmp and EGFr m, vit D and microalb , 5 to 7  days before next visit  Thanks for choosing [facility], we consider it a privelige to serve you.

## 2023-02-04 NOTE — Assessment & Plan Note (Signed)
Upcoming surgery planned

## 2023-02-04 NOTE — Assessment & Plan Note (Signed)
Uncontrolled , add olmesartan 20 mg daily DASH diet and commitment to daily physical activity for a minimum of 30 minutes discussed and encouraged, as a part of hypertension management. The importance of attaining a healthy weight is also discussed.     02/04/2023    2:44 PM 02/04/2023    2:13 PM 02/04/2023    2:06 PM 11/04/2022   10:03 AM 10/04/2022    2:16 PM 09/23/2022   11:35 AM 09/19/2022    4:01 PM  BP/Weight  Systolic BP 144 152 151 134 134 138 142  Diastolic BP 82 80 82 80 74 75 80  Wt. (Lbs)   211.04  211.25 207   BMI   33.05 kg/m2  33.09 kg/m2 32.42 kg/m2

## 2023-02-04 NOTE — Assessment & Plan Note (Signed)
Deteriorated start mounjaro and needs to test once daily, refer again for diabetic ed Ms. Coulibaly is reminded of the importance of commitment to daily physical activity for 30 minutes or more, as able and the need to limit carbohydrate intake to 30 to 60 grams per meal to help with blood sugar control.   The need to take medication as prescribed, test blood sugar as directed, and to call between visits if there is a concern that blood sugar is uncontrolled is also discussed.   Ms. Taher is reminded of the importance of daily foot exam, annual eye examination, and good blood sugar, blood pressure and cholesterol control.     Latest Ref Rng & Units 02/04/2023    2:49 PM 09/19/2022    4:33 PM 06/14/2022    1:59 PM 05/18/2021    4:50 PM 11/30/2020    8:22 AM  Diabetic Labs  HbA1c 0.0 - 7.0 % 7.4  6.8  6.8  6.2  6.1   Chol 100 - 199 mg/dL   161   096   HDL >04 mg/dL   40   43   Calc LDL 0 - 99 mg/dL   80   88   Triglycerides 0 - 149 mg/dL   540   981   Creatinine 0.57 - 1.00 mg/dL  1.91  4.78  2.95  6.21       02/04/2023    2:44 PM 02/04/2023    2:13 PM 02/04/2023    2:06 PM 11/04/2022   10:03 AM 10/04/2022    2:16 PM 09/23/2022   11:35 AM 09/19/2022    4:01 PM  BP/Weight  Systolic BP 144 152 151 134 134 138 142  Diastolic BP 82 80 82 80 74 75 80  Wt. (Lbs)   211.04  211.25 207   BMI   33.05 kg/m2  33.09 kg/m2 32.42 kg/m2       02/04/2023    2:00 PM  Foot/eye exam completion dates  Foot Form Completion Done

## 2023-02-04 NOTE — Progress Notes (Signed)
Latoya Decker     MRN: [id number]      DOB: 1961/03/06  Chief Complaint  Patient presents with   Chronic Care Management    Follow up visit. Pt reports upcoming surgery on 08/29 for her right hand thumb. Needs a work note for today.    HPI Latoya Decker is here for follow up and re-evaluation of chronic medical conditions, medication management and review of any available recent lab and radiology data.  Preventive health is updated, specifically  Cancer screening and Immunization.   . The PT denies any adverse reactions to current medications since the last visit.  ROS Denies recent fever or chills. Denies sinus pressure, nasal congestion, ear pain or sore throat. Denies chest congestion, productive cough or wheezing. Denies chest pains, palpitations and leg swelling Denies abdominal pain, nausea, vomiting,diarrhea or constipation.   Denies dysuria, frequency, hesitancy or incontinence. Denies headaches, seizures, numbness, or tingling. Denies depression, anxiety or insomnia. Denies skin break down or rash.   PE  BP (!) 144/82   Pulse 100   Ht 5\' 7"  (1.702 m)   Wt 211 lb 0.6 oz (95.7 kg)   SpO2 97%   BMI 33.05 kg/m   Patient alert and oriented and in no cardiopulmonary distress.  HEENT: No facial asymmetry, EOMI,     Neck supple .  Chest: Clear to auscultation bilaterally.  CVS: S1, S2 no murmurs, no S3.Regular rate.  ABD: Soft non tender.   Ext: No edema  MS: Adequate ROM spine, shoulders, hips and knees.  Skin: Intact, no ulcerations or rash noted.  Psych: Good eye contact, normal affect. Memory intact not anxious or depressed appearing.  CNS: CN 2-12 intact, power,  normal throughout.no focal deficits noted.   Assessment & Plan  Type 2 diabetes mellitus with other specified complication (HCC) Deteriorated start mounjaro and needs to test once daily, refer again for diabetic ed Latoya Decker is reminded of the importance of commitment to daily physical  activity for 30 minutes or more, as able and the need to limit carbohydrate intake to 30 to 60 grams per meal to help with blood sugar control.   The need to take medication as prescribed, test blood sugar as directed, and to call between visits if there is a concern that blood sugar is uncontrolled is also discussed.   Latoya Decker is reminded of the importance of daily foot exam, annual eye examination, and good blood sugar, blood pressure and cholesterol control.     Latest Ref Rng & Units 02/04/2023    2:49 PM 09/19/2022    4:33 PM 06/14/2022    1:59 PM 05/18/2021    4:50 PM 11/30/2020    8:22 AM  Diabetic Labs  HbA1c 0.0 - 7.0 % 7.4  6.8  6.8  6.2  6.1   Chol 100 - 199 mg/dL   782   956   HDL >21 mg/dL   40   43   Calc LDL 0 - 99 mg/dL   80   88   Triglycerides 0 - 149 mg/dL   308   657   Creatinine 0.57 - 1.00 mg/dL  8.46  9.62  9.52  8.41       02/04/2023    2:44 PM 02/04/2023    2:13 PM 02/04/2023    2:06 PM 11/04/2022   10:03 AM 10/04/2022    2:16 PM 09/23/2022   11:35 AM 09/19/2022    4:01 PM  BP/Weight  Systolic BP  144 152 151 134 134 138 142  Diastolic BP 82 80 82 80 74 75 80  Wt. (Lbs)   211.04  211.25 207   BMI   33.05 kg/m2  33.09 kg/m2 32.42 kg/m2       02/04/2023    2:00 PM  Foot/eye exam completion dates  Foot Form Completion Done        Pain of right thumb Upcoming surgery planned  Obesity (BMI 30.0-34.9)  Patient re-educated about  the importance of commitment to a  minimum of 150 minutes of exercise per week as able.  The importance of healthy food choices with portion control discussed, as well as eating regularly and within a 12 hour window most days. The need to choose "clean , green" food 50 to 75% of the time is discussed, as well as to make water the primary drink and set a goal of 64 ounces water daily.       02/04/2023    2:06 PM 10/04/2022    2:16 PM 09/23/2022   11:35 AM  Weight /BMI  Weight 211 lb 0.6 oz 211 lb 4 oz 207 lb  Height 5\' 7"   (1.702 m)  5\' 7"  (1.702 m)  BMI 33.05 kg/m2 33.09 kg/m2 32.42 kg/m2    Unchnaged start mounjaro  Essential hypertension Uncontrolled , add olmesartan 20 mg daily DASH diet and commitment to daily physical activity for a minimum of 30 minutes discussed and encouraged, as a part of hypertension management. The importance of attaining a healthy weight is also discussed.     02/04/2023    2:44 PM 02/04/2023    2:13 PM 02/04/2023    2:06 PM 11/04/2022   10:03 AM 10/04/2022    2:16 PM 09/23/2022   11:35 AM 09/19/2022    4:01 PM  BP/Weight  Systolic BP 144 152 151 134 134 138 142  Diastolic BP 82 80 82 80 74 75 80  Wt. (Lbs)   211.04  211.25 207   BMI   33.05 kg/m2  33.09 kg/m2 32.42 kg/m2        Seasonal and perennial allergic rhinitis Managed by Allergist and currently controlled

## 2023-03-03 ENCOUNTER — Other Ambulatory Visit: Payer: Self-pay | Admitting: Family Medicine

## 2023-03-10 ENCOUNTER — Other Ambulatory Visit: Payer: Self-pay | Admitting: Family Medicine

## 2023-03-13 ENCOUNTER — Encounter (HOSPITAL_BASED_OUTPATIENT_CLINIC_OR_DEPARTMENT_OTHER): Payer: Self-pay

## 2023-03-13 ENCOUNTER — Ambulatory Visit (HOSPITAL_BASED_OUTPATIENT_CLINIC_OR_DEPARTMENT_OTHER): Admit: 2023-03-13 | Payer: BC Managed Care – PPO | Admitting: Orthopedic Surgery

## 2023-03-13 DIAGNOSIS — M1811 Unilateral primary osteoarthritis of first carpometacarpal joint, right hand: Secondary | ICD-10-CM | POA: Diagnosis not present

## 2023-03-13 SURGERY — CARPOMETACARPEL (CMC) SUSPENSION PLASTY
Anesthesia: Choice | Laterality: Right

## 2023-03-21 ENCOUNTER — Encounter: Payer: Self-pay | Admitting: Pharmacist

## 2023-03-21 DIAGNOSIS — M25641 Stiffness of right hand, not elsewhere classified: Secondary | ICD-10-CM | POA: Diagnosis not present

## 2023-03-21 DIAGNOSIS — M79641 Pain in right hand: Secondary | ICD-10-CM | POA: Diagnosis not present

## 2023-03-21 DIAGNOSIS — M1811 Unilateral primary osteoarthritis of first carpometacarpal joint, right hand: Secondary | ICD-10-CM | POA: Diagnosis not present

## 2023-03-25 ENCOUNTER — Ambulatory Visit (INDEPENDENT_AMBULATORY_CARE_PROVIDER_SITE_OTHER): Payer: BC Managed Care – PPO | Admitting: Family Medicine

## 2023-03-25 ENCOUNTER — Encounter: Payer: Self-pay | Admitting: Family Medicine

## 2023-03-25 VITALS — BP 148/88 | HR 96 | Ht 67.0 in | Wt 210.1 lb

## 2023-03-25 DIAGNOSIS — R748 Abnormal levels of other serum enzymes: Secondary | ICD-10-CM

## 2023-03-25 DIAGNOSIS — K219 Gastro-esophageal reflux disease without esophagitis: Secondary | ICD-10-CM | POA: Diagnosis not present

## 2023-03-25 DIAGNOSIS — E559 Vitamin D deficiency, unspecified: Secondary | ICD-10-CM | POA: Diagnosis not present

## 2023-03-25 DIAGNOSIS — E785 Hyperlipidemia, unspecified: Secondary | ICD-10-CM

## 2023-03-25 DIAGNOSIS — E669 Obesity, unspecified: Secondary | ICD-10-CM | POA: Diagnosis not present

## 2023-03-25 DIAGNOSIS — Z13220 Encounter for screening for lipoid disorders: Secondary | ICD-10-CM | POA: Diagnosis not present

## 2023-03-25 DIAGNOSIS — I10 Essential (primary) hypertension: Secondary | ICD-10-CM | POA: Diagnosis not present

## 2023-03-25 DIAGNOSIS — E1169 Type 2 diabetes mellitus with other specified complication: Secondary | ICD-10-CM

## 2023-03-25 MED ORDER — MELOXICAM 15 MG PO TABS: ORAL_TABLET | ORAL | 1 refills | Status: DC

## 2023-03-25 MED ORDER — PANTOPRAZOLE SODIUM 20 MG PO TBEC: 20.0000 mg | DELAYED_RELEASE_TABLET | Freq: Every day | ORAL | 5 refills | Status: DC

## 2023-03-25 NOTE — Patient Instructions (Signed)
F/u in 7 weeks Labs today  Pls verify meds you are taking spercifically olmesartan 20 mg one daily and amlodipine 5 mg one daily, call back after 1 pm pls  Blood pressure still high  Thanks for choosing [facility], we consider it a privelige to serve you.

## 2023-03-25 NOTE — Assessment & Plan Note (Signed)
Uncontrolled, need to verify current meds she is taking before any changes ar made DASH diet and commitment to daily physical activity for a minimum of 30 minutes discussed and encouraged, as a part of hypertension management. The importance of attaining a healthy weight is also discussed.     03/25/2023   12:26 PM 03/25/2023   11:05 AM 02/04/2023    2:44 PM 02/04/2023    2:13 PM 02/04/2023    2:06 PM 11/04/2022   10:03 AM 10/04/2022    2:16 PM  BP/Weight  Systolic BP 148 149 144 152 151 134 134  Diastolic BP 88 83 82 80 82 80 74  Wt. (Lbs)  210.08   211.04  211.25  BMI  32.9 kg/m2   33.05 kg/m2  33.09 kg/m2

## 2023-03-25 NOTE — Progress Notes (Unsigned)
   Latoya Decker     MRN: [id number]      DOB: [birth year]  Chief Complaint  Patient presents with   Hypertension    F/u needs yearly eye exam not sure if she has had on or not.    Cold Extremity    Left leg getting cold at night.    Medication Refill    Xalatan, Protonix would like 800 mg tylenol for pain    Immunizations    Pt. Consents to flu vaccination.     HPI Latoya Decker is here for follow up and re-evaluation of chronic medical conditions, medication management and review of any available recent lab and radiology data.  Preventive health is updated, specifically  Cancer screening and Immunization.defers flu vaccine today due to pain   Questions or concerns regarding consultations or procedures which the PT has had in the interim are  addressed.recent right finger surgery, having pin , intolerant of narcotic pain med requests substitute The PT denies any adverse reactions to current medications since the last visit.  Uncertain as to what meds she is taking for bP and does not have meds with her, needs to call back about this    ROS Denies recent fever or chills. Denies sinus pressure, nasal congestion, ear pain or sore throat. Denies chest congestion, productive cough or wheezing. Denies chest pains, palpitations and leg swelling Denies abdominal pain, nausea, vomiting,diarrhea or constipation.   Denies dysuria, frequency, hesitancy or incontinence.  Denies headaches, seizures, numbness, or tingling. Denies depression, anxiety or insomnia. Denies skin break down or rash.   PE  BP (!) 149/83 (BP Location: Left Arm, Patient Position: Sitting, Cuff Size: Large)   Pulse 96   Ht 5\' 7"  (1.702 m)   Wt 210 lb 1.3 oz (95.3 kg)   SpO2 94%   BMI 32.90 kg/m   Patient alert and oriented and in no cardiopulmonary distress.Pt in pain  HEENT: No facial asymmetry, EOMI,     Neck supple .  Chest: Clear to auscultation bilaterally.  CVS: S1, S2 no murmurs, no S3.Regular  rate.  ABD: Soft non tender.   Ext: No edema  MS: Adequate ROM spine, shoulders, hips and knees.  Skin: Intact, no ulcerations or rash noted.  Psych: Good eye contact, normal affect. Memory intact not anxious or depressed appearing.  CNS: CN 2-12 intact, power,  normal throughout.no focal deficits noted.   Assessment & Plan  No problem-specific Assessment & Plan notes found for this encounter.

## 2023-03-25 NOTE — Assessment & Plan Note (Signed)
  Patient re-educated about  the importance of commitment to a  minimum of 150 minutes of exercise per week as able.  The importance of healthy food choices with portion control discussed, as well as eating regularly and within a 12 hour window most days. The need to choose "clean , green" food 50 to 75% of the time is discussed, as well as to make water the primary drink and set a goal of 64 ounces water daily.       03/25/2023   11:05 AM 02/04/2023    2:06 PM 10/04/2022    2:16 PM  Weight /BMI  Weight 210 lb 1.3 oz 211 lb 0.6 oz 211 lb 4 oz  Height 5\' 7"  (1.702 m) 5\' 7"  (1.702 m)   BMI 32.9 kg/m2 33.05 kg/m2 33.09 kg/m2    Unchanged

## 2023-03-25 NOTE — Assessment & Plan Note (Signed)
Latoya Decker is reminded of the importance of commitment to daily physical activity for 30 minutes or more, as able and the need to limit carbohydrate intake to 30 to 60 grams per meal to help with blood sugar control.   The need to take medication as prescribed, test blood sugar as directed, and to call between visits if there is a concern that blood sugar is uncontrolled is also discussed.   Latoya Decker is reminded of the importance of daily foot exam, annual eye examination, and good blood sugar, blood pressure and cholesterol control.     Latest Ref Rng & Units 02/04/2023    2:49 PM 09/19/2022    4:33 PM 06/14/2022    1:59 PM 05/18/2021    4:50 PM 11/30/2020    8:22 AM  Diabetic Labs  HbA1c 0.0 - 7.0 % 7.4  6.8  6.8  6.2  6.1   Chol 100 - 199 mg/dL   474   259   HDL >56 mg/dL   40   43   Calc LDL 0 - 99 mg/dL   80   88   Triglycerides 0 - 149 mg/dL   387   564   Creatinine 0.57 - 1.00 mg/dL  3.32  9.51  8.84  1.66       03/25/2023   12:26 PM 03/25/2023   11:05 AM 02/04/2023    2:44 PM 02/04/2023    2:13 PM 02/04/2023    2:06 PM 11/04/2022   10:03 AM 10/04/2022    2:16 PM  BP/Weight  Systolic BP 148 149 144 152 151 134 134  Diastolic BP 88 83 82 80 82 80 74  Wt. (Lbs)  210.08   211.04  211.25  BMI  32.9 kg/m2   33.05 kg/m2  33.09 kg/m2      02/04/2023    2:00 PM  Foot/eye exam completion dates  Foot Form Completion Done      Updated lab today, needs referral for eye exam, inc mounjaro dose each month not at goal, re address diabetic ed

## 2023-03-25 NOTE — Assessment & Plan Note (Signed)
Controlled, no change in medication  

## 2023-03-26 MED ORDER — VITAMIN D (ERGOCALCIFEROL) 1.25 MG (50000 UNIT) PO CAPS: 50000.0000 [IU] | ORAL_CAPSULE | ORAL | 5 refills | Status: DC

## 2023-03-26 MED ORDER — TIRZEPATIDE 7.5 MG/0.5ML ~~LOC~~ SOAJ: 7.5000 mg | SUBCUTANEOUS | 0 refills | Status: DC

## 2023-03-26 NOTE — Assessment & Plan Note (Signed)
[place] rUQ to eval and GI rferral

## 2023-03-27 ENCOUNTER — Encounter: Payer: Self-pay | Admitting: Family Medicine

## 2023-03-27 DIAGNOSIS — E785 Hyperlipidemia, unspecified: Secondary | ICD-10-CM | POA: Insufficient documentation

## 2023-03-27 DIAGNOSIS — M1811 Unilateral primary osteoarthritis of first carpometacarpal joint, right hand: Secondary | ICD-10-CM | POA: Diagnosis not present

## 2023-03-27 NOTE — Assessment & Plan Note (Signed)
Recommend daily OTC vit D3 , 2000 units

## 2023-03-27 NOTE — Assessment & Plan Note (Signed)
Hyperlipidemia:Low fat diet discussed and encouraged.   Lipid Panel  Lab Results  Component Value Date   CHOL 122 03/25/2023   HDL 44 03/25/2023   LDLCALC 54 03/25/2023   TRIG 138 03/25/2023   CHOLHDL 2.8 03/25/2023     Controlled, no change in medication

## 2023-03-28 ENCOUNTER — Other Ambulatory Visit: Payer: Self-pay | Admitting: Family Medicine

## 2023-03-30 NOTE — Progress Notes (Unsigned)
GI Office Note    Referring Provider: Kerri Perches, MD Primary Care Physician:  Kerri Perches, MD  Primary Gastroenterologist: Hennie Duos. Marletta Lor, DO   Chief Complaint   No chief complaint on file.  History of Present Illness   Latoya Decker is a 62 y.o. female presenting today for further evaluation of elevated alkaline phosphatase at request of Dr. Lodema Hong. Patient last seen at time of colonoscopy in 05/2022, last seen in the office in 07/2018. History of H.pylori gastritis s/p confirmed eradication in 2020.   In 2019 work up of elevated AP. Normal GGT, negative AMA. Advised patient at that time to seek further work up of non-liver sources of AP by PCP. CT A/P with contrast 04/2018 showed mild hepatic steatosis with no biliary dilation.    Component     Latest Ref Rng 04/16/2018 04/18/2018 07/01/2019 11/30/2020 05/18/2021  Albumin     3.9 - 4.9 g/dL 4.0  4.4   4.6  4.4   AST     0 - 40 IU/L 18  20  15  12  14    ALT     0 - 32 IU/L 18  18  14  12  10    Alkaline Phosphatase     44 - 121 IU/L 159 (H)  157 (H)   204 (H)  191 (H)   Total Bilirubin     0.0 - 1.2 mg/dL 0.5  0.7  0.3  0.7  0.2    Component     Latest Ref Rng 06/14/2022 09/19/2022 03/25/2023  Albumin     3.9 - 4.9 g/dL 4.6   4.5   AST     0 - 40 IU/L 16   20   ALT     0 - 32 IU/L 13   19   Alkaline Phosphatase     44 - 121 IU/L 182 (H)   191 (H)   Total Bilirubin     0.0 - 1.2 mg/dL 0.6   0.3     Legend: (H) High   Colonoscopy 05/2022: -diverticulosis in sigmoid colon, descending colon and transverse colon -three 4-82mm polyps in transverse colon and ascending colon, tubular adenoma -next colonoscopy five years  EGD 04/2017: normal esophageal biopsies, mild H.pylori gastritis.   Medications   Current Outpatient Medications  Medication Sig Dispense Refill   amLODipine (NORVASC) 5 MG tablet TAKE 1 TABLET BY MOUTH EVERY DAY 90 tablet 1   azelastine (ASTELIN) 0.1 % nasal spray Place 1  spray into both nostrils 2 (two) times daily as needed for rhinitis or allergies. Use in each nostril as directed 30 mL 5   Blood Glucose Monitoring Suppl DEVI 1 each by Does not apply route in the morning, at noon, and at bedtime. May substitute to any manufacturer covered by patient's insurance. 1 each 0   cetirizine (ZYRTEC ALLERGY) 10 MG tablet Take 1 tablet (10 mg total) by mouth daily as needed for allergies or rhinitis. 30 tablet 5   Lancets (ONETOUCH DELICA PLUS LANCET33G) MISC USE IN THE MORNING, AT NOON, AND AT BEDTIME. MAY SUBSTITUTE 100 each 1   latanoprost (XALATAN) 0.005 % ophthalmic solution 1 drop at bedtime.     meloxicam (MOBIC) 15 MG tablet Take one tablet by mout once daily, as needed, for pain 30 tablet 1   olmesartan (BENICAR) 20 MG tablet TAKE 1 TABLET BY MOUTH EVERY DAY 90 tablet 1   Olopatadine HCl 0.2 % SOLN Apply 1  drop to eye daily as needed (itchy watery eyes). 2.5 mL 5   pantoprazole (PROTONIX) 20 MG tablet Take 1 tablet (20 mg total) by mouth daily. 30 tablet 5   rosuvastatin (CRESTOR) 5 MG tablet Take one tablet by mouth every Monday, Wednesday, Friday 36 tablet 1   tirzepatide (MOUNJARO) 5 MG/0.5ML Pen Inject 5 mg into the skin once a week. 2 mL 2   [START ON 04/29/2023] tirzepatide (MOUNJARO) 7.5 MG/0.5ML Pen Inject 7.5 mg into the skin once a week. 2 mL 0   tiZANidine (ZANAFLEX) 4 MG tablet Take 4 mg by mouth 2 (two) times daily as needed (migraines).     topiramate (TOPAMAX) 25 MG tablet Take 25 mg by mouth 3 (three) times daily as needed (migraines).     traMADol-acetaminophen (ULTRACET) 37.5-325 MG tablet Take 1 tablet by mouth every 6 (six) hours as needed. (Patient not taking: Reported on 03/25/2023) 30 tablet 5   Vitamin D, Ergocalciferol, (DRISDOL) 1.25 MG (50000 UNIT) CAPS capsule Take 1 capsule (50,000 Units total) by mouth every 7 (seven) days. 5 capsule 5   No current facility-administered medications for this visit.    Allergies   Allergies as of  03/31/2023 - Review Complete 03/27/2023  Allergen Reaction Noted   Codeine Nausea And Vomiting 06/30/2008   Hydrocodone Nausea And Vomiting 01/12/2010   Lidocaine viscous hcl  08/25/2013    Past Medical History   Past Medical History:  Diagnosis Date   Anxiety    Depression    Diverticulosis    Duodenal ulcer 01/05/2010   EGD Dr Steffanie Dunn, h pylori gastritis, completed treatment   Duodenitis    Eosinophilic gastroenteritis JAN 2015   DEC 2014: NL CMP/GIARDIA Ag   Family hx of colon cancer    Mother & sister   Gastritis    hospitalized 6/23-6/25 2011    Headache(784.0)    migraines   Helicobacter pylori gastritis    Hypertension    Nondisplaced fracture of distal phalanx of right thumb with routine healing 10/17/17 11/13/2017   PUD (peptic ulcer disease)    Dr Katrinka Blazing   S/P colonoscopy 12/27/2003   Dr Casimiro Needle   S/P endoscopy 12/27/2003   Dr Rosita Fire superficial ulcers GEJ, duodenitis,gastritis    Past Surgical History   Past Surgical History:  Procedure Laterality Date   ABDOMINAL HYSTERECTOMY     bilateral tubal ligation  1989   BIOPSY  06/10/2011   SLF: mild gastritis, bx benign, sb bx benign    CHOLECYSTECTOMY  1997   aph-Smith   COLONOSCOPY  05/2011   SLF: multiple hyperplastic sessile polyps, scattered diverticulosis. next TCS 05/2016 ([facility])   COLONOSCOPY N/A 04/21/2017   Dr. Darrick Penna: two 2-92mm colon polyps removed, diverticulosis, hemorrhoids. next TCS in 5 years.    COLONOSCOPY WITH PROPOFOL N/A 05/21/2022   Procedure: COLONOSCOPY WITH PROPOFOL;  Surgeon: Lanelle Bal, DO;  Location: [facility];  Service: Endoscopy;  Laterality: N/A;  11:00 am   ESOPHAGOGASTRODUODENOSCOPY N/A 07/28/2013   EOS INOPHILIC GASTROENTERITIS   ESOPHAGOGASTRODUODENOSCOPY N/A 04/21/2017   Dr. Darrick Penna: mild gastritis with H.pylori present, esophageal biopsies negative.    MOLE REMOVAL  09/09/2022   PARTIAL HYSTERECTOMY  2007   1 ovary removed   POLYPECTOMY  06/10/2011    Procedure: POLYPECTOMY;  Surgeon: Arlyce Harman, MD;  Location: [facility];  Service: Endoscopy;;  Cecal polypectomy; Sigmoid colon polypectomies   POLYPECTOMY  05/21/2022   Procedure: POLYPECTOMY INTESTINAL;  Surgeon: Lanelle Bal, DO;  Location:  [facility];  Service: Endoscopy;;   TUBAL LIGATION      Past Family History   Family History  Problem Relation Age of Onset   Colon cancer Mother 72   Diabetes Mother    Hypertension Mother    Stroke Mother    Heart attack Father    Diabetes Father    Hypertension Father    Colon cancer Sister 79   Lupus Brother    Parkinsonism Brother    Anesthesia problems Neg Hx    Hypotension Neg Hx    Malignant hyperthermia Neg Hx    Pseudochol deficiency Neg Hx     Past Social History   Social History   Socioeconomic History   Marital status: Married    Spouse name: Jillyn Hidden   Number of children: 3   Years of education: College   Highest education level: Not on file  Occupational History   Occupation: Dialysis Tech    Employer: [employer]    Employer: [employer]  Tobacco Use   Smoking status: Never    Passive exposure: Past   Smokeless tobacco: Never   Tobacco comments:    Never smoker  Vaping Use   Vaping status: Never Used  Substance and Sexual Activity   Alcohol use: No   Drug use: No   Sexual activity: Yes    Partners: Male    Birth control/protection: Surgical  Other Topics Concern   Not on file  Social History Narrative   Patient is married Jillyn Hidden) and lives at home with her husband and her granddaughter.   Patient has three adult children.   Patient works full-time.   Patient has a college education.   Patient is right-handed.   Patient drinks one cup of tea daily.   Social Determinants of Health   Financial Resource Strain: Not on file  Food Insecurity: Not on file  Transportation Needs: Not on file  Physical Activity: Not on file  Stress: Not on file  Social Connections: Not on file  Intimate  Partner Violence: Not on file    Review of Systems   General: Negative for anorexia, weight loss, fever, chills, fatigue, weakness. Eyes: Negative for vision changes.  ENT: Negative for hoarseness, difficulty swallowing , nasal congestion. CV: Negative for chest pain, angina, palpitations, dyspnea on exertion, peripheral edema.  Respiratory: Negative for dyspnea at rest, dyspnea on exertion, cough, sputum, wheezing.  GI: See history of present illness. GU:  Negative for dysuria, hematuria, urinary incontinence, urinary frequency, nocturnal urination.  MS: Negative for joint pain, low back pain.  Derm: Negative for rash or itching.  Neuro: Negative for weakness, abnormal sensation, seizure, frequent headaches, memory loss,  confusion.  Psych: Negative for anxiety, depression, suicidal ideation, hallucinations.  Endo: Negative for unusual weight change.  Heme: Negative for bruising or bleeding. Allergy: Negative for rash or hives.  Physical Exam   There were no vitals taken for this visit.   General: Well-nourished, well-developed in no acute distress.  Head: Normocephalic, atraumatic.   Eyes: Conjunctiva pink, no icterus. Mouth: Oropharyngeal mucosa moist and pink , no lesions erythema or exudate. Neck: Supple without thyromegaly, masses, or lymphadenopathy.  Lungs: Clear to auscultation bilaterally.  Heart: Regular rate and rhythm, no murmurs rubs or gallops.  Abdomen: Bowel sounds are normal, nontender, nondistended, no hepatosplenomegaly or masses,  no abdominal bruits or hernia, no rebound or guarding.   Rectal: *** Extremities: No lower extremity edema. No clubbing or deformities.  Neuro: Alert and oriented x  4 , grossly normal neurologically.  Skin: Warm and dry, no rash or jaundice.   Psych: Alert and cooperative, normal mood and affect.  Labs   *** Imaging Studies   No results found.  Assessment       PLAN   ***   Leanna Battles. Melvyn Neth, MHS, PA-C [facility]

## 2023-03-31 ENCOUNTER — Encounter: Payer: Self-pay | Admitting: Gastroenterology

## 2023-03-31 ENCOUNTER — Ambulatory Visit (INDEPENDENT_AMBULATORY_CARE_PROVIDER_SITE_OTHER): Payer: BC Managed Care – PPO | Admitting: Gastroenterology

## 2023-03-31 VITALS — BP 142/83 | HR 86 | Temp 98.0°F | Ht 66.0 in | Wt 212.6 lb

## 2023-03-31 DIAGNOSIS — K219 Gastro-esophageal reflux disease without esophagitis: Secondary | ICD-10-CM

## 2023-03-31 DIAGNOSIS — R748 Abnormal levels of other serum enzymes: Secondary | ICD-10-CM

## 2023-03-31 MED ORDER — OMEPRAZOLE 20 MG PO CPDR: 20.0000 mg | DELAYED_RELEASE_CAPSULE | Freq: Every day | ORAL | 1 refills | Status: DC | PRN

## 2023-03-31 NOTE — Patient Instructions (Signed)
RX sent to [facility] for omeprazole 20mg  daily as needed for acid reflux. Please complete labs and liver ultrasound. We will be in touch with results as available.

## 2023-04-02 ENCOUNTER — Encounter: Payer: Self-pay | Admitting: Family Medicine

## 2023-04-02 LAB — ALKALINE PHOSPHATASE, ISOENZYMES
BONE FRACTION: 41 % (ref 14–68)
INTESTINAL FRAC.: 0 % (ref 0–18)
LIVER FRACTION: 59 % (ref 18–85)

## 2023-04-02 LAB — HEPATIC FUNCTION PANEL
ALT: 20 IU/L (ref 0–32)
AST: 19 IU/L (ref 0–40)
Albumin: 4.6 g/dL (ref 3.9–4.9)
Alkaline Phosphatase: 193 IU/L — ABNORMAL HIGH (ref 44–121)
Bilirubin Total: 0.4 mg/dL (ref 0.0–1.2)
Bilirubin, Direct: 0.14 mg/dL (ref 0.00–0.40)
Total Protein: 7 g/dL (ref 6.0–8.5)

## 2023-04-02 LAB — GAMMA GT: GGT: 62 IU/L — ABNORMAL HIGH (ref 0–60)

## 2023-04-02 LAB — MITOCHONDRIAL ANTIBODIES: Mitochondrial Ab: <20 U (ref 0.0–20.0)

## 2023-04-02 MED ORDER — OLMESARTAN MEDOXOMIL 40 MG PO TABS: 40.0000 mg | ORAL_TABLET | Freq: Every day | ORAL | 3 refills | Status: DC

## 2023-04-02 NOTE — Addendum Note (Signed)
Addended by: Syliva Overman E on: 04/02/2023 11:41 AM   Modules accepted: Orders

## 2023-04-03 ENCOUNTER — Ambulatory Visit (HOSPITAL_COMMUNITY)
Admission: RE | Admit: 2023-04-03 | Discharge: 2023-04-03 | Disposition: A | Discharge status: Home / Self Care | Payer: BC Managed Care – PPO | Source: Ambulatory Visit | Attending: Family Medicine | Admitting: Family Medicine

## 2023-04-03 DIAGNOSIS — R748 Abnormal levels of other serum enzymes: Secondary | ICD-10-CM | POA: Insufficient documentation

## 2023-04-03 DIAGNOSIS — Z9049 Acquired absence of other specified parts of digestive tract: Secondary | ICD-10-CM | POA: Diagnosis not present

## 2023-04-07 ENCOUNTER — Other Ambulatory Visit: Payer: Self-pay | Admitting: *Deleted

## 2023-04-07 DIAGNOSIS — R748 Abnormal levels of other serum enzymes: Secondary | ICD-10-CM

## 2023-04-10 DIAGNOSIS — M25641 Stiffness of right hand, not elsewhere classified: Secondary | ICD-10-CM | POA: Diagnosis not present

## 2023-04-10 DIAGNOSIS — M1811 Unilateral primary osteoarthritis of first carpometacarpal joint, right hand: Secondary | ICD-10-CM | POA: Diagnosis not present

## 2023-04-10 DIAGNOSIS — M79641 Pain in right hand: Secondary | ICD-10-CM | POA: Diagnosis not present

## 2023-04-12 ENCOUNTER — Ambulatory Visit (HOSPITAL_COMMUNITY)
Admission: RE | Admit: 2023-04-12 | Discharge: 2023-04-12 | Disposition: A | Discharge status: Home / Self Care | Payer: BC Managed Care – PPO | Source: Ambulatory Visit | Attending: Gastroenterology | Admitting: Gastroenterology

## 2023-04-12 ENCOUNTER — Other Ambulatory Visit (HOSPITAL_COMMUNITY): Payer: Self-pay | Admitting: Gastroenterology

## 2023-04-12 DIAGNOSIS — R748 Abnormal levels of other serum enzymes: Secondary | ICD-10-CM | POA: Diagnosis not present

## 2023-04-12 DIAGNOSIS — K760 Fatty (change of) liver, not elsewhere classified: Secondary | ICD-10-CM | POA: Diagnosis not present

## 2023-04-12 DIAGNOSIS — Z9049 Acquired absence of other specified parts of digestive tract: Secondary | ICD-10-CM | POA: Diagnosis not present

## 2023-04-12 DIAGNOSIS — R160 Hepatomegaly, not elsewhere classified: Secondary | ICD-10-CM | POA: Diagnosis not present

## 2023-04-12 MED ORDER — GADOBUTROL 1 MMOL/ML IV SOLN
9.5000 mL | Freq: Once | INTRAVENOUS | Status: AC | PRN
  Administered 2023-04-12: 9.5 mL via INTRAVENOUS

## 2023-04-23 DIAGNOSIS — M25641 Stiffness of right hand, not elsewhere classified: Secondary | ICD-10-CM | POA: Diagnosis not present

## 2023-04-23 DIAGNOSIS — M79641 Pain in right hand: Secondary | ICD-10-CM | POA: Diagnosis not present

## 2023-04-25 ENCOUNTER — Other Ambulatory Visit: Payer: Self-pay | Admitting: Family Medicine

## 2023-04-28 DIAGNOSIS — M25641 Stiffness of right hand, not elsewhere classified: Secondary | ICD-10-CM | POA: Diagnosis not present

## 2023-04-28 DIAGNOSIS — M79641 Pain in right hand: Secondary | ICD-10-CM | POA: Diagnosis not present

## 2023-05-04 ENCOUNTER — Encounter: Payer: Self-pay | Admitting: Family Medicine

## 2023-05-05 DIAGNOSIS — M79641 Pain in right hand: Secondary | ICD-10-CM | POA: Diagnosis not present

## 2023-05-05 DIAGNOSIS — M25641 Stiffness of right hand, not elsewhere classified: Secondary | ICD-10-CM | POA: Diagnosis not present

## 2023-05-07 NOTE — Patient Instructions (Signed)

## 2023-05-07 NOTE — Progress Notes (Unsigned)
   Established Patient Office Visit   Subjective  Patient ID: Latoya Decker, female    DOB: [birth year]  Age: 62 y.o. MRN: [id number]  No chief complaint on file.   She  has a past medical history of Anxiety, Depression, Diverticulosis, Duodenal ulcer (01/05/2010), Duodenitis, Eosinophilic gastroenteritis (JAN 0102), Family hx of colon cancer, Gastritis, Headache(784.0), Helicobacter pylori gastritis, Hypertension, Nondisplaced fracture of distal phalanx of right thumb with routine healing 10/17/17 (11/13/2017), PUD (peptic ulcer disease), S/P colonoscopy (12/27/2003), and S/P endoscopy (12/27/2003).  HPI  ROS    Objective:     There were no vitals taken for this visit. {Vitals History (Optional):23777}  Physical Exam   No results found for any visits on 05/08/23.  The ASCVD Risk score (Arnett DK, et al., 2019) failed to calculate for the following reasons:   The valid total cholesterol range is 130 to 320 mg/dL    Assessment & Plan:  There are no diagnoses linked to this encounter.  No follow-ups on file.   Cruzita Lederer Newman Nip, FNP

## 2023-05-08 ENCOUNTER — Ambulatory Visit: Payer: BC Managed Care – PPO | Admitting: Family Medicine

## 2023-05-08 ENCOUNTER — Telehealth: Payer: Self-pay

## 2023-05-08 ENCOUNTER — Encounter: Payer: Self-pay | Admitting: Family Medicine

## 2023-05-08 VITALS — BP 129/79 | HR 101 | Ht 66.0 in | Wt 204.1 lb

## 2023-05-08 DIAGNOSIS — L0231 Cutaneous abscess of buttock: Secondary | ICD-10-CM | POA: Insufficient documentation

## 2023-05-08 MED ORDER — PREDNISONE 20 MG PO TABS
20.0000 mg | ORAL_TABLET | Freq: Two times a day (BID) | ORAL | 0 refills | Status: AC
Start: 2023-05-08 — End: 2023-05-13

## 2023-05-08 MED ORDER — DOXYCYCLINE HYCLATE 100 MG PO TABS
100.0000 mg | ORAL_TABLET | Freq: Two times a day (BID) | ORAL | 0 refills | Status: AC
Start: 2023-05-08 — End: 2023-05-15

## 2023-05-08 NOTE — Telephone Encounter (Signed)
Pt called wanting to know the results from her MRI that was done in Sept that was resulted on 10/07. Please advise.

## 2023-05-08 NOTE — Assessment & Plan Note (Addendum)
Started Doxycyline 100 mg twice daily x 7 days Prednisone 20 mg twice daily x 5 days Informed pt to discontinue using the new natural soap, as it may have contributed to the issue. Advise to keep the area clean and dry, apply warm compresses to promote drainage, and avoid squeezing or popping the abscess. If drainage occurs, cover the area with a clean bandage and change it regularly. Can visit Urgent care for I & D

## 2023-05-08 NOTE — Telephone Encounter (Signed)
See result note.  

## 2023-05-09 ENCOUNTER — Other Ambulatory Visit: Payer: Self-pay | Admitting: Gastroenterology

## 2023-05-09 DIAGNOSIS — R748 Abnormal levels of other serum enzymes: Secondary | ICD-10-CM

## 2023-05-12 DIAGNOSIS — M25641 Stiffness of right hand, not elsewhere classified: Secondary | ICD-10-CM | POA: Diagnosis not present

## 2023-05-12 DIAGNOSIS — M79641 Pain in right hand: Secondary | ICD-10-CM | POA: Diagnosis not present

## 2023-05-12 DIAGNOSIS — R748 Abnormal levels of other serum enzymes: Secondary | ICD-10-CM | POA: Diagnosis not present

## 2023-05-13 DIAGNOSIS — M1811 Unilateral primary osteoarthritis of first carpometacarpal joint, right hand: Secondary | ICD-10-CM | POA: Diagnosis not present

## 2023-05-19 DIAGNOSIS — M79641 Pain in right hand: Secondary | ICD-10-CM | POA: Diagnosis not present

## 2023-05-19 DIAGNOSIS — M25641 Stiffness of right hand, not elsewhere classified: Secondary | ICD-10-CM | POA: Diagnosis not present

## 2023-05-20 ENCOUNTER — Other Ambulatory Visit: Payer: Self-pay | Admitting: Family Medicine

## 2023-05-21 ENCOUNTER — Ambulatory Visit (INDEPENDENT_AMBULATORY_CARE_PROVIDER_SITE_OTHER): Payer: BC Managed Care – PPO | Admitting: Family Medicine

## 2023-05-21 ENCOUNTER — Other Ambulatory Visit: Payer: Self-pay | Admitting: Family Medicine

## 2023-05-21 ENCOUNTER — Encounter: Payer: Self-pay | Admitting: Family Medicine

## 2023-05-21 VITALS — BP 119/77 | HR 104 | Ht 66.0 in | Wt 199.0 lb

## 2023-05-21 DIAGNOSIS — E1169 Type 2 diabetes mellitus with other specified complication: Secondary | ICD-10-CM | POA: Diagnosis not present

## 2023-05-21 DIAGNOSIS — E559 Vitamin D deficiency, unspecified: Secondary | ICD-10-CM | POA: Diagnosis not present

## 2023-05-21 DIAGNOSIS — I10 Essential (primary) hypertension: Secondary | ICD-10-CM

## 2023-05-21 DIAGNOSIS — E66811 Obesity, class 1: Secondary | ICD-10-CM

## 2023-05-21 DIAGNOSIS — Z0001 Encounter for general adult medical examination with abnormal findings: Secondary | ICD-10-CM | POA: Insufficient documentation

## 2023-05-21 MED ORDER — FLUCONAZOLE 150 MG PO TABS: 150.0000 mg | ORAL_TABLET | Freq: Once | ORAL | 0 refills | Status: AC

## 2023-05-21 MED ORDER — TIRZEPATIDE 10 MG/0.5ML ~~LOC~~ SOAJ: 10.0000 mg | SUBCUTANEOUS | 3 refills | Status: DC

## 2023-05-21 MED ORDER — DOXYCYCLINE HYCLATE 100 MG PO TABS: 100.0000 mg | ORAL_TABLET | Freq: Two times a day (BID) | ORAL | 0 refills | Status: DC

## 2023-05-21 NOTE — Assessment & Plan Note (Signed)
   Patient re-educated about  the importance of commitment to a  minimum of 150 minutes of exercise per week as able.  The importance of healthy food choices with portion control discussed, as well as eating regularly and within a 12 hour window most days. The need to choose "clean , green" food 50 to 75% of the time is discussed, as well as to make water the primary drink and set a goal of 64 ounces water daily.       05/21/2023   11:25 AM 05/08/2023    9:40 AM 03/31/2023    8:23 AM  Weight /BMI  Weight 199 lb 204 lb 1.9 oz 212 lb 9.6 oz  Height 5\' 6"  (1.676 m) 5\' 6"  (1.676 m) 5\' 6"  (1.676 m)  BMI 32.12 kg/m2 32.95 kg/m2 34.31 kg/m2

## 2023-05-21 NOTE — Assessment & Plan Note (Signed)
Controlled, no change in medication DASH diet and commitment to daily physical activity for a minimum of 30 minutes discussed and encouraged, as a part of hypertension management. The importance of attaining a healthy weight is also discussed.     05/21/2023   11:25 AM 05/08/2023    9:40 AM 03/31/2023    8:27 AM 03/31/2023    8:23 AM 03/25/2023   12:26 PM 03/25/2023   11:05 AM 02/04/2023    2:44 PM  BP/Weight  Systolic BP 119 129 142 142 148 149 144  Diastolic BP 77 79 83 72 88 83 82  Wt. (Lbs) 199 204.12  212.6  210.08   BMI 32.12 kg/m2 32.95 kg/m2  34.31 kg/m2  32.9 kg/m2

## 2023-05-21 NOTE — Patient Instructions (Addendum)
F/u in 13 weeks, call if you need me sooner  One week additional of doxycycline is prescribed and fluconazole 1 tab  New higher dose mounjaro 10 mg when next filling on 06/06/2023  Labs today cBC, hBA1c, TSh , Vit d

## 2023-05-21 NOTE — Assessment & Plan Note (Signed)
Annual exam as documented. Counseling done  re healthy lifestyle involving commitment to 150 minutes exercise per week, heart healthy diet, and attaining healthy weight.The importance of adequate sleep also discussed. Changes in health habits are decided on by the patient with goals and time frames  set for achieving them. Immunization and cancer screening needs are specifically addressed at this visit. 

## 2023-05-21 NOTE — Assessment & Plan Note (Signed)
Updated lab needed at/ before next visit.   

## 2023-05-21 NOTE — Assessment & Plan Note (Signed)
Latoya Decker is reminded of the importance of commitment to daily physical activity for 30 minutes or more, as able and the need to limit carbohydrate intake to 30 to 60 grams per meal to help with blood sugar control.   The need to take medication as prescribed, test blood sugar as directed, and to call between visits if there is a concern that blood sugar is uncontrolled is also discussed.   Latoya Decker is reminded of the importance of daily foot exam, annual eye examination, and good blood sugar, blood pressure and cholesterol control.     Latest Ref Rng & Units 03/25/2023   11:48 AM 02/04/2023    2:49 PM 09/19/2022    4:33 PM 06/14/2022    1:59 PM 05/18/2021    4:50 PM  Diabetic Labs  HbA1c 0.0 - 7.0 %  7.4  6.8  6.8  6.2   Micro/Creat Ratio 0 - 29 mg/g creat 10       Chol 100 - 199 mg/dL 409    811    HDL >91 mg/dL 44    40    Calc LDL 0 - 99 mg/dL 54    80    Triglycerides 0 - 149 mg/dL 478    295    Creatinine 0.57 - 1.00 mg/dL 6.21   3.08  6.57  8.46       05/21/2023   11:25 AM 05/08/2023    9:40 AM 03/31/2023    8:27 AM 03/31/2023    8:23 AM 03/25/2023   12:26 PM 03/25/2023   11:05 AM 02/04/2023    2:44 PM  BP/Weight  Systolic BP 119 129 142 142 148 149 144  Diastolic BP 77 79 83 72 88 83 82  Wt. (Lbs) 199 204.12  212.6  210.08   BMI 32.12 kg/m2 32.95 kg/m2  34.31 kg/m2  32.9 kg/m2       02/04/2023    2:00 PM  Foot/eye exam completion dates  Foot Form Completion Done      Updated lab needed at/ before next visit.

## 2023-05-21 NOTE — Progress Notes (Signed)
Latoya Decker     MRN: [id number]      DOB: 27-Oct-1960  Chief Complaint  Patient presents with   Annual Exam    HPI: Patient is in for annual physical exam. Still has small boil on buttock , much improved , requesting addfitonal antibiotic, does not need I/D per pt FBG 140's tolerating mounjaro well Immunization is reviewed , and  is up to date  PE: BP 119/77 (BP Location: Right Arm, Patient Position: Sitting, Cuff Size: Large)   Pulse (!) 104   Ht 5\' 6"  (1.676 m)   Wt 199 lb (90.3 kg)   SpO2 95%   BMI 32.12 kg/m   Pleasant  female, alert and oriented x 3, in no cardio-pulmonary distress. Afebrile. HEENT No facial trauma or asymetry. Sinuses non tender.  Extra occullar muscles intact.. External ears normal, . Neck: supple, no adenopathy,JVD or thyromegaly.No bruits.  Chest: Clear to ascultation bilaterally.No crackles or wheezes. Non tender to palpation  Breast: Not examined, asymptomatic ,mammogram is up to date  Cardiovascular system; Heart sounds normal,  S1 and  S2 ,no S3.  No murmur, or thrill. Apical beat not displaced Peripheral pulses normal.  Abdomen: Soft, non tender, no organomegaly or masses. No bruits. Bowel sounds normal. No guarding, tenderness or rebound.   GU: Asymptomatic, not examined  Musculoskeletal exam: Full ROM of spine, hips , shoulders and knees.Reduced in left wrist No deformity ,swelling or crepitus noted. No muscle wasting or atrophy.   Neurologic: Cranial nerves 2 to 12 intact. Power, tone ,sensation  normal throughout. No disturbance in gait. No tremor.  Skin: Intact, no ulceration, erythema , scaling or rash noted. Pigmentation normal throughout  Psych; Normal mood and affect. Judgement and concentration normal   Assessment & Plan:  Essential hypertension Controlled, no change in medication DASH diet and commitment to daily physical activity for a minimum of 30 minutes discussed and encouraged, as a part  of hypertension management. The importance of attaining a healthy weight is also discussed.     05/21/2023   11:25 AM 05/08/2023    9:40 AM 03/31/2023    8:27 AM 03/31/2023    8:23 AM 03/25/2023   12:26 PM 03/25/2023   11:05 AM 02/04/2023    2:44 PM  BP/Weight  Systolic BP 119 129 142 142 148 149 144  Diastolic BP 77 79 83 72 88 83 82  Wt. (Lbs) 199 204.12  212.6  210.08   BMI 32.12 kg/m2 32.95 kg/m2  34.31 kg/m2  32.9 kg/m2        Obesity (BMI 30.0-34.9)   Patient re-educated about  the importance of commitment to a  minimum of 150 minutes of exercise per week as able.  The importance of healthy food choices with portion control discussed, as well as eating regularly and within a 12 hour window most days. The need to choose "clean , green" food 50 to 75% of the time is discussed, as well as to make water the primary drink and set a goal of 64 ounces water daily.       05/21/2023   11:25 AM 05/08/2023    9:40 AM 03/31/2023    8:23 AM  Weight /BMI  Weight 199 lb 204 lb 1.9 oz 212 lb 9.6 oz  Height 5\' 6"  (1.676 m) 5\' 6"  (1.676 m) 5\' 6"  (1.676 m)  BMI 32.12 kg/m2 32.95 kg/m2 34.31 kg/m2      Vitamin D deficiency Updated lab needed at/ before next visit.  Type 2 diabetes mellitus with other specified complication [facility]) Latoya Decker is reminded of the importance of commitment to daily physical activity for 30 minutes or more, as able and the need to limit carbohydrate intake to 30 to 60 grams per meal to help with blood sugar control.   The need to take medication as prescribed, test blood sugar as directed, and to call between visits if there is a concern that blood sugar is uncontrolled is also discussed.   Latoya Decker is reminded of the importance of daily foot exam, annual eye examination, and good blood sugar, blood pressure and cholesterol control.     Latest Ref Rng & Units 03/25/2023   11:48 AM 02/04/2023    2:49 PM 09/19/2022    4:33 PM 06/14/2022    1:59 PM  05/18/2021    4:50 PM  Diabetic Labs  HbA1c 0.0 - 7.0 %  7.4  6.8  6.8  6.2   Micro/Creat Ratio 0 - 29 mg/g creat 10       Chol 100 - 199 mg/dL 161    096    HDL >04 mg/dL 44    40    Calc LDL 0 - 99 mg/dL 54    80    Triglycerides 0 - 149 mg/dL 540    981    Creatinine 0.57 - 1.00 mg/dL 1.91   4.78  2.95  6.21       05/21/2023   11:25 AM 05/08/2023    9:40 AM 03/31/2023    8:27 AM 03/31/2023    8:23 AM 03/25/2023   12:26 PM 03/25/2023   11:05 AM 02/04/2023    2:44 PM  BP/Weight  Systolic BP 119 129 142 142 148 149 144  Diastolic BP 77 79 83 72 88 83 82  Wt. (Lbs) 199 204.12  212.6  210.08   BMI 32.12 kg/m2 32.95 kg/m2  34.31 kg/m2  32.9 kg/m2       02/04/2023    2:00 PM  Foot/eye exam completion dates  Foot Form Completion Done      Updated lab needed at/ before next visit.   Annual visit for general adult medical examination with abnormal findings Annual exam as documented. Counseling done  re healthy lifestyle involving commitment to 150 minutes exercise per week, heart healthy diet, and attaining healthy weight.The importance of adequate sleep also discussed. . Changes in health habits are decided on by the patient with goals and time frames  set for achieving them. Immunization and cancer screening needs are specifically addressed at this visit.

## 2023-05-22 LAB — CBC
Hematocrit: 45 % (ref 34.0–46.6)
Hemoglobin: 14.6 g/dL (ref 11.1–15.9)
MCH: 29.1 pg (ref 26.6–33.0)
MCHC: 32.4 g/dL (ref 31.5–35.7)
MCV: 90 fL (ref 79–97)
Platelets: 292 10*3/uL (ref 150–450)
RBC: 5.02 x10E6/uL (ref 3.77–5.28)
RDW: 12.8 % (ref 11.7–15.4)
WBC: 8.2 10*3/uL (ref 3.4–10.8)

## 2023-05-22 LAB — TSH: TSH: 0.697 u[IU]/mL (ref 0.450–4.500)

## 2023-05-22 LAB — VITAMIN D 25 HYDROXY (VIT D DEFICIENCY, FRACTURES): Vit D, 25-Hydroxy: 41.3 ng/mL (ref 30.0–100.0)

## 2023-05-22 LAB — HEMOGLOBIN A1C
Est. average glucose Bld gHb Est-mCnc: 151 mg/dL
Hgb A1c MFr Bld: 6.9 % — ABNORMAL HIGH (ref 4.8–5.6)

## 2023-05-26 DIAGNOSIS — M79641 Pain in right hand: Secondary | ICD-10-CM | POA: Diagnosis not present

## 2023-05-26 DIAGNOSIS — M25641 Stiffness of right hand, not elsewhere classified: Secondary | ICD-10-CM | POA: Diagnosis not present

## 2023-05-30 LAB — PRIMARY BILIARY CHOLANGITIS PR
Anti-GP-210 Ab (RDL): <20 U (ref <20)
Anti-Mitochondrial Ab by IFA: <1:20 {titer}
Anti-Mitochondrial M2 Ab (RDL): <20 U (ref <20)
Anti-Nuclear Ab by IFA (RDL): POSITIVE — AB
Anti-SP-100 Ab (RDL): <20 U (ref <20)
Anti-Smooth Muscle Ab by IFA: 1:40 {titer} — ABNORMAL HIGH

## 2023-05-30 LAB — ANA TITER AND PATTERN: Speckled Pattern: 1:160 {titer} — ABNORMAL HIGH

## 2023-05-30 LAB — IGM: IgM (Immunoglobulin M), Srm: 29 mg/dL (ref 26–217)

## 2023-06-02 ENCOUNTER — Other Ambulatory Visit: Payer: Self-pay | Admitting: Family Medicine

## 2023-06-04 ENCOUNTER — Other Ambulatory Visit: Payer: Self-pay | Admitting: *Deleted

## 2023-06-04 DIAGNOSIS — R748 Abnormal levels of other serum enzymes: Secondary | ICD-10-CM

## 2023-06-04 DIAGNOSIS — R768 Other specified abnormal immunological findings in serum: Secondary | ICD-10-CM

## 2023-06-06 DIAGNOSIS — M1811 Unilateral primary osteoarthritis of first carpometacarpal joint, right hand: Secondary | ICD-10-CM | POA: Diagnosis not present

## 2023-06-06 DIAGNOSIS — M25641 Stiffness of right hand, not elsewhere classified: Secondary | ICD-10-CM | POA: Diagnosis not present

## 2023-06-06 DIAGNOSIS — M79641 Pain in right hand: Secondary | ICD-10-CM | POA: Diagnosis not present

## 2023-06-18 ENCOUNTER — Telehealth: Payer: Self-pay | Admitting: Family Medicine

## 2023-06-18 DIAGNOSIS — M79641 Pain in right hand: Secondary | ICD-10-CM | POA: Diagnosis not present

## 2023-06-18 DIAGNOSIS — M25641 Stiffness of right hand, not elsewhere classified: Secondary | ICD-10-CM | POA: Diagnosis not present

## 2023-06-18 NOTE — Telephone Encounter (Signed)
Wellness program exam for work form  Copied Noted Sleeved (put in provider box) Call patient when ready

## 2023-06-19 DIAGNOSIS — H401111 Primary open-angle glaucoma, right eye, mild stage: Secondary | ICD-10-CM | POA: Diagnosis not present

## 2023-06-19 LAB — HM DIABETES EYE EXAM

## 2023-06-19 NOTE — Telephone Encounter (Signed)
Called patient form ready for pick up 

## 2023-06-25 ENCOUNTER — Other Ambulatory Visit: Payer: Self-pay | Admitting: Radiology

## 2023-06-25 DIAGNOSIS — R748 Abnormal levels of other serum enzymes: Secondary | ICD-10-CM

## 2023-06-25 NOTE — Telephone Encounter (Signed)
Patient picked up forms.

## 2023-06-26 ENCOUNTER — Encounter (HOSPITAL_COMMUNITY): Payer: Self-pay

## 2023-06-26 ENCOUNTER — Ambulatory Visit (HOSPITAL_COMMUNITY)
Admission: RE | Admit: 2023-06-26 | Discharge: 2023-06-26 | Disposition: A | Discharge status: Home / Self Care | Payer: BC Managed Care – PPO | Source: Ambulatory Visit | Attending: Gastroenterology | Admitting: Gastroenterology

## 2023-06-26 DIAGNOSIS — Z79899 Other long term (current) drug therapy: Secondary | ICD-10-CM | POA: Insufficient documentation

## 2023-06-26 DIAGNOSIS — K760 Fatty (change of) liver, not elsewhere classified: Secondary | ICD-10-CM | POA: Diagnosis not present

## 2023-06-26 DIAGNOSIS — I10 Essential (primary) hypertension: Secondary | ICD-10-CM | POA: Diagnosis not present

## 2023-06-26 DIAGNOSIS — R748 Abnormal levels of other serum enzymes: Secondary | ICD-10-CM | POA: Diagnosis not present

## 2023-06-26 DIAGNOSIS — R7989 Other specified abnormal findings of blood chemistry: Secondary | ICD-10-CM | POA: Diagnosis not present

## 2023-06-26 DIAGNOSIS — K7581 Nonalcoholic steatohepatitis (NASH): Secondary | ICD-10-CM | POA: Diagnosis not present

## 2023-06-26 DIAGNOSIS — R768 Other specified abnormal immunological findings in serum: Secondary | ICD-10-CM | POA: Diagnosis not present

## 2023-06-26 LAB — CBC
HCT: 42.7 % (ref 36.0–46.0)
Hemoglobin: 14 g/dL (ref 12.0–15.0)
MCH: 29.3 pg (ref 26.0–34.0)
MCHC: 32.8 g/dL (ref 30.0–36.0)
MCV: 89.3 fL (ref 80.0–100.0)
Platelets: 319 10*3/uL (ref 150–400)
RBC: 4.78 MIL/uL (ref 3.87–5.11)
RDW: 12.5 % (ref 11.5–15.5)
WBC: 8.2 10*3/uL (ref 4.0–10.5)
nRBC: 0 % (ref 0.0–0.2)

## 2023-06-26 LAB — GLUCOSE, CAPILLARY: Glucose-Capillary: 96 mg/dL (ref 70–99)

## 2023-06-26 LAB — PROTIME-INR
INR: 1 (ref 0.8–1.2)
Prothrombin Time: 13 s (ref 11.4–15.2)

## 2023-06-26 MED ORDER — BUPIVACAINE HCL (PF) 0.25 % IJ SOLN
10.0000 mL | Freq: Once | INTRAMUSCULAR | Status: AC
  Administered 2023-06-26: 10 mL
  Filled 2023-06-26: qty 10

## 2023-06-26 MED ORDER — BUPIVACAINE HCL 0.25 % IJ SOLN
10.0000 mL | Freq: Once | INTRAMUSCULAR | Status: DC
  Filled 2023-06-26 (×2): qty 10

## 2023-06-26 MED ORDER — MIDAZOLAM HCL 2 MG/2ML IJ SOLN
INTRAMUSCULAR | Status: AC | PRN
  Administered 2023-06-26: 1 mg via INTRAVENOUS
  Administered 2023-06-26: .5 mg via INTRAVENOUS

## 2023-06-26 MED ORDER — MIDAZOLAM HCL 2 MG/2ML IJ SOLN
INTRAMUSCULAR | Status: AC
  Filled 2023-06-26: qty 2

## 2023-06-26 MED ORDER — FENTANYL CITRATE (PF) 100 MCG/2ML IJ SOLN
INTRAMUSCULAR | Status: AC
  Filled 2023-06-26: qty 2

## 2023-06-26 MED ORDER — FENTANYL CITRATE (PF) 100 MCG/2ML IJ SOLN
INTRAMUSCULAR | Status: AC | PRN
  Administered 2023-06-26: 50 ug via INTRAVENOUS

## 2023-06-26 MED ORDER — SODIUM CHLORIDE 0.9% FLUSH
10.0000 mL | Freq: Two times a day (BID) | INTRAVENOUS | Status: DC
  Administered 2023-06-26: 10 mL via INTRAVENOUS

## 2023-06-26 NOTE — H&P (Signed)
Chief Complaint: Patient was seen in consultation today for liver core biopsy at the request of Lewis,Leslie S  Referring Physician(s): Tiffany Kocher  Supervising Physician: Gilmer Mor  Patient Status: [facility] - Out-pt  History of Present Illness: Latoya Decker is a 62 y.o. female   FULL Code status per pt Elevated alkaline phosphatase Known since 2019 per chart CT A/P with contrast 04/2018 showed mild hepatic steatosis with no biliary dilation.   Per Luretha Rued PA- GI: ultrasound guided liver biopy for elevated alkaline phosphatase, positive ANA, anti-smooth muscle antibody, need to rule out autoimmune hepatitis, primary biliary cholangitis   Past Medical History:  Diagnosis Date   Anxiety    Depression    Diverticulosis    Duodenal ulcer 01/05/2010   EGD Dr Steffanie Dunn, h pylori gastritis, completed treatment   Duodenitis    Eosinophilic gastroenteritis JAN 2015   DEC 2014: NL CMP/GIARDIA Ag   Family hx of colon cancer    Mother & sister   Gastritis    hospitalized 6/23-6/25 2011    Headache(784.0)    migraines   Helicobacter pylori gastritis    Hypertension    Nondisplaced fracture of distal phalanx of right thumb with routine healing 10/17/17 11/13/2017   PUD (peptic ulcer disease)    Dr Katrinka Blazing   S/P colonoscopy 12/27/2003   Dr Casimiro Needle   S/P endoscopy 12/27/2003   Dr Rosita Fire superficial ulcers GEJ, duodenitis,gastritis    Past Surgical History:  Procedure Laterality Date   ABDOMINAL HYSTERECTOMY     bilateral tubal ligation  1989   BIOPSY  06/10/2011   SLF: mild gastritis, bx benign, sb bx benign    CHOLECYSTECTOMY  1997   aph-Smith   COLONOSCOPY  05/2011   SLF: multiple hyperplastic sessile polyps, scattered diverticulosis. next TCS 05/2016 ([facility] CRC)   COLONOSCOPY N/A 04/21/2017   Dr. Darrick Penna: two 2-59mm colon polyps removed, diverticulosis, hemorrhoids. next TCS in 5 years.    COLONOSCOPY WITH PROPOFOL N/A 05/21/2022   Procedure: COLONOSCOPY WITH  PROPOFOL;  Surgeon: Lanelle Bal, DO;  Location: [facility];  Service: Endoscopy;  Laterality: N/A;  11:00 am   ESOPHAGOGASTRODUODENOSCOPY N/A 07/28/2013   EOS INOPHILIC GASTROENTERITIS   ESOPHAGOGASTRODUODENOSCOPY N/A 04/21/2017   Dr. Darrick Penna: mild gastritis with H.pylori present, esophageal biopsies negative.    MOLE REMOVAL  09/09/2022   PARTIAL HYSTERECTOMY  2007   1 ovary removed   POLYPECTOMY  06/10/2011   Procedure: POLYPECTOMY;  Surgeon: Arlyce Harman, MD;  Location: [facility];  Service: Endoscopy;;  Cecal polypectomy; Sigmoid colon polypectomies   POLYPECTOMY  05/21/2022   Procedure: POLYPECTOMY INTESTINAL;  Surgeon: Lanelle Bal, DO;  Location: [facility];  Service: Endoscopy;;   TUBAL LIGATION      Allergies: Codeine, Hydrocodone, and Lidocaine viscous hcl  Medications: Prior to Admission medications   Medication Sig Start Date End Date Taking? Authorizing Provider  amLODipine (NORVASC) 5 MG tablet TAKE 1 TABLET BY MOUTH EVERY DAY 03/28/23  Yes Kerri Perches, MD  olmesartan (BENICAR) 40 MG tablet Take 1 tablet (40 mg total) by mouth daily. 04/02/23  Yes Kerri Perches, MD  rosuvastatin (CRESTOR) 5 MG tablet Take one tablet by mouth every Monday, Wednesday, Friday 02/04/23  Yes Kerri Perches, MD  Vitamin D, Ergocalciferol, (DRISDOL) 1.25 MG (50000 UNIT) CAPS capsule Take 1 capsule (50,000 Units total) by mouth every 7 (seven) days. 03/26/23  Yes Kerri Perches, MD  azelastine (ASTELIN) 0.1 % nasal spray Place 1  spray into both nostrils 2 (two) times daily as needed for rhinitis or allergies. Use in each nostril as directed 06/24/22   Birder Robson, MD  Blood Glucose Monitoring Suppl DEVI 1 each by Does not apply route in the morning, at noon, and at bedtime. May substitute to any manufacturer covered by patient's insurance. 02/04/23   Kerri Perches, MD  cetirizine (ZYRTEC ALLERGY) 10 MG tablet Take 1 tablet (10 mg total) by mouth daily as  needed for allergies or rhinitis. 06/24/22   Birder Robson, MD  doxycycline (VIBRA-TABS) 100 MG tablet Take 1 tablet (100 mg total) by mouth 2 (two) times daily. 05/21/23   Kerri Perches, MD  Lancets (ONETOUCH DELICA PLUS LANCET33G) MISC USE IN THE MORNING, AT NOON, AND AT BEDTIME. MAY SUBSTITUTE 05/20/23   Kerri Perches, MD  meloxicam (MOBIC) 15 MG tablet TAKE ONE TABLET BY MOUT ONCE DAILY, AS NEEDED, FOR PAIN 05/21/23   Kerri Perches, MD  olmesartan (BENICAR) 20 MG tablet Take 20 mg by mouth daily.    [provider]  omeprazole (PRILOSEC) 20 MG capsule Take 1 capsule (20 mg total) by mouth daily as needed. 03/31/23   Tiffany Kocher, PA-C  oxyCODONE-acetaminophen (PERCOCET/ROXICET) 5-325 MG tablet Take 1-2 tablets by mouth every 6 (six) hours as needed. 03/13/23   [provider]  tirzepatide [facility] Keen) 10 MG/0.5ML Pen Inject 10 mg into the skin once a week. 06/09/23   Kerri Perches, MD  tirzepatide [facility]) 7.5 MG/0.5ML Pen INJECT 7.5 MG UNDER THE SKIN WEEKLY 06/02/23   Kerri Perches, MD     Family History  Problem Relation Age of Onset   Colon cancer Mother 8   Diabetes Mother    Hypertension Mother    Stroke Mother    Heart attack Father    Diabetes Father    Hypertension Father    Colon cancer Sister 11   Lupus Brother    Parkinsonism Brother    Anesthesia problems Neg Hx    Hypotension Neg Hx    Malignant hyperthermia Neg Hx    Pseudochol deficiency Neg Hx     Social History   Socioeconomic History   Marital status: Married    Spouse name: Jillyn Hidden   Number of children: 3   Years of education: College   Highest education level: Not on file  Occupational History   Occupation: Dialysis Tech    Employer: [employer]    Employer: [employer]  Tobacco Use   Smoking status: Never    Passive exposure: Past   Smokeless tobacco: Never   Tobacco comments:    Never smoker  Vaping Use   Vaping status: Never Used  Substance  and Sexual Activity   Alcohol use: No   Drug use: No   Sexual activity: Yes    Partners: Male    Birth control/protection: Surgical  Other Topics Concern   Not on file  Social History Narrative   Patient is married Jillyn Hidden) and lives at home with her husband and her granddaughter.   Patient has three adult children.   Patient works full-time.   Patient has a college education.   Patient is right-handed.   Patient drinks one cup of tea daily.   Social Drivers of Corporate investment banker Strain: Not on file  Food Insecurity: Low Risk  (05/13/2023)   Received from [facility]   Hunger Vital Sign    Worried About Running Out of Food in  the Last Year: Never true    Ran Out of Food in the Last Year: Never true  Transportation Needs: No Transportation Needs (05/13/2023)   Received from [employer]    In the past 12 months, has lack of reliable transportation kept you from medical appointments, meetings, work or from getting things needed for daily living? : No  Physical Activity: Not on file  Stress: Not on file  Social Connections: Not on file    Review of Systems: A 12 point ROS discussed and pertinent positives are indicated in the HPI above.  All other systems are negative.  Review of Systems  Constitutional:  Negative for activity change, fatigue and fever.  Respiratory:  Negative for cough and shortness of breath.   Cardiovascular:  Negative for chest pain.  Gastrointestinal:  Negative for abdominal pain and nausea.  Neurological:  Negative for weakness.  Psychiatric/Behavioral:  Negative for behavioral problems and confusion.     Vital Signs: Ht 5\' 6"  (1.676 m)   Wt 190 lb (86.2 kg)   BMI 30.67 kg/m     Physical Exam Vitals reviewed.  HENT:     Mouth/Throat:     Mouth: Mucous membranes are moist.  Cardiovascular:     Rate and Rhythm: Normal rate and regular rhythm.     Heart sounds: Normal heart sounds.  Pulmonary:     Effort:  Pulmonary effort is normal.     Breath sounds: Normal breath sounds. No wheezing.  Abdominal:     Palpations: Abdomen is soft.  Musculoskeletal:        General: Normal range of motion.  Skin:    General: Skin is warm.  Neurological:     Mental Status: She is alert and oriented to person, place, and time.  Psychiatric:        Behavior: Behavior normal.     Imaging: No results found.  Labs:  CBC: Recent Labs    05/21/23 1316  WBC 8.2  HGB 14.6  HCT 45.0  PLT 292    COAGS: No results for input(s): "INR", "APTT" in the last 8760 hours.  BMP: Recent Labs    09/19/22 1633 03/25/23 1148  NA 143 144  K 4.2 4.4  CL 106 107*  CO2 21 20  GLUCOSE 104* 118*  BUN 14 12  CALCIUM 9.3 9.3  CREATININE 0.90 0.90    LIVER FUNCTION TESTS: Recent Labs    03/25/23 1148 03/31/23 0952  BILITOT 0.3 0.4  AST 20 19  ALT 19 20  ALKPHOS 191* 193*  PROT 7.2 7.0  ALBUMIN 4.5 4.6    TUMOR MARKERS: No results for input(s): "AFPTM", "CEA", "CA199", "CHROMGRNA" in the last 8760 hours.  Assessment and Plan:  Scheduled today for random liver biopsy in IR Risks and benefits of random  was discussed with the patient and/or patient's family including, but not limited to bleeding, infection, damage to adjacent structures or low yield requiring additional tests.  All of the questions were answered and there is agreement to proceed.  Consent signed and in chart.  Thank you for this interesting consult.  I greatly enjoyed meeting Latoya Decker and look forward to participating in their care.  A copy of this report was sent to the requesting provider on this date.  Electronically Signed: Robet Leu, PA-C 06/26/2023, 12:24 PM   I spent a total of   30 minutes  in face to face in clinical consultation, greater than 50% of which  was counseling/coordinating care for random liver biopsy

## 2023-06-26 NOTE — Procedures (Signed)
Pre Procedure Dx: Elevated LFTs °Post Procedural Dx: Same ° °Technically successful US guided biopsy of right lobe of the liver. ° °EBL: Trace ° °No immediate complications.  ° °Latoya Silvanna Ohmer, MD °Pager #: [phone] ° ° °

## 2023-06-27 LAB — SURGICAL PATHOLOGY

## 2023-06-29 ENCOUNTER — Other Ambulatory Visit: Payer: Self-pay | Admitting: Family Medicine

## 2023-07-02 DIAGNOSIS — M79641 Pain in right hand: Secondary | ICD-10-CM | POA: Diagnosis not present

## 2023-07-02 DIAGNOSIS — M25641 Stiffness of right hand, not elsewhere classified: Secondary | ICD-10-CM | POA: Diagnosis not present

## 2023-07-15 DIAGNOSIS — M1811 Unilateral primary osteoarthritis of first carpometacarpal joint, right hand: Secondary | ICD-10-CM | POA: Diagnosis not present

## 2023-07-15 DIAGNOSIS — M25641 Stiffness of right hand, not elsewhere classified: Secondary | ICD-10-CM | POA: Diagnosis not present

## 2023-07-15 DIAGNOSIS — M79641 Pain in right hand: Secondary | ICD-10-CM | POA: Diagnosis not present

## 2023-07-17 ENCOUNTER — Other Ambulatory Visit: Payer: Self-pay | Admitting: Family Medicine

## 2023-07-18 ENCOUNTER — Other Ambulatory Visit: Payer: Self-pay | Admitting: Family Medicine

## 2023-07-21 ENCOUNTER — Other Ambulatory Visit: Payer: Self-pay | Admitting: Family Medicine

## 2023-07-21 DIAGNOSIS — H34832 Tributary (branch) retinal vein occlusion, left eye, with macular edema: Secondary | ICD-10-CM | POA: Diagnosis not present

## 2023-07-25 ENCOUNTER — Ambulatory Visit (HOSPITAL_COMMUNITY): Payer: BC Managed Care – PPO

## 2023-07-31 DIAGNOSIS — M79641 Pain in right hand: Secondary | ICD-10-CM | POA: Diagnosis not present

## 2023-07-31 DIAGNOSIS — M25641 Stiffness of right hand, not elsewhere classified: Secondary | ICD-10-CM | POA: Diagnosis not present

## 2023-08-01 ENCOUNTER — Ambulatory Visit (HOSPITAL_COMMUNITY)
Admission: RE | Admit: 2023-08-01 | Discharge: 2023-08-01 | Disposition: A | Discharge status: Home / Self Care | Payer: BC Managed Care – PPO | Source: Ambulatory Visit | Attending: Family Medicine | Admitting: Family Medicine

## 2023-08-01 ENCOUNTER — Encounter (HOSPITAL_COMMUNITY): Payer: Self-pay

## 2023-08-01 DIAGNOSIS — Z1231 Encounter for screening mammogram for malignant neoplasm of breast: Secondary | ICD-10-CM | POA: Insufficient documentation

## 2023-08-12 ENCOUNTER — Encounter: Payer: Self-pay | Admitting: Family Medicine

## 2023-08-14 ENCOUNTER — Ambulatory Visit: Payer: BC Managed Care – PPO | Admitting: Internal Medicine

## 2023-08-14 ENCOUNTER — Encounter: Payer: Self-pay | Admitting: Internal Medicine

## 2023-08-14 ENCOUNTER — Other Ambulatory Visit: Payer: Self-pay | Admitting: Internal Medicine

## 2023-08-14 VITALS — BP 119/74 | HR 82 | Temp 98.6°F | Ht 66.0 in | Wt 198.2 lb

## 2023-08-14 DIAGNOSIS — K7581 Nonalcoholic steatohepatitis (NASH): Secondary | ICD-10-CM

## 2023-08-14 DIAGNOSIS — K219 Gastro-esophageal reflux disease without esophagitis: Secondary | ICD-10-CM

## 2023-08-14 DIAGNOSIS — K5900 Constipation, unspecified: Secondary | ICD-10-CM

## 2023-08-14 DIAGNOSIS — K5909 Other constipation: Secondary | ICD-10-CM

## 2023-08-14 MED ORDER — DEXLANSOPRAZOLE 60 MG PO CPDR: 60.0000 mg | DELAYED_RELEASE_CAPSULE | Freq: Every day | ORAL | 11 refills | Status: DC

## 2023-08-14 NOTE — Patient Instructions (Signed)
For your chronic reflux, going to start you on dexlansoprazole 60 mg daily and have sent this to your pharmacy.  Continue stool softeners as needed for your chronic constipation.  Your liver biopsy showed evidence of fatty liver disease.  Fortunately no fibrosis/scarring.  Recommend 1-2# weight loss per week until ideal body weight through exercise & diet. Low fat/cholesterol diet.   Avoid sweets, sodas, fruit juices, sweetened beverages like tea, etc. Gradually increase exercise from 15 min daily up to 1 hr per day 5 days/week. Limit alcohol use.  Continue to keep your diabetes, high blood pressure, dyslipidemia under good control.  GLP-1's are great medications for fatty liver.  Hopefully insurance will allow you to get back on this medication.  Congratulations on your weight loss that is fantastic.  Follow-up in 6 months or sooner if needed.  Dr. Marletta Lor

## 2023-08-14 NOTE — Progress Notes (Signed)
Referring Provider: Kerri Perches, MD Primary Care Physician:  Kerri Perches, MD Primary GI:  Dr. Marletta Lor  Chief Complaint  Patient presents with   Follow-up    Follow up on liver biopsy    HPI:   Latoya Decker is a 63 y.o. female who presents to clinic today for follow-up visit.  MASH, chronically elevated alkaline phosphatase: Extensive workup prior.  GGT 62, AMA negative, AP isoenzymes liver 59%, bone 41%, ANA positive, smooth muscle antibody positive.  MRI/MRCP 04/12/2023 showed hepatomegaly and hepatic steatosis, status post cholecystectomy, no biliary ductal dilatation.  Cardiomegaly.  Liver biopsy 06/26/2023 with minimally active steatohepatitis without fibrosis.  Risk factors for MASH include obesity, dyslipidemia, diabetes, hypertension.  Currently on a GLP-1 and has lost 18 pounds.  Unfortunately she went for refills and insurance no longer covering.  States she has reached out to her PCP in this regard.  No alcohol use.  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  Chronic GERD: Uncontrolled, previously trialed and failed omeprazole, states she was on dexlansoprazole at 1 point which helped the most.  Wishes to go back on this today.  Chronic constipation: Take stool softeners as needed  Past Medical History:  Diagnosis Date   Anxiety    Depression    Diverticulosis    Duodenal ulcer 01/05/2010   EGD Dr Steffanie Dunn, h pylori gastritis, completed treatment   Duodenitis    Eosinophilic gastroenteritis JAN 2015   DEC 2014: NL CMP/GIARDIA Ag   Family hx of colon cancer    Mother & sister   Gastritis    hospitalized 6/23-6/25 2011    Headache(784.0)    migraines   Helicobacter pylori gastritis    Hypertension    Nondisplaced fracture of distal phalanx of right thumb with routine healing 10/17/17 11/13/2017   PUD (peptic ulcer disease)    Dr Katrinka Blazing   S/P colonoscopy 12/27/2003   Dr  Casimiro Needle   S/P endoscopy 12/27/2003   Dr Rosita Fire superficial ulcers GEJ, duodenitis,gastritis    Past Surgical History:  Procedure Laterality Date   ABDOMINAL HYSTERECTOMY     bilateral tubal ligation  1989   BIOPSY  06/10/2011   SLF: mild gastritis, bx benign, sb bx benign    CHOLECYSTECTOMY  1997   aph-Smith   COLONOSCOPY  05/2011   SLF: multiple hyperplastic sessile polyps, scattered diverticulosis. next TCS 05/2016 ([facility] CRC)   COLONOSCOPY N/A 04/21/2017   Dr. Darrick Penna: two 2-47mm colon polyps removed, diverticulosis, hemorrhoids. next TCS in 5 years.    COLONOSCOPY WITH PROPOFOL N/A 05/21/2022   Procedure: COLONOSCOPY WITH PROPOFOL;  Surgeon: Lanelle Bal, DO;  Location: [facility];  Service: Endoscopy;  Laterality: N/A;  11:00 am   ESOPHAGOGASTRODUODENOSCOPY N/A 07/28/2013   EOS INOPHILIC GASTROENTERITIS   ESOPHAGOGASTRODUODENOSCOPY N/A 04/21/2017   Dr. Darrick Penna: mild gastritis with H.pylori present, esophageal biopsies negative.    MOLE REMOVAL  09/09/2022   PARTIAL HYSTERECTOMY  2007   1 ovary removed   POLYPECTOMY  06/10/2011   Procedure: POLYPECTOMY;  Surgeon: Arlyce Harman, MD;  Location: [facility];  Service: Endoscopy;;  Cecal polypectomy; Sigmoid colon polypectomies   POLYPECTOMY  05/21/2022   Procedure: POLYPECTOMY INTESTINAL;  Surgeon: Lanelle Bal, DO;  Location: [facility];  Service: Endoscopy;;   TUBAL LIGATION      Current Outpatient Medications  Medication Sig Dispense Refill   amLODipine (NORVASC) 5 MG tablet TAKE  1 TABLET BY MOUTH EVERY DAY 90 tablet 1   Blood Glucose Monitoring Suppl DEVI 1 each by Does not apply route in the morning, at noon, and at bedtime. May substitute to any manufacturer covered by patient's insurance. 1 each 0   cetirizine (ZYRTEC ALLERGY) 10 MG tablet Take 1 tablet (10 mg total) by mouth daily as needed for allergies or rhinitis. 30 tablet 5   dexlansoprazole (DEXILANT) 60 MG capsule Take 1 capsule (60 mg total) by mouth  daily. 30 capsule 11   Lancets (ONETOUCH DELICA PLUS LANCET33G) MISC USE IN THE MORNING, AT NOON, AND AT BEDTIME. MAY SUBSTITUTE 100 each 1   olmesartan (BENICAR) 20 MG tablet Take 20 mg by mouth daily.     olmesartan (BENICAR) 40 MG tablet TAKE 1 TABLET BY MOUTH EVERY DAY 90 tablet 1   rosuvastatin (CRESTOR) 5 MG tablet TAKE 1 TABLET BY MOUTH EVERY MONDAY, WEDNESDAY AND FRIDAY 36 tablet 1   Vitamin D, Ergocalciferol, (DRISDOL) 1.25 MG (50000 UNIT) CAPS capsule Take 1 capsule (50,000 Units total) by mouth every 7 (seven) days. 5 capsule 5   tirzepatide (MOUNJARO) 10 MG/0.5ML Pen Inject 10 mg into the skin once a week. (Patient not taking: Reported on 08/14/2023) 2 mL 3   tirzepatide (MOUNJARO) 7.5 MG/0.5ML Pen INJECT 7.5 MG UNDER THE SKIN WEEKLY (Patient not taking: Reported on 08/14/2023) 2 mL 1   No current facility-administered medications for this visit.    Allergies as of 08/14/2023 - Review Complete 08/14/2023  Allergen Reaction Noted   Codeine Nausea And Vomiting 06/30/2008   Hydrocodone Nausea And Vomiting 01/12/2010   Lidocaine viscous hcl  08/25/2013    Family History  Problem Relation Age of Onset   Colon cancer Mother 74   Diabetes Mother    Hypertension Mother    Stroke Mother    Heart attack Father    Diabetes Father    Hypertension Father    Colon cancer Sister 59   Lupus Brother    Parkinsonism Brother    Anesthesia problems Neg Hx    Hypotension Neg Hx    Malignant hyperthermia Neg Hx    Pseudochol deficiency Neg Hx     Social History   Socioeconomic History   Marital status: Married    Spouse name: Jillyn Hidden   Number of children: 3   Years of education: [employer] education level: Not on file  Occupational History   Occupation: Dialysis Tech    Employer: [employer]    Employer: [employer]  Tobacco Use   Smoking status: Never    Passive exposure: Past   Smokeless tobacco: Never   Tobacco comments:    Never smoker  Vaping Use   Vaping  status: Never Used  Substance and Sexual Activity   Alcohol use: No   Drug use: No   Sexual activity: Yes    Partners: Male    Birth control/protection: Surgical  Other Topics Concern   Not on file  Social History Narrative   Patient is married Jillyn Hidden) and lives at home with her husband and her granddaughter.   Patient has three adult children.   Patient works full-time.   Patient has a college education.   Patient is right-handed.   Patient drinks one cup of tea daily.   Social Drivers of Corporate investment banker Strain: Not on file  Food Insecurity: High Risk (07/15/2023)   Received from [facility]   Hunger Vital Sign    Worried  About Running Out of Food in the Last Year: Often true    Ran Out of Food in the Last Year: Often true  Transportation Needs: No Transportation Needs (07/15/2023)   Received from [facility]    In the past 12 months, has lack of reliable transportation kept you from medical appointments, meetings, work or from getting things needed for daily living? : No  Physical Activity: Not on file  Stress: Not on file  Social Connections: Not on file    Subjective: Review of Systems  Constitutional:  Negative for chills and fever.  HENT:  Negative for congestion and hearing loss.   Eyes:  Negative for blurred vision and double vision.  Respiratory:  Negative for cough and shortness of breath.   Cardiovascular:  Negative for chest pain and palpitations.  Gastrointestinal:  Negative for abdominal pain, blood in stool, constipation, diarrhea, heartburn, melena and vomiting.  Genitourinary:  Negative for dysuria and urgency.  Musculoskeletal:  Negative for joint pain and myalgias.  Skin:  Negative for itching and rash.  Neurological:  Negative for dizziness and headaches.  Psychiatric/Behavioral:  Negative for depression. The patient is not nervous/anxious.      Objective: BP 119/74   Pulse 82   Temp 98.6 F (37 C)   Ht 5\' 6"   (1.676 m)   Wt 198 lb 3.2 oz (89.9 kg)   BMI 31.99 kg/m  Physical Exam Constitutional:      Appearance: Normal appearance.  HENT:     Head: Normocephalic and atraumatic.  Eyes:     Extraocular Movements: Extraocular movements intact.     Conjunctiva/sclera: Conjunctivae normal.  Cardiovascular:     Rate and Rhythm: Normal rate and regular rhythm.  Pulmonary:     Effort: Pulmonary effort is normal.     Breath sounds: Normal breath sounds.  Abdominal:     General: Bowel sounds are normal.     Palpations: Abdomen is soft.  Musculoskeletal:        General: No swelling. Normal range of motion.     Cervical back: Normal range of motion and neck supple.  Skin:    General: Skin is warm and dry.     Coloration: Skin is not jaundiced.  Neurological:     General: No focal deficit present.     Mental Status: She is alert and oriented to person, place, and time.  Psychiatric:        Mood and Affect: Mood normal.        Behavior: Behavior normal.      Assessment/Plan:  1.  Metabolic dysfunction associated steatohepatitis (MASH)-  Fibrosis 4 Score = .83 (Low risk) Liver biopsy 06/26/2023 with minimally active steatohepatitis without fibrosis.  Counseled in depth on keeping hypertension, dyslipidemia, diabetes, under good control.  Patient has lost 18 pounds on GLP-1.  Unfortunately her insurance no longer covering.  Recommend she discuss further with Dr. Lodema Hong. I reviewed with patient that there is data indicating a benefit of using GLP-1 inhibitors in the setting of fatty liver disease.  Recommend 1-2# weight loss per week until ideal body weight through exercise & diet. Low fat/cholesterol diet.   Avoid sweets, sodas, fruit juices, sweetened beverages like tea, etc. Gradually increase exercise from 15 min daily up to 1 hr per day 5 days/week. Limit alcohol use.  2.  Chronic GERD-uncontrolled.  States previously she was on dexlansoprazole which helped.  Will refill today.  3.   Chronic constipation-continue stool softeners as needed.  Follow-up in 6 months or sooner if needed.   08/14/2023 4:29 PM   Disclaimer: This note was dictated with voice recognition software. Similar sounding words can inadvertently be transcribed and may not be corrected upon review.

## 2023-08-15 NOTE — Telephone Encounter (Signed)
 Ok will do.

## 2023-08-15 NOTE — Telephone Encounter (Signed)
Yes please send another prescription. Latoya Decker

## 2023-08-15 NOTE — Telephone Encounter (Signed)
Hi Latoya Decker,       I looked into your insurance and a prior authorization is not required for your Dexlansoprazole so therefore you would have to pay out of pocket for it. My question is do you want Dr Marletta Lor to send in a different prescription for you?     Thank you, Heloise Beecham, CMA

## 2023-08-20 ENCOUNTER — Encounter: Payer: Self-pay | Admitting: Family Medicine

## 2023-08-20 ENCOUNTER — Ambulatory Visit (INDEPENDENT_AMBULATORY_CARE_PROVIDER_SITE_OTHER): Payer: BC Managed Care – PPO | Admitting: Family Medicine

## 2023-08-20 VITALS — BP 134/78 | HR 98 | Ht 66.0 in | Wt 197.1 lb

## 2023-08-20 DIAGNOSIS — E785 Hyperlipidemia, unspecified: Secondary | ICD-10-CM

## 2023-08-20 DIAGNOSIS — E66811 Obesity, class 1: Secondary | ICD-10-CM | POA: Diagnosis not present

## 2023-08-20 DIAGNOSIS — Z13220 Encounter for screening for lipoid disorders: Secondary | ICD-10-CM

## 2023-08-20 DIAGNOSIS — E1169 Type 2 diabetes mellitus with other specified complication: Secondary | ICD-10-CM

## 2023-08-20 DIAGNOSIS — I10 Essential (primary) hypertension: Secondary | ICD-10-CM | POA: Diagnosis not present

## 2023-08-20 MED ORDER — SEMAGLUTIDE(0.25 OR 0.5MG/DOS) 2 MG/3ML ~~LOC~~ SOPN: 0.5000 mg | PEN_INJECTOR | SUBCUTANEOUS | 3 refills | Status: DC

## 2023-08-20 NOTE — Patient Instructions (Addendum)
 F//U mid to  end June, call if you need me sooner   Fasting lipid, cmp ad eGFr and hBA1Cfirst week in March  Please call  your ins re mounjaro  and alternative covered , Yes you ARE a diabetic. Send me a message directly as soon as you know pls , I am sending you a message today that you will  be able tp reply to  Pneumonia 20 in office today if you have not had this, we are checking on this  It is important that you exercise regularly at least 30 minutes 5 times a week. If you develop chest pain, have severe difficulty breathing, or feel very tired, stop exercising immediately and seek medical attention   Thanks for choosing [facility], we consider it a privelige to serve you.

## 2023-08-25 DIAGNOSIS — M1811 Unilateral primary osteoarthritis of first carpometacarpal joint, right hand: Secondary | ICD-10-CM | POA: Diagnosis not present

## 2023-08-25 NOTE — Progress Notes (Signed)
 Latoya Decker     MRN: [id number]      DOB: Nov 18, 1960  Chief Complaint  Patient presents with   Follow-up    Follow up unable to afford mounjaro     HPI Latoya Decker is here for follow up and re-evaluation of chronic medical conditions, medication management and review of any available recent lab and radiology data.  Preventive health is updated, specifically  Cancer screening and Immunization.   Questions or concerns regarding consultations or procedures which the PT has had in the interim are  addressed. Concern as above Denies polyuria, polydipsia, blurred vision , or hypoglycemic episodes.   ROS Denies recent fever or chills. Denies sinus pressure, nasal congestion, ear pain or sore throat. Denies chest congestion, productive cough or wheezing. Denies chest pains, palpitations and leg swelling Denies abdominal pain, nausea, vomiting,diarrhea or constipation.   Denies dysuria, frequency, hesitancy or incontinence. Denies joint pain, swelling and limitation in mobility. Denies headaches, seizures, numbness, or tingling. Denies depression, anxiety or insomnia. Denies skin break down or rash.   PE  BP 134/78 (BP Location: Right Arm, Patient Position: Sitting, Cuff Size: Large)   Pulse 98   Ht 5' 6 (1.676 m)   Wt 197 lb 1.3 oz (89.4 kg)   SpO2 95%   BMI 31.81 kg/m   Patient alert and oriented and in no cardiopulmonary distress.  HEENT: No facial asymmetry, EOMI,     Neck supple .  Chest: Clear to auscultation bilaterally.  CVS: S1, S2 no murmurs, no S3.Regular rate.  ABD: Soft non tender.   Ext: No edema  MS: Adequate ROM spine, shoulders, hips and knees.  Skin: Intact, no ulcerations or rash noted.  Psych: Good eye contact, normal affect. Memory intact not anxious or depressed appearing.  CNS: CN 2-12 intact, power,  normal throughout.no focal deficits noted.   Assessment & Plan  Essential hypertension Controlled, no change in medication DASH diet  and commitment to daily physical activity for a minimum of 30 minutes discussed and encouraged, as a part of hypertension management. The importance of attaining a healthy weight is also discussed.     08/20/2023    1:24 PM 08/14/2023    3:31 PM 06/26/2023    3:35 PM 06/26/2023    3:20 PM 06/26/2023    3:05 PM 06/26/2023    2:50 PM 06/26/2023    2:25 PM  BP/Weight  Systolic BP 134 119 121 123 124 121 128  Diastolic BP 78 74 80 74 75 67 67  Wt. (Lbs) 197.08 198.2       BMI 31.81 kg/m2 31.99 kg/m2            Hyperlipidemia LDL goal <100 Hyperlipidemia:Low fat diet discussed and encouraged. Controlled, no change in medication Updated lab needed at/ before next visit.    Lipid Panel  Lab Results  Component Value Date   CHOL 122 03/25/2023   HDL 44 03/25/2023   LDLCALC 54 03/25/2023   TRIG 138 03/25/2023   CHOLHDL 2.8 03/25/2023       Type 2 diabetes mellitus with other specified complication (HCC) Diabetes associated with hypertension, hyperlipidemia, and obesity Deteriorated Ozempic  prescribed in [place of mounjaro  due to coverage Updated lab needed at/ before next visit.   [name] is reminded of the importance of commitment to daily physical activity for 30 minutes or more, as able and the need to limit carbohydrate intake to 30 to 60 grams per meal to help with blood sugar control.  The need to take medication as prescribed, test blood sugar as directed, and to call between visits if there is a concern that blood sugar is uncontrolled is also discussed.   [name] is reminded of the importance of daily foot exam, annual eye examination, and good blood sugar, blood pressure and cholesterol control.     Latest Ref Rng & Units 05/21/2023    1:16 PM 03/25/2023   11:48 AM 02/04/2023    2:49 PM 09/19/2022    4:33 PM 06/14/2022    1:59 PM  Diabetic Labs  HbA1c 4.8 - 5.6 % 6.9   7.4  6.8  6.8   Micro/Creat Ratio 0 - 29 mg/g creat  10      Chol 100 - 199 mg/dL   877    856   HDL >60 mg/dL  44    40   Calc LDL 0 - 99 mg/dL  54    80   Triglycerides 0 - 149 mg/dL  861    868   Creatinine 0.57 - 1.00 mg/dL  9.09   9.09  9.02       08/20/2023    1:24 PM 08/14/2023    3:31 PM 06/26/2023    3:35 PM 06/26/2023    3:20 PM 06/26/2023    3:05 PM 06/26/2023    2:50 PM 06/26/2023    2:25 PM  BP/Weight  Systolic BP 134 119 121 123 124 121 128  Diastolic BP 78 74 80 74 75 67 67  Wt. (Lbs) 197.08 198.2       BMI 31.81 kg/m2 31.99 kg/m2           06/19/2023   11:26 AM 02/04/2023    2:00 PM  Foot/eye exam completion dates  Eye Exam --       Foot Form Completion  Done     This result is from an external source.        Obesity (BMI 30.0-34.9)  Patient re-educated about  the importance of commitment to a  minimum of 150 minutes of exercise per week as able.  The importance of healthy food choices with portion control discussed, as well as eating regularly and within a 12 hour window most days. The need to choose clean , green food 50 to 75% of the time is discussed, as well as to make water  the primary drink and set a goal of 64 ounces water  daily.       08/20/2023    1:24 PM 08/14/2023    3:31 PM 06/26/2023   12:22 PM  Weight /BMI  Weight 197 lb 1.3 oz 198 lb 3.2 oz 190 lb  Height 5' 6 (1.676 m) 5' 6 (1.676 m) 5' 6 (1.676 m)  BMI 31.81 kg/m2 31.99 kg/m2 30.67 kg/m2   Deteriorated, needs to work on diet and exercise

## 2023-08-25 NOTE — Assessment & Plan Note (Signed)
 Controlled, no change in medication DASH diet and commitment to daily physical activity for a minimum of 30 minutes discussed and encouraged, as a part of hypertension management. The importance of attaining a healthy weight is also discussed.     08/20/2023    1:24 PM 08/14/2023    3:31 PM 06/26/2023    3:35 PM 06/26/2023    3:20 PM 06/26/2023    3:05 PM 06/26/2023    2:50 PM 06/26/2023    2:25 PM  BP/Weight  Systolic BP 134 119 121 123 124 121 128  Diastolic BP 78 74 80 74 75 67 67  Wt. (Lbs) 197.08 198.2       BMI 31.81 kg/m2 31.99 kg/m2

## 2023-08-25 NOTE — Assessment & Plan Note (Addendum)
 Diabetes associated with hypertension, hyperlipidemia, and obesity Deteriorated Ozempic  prescribed in [place of mounjaro  due to coverage Updated lab needed at/ before next visit.   Latoya Decker is reminded of the importance of commitment to daily physical activity for 30 minutes or more, as able and the need to limit carbohydrate intake to 30 to 60 grams per meal to help with blood sugar control.   The need to take medication as prescribed, test blood sugar as directed, and to call between visits if there is a concern that blood sugar is uncontrolled is also discussed.   Latoya Decker is reminded of the importance of daily foot exam, annual eye examination, and good blood sugar, blood pressure and cholesterol control.     Latest Ref Rng & Units 05/21/2023    1:16 PM 03/25/2023   11:48 AM 02/04/2023    2:49 PM 09/19/2022    4:33 PM 06/14/2022    1:59 PM  Diabetic Labs  HbA1c 4.8 - 5.6 % 6.9   7.4  6.8  6.8   Micro/Creat Ratio 0 - 29 mg/g creat  10      Chol 100 - 199 mg/dL  130    865   HDL >78 mg/dL  44    40   Calc LDL 0 - 99 mg/dL  54    80   Triglycerides 0 - 149 mg/dL  469    629   Creatinine 0.57 - 1.00 mg/dL  5.28   4.13  2.44       08/20/2023    1:24 PM 08/14/2023    3:31 PM 06/26/2023    3:35 PM 06/26/2023    3:20 PM 06/26/2023    3:05 PM 06/26/2023    2:50 PM 06/26/2023    2:25 PM  BP/Weight  Systolic BP 134 119 121 123 124 121 128  Diastolic BP 78 74 80 74 75 67 67  Wt. (Lbs) 197.08 198.2       BMI 31.81 kg/m2 31.99 kg/m2           06/19/2023   11:26 AM 02/04/2023    2:00 PM  Foot/eye exam completion dates  Eye Exam --       Foot Form Completion  Done     This result is from an external source.

## 2023-08-25 NOTE — Assessment & Plan Note (Signed)
  Patient re-educated about  the importance of commitment to a  minimum of 150 minutes of exercise per week as able.  The importance of healthy food choices with portion control discussed, as well as eating regularly and within a 12 hour window most days. The need to choose "clean , green" food 50 to 75% of the time is discussed, as well as to make water  the primary drink and set a goal of 64 ounces water  daily.       08/20/2023    1:24 PM 08/14/2023    3:31 PM 06/26/2023   12:22 PM  Weight /BMI  Weight 197 lb 1.3 oz 198 lb 3.2 oz 190 lb  Height 5\' 6"  (1.676 m) 5\' 6"  (1.676 m) 5\' 6"  (1.676 m)  BMI 31.81 kg/m2 31.99 kg/m2 30.67 kg/m2   Deteriorated, needs to work on diet and exercise

## 2023-08-25 NOTE — Assessment & Plan Note (Addendum)
 Hyperlipidemia:Low fat diet discussed and encouraged. Controlled, no change in medication Updated lab needed at/ before next visit.    Lipid Panel  Lab Results  Component Value Date   CHOL 122 03/25/2023   HDL 44 03/25/2023   LDLCALC 54 03/25/2023   TRIG 138 03/25/2023   CHOLHDL 2.8 03/25/2023

## 2023-09-02 ENCOUNTER — Other Ambulatory Visit: Payer: Self-pay | Admitting: Internal Medicine

## 2023-09-02 MED ORDER — PANTOPRAZOLE SODIUM 40 MG PO TBEC: 40.0000 mg | DELAYED_RELEASE_TABLET | Freq: Two times a day (BID) | ORAL | 11 refills | Status: DC

## 2023-09-09 ENCOUNTER — Ambulatory Visit: Payer: Self-pay | Admitting: Family Medicine

## 2023-09-09 NOTE — Telephone Encounter (Signed)
 This RN attempted to contact patient for triage. No answer, voicemail left requesting return call to clinic.

## 2023-09-09 NOTE — Telephone Encounter (Signed)
 Copied From CRM [id number]. Reason for Triage: Patient states she's been having a sore throat and sneezing since Friday.    Chief Complaint: Sore throat  Symptoms: Sore throat, cough, red eyes Frequency: Constant  Pertinent Negatives: Patient denies fever Disposition: [] ED /[] Urgent Care (no appt availability in office) / [x] Appointment(In office/virtual)/ []  [facility]/ [] Home Care/ [] Refused Recommended Disposition /[] [facility]/ []  Follow-up with PCP Additional Notes: Patient reports that for the last 4-5 days she has had  left sided sore throat, cough, and red eyes. She denies any fevers. She states she has been taking OTC medication without relief. Appointment made for the patient on Thursday.     Reason for Disposition  [1] Sore throat with cough/cold symptoms AND [2] present > 5 days  Answer Assessment - Initial Assessment Questions 1. ONSET: "When did the throat start hurting?" (Hours or days ago)      4-5 days  2. SEVERITY: "How bad is the sore throat?" (Scale 1-10; mild, moderate or severe)   - MILD (1-3):  Doesn't interfere with eating or normal activities.   - MODERATE (4-7): Interferes with eating some solids and normal activities.   - SEVERE (8-10):  Excruciating pain, interferes with most normal activities.   - SEVERE WITH DYSPHAGIA (10): Can't swallow liquids, drooling.     8/10 3. STREP EXPOSURE: "Has there been any exposure to strep within the past week?" If Yes, ask: "What type of contact occurred?"      No 4.  VIRAL SYMPTOMS: "Are there any symptoms of a cold, such as a runny nose, cough, hoarse voice or red eyes?"      Cough, red eyes 5. FEVER: "Do you have a fever?" If Yes, ask: "What is your temperature, how was it measured, and when did it start?"     No 6. PUS ON THE TONSILS: "Is there pus on the tonsils in the back of your throat?"     No 7. OTHER SYMPTOMS: "Do you have any other symptoms?" (e.g., difficulty breathing, headache, rash)      No  Protocols used: Sore Throat-A-AH

## 2023-09-10 NOTE — Patient Instructions (Signed)

## 2023-09-10 NOTE — Progress Notes (Unsigned)
   Established Patient Office Visit   Subjective  Patient ID: Latoya Decker, female    DOB: September 16, 1960  Age: 63 y.o. MRN: [id number]  No chief complaint on file.   She  has a past medical history of Anxiety, Depression, Diverticulosis, Duodenal ulcer (01/05/2010), Duodenitis, Eosinophilic gastroenteritis (JAN 4098), Family hx of colon cancer, Gastritis, Headache(784.0), Helicobacter pylori gastritis, Hypertension, Nondisplaced fracture of distal phalanx of right thumb with routine healing 10/17/17 (11/13/2017), PUD (peptic ulcer disease), S/P colonoscopy (12/27/2003), and S/P endoscopy (12/27/2003).  Patient complains of {pneum rfv:14244}. Patient describes symptoms of {pneumonia symptoms:12520}. Symptoms began {0-10:33138} {unit:11::"days"} ago and are {course:17::"unchanged"} since that time. Patient denies {pneumonia denies:14121}. Treatment thus far includes {pneumonia tx to date:14122} Past pulmonary history is significant for {resp history:412}     ROS    Objective:     There were no vitals taken for this visit. {Vitals History (Optional):23777}  Physical Exam   No results found for any visits on 09/11/23.  The ASCVD Risk score (Arnett DK, et al., 2019) failed to calculate for the following reasons:   The valid total cholesterol range is 130 to 320 mg/dL    Assessment & Plan:  There are no diagnoses linked to this encounter.  No follow-ups on file.   Cruzita Lederer Newman Nip, FNP

## 2023-09-11 ENCOUNTER — Encounter: Payer: Self-pay | Admitting: Family Medicine

## 2023-09-11 ENCOUNTER — Ambulatory Visit (INDEPENDENT_AMBULATORY_CARE_PROVIDER_SITE_OTHER): Payer: Self-pay | Admitting: Family Medicine

## 2023-09-11 VITALS — BP 138/84 | HR 97 | Ht 66.0 in | Wt 202.1 lb

## 2023-09-11 DIAGNOSIS — J029 Acute pharyngitis, unspecified: Secondary | ICD-10-CM

## 2023-09-11 DIAGNOSIS — J028 Acute pharyngitis due to other specified organisms: Secondary | ICD-10-CM | POA: Diagnosis not present

## 2023-09-11 DIAGNOSIS — B9689 Other specified bacterial agents as the cause of diseases classified elsewhere: Secondary | ICD-10-CM

## 2023-09-11 MED ORDER — LEVOCETIRIZINE DIHYDROCHLORIDE 5 MG PO TABS: 5.0000 mg | ORAL_TABLET | Freq: Every evening | ORAL | 1 refills | Status: DC

## 2023-09-11 MED ORDER — BENZONATATE 200 MG PO CAPS: 200.0000 mg | ORAL_CAPSULE | Freq: Two times a day (BID) | ORAL | 0 refills | Status: DC | PRN

## 2023-09-11 MED ORDER — AMOXICILLIN-POT CLAVULANATE 875-125 MG PO TABS: 1.0000 | ORAL_TABLET | Freq: Two times a day (BID) | ORAL | 0 refills | Status: AC

## 2023-09-11 NOTE — Addendum Note (Signed)
 Addended by: Burt Ek on: 09/11/2023 09:32 AM   Modules accepted: Orders

## 2023-09-11 NOTE — Assessment & Plan Note (Signed)
 Rapid Strep test ordered   Benzonatate 200 mg PRN, Augmentin 875-125 mg twice daily x 7 days Advise patient to rest to support your body's recovery. Stay hydrated by drinking water, tea, or broth. Using a humidifier can help soothe throat irritation and ease nasal congestion. For fever or pain, acetaminophen (Tylenol) is recommended. To relieve other symptoms, try saline nasal sprays, throat lozenges, or gargling with saltwater. Focus on eating light, healthy meals like fruits and vegetables to keep your strength up. Practice good hygiene by washing your hands frequently and covering your mouth when coughing or sneezing.Follow-up for worsening or persistent symptoms. Patient verbalizes understanding regarding plan of care and all questions answered

## 2023-09-28 ENCOUNTER — Other Ambulatory Visit: Payer: Self-pay | Admitting: Family Medicine

## 2023-10-01 ENCOUNTER — Other Ambulatory Visit: Payer: Self-pay | Admitting: Family Medicine

## 2023-10-02 NOTE — Congregational Nurse Program (Signed)
 Pt was followed up with a  health interview/assessment upon completing their enrollment into the [employer] [facility] Uninsured Program on today (3.20.25) .   Pt states she is currently seeing a PCP by way of [facility] that she relies on for her medical care needs.    She states since she is no longer working and is need of assistance with being able to maintain access to her routine medications and relates to affordability.    Chief Reasons Needed for PCP - To continue to maintain access to medication, ozempic meds refill needed, have been out for 3 weeks.   Socio-determinants health needs identified: Denies SOD[facility] needs during visit, but was introduced to onsite food market at [employer] to tour if needed for future access.     PHQ-9= 11 (MODERATE) stress from recent changes to medical/medication access  Completed During today's Visit -Pt initiated enrollment into the [employer] Connect Uninsured Program on today (3.20.25)  with Ahnycea S. (Care Guide).   In which there is currently a pending eligibility status per pt's return of additional documents needed to complete  the application   Plans  -Care guide Louanna Raw S) will assist the pt with the process of accessing [facility] Financial Assistance Program to assist with medical care/medication access and affordability  -Pt will be followed up with continuous nurse case management thereafter completion of Care Connect enrollment process

## 2023-10-02 NOTE — Congregational Nurse Program (Deleted)
  Dept: [phone]   Congregational Nurse Program Note  Date of Encounter: 10/02/2023  Past Medical History: Past Medical History:  Diagnosis Date   Anxiety    Depression    Diverticulosis    Duodenal ulcer 01/05/2010   EGD Dr Steffanie Dunn, h pylori gastritis, completed treatment   Duodenitis    Eosinophilic gastroenteritis JAN 2015   DEC 2014: NL CMP/GIARDIA Ag   Family hx of colon cancer    Mother & sister   Gastritis    hospitalized 6/23-6/25 2011    Headache(784.0)    migraines   Helicobacter pylori gastritis    Hypertension    Nondisplaced fracture of distal phalanx of right thumb with routine healing 10/17/17 11/13/2017   PUD (peptic ulcer disease)    Dr Katrinka Blazing   S/P colonoscopy 12/27/2003   Dr Casimiro Needle   S/P endoscopy 12/27/2003   Dr Rosita Fire superficial ulcers GEJ, duodenitis,gastritis    Encounter Details:  Community Questionnaire - 10/02/23 1215       Questionnaire   Ask client: Do you give verbal consent for me to treat you today? Yes    Student Assistance N/A    Location Patient Served  [facility]    Encounter Setting CN site    Population Status Unknown    Insurance Uninsured ([facility] Connects/Self-Pay/Medicaid Family Planning)    Insurance/Financial Assistance Referral [employer] Connects    Medication Referred to Medication Assistance    Medical Provider Yes    Screening Referrals Made N/A    Medical Referrals Made N/A    Medical Appointment Completed N/A    CNP Interventions Advocate/Support;Navigate Healthcare System    Screenings CN Performed N/A    ED Visit Averted N/A    Life-Saving Intervention Made N/A

## 2023-10-03 ENCOUNTER — Other Ambulatory Visit: Payer: Self-pay | Admitting: Family Medicine

## 2023-10-23 ENCOUNTER — Telehealth: Payer: Self-pay | Admitting: Family Medicine

## 2023-10-23 NOTE — Telephone Encounter (Signed)
 I spoke with the patient and states she is unclear why [employer] sent disability paperwork here, as she is out because she is unable to use her hand which was operated on last year to do her job. States she has been terminated and tjhatshe jhas been informed that after her disability runs out she will be turned over to  another which may qualify her for medicaid, currently unable to get meds otr labs , no ins Paperwork from [employer] to be sent to ortho since an [employer], states [facility] already knows this and ghas sent it there . Paperwork will be left in "storage area in Nurse's station

## 2023-10-23 NOTE — Telephone Encounter (Signed)
 [facility] [employer]  Noted  Copied Sleeved  Original in PCP box Copy front desk folder

## 2023-10-30 ENCOUNTER — Telehealth: Payer: Self-pay | Admitting: Family Medicine

## 2023-10-30 NOTE — Telephone Encounter (Signed)
 Copied from CRM [id number]. Topic: General - Other >> Oct 30, 2023  2:51 PM Jeris Montes S wrote: Reason for CRM: Joey @ the [facility] called regarding paperwork that was sent over for patient in early April.Aaron AasAaron AasAdvised to resubmit information. He provided his cb and fax information  Cb #[phone] Fax [phone]

## 2023-10-30 NOTE — Telephone Encounter (Signed)
 Pls notify Joey that the disability forms were forwarded to orthopedics as it is an ortho problem. The patient said she is not sure why they were sent here and that it should have been sent to the orthopedic treating her conditions. (Kevin Kuzma at [facility] ortho)

## 2023-10-31 NOTE — Telephone Encounter (Signed)
 Called Joey at [phone] x [phone] left voicemail papers forward to ortho.

## 2023-11-01 ENCOUNTER — Other Ambulatory Visit: Payer: Self-pay | Admitting: Family Medicine

## 2024-01-02 ENCOUNTER — Encounter: Payer: Self-pay | Admitting: Family Medicine

## 2024-01-02 ENCOUNTER — Ambulatory Visit (INDEPENDENT_AMBULATORY_CARE_PROVIDER_SITE_OTHER): Payer: BC Managed Care – PPO | Admitting: Family Medicine

## 2024-01-02 VITALS — BP 160/88 | HR 98 | Resp 16 | Ht 66.0 in | Wt 211.0 lb

## 2024-01-02 DIAGNOSIS — E1169 Type 2 diabetes mellitus with other specified complication: Secondary | ICD-10-CM

## 2024-01-02 DIAGNOSIS — E785 Hyperlipidemia, unspecified: Secondary | ICD-10-CM | POA: Diagnosis not present

## 2024-01-02 DIAGNOSIS — M25511 Pain in right shoulder: Secondary | ICD-10-CM

## 2024-01-02 DIAGNOSIS — I10 Essential (primary) hypertension: Secondary | ICD-10-CM | POA: Diagnosis not present

## 2024-01-02 DIAGNOSIS — M79644 Pain in right finger(s): Secondary | ICD-10-CM | POA: Diagnosis not present

## 2024-01-02 MED ORDER — PREDNISONE 10 MG PO TABS: 10.0000 mg | ORAL_TABLET | Freq: Two times a day (BID) | ORAL | 0 refills | Status: DC

## 2024-01-02 NOTE — Progress Notes (Unsigned)
   Latoya Decker     MRN: [id number]      DOB: [birth year]  Chief Complaint  Patient presents with   Hypertension    Follow up     HPI [name] is here for follow up and re-evaluation of chronic medical conditions, medication management and review of any available recent lab and radiology data.  Preventive health is updated, specifically  Cancer screening and Immunization.   Has only been on one BP med since last visit , hopes to get medicaid starting this week ROS Denies recent fever or chills. Denies sinus pressure, nasal congestion, ear pain or sore throat. Denies chest congestion, productive cough or wheezing. Denies chest pains, palpitations and leg swelling Denies abdominal pain, nausea, vomiting,diarrhea or constipation.   Denies dysuria, frequency, hesitancy or incontinence.  Right hand improving slowly. Denies headaches, seizures, numbness, or tingling. Denies depression, anxiety or insomnia. Denies skin break down or rash.   PE  BP (!) 160/88   Pulse 98   Resp 16   Ht 5' 6 (1.676 m)   Wt 211 lb 0.3 oz (95.7 kg)   SpO2 97%   BMI 34.06 kg/m   Patient alert and oriented and in no cardiopulmonary distress.  HEENT: No facial asymmetry, EOMI,     Neck supple .  Chest: Clear to auscultation bilaterally.  CVS: S1, S2 no murmurs, no S3.Regular rate.  ABD: Soft non tender.   Ext: No edema  MS: Adequate ROM spine, shoulders, hips and knees.  Skin: Intact, no ulcerations or rash noted.  Psych: Good eye contact, normal affect. Memory intact not anxious or depressed appearing.  CNS: CN 2-12 intact, power,  normal throughout.no focal deficits noted.   Assessment & Plan  No problem-specific Assessment & Plan notes found for this encounter.

## 2024-01-02 NOTE — Patient Instructions (Signed)
 F/u in 6 weeks, re eval blood pressure and review labs, call if you need me sooner  5 day course prednisone  is prescribed for right shoulder pain   PLS let usknow as soon as you getinsurance straightened out next week and get the labs as soon as you get it  I will send meds afterl I review labs  It is important that you exercise regularly at least 30 minutes 5 times a week. If you develop chest pain, have severe difficulty breathing, or feel very tired, stop exercising immediately and seek medical attention   Thanks for choosing [facility], we consider it a privelige to serve you.

## 2024-01-04 ENCOUNTER — Encounter: Payer: Self-pay | Admitting: Family Medicine

## 2024-01-04 DIAGNOSIS — M25511 Pain in right shoulder: Secondary | ICD-10-CM | POA: Insufficient documentation

## 2024-01-04 NOTE — Assessment & Plan Note (Signed)
 DASH diet and commitment to daily physical activity for a minimum of 30 minutes discussed and encouraged, as a part of hypertension management. The importance of attaining a healthy weight is also discussed.     01/02/2024    1:09 PM 01/02/2024   12:58 PM 09/11/2023    8:52 AM 08/20/2023    1:24 PM 08/14/2023    3:31 PM 06/26/2023    3:35 PM 06/26/2023    3:20 PM  BP/Weight  Systolic BP 160 160 138 134 119 121 123  Diastolic BP 88 87 84 78 74 80 74  Wt. (Lbs)  211.02 202.08 197.08 198.2    BMI  34.06 kg/m2 32.62 kg/m2 31.81 kg/m2 31.99 kg/m2       Needs to comply with meds hopes to do so in next week, currently has noinsurance

## 2024-01-04 NOTE — Assessment & Plan Note (Signed)
 5 day course of prednisone  prescribed, will likely need Ortho

## 2024-01-04 NOTE — Assessment & Plan Note (Signed)
 Diabetes associated with hypertension, hyperlipidemia, and obesity  Latoya Decker is reminded of the importance of commitment to daily physical activity for 30 minutes or more, as able and the need to limit carbohydrate intake to 30 to 60 grams per meal to help with blood sugar control.   The need to take medication as prescribed, test blood sugar as directed, and to call between visits if there is a concern that blood sugar is uncontrolled is also discussed.   Latoya Decker is reminded of the importance of daily foot exam, annual eye examination, and good blood sugar, blood pressure and cholesterol control.     Latest Ref Rng & Units 05/21/2023    1:16 PM 03/25/2023   11:48 AM 02/04/2023    2:49 PM 09/19/2022    4:33 PM 06/14/2022    1:59 PM  Diabetic Labs  HbA1c 4.8 - 5.6 % 6.9   7.4  6.8  6.8   Micro/Creat Ratio 0 - 29 mg/g creat  10      Chol 100 - 199 mg/dL  877    856   HDL >60 mg/dL  44    40   Calc LDL 0 - 99 mg/dL  54    80   Triglycerides 0 - 149 mg/dL  861    868   Creatinine 0.57 - 1.00 mg/dL  9.09   9.09  9.02       01/02/2024    1:09 PM 01/02/2024   12:58 PM 09/11/2023    8:52 AM 08/20/2023    1:24 PM 08/14/2023    3:31 PM 06/26/2023    3:35 PM 06/26/2023    3:20 PM  BP/Weight  Systolic BP 160 160 138 134 119 121 123  Diastolic BP 88 87 84 78 74 80 74  Wt. (Lbs)  211.02 202.08 197.08 198.2    BMI  34.06 kg/m2 32.62 kg/m2 31.81 kg/m2 31.99 kg/m2        02/04/2023    2:00 PM  Foot/eye exam completion dates  Foot Form Completion Done      Updated lab needed at/ before next visit. On nmo meds currently

## 2024-01-04 NOTE — Assessment & Plan Note (Signed)
 Hyperlipidemia:Low fat diet discussed and encouraged.   Lipid Panel  Lab Results  Component Value Date   CHOL 122 03/25/2023   HDL 44 03/25/2023   LDLCALC 54 03/25/2023   TRIG 138 03/25/2023   CHOLHDL 2.8 03/25/2023     Updated lab needed at/ before next visit.

## 2024-01-04 NOTE — Assessment & Plan Note (Addendum)
 Improved post surgery, however still incapable of repitive use of hand

## 2024-01-05 ENCOUNTER — Encounter: Payer: Self-pay | Admitting: Internal Medicine

## 2024-01-13 ENCOUNTER — Encounter: Payer: Self-pay | Admitting: Family Medicine

## 2024-01-15 ENCOUNTER — Other Ambulatory Visit: Payer: Self-pay | Admitting: Family Medicine

## 2024-01-15 MED ORDER — VITAMIN D (ERGOCALCIFEROL) 1.25 MG (50000 UNIT) PO CAPS: 50000.0000 [IU] | ORAL_CAPSULE | ORAL | 5 refills | Status: AC

## 2024-01-15 MED ORDER — OLMESARTAN MEDOXOMIL 20 MG PO TABS: 20.0000 mg | ORAL_TABLET | Freq: Every day | ORAL | 1 refills | Status: DC

## 2024-01-15 MED ORDER — ROSUVASTATIN CALCIUM 5 MG PO TABS: ORAL_TABLET | ORAL | 1 refills | Status: DC

## 2024-01-15 MED ORDER — AMLODIPINE BESYLATE 5 MG PO TABS: 5.0000 mg | ORAL_TABLET | Freq: Every day | ORAL | 1 refills | Status: DC

## 2024-01-15 MED ORDER — TIRZEPATIDE 5 MG/0.5ML ~~LOC~~ SOAJ: 5.0000 mg | SUBCUTANEOUS | 0 refills | Status: DC

## 2024-01-15 MED ORDER — TIRZEPATIDE 2.5 MG/0.5ML ~~LOC~~ SOAJ: 2.5000 mg | SUBCUTANEOUS | 0 refills | Status: DC

## 2024-01-15 NOTE — Telephone Encounter (Signed)
 Patient came by the office with approval medicaid insurance.  The letter has been uploaded into documents, patient said Dr Antonetta needed before sending in her medications.

## 2024-01-15 NOTE — Progress Notes (Signed)
 Medications sent to [facility]

## 2024-01-16 LAB — LIPID PANEL
Chol/HDL Ratio: 3.9 ratio (ref 0.0–4.4)
Cholesterol, Total: 167 mg/dL (ref 100–199)
HDL: 43 mg/dL (ref >39)
LDL Chol Calc (NIH): 95 mg/dL (ref 0–99)
Triglycerides: 168 mg/dL — ABNORMAL HIGH (ref 0–149)
VLDL Cholesterol Cal: 29 mg/dL (ref 5–40)

## 2024-01-16 LAB — CMP14+EGFR
ALT: 46 IU/L — ABNORMAL HIGH (ref 0–32)
AST: 43 IU/L — ABNORMAL HIGH (ref 0–40)
Albumin: 4.7 g/dL (ref 3.9–4.9)
Alkaline Phosphatase: 193 IU/L — ABNORMAL HIGH (ref 44–121)
BUN/Creatinine Ratio: 12 (ref 12–28)
BUN: 13 mg/dL (ref 8–27)
Bilirubin Total: 0.5 mg/dL (ref 0.0–1.2)
CO2: 16 mmol/L — ABNORMAL LOW (ref 20–29)
Calcium: 9.6 mg/dL (ref 8.7–10.3)
Chloride: 108 mmol/L — ABNORMAL HIGH (ref 96–106)
Creatinine, Ser: 1.06 mg/dL — ABNORMAL HIGH (ref 0.57–1.00)
Globulin, Total: 2.6 g/dL (ref 1.5–4.5)
Glucose: 139 mg/dL — ABNORMAL HIGH (ref 70–99)
Potassium: 4.3 mmol/L (ref 3.5–5.2)
Sodium: 140 mmol/L (ref 134–144)
Total Protein: 7.3 g/dL (ref 6.0–8.5)
eGFR: 59 mL/min/1.73 — ABNORMAL LOW (ref >59)

## 2024-01-16 LAB — HEMOGLOBIN A1C
Est. average glucose Bld gHb Est-mCnc: 174 mg/dL
Hgb A1c MFr Bld: 7.7 % — ABNORMAL HIGH (ref 4.8–5.6)

## 2024-01-22 ENCOUNTER — Other Ambulatory Visit (HOSPITAL_COMMUNITY): Payer: Self-pay

## 2024-02-02 ENCOUNTER — Other Ambulatory Visit (HOSPITAL_COMMUNITY): Payer: Self-pay

## 2024-02-02 ENCOUNTER — Encounter: Payer: Self-pay | Admitting: Family Medicine

## 2024-02-02 ENCOUNTER — Telehealth (HOSPITAL_COMMUNITY): Payer: Self-pay | Admitting: Pharmacy Technician

## 2024-02-02 NOTE — Telephone Encounter (Signed)
 Pharmacy Patient Advocate Encounter   Received notification from Physician's Office that prior authorization for Mounjaro  is required/requested.   Insurance verification completed.   The patient is insured through [facility] .   Per test claim: PA required; PA started via CoverMyMeds. KEY [id number] . Please see clinical question(s) below that I am not finding the answer to in their chart and advise.  Has the patient had a trial and failure or insufficient response to metformin  containing products? (If this one is no, then also the ? Below.  Has the patient had a contraindication or adverse event to metformin ?

## 2024-02-05 MED ORDER — METFORMIN HCL 500 MG PO TABS: 500.0000 mg | ORAL_TABLET | Freq: Two times a day (BID) | ORAL | 3 refills | Status: AC

## 2024-02-05 NOTE — Telephone Encounter (Signed)
 Pt will start with metformin  and sghe is aware

## 2024-02-09 ENCOUNTER — Other Ambulatory Visit (HOSPITAL_COMMUNITY): Payer: Self-pay

## 2024-02-18 ENCOUNTER — Ambulatory Visit (INDEPENDENT_AMBULATORY_CARE_PROVIDER_SITE_OTHER): Payer: Self-pay | Admitting: Family Medicine

## 2024-02-18 ENCOUNTER — Encounter: Payer: Self-pay | Admitting: Family Medicine

## 2024-02-18 VITALS — BP 131/78 | HR 86 | Resp 16 | Ht 66.0 in | Wt 203.1 lb

## 2024-02-18 DIAGNOSIS — E785 Hyperlipidemia, unspecified: Secondary | ICD-10-CM

## 2024-02-18 DIAGNOSIS — E1169 Type 2 diabetes mellitus with other specified complication: Secondary | ICD-10-CM | POA: Diagnosis not present

## 2024-02-18 DIAGNOSIS — E66811 Obesity, class 1: Secondary | ICD-10-CM

## 2024-02-18 DIAGNOSIS — I10 Essential (primary) hypertension: Secondary | ICD-10-CM

## 2024-02-18 DIAGNOSIS — M25511 Pain in right shoulder: Secondary | ICD-10-CM | POA: Diagnosis not present

## 2024-02-18 DIAGNOSIS — R748 Abnormal levels of other serum enzymes: Secondary | ICD-10-CM

## 2024-02-18 DIAGNOSIS — G8929 Other chronic pain: Secondary | ICD-10-CM

## 2024-02-18 MED ORDER — AMLODIPINE-OLMESARTAN 5-20 MG PO TABS: 1.0000 | ORAL_TABLET | Freq: Every day | ORAL | 3 refills | Status: AC

## 2024-02-18 NOTE — Progress Notes (Signed)
 Latoya Decker     MRN: [id number]      DOB: 06-21-1961  Chief Complaint  Patient presents with   Hypertension    6 week follow up    Arm Pain    Still having right arm pain as last time,     HPI Latoya Decker is here for follow up and re-evaluation of chronic medical conditions, medication management and review of any available recent lab and radiology data.  Preventive health is updated, specifically  Cancer screening and Immunization.   Questions or concerns regarding consultations or procedures which the PT has had in the interim are  addressed. The PT denies any adverse reactions to current medications since the last visit.  There are no new concerns.  There are no specific complaints   ROS Denies recent fever or chills. Denies sinus pressure, nasal congestion, ear pain or sore throat. Denies chest congestion, productive cough or wheezing. Denies chest pains, palpitations and leg swelling Denies abdominal pain, nausea, vomiting,diarrhea or constipation.   Denies dysuria, frequency, hesitancy or incontinence. Denies headaches, seizures, numbness, or tingling. Denies depression, anxiety or insomnia. Denies skin break down or rash.   PE  BP 131/78   Pulse 86   Resp 16   Ht 5' 6 (1.676 m)   Wt 203 lb 1.9 oz (92.1 kg)   SpO2 97%   BMI 32.78 kg/m   Patient alert and oriented and in no cardiopulmonary distress.  HEENT: No facial asymmetry, EOMI,     Neck supple .  Chest: Clear to auscultation bilaterally.  CVS: S1, S2 no murmurs, no S3.Regular rate.  ABD: Soft non tender.   Ext: No edema  MS: Adequate ROM spine, , hips and knees.reduced in right shoulder  Skin: Intact, no ulcerations or rash noted.  Psych: Good eye contact, normal affect. Memory intact not anxious or depressed appearing.  CNS: CN 2-12 intact, power,  normal throughout.no focal deficits noted.   Assessment & Plan  Chronic right shoulder pain Pain is 7 to 10 disturbs sleep and limits  mobilityof the shoulder, refer Ortho  Essential hypertension Controlled, no change in medication DASH diet and commitment to daily physical activity for a minimum of 30 minutes discussed and encouraged, as a part of hypertension management. The importance of attaining a healthy weight is also discussed.     02/27/2024    9:46 AM 02/18/2024    1:01 PM 01/02/2024    1:09 PM 01/02/2024   12:58 PM 09/11/2023    8:52 AM 08/20/2023    1:24 PM 08/14/2023    3:31 PM  BP/Weight  Systolic BP 131 131 160 160 138 134 119  Diastolic BP 78 78 88 87 84 78 74  Wt. (Lbs) 203 203.12  211.02 202.08 197.08 198.2  BMI 32.77 kg/m2 32.78 kg/m2  34.06 kg/m2 32.62 kg/m2 31.81 kg/m2 31.99 kg/m2       Type 2 diabetes mellitus with other specified complication (HCC) Diabetes associated with hypertension, hyperlipidemia, and obesity  Latoya Decker is reminded of the importance of commitment to daily physical activity for 30 minutes or more, as able and the need to limit carbohydrate intake to 30 to 60 grams per meal to help with blood sugar control.   The need to take medication as prescribed, test blood sugar as directed, and to call between visits if there is a concern that blood sugar is uncontrolled is also discussed.   Latoya Decker is reminded of the importance of daily foot exam,  annual eye examination, and good blood sugar, blood pressure and cholesterol control.     Latest Ref Rng & Units 01/15/2024   10:45 AM 05/21/2023    1:16 PM 03/25/2023   11:48 AM 02/04/2023    2:49 PM 09/19/2022    4:33 PM  Diabetic Labs  HbA1c 4.8 - 5.6 % 7.7  6.9   7.4  6.8   Micro/Creat Ratio 0 - 29 mg/g creat   10     Chol 100 - 199 mg/dL 832   877     HDL >60 mg/dL 43   44     Calc LDL 0 - 99 mg/dL 95   54     Triglycerides 0 - 149 mg/dL 831   861     Creatinine 0.57 - 1.00 mg/dL 8.93   9.09   9.09       02/27/2024    9:46 AM 02/18/2024    1:01 PM 01/02/2024    1:09 PM 01/02/2024   12:58 PM 09/11/2023    8:52 AM 08/20/2023     1:24 PM 08/14/2023    3:31 PM  BP/Weight  Systolic BP 131 131 160 160 138 134 119  Diastolic BP 78 78 88 87 84 78 74  Wt. (Lbs) 203 203.12  211.02 202.08 197.08 198.2  BMI 32.77 kg/m2 32.78 kg/m2  34.06 kg/m2 32.62 kg/m2 31.81 kg/m2 31.99 kg/m2      02/18/2024    1:00 PM 02/04/2023    2:00 PM  Foot/eye exam completion dates  Foot Form Completion Done Done    Updated lab needed at/ before next visit.     Obesity (BMI 30.0-34.9)  Patient re-educated about  the importance of commitment to a  minimum of 150 minutes of exercise per week as able.  The importance of healthy food choices with portion control discussed, as well as eating regularly and within a 12 hour window most days. The need to choose clean , green food 50 to 75% of the time is discussed, as well as to make water  the primary drink and set a goal of 64 ounces water  daily.       02/27/2024    9:46 AM 02/18/2024    1:01 PM 01/02/2024   12:58 PM  Weight /BMI  Weight 203 lb 203 lb 1.9 oz 211 lb 0.3 oz  Height 5' 6 (1.676 m) 5' 6 (1.676 m) 5' 6 (1.676 m)  BMI 32.77 kg/m2 32.78 kg/m2 34.06 kg/m2    Improved, she is applauded on this  Hyperlipidemia LDL goal <100 Hyperlipidemia:Low fat diet discussed and encouraged.   Lipid Panel  Lab Results  Component Value Date   CHOL 167 01/15/2024   HDL 43 01/15/2024   LDLCALC 95 01/15/2024   TRIG 168 (H) 01/15/2024   CHOLHDL 3.9 01/15/2024     Needs to lower fat intake

## 2024-02-18 NOTE — Patient Instructions (Addendum)
 Follow-up mid November  Keep up great work with change in diet and exercise  Check blood sugar at least 3 times per week please Goal for fasting blood sugar ranges from 80 to 120 and 2 hours after any meal or at bedtime should be between 130 to 170. Please stop rosuvastatin  for cholesterol management because your liver enzymes are elevated.  Nonfasting hepatic panel to be drawn first week in September.  Your foot exam today is normal.  Your blood pressure is excellent.  I have combined your 2 blood pressure pills into 1 so instead of taking amlodipine  5 mg separately from olmesartan  20 mg you will take them in a combined pill at the same dose please get this  at your pharmacy.  Fasting HbA1c CMP and EGFR and lipid panel to be drawn 3 to 5 days before your November follow-up appointment.  Thanks for choosing [facility], we consider it a privelige to serve you. You

## 2024-02-18 NOTE — Assessment & Plan Note (Signed)
 Pain is 7 to 10 disturbs sleep and limits mobilityof the shoulder, refer Ortho

## 2024-02-20 ENCOUNTER — Telehealth: Payer: Self-pay | Admitting: Family Medicine

## 2024-02-20 NOTE — Telephone Encounter (Signed)
 Please advise Received teams from ortho care office the  referral person fix this referral, they didn't put OCR as the dept, they left it blank, therefore it's not falling in my WQ.

## 2024-02-24 ENCOUNTER — Telehealth: Payer: Self-pay | Admitting: Family Medicine

## 2024-02-24 NOTE — Telephone Encounter (Signed)
 [facility] Information Request Noted Copied Scanned Original in provider box Copy at front desk

## 2024-02-24 NOTE — Telephone Encounter (Signed)
 In provider box

## 2024-02-27 ENCOUNTER — Other Ambulatory Visit (INDEPENDENT_AMBULATORY_CARE_PROVIDER_SITE_OTHER): Payer: Self-pay

## 2024-02-27 ENCOUNTER — Encounter: Payer: Self-pay | Admitting: Orthopedic Surgery

## 2024-02-27 ENCOUNTER — Ambulatory Visit (INDEPENDENT_AMBULATORY_CARE_PROVIDER_SITE_OTHER): Admitting: Orthopedic Surgery

## 2024-02-27 VITALS — BP 131/78 | Ht 66.0 in | Wt 203.0 lb

## 2024-02-27 DIAGNOSIS — M7501 Adhesive capsulitis of right shoulder: Secondary | ICD-10-CM

## 2024-02-27 MED ORDER — METHYLPREDNISOLONE ACETATE 40 MG/ML IJ SUSP
40.0000 mg | Freq: Once | INTRAMUSCULAR | Status: AC
  Administered 2024-02-27: 40 mg via INTRA_ARTICULAR

## 2024-02-27 NOTE — Patient Instructions (Signed)
 Take ibuprofen  for pain 800 mg every 8 hours and Tylenol  500 mg every 8 hours  You have received an injection of steroids into the joint. 15% of patients will have increased pain within the 24 hours postinjection.   This is transient and will go away.   We recommend that you use ice packs on the injection site for 20 minutes every 2 hours and extra strength Tylenol  2 tablets every 8 as needed until the pain resolves.  If you continue to have pain after taking the Tylenol  and using the ice please call the office for further instructions.

## 2024-02-27 NOTE — Progress Notes (Signed)
 Office Visit Note   Patient: Latoya Decker           Date of Birth: July 16, 1960           MRN: [id number] Visit Date: 02/27/2024 Requested by: Antonetta Rollene BRAVO, MD [address] [place],  [place] [address] PCP: Antonetta Rollene BRAVO, MD   Assessment & Plan:   Encounter Diagnosis  Name Primary?   Adhesive capsulitis of right shoulder Yes    Meds ordered this encounter  Medications   methylPREDNISolone  acetate (DEPO-MEDROL ) injection 40 mg    Patient appears to have adhesive capsulitis of the right shoulder  Recommend physical therapy  Anti-inflammatories use meloxicam   Return every 6 to 8 weeks for evaluation to ensure that therapy is progressing well   Procedure note the subacromial injection shoulder RIGHT    Verbal consent was obtained to inject the  RIGHT   Shoulder  Timeout was completed to confirm the injection site is a subacromial space of the  RIGHT  shoulder   Medication used Depo-Medrol  40 mg and lidocaine  1% 3 cc  Anesthesia was provided by ethyl chloride  The injection was performed in the RIGHT  posterior subacromial space. After pinning the skin with alcohol and anesthetized the skin with ethyl chloride the subacromial space was injected using a 20-gauge needle. There were no complications  Sterile dressing was applied.     Subjective: Chief Complaint  Patient presents with   Shoulder Pain    Right     HPI: 63 year old female with diabetes vitamin D  deficiency presents with stiff and painful right shoulder and inability to sleep on the right side.  She is on meloxicam  but has not gotten relief  She reports no trauma              ROS: Negative for orthopedic related shoulder or neck issues   Images personally read and my interpretation :  DG Shoulder Right Result Date: 02/27/2024 X-rays of the right shoulder Right shoulder pain and stiffness No trauma Normal glenohumeral joint normal acromion AC joint has some mild peripheral  sharpening of the bone Normal x-ray right shoulder     Visit Diagnoses:  1. Adhesive capsulitis of right shoulder      Follow-Up Instructions: No follow-ups on file.    Objective: Vital Signs: BP 131/78 Comment: 02/18/24  Ht 5' 6 (1.676 m)   Wt 203 lb (92.1 kg)   BMI 32.77 kg/m   Physical Exam Vitals and nursing note reviewed.  Constitutional:      Appearance: Normal appearance.  HENT:     Head: Normocephalic and atraumatic.  Eyes:     General: No scleral icterus.       Right eye: No discharge.        Left eye: No discharge.     Extraocular Movements: Extraocular movements intact.     Conjunctiva/sclera: Conjunctivae normal.     Pupils: Pupils are equal, round, and reactive to light.  Cardiovascular:     Rate and Rhythm: Normal rate.     Pulses: Normal pulses.  Skin:    General: Skin is warm and dry.     Capillary Refill: Capillary refill takes less than 2 seconds.  Neurological:     General: No focal deficit present.     Mental Status: She is alert and oriented to person, place, and time.  Psychiatric:        Mood and Affect: Mood normal.  Behavior: Behavior normal.        Thought Content: Thought content normal.        Judgment: Judgment normal.      Ortho Exam  Left shoulder full range of motion  Right shoulder decreased internal rotation only to the hip pocket  Decreased external rotation only to 30 degrees  Abduction and flexion only 90 degrees and painful   Specialty Comments:  No specialty comments available.  Imaging: DG Shoulder Right Result Date: 02/27/2024 X-rays of the right shoulder Right shoulder pain and stiffness No trauma Normal glenohumeral joint normal acromion AC joint has some mild peripheral sharpening of the bone Normal x-ray right shoulder     PMFS History: Patient Active Problem List   Diagnosis Date Noted   Chronic right shoulder pain 02/18/2024   Acute pain of right shoulder 01/04/2024   Hyperlipidemia LDL goal  <100 03/27/2023   Seasonal and perennial allergic rhinitis 10/04/2022   Allergic conjunctivitis of both eyes 10/04/2022   Allergy  with anaphylaxis due to food 10/04/2022   Type 2 diabetes mellitus with other specified complication (HCC) 09/25/2022   Pain of right thumb 09/19/2022   Multiple food allergies 06/17/2022   Multiple pigmented nevi 06/17/2022   Onychomycosis 05/20/2021   Essential hypertension 11/29/2020   Vitamin D  deficiency 07/04/2019   Helicobacter pylori gastritis 04/16/2018   Tubular adenoma of colon 01/02/2018   Enlarged skin mole 01/02/2018   GERD (gastroesophageal reflux disease) 03/14/2017   Elevated alkaline phosphatase level 10/16/2016   Migraine variant without intractability 08/31/2012   Family history of colon cancer 05/14/2011   Duodenal ulcer without hemorrhage or perforation and without obstruction 01/12/2010   Obesity (BMI 30.0-34.9) 03/30/2008   Past Medical History:  Diagnosis Date   Anxiety    Depression    Diverticulosis    Duodenal ulcer 01/05/2010   EGD Dr Starling, h pylori gastritis, completed treatment   Duodenitis    Eosinophilic gastroenteritis JAN 2015   DEC 2014: NL CMP/GIARDIA Ag   Family hx of colon cancer    Mother & sister   Gastritis    hospitalized 6/23-6/25 2011    Headache(784.0)    migraines   Helicobacter pylori gastritis    Hypertension    Nondisplaced fracture of distal phalanx of right thumb with routine healing 10/17/17 11/13/2017   PUD (peptic ulcer disease)    Dr Claudene   S/P colonoscopy 12/27/2003   Dr Petra   S/P endoscopy 12/27/2003   Dr Monalisa superficial ulcers GEJ, duodenitis,gastritis    Family History  Problem Relation Age of Onset   Colon cancer Mother 46   Diabetes Mother    Hypertension Mother    Stroke Mother    Heart attack Father    Diabetes Father    Hypertension Father    Colon cancer Sister 98   Lupus Brother    Parkinsonism Brother    Anesthesia problems Neg Hx    Hypotension Neg  Hx    Malignant hyperthermia Neg Hx    Pseudochol deficiency Neg Hx     Past Surgical History:  Procedure Laterality Date   ABDOMINAL HYSTERECTOMY     bilateral tubal ligation  1989   BIOPSY  06/10/2011   SLF: mild gastritis, bx benign, sb bx benign    CHOLECYSTECTOMY  1997   [facility]   COLONOSCOPY  05/2011   SLF: multiple hyperplastic sessile polyps, scattered diverticulosis. next TCS 05/2016 ([facility])   COLONOSCOPY N/A 04/21/2017   Dr. Harvey: two 2-43mm colon  polyps removed, diverticulosis, hemorrhoids. next TCS in 5 years.    COLONOSCOPY WITH PROPOFOL  N/A 05/21/2022   Procedure: COLONOSCOPY WITH PROPOFOL ;  Surgeon: Cindie Carlin POUR, DO;  Location: [facility];  Service: Endoscopy;  Laterality: N/A;  11:00 am   ESOPHAGOGASTRODUODENOSCOPY N/A 07/28/2013   EOS INOPHILIC GASTROENTERITIS   ESOPHAGOGASTRODUODENOSCOPY N/A 04/21/2017   Dr. Harvey: mild gastritis with H.pylori present, esophageal biopsies negative.    MOLE REMOVAL  09/09/2022   PARTIAL HYSTERECTOMY  2007   1 ovary removed   POLYPECTOMY  06/10/2011   Procedure: POLYPECTOMY;  Surgeon: Margo CHRISTELLA Harvey, MD;  Location: [facility];  Service: Endoscopy;;  Cecal polypectomy; Sigmoid colon polypectomies   POLYPECTOMY  05/21/2022   Procedure: POLYPECTOMY INTESTINAL;  Surgeon: Cindie Carlin POUR, DO;  Location: [facility];  Service: Endoscopy;;   TUBAL LIGATION     Social History   Occupational History   Occupation: Dialysis Tech    Employer: [employer]    Employer: [employer]  Tobacco Use   Smoking status: Never    Passive exposure: Past   Smokeless tobacco: Never   Tobacco comments:    Never smoker  Vaping Use   Vaping status: Never Used  Substance and Sexual Activity   Alcohol use: No   Drug use: No   Sexual activity: Yes    Partners: Male    Birth control/protection: Surgical

## 2024-02-27 NOTE — Progress Notes (Signed)
   Intake history:  BP 131/78 Comment: 02/18/24  Ht 5' 6 (1.676 m)   Wt 203 lb (92.1 kg)   BMI 32.77 kg/m  Body mass index is 32.77 kg/m.    WHAT ARE WE SEEING YOU FOR TODAY?   right shoulder  How long has this bothered you? (DOI?DOS?WS?)   Was there an injury? No  Anticoag.  No  Diabetes Yes  Heart disease No  Hypertension Yes  SMOKING HX No  Kidney disease No     Latest Ref Rng & Units 01/15/2024   10:45 AM 03/31/2023    9:52 AM 03/25/2023   11:48 AM  CMP  Glucose 70 - 99 mg/dL 860   881   BUN 8 - 27 mg/dL 13   12   Creatinine 9.42 - 1.00 mg/dL 8.93   9.09   Sodium 865 - 144 mmol/L 140   144   Potassium 3.5 - 5.2 mmol/L 4.3   4.4   Chloride 96 - 106 mmol/L 108   107   CO2 20 - 29 mmol/L 16   20   Calcium  8.7 - 10.3 mg/dL 9.6   9.3   Total Protein 6.0 - 8.5 g/dL 7.3  7.0  7.2   Total Bilirubin 0.0 - 1.2 mg/dL 0.5  0.4  0.3   Alkaline Phos 44 - 121 IU/L 193  193  191   AST 0 - 40 IU/L 43  19  20   ALT 0 - 32 IU/L 46  20  19      Any ALLERGIES ______________________________________________   Treatment:  Have you taken:  Tylenol  No  Advil  No  Had PT No  Had injection No  Other  _____________Meloxicam ____________

## 2024-03-01 NOTE — Telephone Encounter (Signed)
 OCR placed as requested.

## 2024-03-04 ENCOUNTER — Encounter (HOSPITAL_COMMUNITY): Payer: Self-pay | Admitting: Occupational Therapy

## 2024-03-04 ENCOUNTER — Other Ambulatory Visit: Payer: Self-pay

## 2024-03-04 ENCOUNTER — Ambulatory Visit (HOSPITAL_COMMUNITY): Attending: Orthopedic Surgery | Admitting: Occupational Therapy

## 2024-03-04 DIAGNOSIS — M25511 Pain in right shoulder: Secondary | ICD-10-CM | POA: Insufficient documentation

## 2024-03-04 DIAGNOSIS — M7501 Adhesive capsulitis of right shoulder: Secondary | ICD-10-CM | POA: Diagnosis not present

## 2024-03-04 DIAGNOSIS — M25611 Stiffness of right shoulder, not elsewhere classified: Secondary | ICD-10-CM | POA: Diagnosis present

## 2024-03-04 DIAGNOSIS — R29898 Other symptoms and signs involving the musculoskeletal system: Secondary | ICD-10-CM | POA: Diagnosis present

## 2024-03-04 NOTE — Therapy (Signed)
 OUTPATIENT OCCUPATIONAL THERAPY ORTHO EVALUATION  Patient Name: Latoya Decker MRN: [id number] DOB:February 20, 1961, 63 y.o., female Today's Date: 03/04/2024   END OF SESSION:  OT End of Session - 03/04/24 1007     Visit Number 1    Number of Visits 16    Date for OT Re-Evaluation 05/03/24   progress note 04/04/24   Authorization Type [employer]    Authorization Time Period no auth required; 30 visits combined PT/OT/ST    Authorization - Visit Number 1    Authorization - Number of Visits 30    OT Start Time 0933    OT Stop Time 1006    OT Time Calculation (min) 33 min    Activity Tolerance Patient tolerated treatment well    Behavior During Therapy WFL for tasks assessed/performed          Past Medical History:  Diagnosis Date   Anxiety    Depression    Diverticulosis    Duodenal ulcer 01/05/2010   EGD Dr Starling, h pylori gastritis, completed treatment   Duodenitis    Eosinophilic gastroenteritis JAN 2015   DEC 2014: NL CMP/GIARDIA Ag   Family hx of colon cancer    Mother & sister   Gastritis    hospitalized 6/23-6/25 2011    Headache(784.0)    migraines   Helicobacter pylori gastritis    Hypertension    Nondisplaced fracture of distal phalanx of right thumb with routine healing 10/17/17 11/13/2017   PUD (peptic ulcer disease)    Dr Claudene   S/P colonoscopy 12/27/2003   Dr Petra   S/P endoscopy 12/27/2003   Dr Monalisa superficial ulcers GEJ, duodenitis,gastritis   Past Surgical History:  Procedure Laterality Date   ABDOMINAL HYSTERECTOMY     bilateral tubal ligation  1989   BIOPSY  06/10/2011   SLF: mild gastritis, bx benign, sb bx benign    CHOLECYSTECTOMY  1997   aph-Smith   COLONOSCOPY  05/2011   SLF: multiple hyperplastic sessile polyps, scattered diverticulosis. next TCS 05/2016 ([facility])   COLONOSCOPY N/A 04/21/2017   Dr. Harvey: two 2-54mm colon polyps removed, diverticulosis, hemorrhoids. next TCS in 5 years.    COLONOSCOPY WITH  PROPOFOL  N/A 05/21/2022   Procedure: COLONOSCOPY WITH PROPOFOL ;  Surgeon: Cindie Carlin POUR, DO;  Location: [facility];  Service: Endoscopy;  Laterality: N/A;  11:00 am   ESOPHAGOGASTRODUODENOSCOPY N/A 07/28/2013   EOS INOPHILIC GASTROENTERITIS   ESOPHAGOGASTRODUODENOSCOPY N/A 04/21/2017   Dr. Harvey: mild gastritis with H.pylori present, esophageal biopsies negative.    MOLE REMOVAL  09/09/2022   PARTIAL HYSTERECTOMY  2007   1 ovary removed   POLYPECTOMY  06/10/2011   Procedure: POLYPECTOMY;  Surgeon: Margo CHRISTELLA Harvey, MD;  Location: [facility];  Service: Endoscopy;;  Cecal polypectomy; Sigmoid colon polypectomies   POLYPECTOMY  05/21/2022   Procedure: POLYPECTOMY INTESTINAL;  Surgeon: Cindie Carlin POUR, DO;  Location: [facility];  Service: Endoscopy;;   TUBAL LIGATION     Patient Active Problem List   Diagnosis Date Noted   Chronic right shoulder pain 02/18/2024   Acute pain of right shoulder 01/04/2024   Hyperlipidemia LDL goal <100 03/27/2023   Seasonal and perennial allergic rhinitis 10/04/2022   Allergic conjunctivitis of both eyes 10/04/2022   Allergy  with anaphylaxis due to food 10/04/2022   Type 2 diabetes mellitus with other specified complication (HCC) 09/25/2022   Pain of right thumb 09/19/2022   Multiple food allergies 06/17/2022   Multiple pigmented nevi 06/17/2022  Onychomycosis 05/20/2021   Essential hypertension 11/29/2020   Vitamin D  deficiency 07/04/2019   Helicobacter pylori gastritis 04/16/2018   Tubular adenoma of colon 01/02/2018   Enlarged skin mole 01/02/2018   GERD (gastroesophageal reflux disease) 03/14/2017   Elevated alkaline phosphatase level 10/16/2016   Migraine variant without intractability 08/31/2012   Family history of colon cancer 05/14/2011   Duodenal ulcer without hemorrhage or perforation and without obstruction 01/12/2010   Obesity (BMI 30.0-34.9) 03/30/2008    PCP: Dr. Rollene Pesa  REFERRING PROVIDER: Dr. Taft Minerva  ONSET DATE: ~May 2025  REFERRING DIAG: M75.01 (ICD-10-CM) - Adhesive capsulitis of right shoulder   THERAPY DIAG:  Acute pain of right shoulder  Other symptoms and signs involving the musculoskeletal system  Stiffness of right shoulder, not elsewhere classified  Rationale for Evaluation and Treatment: Rehabilitation  SUBJECTIVE:   SUBJECTIVE STATEMENT: S: If I make one wrong movement, it's over. Pt accompanied by: self  PERTINENT HISTORY: Pt is a 63 y/o female presenting with right shoulder adhesive capsulitis, pain and decreased functioning has been present for approximately 2 months. Pt received steroid injection on 02/27/24 with some mild relief.  PRECAUTIONS: None  WEIGHT BEARING RESTRICTIONS: No  PAIN:  Are you having pain? Yes: NPRS scale: 3/10 Pain location: posterior deltoid and scapular region Pain description: sharp Aggravating factors: moving wrong, laying on it Relieving factors: ice  FALLS: Has patient fallen in last 6 months? No  PLOF: Independent  PATIENT GOALS: To have less pain and more mobility  NEXT MD VISIT: 04/23/24  OBJECTIVE:   HAND DOMINANCE: Right  ADLs: Overall ADLs: Pt reports difficulty with sleeping, dressing, reaching overhead and behind her back. Pt has difficulty with caring for granddaughter. Unable to perform lifting tasks, has difficulty with housekeeping, cooking, caregiving for 68 year old granddaughter.    FUNCTIONAL OUTCOME MEASURES: UEFS  Extreme difficulty/unable (0), Quite a bit of difficulty (1), Moderate difficulty (2), Little difficulty (3), No difficulty (4) Survey date:   03/04/24  Any of your usual work, household or school activities 1  2. Your usual hobbies, recreational/sport activities 0   3. Lifting a bag of groceries to waist level 0   4. Lifting a bag of groceries above your head 0  5. Grooming your hair 1  6. Pushing up on your hands (I.e. from bathtub or chair) 1  7. Preparing food (I.e.  peeling/cutting) 1  8. Driving  4  9. Vacuuming, sweeping, or raking 3  10. Dressing  1  11. Doing up buttons 2  12. Using tools/appliances 4  13. Opening doors 1  14. Cleaning  4  15. Tying or lacing shoes 4  16. Sleeping  1  17. Laundering clothes (I.e. washing, ironing, folding) 4  18. Opening a jar 0  19. Throwing a ball 0  20. Carrying a small suitcase with your affected limb.  1  Score total:  33      UPPER EXTREMITY ROM:       Assessed in sitting, er/IR adducted  Active ROM Right eval  Shoulder flexion 98  Shoulder abduction 70  Shoulder internal rotation 90  Shoulder external rotation 20  (Blank rows = not tested)    Assessed in supine, er/IR adducted  Passive ROM Right eval  Shoulder flexion 105  Shoulder abduction 75  Shoulder internal rotation 90  Shoulder external rotation 20  (Blank rows = not tested)   UPPER EXTREMITY MMT:     Assessed in sitting, er/IR adducted-observation due  to pain  MMT Right eval  Shoulder flexion 3-/5  Shoulder abduction 3-/5  Shoulder internal rotation 3/5  Shoulder external rotation 3-/5  (Blank rows = not tested)  HAND FUNCTION: Decreased due to recent thumb fracture, has HEP  SENSATION: WFL  EDEMA: None  COGNITION: Overall cognitive status: Within functional limits for tasks assessed  OBSERVATIONS: mod fascial restrictions throughout upper arm, anterior shoulder, trapezius, and scapular regions   TODAY'S TREATMENT:                                                                                                                              DATE:     PATIENT EDUCATION: Education details: Scapular A/ROM, table slides Person educated: Patient Education method: Explanation, Demonstration, and Handouts Education comprehension: verbalized understanding and returned demonstration  HOME EXERCISE PROGRAM: Eval: Scapular A/ROM, table slides  GOALS: Goals reviewed with patient? Yes   SHORT TERM GOALS:  Target date: 04/04/24  Pt will be provided with and educated on HEP to improve mobility in RUE required for use during ADL completion.   Goal status: INITIAL  2.  Pt will increase RUE P/ROM by 30+ degrees to improve ability to use RUE during dressing tasks with minimal compensatory techniques.   Goal status: INITIAL  3.  Pt will increase RUE strength to 3+/5 to improve ability to reach for items at waist to chest height during bathing and grooming tasks.   Goal status: INITIAL    LONG TERM GOALS: Target date: 05/04/24  Pt will decrease pain in RUE to 4/10 or less to improve ability to sleep for 2+ consecutive hours without waking due to pain.   Goal status: INITIAL  2.  Pt will decrease RUE fascial restrictions to min amounts or less to improve mobility required for functional reaching tasks.   Goal status: INITIAL  3.  Pt will increase RUE A/ROM by 45+ degrees to improve ability to use RUE when reaching overhead or behind back during dressing and bathing tasks.   Goal status: INITIAL  4.  Pt will increase RUE strength to 4/5 or greater to improve ability to use RUE when lifting or carrying items during meal preparation/housework/yardwork tasks.   Goal status: INITIAL   ASSESSMENT:  CLINICAL IMPRESSION: Patient is a 63 y.o. female who was seen today for occupational therapy evaluation for right shoulder adhesive capsulitis. Pt presents with increased pain and fascial restrictions, decreased ROM, strength, and functional use of the RUE for approximately 2 months. Pt with severe difficulty sleeping and using RUE during ADLs and care giving tasks.    PERFORMANCE DEFICITS: in functional skills including in functional skills including ADLs, IADLs, coordination, tone, ROM, strength, pain, fascial restrictions, muscle spasms, and UE functional use  IMPAIRMENTS: are limiting patient from ADLs, IADLs, rest and sleep, and leisure.   COMORBIDITIES: has no other co-morbidities that  affects occupational performance. Patient will benefit from skilled OT to address above impairments and improve overall function.  MODIFICATION OR ASSISTANCE TO COMPLETE EVALUATION: No modification of tasks or assist necessary to complete an evaluation.  OT OCCUPATIONAL PROFILE AND HISTORY: Problem focused assessment: Including review of records relating to presenting problem.  CLINICAL DECISION MAKING: LOW - limited treatment options, no task modification necessary  REHAB POTENTIAL: Good  EVALUATION COMPLEXITY: Low      PLAN:  OT FREQUENCY: 2x/week  OT DURATION: 8 weeks  PLANNED INTERVENTIONS: 97168 OT Re-evaluation, 97535 self care/ADL training, 02889 therapeutic exercise, 97530 therapeutic activity, 97112 neuromuscular re-education, 97140 manual therapy, 97014 electrical stimulation unattended, patient/family education, and DME and/or AE instructions  RECOMMENDED OTHER SERVICES: None at this time  CONSULTED AND AGREED WITH PLAN OF CARE: Patient  PLAN FOR NEXT SESSION: Follow up on HEP, manual techniques, passive stretching, AA/ROM progressing to A/ROM, scapular mobility and stability work   [employer], OTR/L  [phone] 03/04/2024, 10:08 AM

## 2024-03-04 NOTE — Patient Instructions (Signed)
 Scapular A/ROM Exercises   1) Seated Row   Sit up straight with elbows by your sides. Pull back with shoulders/elbows, keeping forearms straight, as if pulling back on the reins of a horse. Squeeze shoulder blades together. Repeat _10-15__times, __2-3__sets/day    2) Shoulder Elevation    Sit up straight with arms by your sides. Slowly bring your shoulders up towards your ears. Repeat_10-15__times, __2-3__ sets/day    3) Shoulder Extension    Sit up straight with both arms by your side, draw your arms back behind your waist. Keep your elbows straight. Repeat __10-15__times, __2-3__sets/day.   Shoulder A/ROM Exercises:   1) SHOULDER: Flexion On Table   Place hands on towel placed on table, elbows straight. Lean forward with you upper body, pushing towel away from body. Repeat 10 times. Do 3-5 sessions per day.  2) Abduction (Passive)   With arm out to side, resting on towel placed on table with palm DOWN, keeping trunk away from table, lean to the side while pushing towel away from body.  Repeat 10 times. Do 3-5 sessions per day.  Copyright  VHI. All rights reserved.     3) Internal Rotation (Assistive)   Seated with elbow bent at right angle and held against side, slide arm on table surface in an inward arc keeping elbow anchored in place. Repeat 10 times. Do 3-5 sessions per day. Activity: Use this motion to brush crumbs off the table.  Copyright  VHI. All rights reserved.

## 2024-03-09 ENCOUNTER — Encounter (HOSPITAL_COMMUNITY): Payer: Self-pay | Admitting: Occupational Therapy

## 2024-03-09 ENCOUNTER — Ambulatory Visit (HOSPITAL_COMMUNITY): Admitting: Occupational Therapy

## 2024-03-09 DIAGNOSIS — R29898 Other symptoms and signs involving the musculoskeletal system: Secondary | ICD-10-CM

## 2024-03-09 DIAGNOSIS — M25611 Stiffness of right shoulder, not elsewhere classified: Secondary | ICD-10-CM

## 2024-03-09 DIAGNOSIS — M25511 Pain in right shoulder: Secondary | ICD-10-CM

## 2024-03-09 NOTE — Patient Instructions (Signed)

## 2024-03-09 NOTE — Therapy (Unsigned)
 OUTPATIENT OCCUPATIONAL THERAPY ORTHO TREATMENT NOTE  Patient Name: Latoya Decker MRN: [id number] DOB:Aug 11, 1960, 63 y.o., female Today's Date: 03/10/2024   END OF SESSION:  OT End of Session - 03/10/24 0931     Visit Number 2    Number of Visits 16    Date for OT Re-Evaluation 05/03/24   progress note 04/04/24   Authorization Type [employer]    Authorization Time Period no auth required; 30 visits combined PT/OT/ST    Authorization - Visit Number 2    Authorization - Number of Visits 30    OT Start Time 0850    OT Stop Time 0931    OT Time Calculation (min) 41 min    Activity Tolerance Patient tolerated treatment well    Behavior During Therapy [facility] for tasks assessed/performed          Past Medical History:  Diagnosis Date   Anxiety    Depression    Diverticulosis    Duodenal ulcer 01/05/2010   EGD Dr Starling, h pylori gastritis, completed treatment   Duodenitis    Eosinophilic gastroenteritis JAN 2015   DEC 2014: NL CMP/GIARDIA Ag   Family hx of colon cancer    Mother & sister   Gastritis    hospitalized 6/23-6/25 2011    Headache(784.0)    migraines   Helicobacter pylori gastritis    Hypertension    Nondisplaced fracture of distal phalanx of right thumb with routine healing 10/17/17 11/13/2017   PUD (peptic ulcer disease)    Dr Claudene   S/P colonoscopy 12/27/2003   Dr Petra   S/P endoscopy 12/27/2003   Dr Monalisa superficial ulcers GEJ, duodenitis,gastritis   Past Surgical History:  Procedure Laterality Date   ABDOMINAL HYSTERECTOMY     bilateral tubal ligation  1989   BIOPSY  06/10/2011   SLF: mild gastritis, bx benign, sb bx benign    CHOLECYSTECTOMY  1997   aph-Smith   COLONOSCOPY  05/2011   SLF: multiple hyperplastic sessile polyps, scattered diverticulosis. next TCS 05/2016 ([facility])   COLONOSCOPY N/A 04/21/2017   Dr. Harvey: two 2-42mm colon polyps removed, diverticulosis, hemorrhoids. next TCS in 5 years.    COLONOSCOPY WITH  PROPOFOL  N/A 05/21/2022   Procedure: COLONOSCOPY WITH PROPOFOL ;  Surgeon: Cindie Carlin POUR, DO;  [facility];  Service: Endoscopy;  Laterality: N/A;  11:00 am   ESOPHAGOGASTRODUODENOSCOPY N/A 07/28/2013   EOS INOPHILIC GASTROENTERITIS   ESOPHAGOGASTRODUODENOSCOPY N/A 04/21/2017   Dr. Harvey: mild gastritis with H.pylori present, esophageal biopsies negative.    MOLE REMOVAL  09/09/2022   PARTIAL HYSTERECTOMY  2007   1 ovary removed   POLYPECTOMY  06/10/2011   Procedure: POLYPECTOMY;  Surgeon: Margo CHRISTELLA Harvey, MD;  Location: [facility];  Service: Endoscopy;;  Cecal polypectomy; Sigmoid colon polypectomies   POLYPECTOMY  05/21/2022   Procedure: POLYPECTOMY INTESTINAL;  Surgeon: Cindie Carlin POUR, DO;  Location: [facility];  Service: Endoscopy;;   TUBAL LIGATION     Patient Active Problem List   Diagnosis Date Noted   Chronic right shoulder pain 02/18/2024   Acute pain of right shoulder 01/04/2024   Hyperlipidemia LDL goal <100 03/27/2023   Seasonal and perennial allergic rhinitis 10/04/2022   Allergic conjunctivitis of both eyes 10/04/2022   Allergy  with anaphylaxis due to food 10/04/2022   Type 2 diabetes mellitus with other specified complication (HCC) 09/25/2022   Pain of right thumb 09/19/2022   Multiple food allergies 06/17/2022   Multiple pigmented nevi 06/17/2022  Onychomycosis 05/20/2021   Essential hypertension 11/29/2020   Vitamin D  deficiency 07/04/2019   Helicobacter pylori gastritis 04/16/2018   Tubular adenoma of colon 01/02/2018   Enlarged skin mole 01/02/2018   GERD (gastroesophageal reflux disease) 03/14/2017   Elevated alkaline phosphatase level 10/16/2016   Migraine variant without intractability 08/31/2012   Family history of colon cancer 05/14/2011   Duodenal ulcer without hemorrhage or perforation and without obstruction 01/12/2010   Obesity (BMI 30.0-34.9) 03/30/2008    PCP: Dr. Rollene Pesa  REFERRING PROVIDER: Dr. Taft Minerva  ONSET DATE: ~May 2025  REFERRING DIAG: M75.01 (ICD-10-CM) - Adhesive capsulitis of right shoulder   THERAPY DIAG:  Acute pain of right shoulder  Stiffness of right shoulder, not elsewhere classified  Other symptoms and signs involving the musculoskeletal system  Rationale for Evaluation and Treatment: Rehabilitation  SUBJECTIVE:   SUBJECTIVE STATEMENT: S: I think the exercises have been helping. Pt accompanied by: self  PERTINENT HISTORY: Pt is a 63 y/o female presenting with right shoulder adhesive capsulitis, pain and decreased functioning has been present for approximately 2 months. Pt received steroid injection on 02/27/24 with some mild relief.  PRECAUTIONS: None  WEIGHT BEARING RESTRICTIONS: No  PAIN:  Are you having pain? Yes: NPRS scale: 1/10 Pain location: posterior deltoid and scapular region Pain description: sharp Aggravating factors: moving wrong, laying on it Relieving factors: ice  FALLS: Has patient fallen in last 6 months? No  PLOF: Independent  PATIENT GOALS: To have less pain and more mobility  NEXT MD VISIT: 04/23/24  OBJECTIVE:   HAND DOMINANCE: Right  ADLs: Overall ADLs: Pt reports difficulty with sleeping, dressing, reaching overhead and behind her back. Pt has difficulty with caring for granddaughter. Unable to perform lifting tasks, has difficulty with housekeeping, cooking, caregiving for 27 year old granddaughter.    FUNCTIONAL OUTCOME MEASURES: UEFS  Extreme difficulty/unable (0), Quite a bit of difficulty (1), Moderate difficulty (2), Little difficulty (3), No difficulty (4) Survey date:   03/04/24  Any of your usual work, household or school activities 1  2. Your usual hobbies, recreational/sport activities 0   3. Lifting a bag of groceries to waist level 0   4. Lifting a bag of groceries above your head 0  5. Grooming your hair 1  6. Pushing up on your hands (I.e. from bathtub or chair) 1  7. Preparing food (I.e.  peeling/cutting) 1  8. Driving  4  9. Vacuuming, sweeping, or raking 3  10. Dressing  1  11. Doing up buttons 2  12. Using tools/appliances 4  13. Opening doors 1  14. Cleaning  4  15. Tying or lacing shoes 4  16. Sleeping  1  17. Laundering clothes (I.e. washing, ironing, folding) 4  18. Opening a jar 0  19. Throwing a ball 0  20. Carrying a small suitcase with your affected limb.  1  Score total:  33      UPPER EXTREMITY ROM:       Assessed in sitting, er/IR adducted  Active ROM Right eval  Shoulder flexion 98  Shoulder abduction 70  Shoulder internal rotation 90  Shoulder external rotation 20  (Blank rows = not tested)    Assessed in supine, er/IR adducted  Passive ROM Right eval  Shoulder flexion 105  Shoulder abduction 75  Shoulder internal rotation 90  Shoulder external rotation 20  (Blank rows = not tested)   UPPER EXTREMITY MMT:     Assessed in sitting, er/IR adducted-observation due to  pain  MMT Right eval  Shoulder flexion 3-/5  Shoulder abduction 3-/5  Shoulder internal rotation 3/5  Shoulder external rotation 3-/5  (Blank rows = not tested)  HAND FUNCTION: Decreased due to recent thumb fracture, has HEP  SENSATION: WFL  EDEMA: None  COGNITION: Overall cognitive status: Within functional limits for tasks assessed  OBSERVATIONS: mod fascial restrictions throughout upper arm, anterior shoulder, trapezius, and scapular regions   TODAY'S TREATMENT:                                                                                                                              DATE:    03/09/24 -manual therapy: myofascial release and trigger point applied to biceps, scapular region, and trapezius in order to reduce pain and fascial restrictions in order to improve ROM -P/ROM: flexion, abduction, er/IR, x10 -AA/ROM: supine, protraction, flexion, abduction, horizontal abduction, er/IR, x10 -Scapular ROM: elevation/depression, rows,  x10   PATIENT EDUCATION: Education details: Scapular A/ROM, table slides Person educated: Patient Education method: Explanation, Demonstration, and Handouts Education comprehension: verbalized understanding and returned demonstration  HOME EXERCISE PROGRAM: Eval: Scapular A/ROM, table slides  GOALS: Goals reviewed with patient? Yes   SHORT TERM GOALS: Target date: 04/04/24  Pt will be provided with and educated on HEP to improve mobility in RUE required for use during ADL completion.   Goal status: INITIAL  2.  Pt will increase RUE P/ROM by 30+ degrees to improve ability to use RUE during dressing tasks with minimal compensatory techniques.   Goal status: INITIAL  3.  Pt will increase RUE strength to 3+/5 to improve ability to reach for items at waist to chest height during bathing and grooming tasks.   Goal status: INITIAL    LONG TERM GOALS: Target date: 05/04/24  Pt will decrease pain in RUE to 4/10 or less to improve ability to sleep for 2+ consecutive hours without waking due to pain.   Goal status: INITIAL  2.  Pt will decrease RUE fascial restrictions to min amounts or less to improve mobility required for functional reaching tasks.   Goal status: INITIAL  3.  Pt will increase RUE A/ROM by 45+ degrees to improve ability to use RUE when reaching overhead or behind back during dressing and bathing tasks.   Goal status: INITIAL  4.  Pt will increase RUE strength to 4/5 or greater to improve ability to use RUE when lifting or carrying items during meal preparation/housework/yardwork tasks.   Goal status: INITIAL   ASSESSMENT:  CLINICAL IMPRESSION: This session she demonstrated improving ROM and improved pain levels. She was able to achieve ~120 in flexion and abduction during both abduction and flexion. Pain level remained below 6/10 the entire session. Pt was able to start AA/ROM with continued ROM from P/ROM. Verbal and tactile cuing provided for positioning  and technique.   PERFORMANCE DEFICITS: in functional skills including in functional skills including ADLs, IADLs, coordination, tone, ROM, strength, pain, fascial  restrictions, muscle spasms, and UE functional use   PLAN:  OT FREQUENCY: 2x/week  OT DURATION: 8 weeks  PLANNED INTERVENTIONS: 97168 OT Re-evaluation, 97535 self care/ADL training, 02889 therapeutic exercise, 97530 therapeutic activity, 97112 neuromuscular re-education, 97140 manual therapy, 97014 electrical stimulation unattended, patient/family education, and DME and/or AE instructions  RECOMMENDED OTHER SERVICES: None at this time  CONSULTED AND AGREED WITH PLAN OF CARE: Patient  PLAN FOR NEXT SESSION: Follow up on HEP, manual techniques, passive stretching, AA/ROM progressing to A/ROM, scapular mobility and stability work   Valentin [employer], OTR/L [phone] 03/10/2024, 9:51 AM

## 2024-03-11 ENCOUNTER — Ambulatory Visit (HOSPITAL_COMMUNITY): Admitting: Occupational Therapy

## 2024-03-11 DIAGNOSIS — M25611 Stiffness of right shoulder, not elsewhere classified: Secondary | ICD-10-CM

## 2024-03-11 DIAGNOSIS — M25511 Pain in right shoulder: Secondary | ICD-10-CM

## 2024-03-11 DIAGNOSIS — R29898 Other symptoms and signs involving the musculoskeletal system: Secondary | ICD-10-CM

## 2024-03-11 NOTE — Therapy (Signed)
 OUTPATIENT OCCUPATIONAL THERAPY ORTHO TREATMENT NOTE  Patient Name: Latoya Decker MRN: [id number] DOB:October 22, 1960, 63 y.o., female Today's Date: 03/11/2024   END OF SESSION:  OT End of Session - 03/11/24 0940     Visit Number 3    Number of Visits 16    Date for OT Re-Evaluation 05/03/24   progress note 04/04/24   Authorization Type [employer]    Authorization Time Period no auth required; 30 visits combined PT/OT/ST    Authorization - Visit Number 3    Authorization - Number of Visits 30    OT Start Time 0848    OT Stop Time 0930    OT Time Calculation (min) 42 min    Activity Tolerance Patient tolerated treatment well    Behavior During Therapy WFL for tasks assessed/performed           Past Medical History:  Diagnosis Date   Anxiety    Depression    Diverticulosis    Duodenal ulcer 01/05/2010   EGD Dr Starling, h pylori gastritis, completed treatment   Duodenitis    Eosinophilic gastroenteritis JAN 2015   DEC 2014: NL CMP/GIARDIA Ag   Family hx of colon cancer    Mother & sister   Gastritis    hospitalized 6/23-6/25 2011    Headache(784.0)    migraines   Helicobacter pylori gastritis    Hypertension    Nondisplaced fracture of distal phalanx of right thumb with routine healing 10/17/17 11/13/2017   PUD (peptic ulcer disease)    Dr Claudene   S/P colonoscopy 12/27/2003   Dr Petra   S/P endoscopy 12/27/2003   Dr Monalisa superficial ulcers GEJ, duodenitis,gastritis   Past Surgical History:  Procedure Laterality Date   ABDOMINAL HYSTERECTOMY     bilateral tubal ligation  1989   BIOPSY  06/10/2011   SLF: mild gastritis, bx benign, sb bx benign    CHOLECYSTECTOMY  1997   aph-Smith   COLONOSCOPY  05/2011   SLF: multiple hyperplastic sessile polyps, scattered diverticulosis. next TCS 05/2016 ([facility])   COLONOSCOPY N/A 04/21/2017   Dr. Harvey: two 2-30mm colon polyps removed, diverticulosis, hemorrhoids. next TCS in 5 years.    COLONOSCOPY WITH  PROPOFOL  N/A 05/21/2022   Procedure: COLONOSCOPY WITH PROPOFOL ;  Surgeon: Cindie Carlin POUR, DO;  Location: [facility];  Service: Endoscopy;  Laterality: N/A;  11:00 am   ESOPHAGOGASTRODUODENOSCOPY N/A 07/28/2013   EOS INOPHILIC GASTROENTERITIS   ESOPHAGOGASTRODUODENOSCOPY N/A 04/21/2017   Dr. Harvey: mild gastritis with H.pylori present, esophageal biopsies negative.    MOLE REMOVAL  09/09/2022   PARTIAL HYSTERECTOMY  2007   1 ovary removed   POLYPECTOMY  06/10/2011   Procedure: POLYPECTOMY;  Surgeon: Margo CHRISTELLA Harvey, MD;  Location: [facility];  Service: Endoscopy;;  Cecal polypectomy; Sigmoid colon polypectomies   POLYPECTOMY  05/21/2022   Procedure: POLYPECTOMY INTESTINAL;  Surgeon: Cindie Carlin POUR, DO;  Location: [facility];  Service: Endoscopy;;   TUBAL LIGATION     Patient Active Problem List   Diagnosis Date Noted   Chronic right shoulder pain 02/18/2024   Acute pain of right shoulder 01/04/2024   Hyperlipidemia LDL goal <100 03/27/2023   Seasonal and perennial allergic rhinitis 10/04/2022   Allergic conjunctivitis of both eyes 10/04/2022   Allergy  with anaphylaxis due to food 10/04/2022   Type 2 diabetes mellitus with other specified complication (HCC) 09/25/2022   Pain of right thumb 09/19/2022   Multiple food allergies 06/17/2022   Multiple pigmented nevi  06/17/2022   Onychomycosis 05/20/2021   Essential hypertension 11/29/2020   Vitamin D  deficiency 07/04/2019   Helicobacter pylori gastritis 04/16/2018   Tubular adenoma of colon 01/02/2018   Enlarged skin mole 01/02/2018   GERD (gastroesophageal reflux disease) 03/14/2017   Elevated alkaline phosphatase level 10/16/2016   Migraine variant without intractability 08/31/2012   Family history of colon cancer 05/14/2011   Duodenal ulcer without hemorrhage or perforation and without obstruction 01/12/2010   Obesity (BMI 30.0-34.9) 03/30/2008    PCP: Dr. Rollene Pesa  REFERRING PROVIDER: Dr. Taft Minerva  ONSET DATE: ~May 2025  REFERRING DIAG: M75.01 (ICD-10-CM) - Adhesive capsulitis of right shoulder   THERAPY DIAG:  Acute pain of right shoulder  Stiffness of right shoulder, not elsewhere classified  Other symptoms and signs involving the musculoskeletal system  Rationale for Evaluation and Treatment: Rehabilitation  SUBJECTIVE:   SUBJECTIVE STATEMENT: S: I'm sore today Pt accompanied by: self  PERTINENT HISTORY: Pt is a 63 y/o female presenting with right shoulder adhesive capsulitis, pain and decreased functioning has been present for approximately 2 months. Pt received steroid injection on 02/27/24 with some mild relief.  PRECAUTIONS: None  WEIGHT BEARING RESTRICTIONS: No  PAIN:  Are you having pain? Yes: NPRS scale: 1/10 Pain location: posterior deltoid and scapular region Pain description: sharp Aggravating factors: moving wrong, laying on it Relieving factors: ice  FALLS: Has patient fallen in last 6 months? No  PLOF: Independent  PATIENT GOALS: To have less pain and more mobility  NEXT MD VISIT: 04/23/24  OBJECTIVE:   HAND DOMINANCE: Right  ADLs: Overall ADLs: Pt reports difficulty with sleeping, dressing, reaching overhead and behind her back. Pt has difficulty with caring for granddaughter. Unable to perform lifting tasks, has difficulty with housekeeping, cooking, caregiving for 75 year old granddaughter.    FUNCTIONAL OUTCOME MEASURES: UEFS  Extreme difficulty/unable (0), Quite a bit of difficulty (1), Moderate difficulty (2), Little difficulty (3), No difficulty (4) Survey date:   03/04/24  Any of your usual work, household or school activities 1  2. Your usual hobbies, recreational/sport activities 0   3. Lifting a bag of groceries to waist level 0   4. Lifting a bag of groceries above your head 0  5. Grooming your hair 1  6. Pushing up on your hands (I.e. from bathtub or chair) 1  7. Preparing food (I.e. peeling/cutting) 1  8. Driving   4  9. Vacuuming, sweeping, or raking 3  10. Dressing  1  11. Doing up buttons 2  12. Using tools/appliances 4  13. Opening doors 1  14. Cleaning  4  15. Tying or lacing shoes 4  16. Sleeping  1  17. Laundering clothes (I.e. washing, ironing, folding) 4  18. Opening a jar 0  19. Throwing a ball 0  20. Carrying a small suitcase with your affected limb.  1  Score total:  33      UPPER EXTREMITY ROM:       Assessed in sitting, er/IR adducted  Active ROM Right eval  Shoulder flexion 98  Shoulder abduction 70  Shoulder internal rotation 90  Shoulder external rotation 20  (Blank rows = not tested)    Assessed in supine, er/IR adducted  Passive ROM Right eval  Shoulder flexion 105  Shoulder abduction 75  Shoulder internal rotation 90  Shoulder external rotation 20  (Blank rows = not tested)   UPPER EXTREMITY MMT:     Assessed in sitting, er/IR adducted-observation due to pain  MMT Right eval  Shoulder flexion 3-/5  Shoulder abduction 3-/5  Shoulder internal rotation 3/5  Shoulder external rotation 3-/5  (Blank rows = not tested)  HAND FUNCTION: Decreased due to recent thumb fracture, has HEP  SENSATION: WFL  EDEMA: None  COGNITION: Overall cognitive status: Within functional limits for tasks assessed  OBSERVATIONS: mod fascial restrictions throughout upper arm, anterior shoulder, trapezius, and scapular regions   TODAY'S TREATMENT:                                                                                                                              DATE:    03/11/24 -manual therapy: myofascial release and trigger point applied to biceps, scapular region, and trapezius in order to reduce pain and fascial restrictions in order to improve ROM -P/ROM: flexion, abduction, er/IR, x10 -AA/ROM: supine, protraction, flexion, abduction, horizontal abduction, er/IR, x10 -Pulleys: flexion, abduction, x60 -Wall Climbs: flexion, abduction,  x10  03/09/24 -manual therapy: myofascial release and trigger point applied to biceps, scapular region, and trapezius in order to reduce pain and fascial restrictions in order to improve ROM -P/ROM: flexion, abduction, er/IR, x10 -AA/ROM: supine, protraction, flexion, abduction, horizontal abduction, er/IR, x10 -Scapular ROM: elevation/depression, rows, x10   PATIENT EDUCATION: Education details: Wall Climbs Person educated: Patient Education method: Explanation, Demonstration, and Handouts Education comprehension: verbalized understanding and returned demonstration  HOME EXERCISE PROGRAM: Eval: Scapular A/ROM, table slides 8/28: Wall Climbs  GOALS: Goals reviewed with patient? Yes   SHORT TERM GOALS: Target date: 04/04/24  Pt will be provided with and educated on HEP to improve mobility in RUE required for use during ADL completion.   Goal status: IN PROGRESS  2.  Pt will increase RUE P/ROM by 30+ degrees to improve ability to use RUE during dressing tasks with minimal compensatory techniques.   Goal status: IN PROGRESS  3.  Pt will increase RUE strength to 3+/5 to improve ability to reach for items at waist to chest height during bathing and grooming tasks.   Goal status: IN PROGRESS    LONG TERM GOALS: Target date: 05/04/24  Pt will decrease pain in RUE to 4/10 or less to improve ability to sleep for 2+ consecutive hours without waking due to pain.   Goal status: IN PROGRESS  2.  Pt will decrease RUE fascial restrictions to min amounts or less to improve mobility required for functional reaching tasks.   Goal status: IN PROGRESS  3.  Pt will increase RUE A/ROM by 45+ degrees to improve ability to use RUE when reaching overhead or behind back during dressing and bathing tasks.   Goal status: IN PROGRESS  4.  Pt will increase RUE strength to 4/5 or greater to improve ability to use RUE when lifting or carrying items during meal preparation/housework/yardwork tasks.    Goal status: IN PROGRESS   ASSESSMENT:  CLINICAL IMPRESSION: Pt continuing to have improving ROM. She continues to have high pain with initial P/ROM,  however with increased reps she is able to tolerate further ranges. OT added pulleys and wall climbs this session to continue with addressing ROM and stretching into her ranges. Verbal and tactile cuing provided for positioning and technique throughout session.   PERFORMANCE DEFICITS: in functional skills including in functional skills including ADLs, IADLs, coordination, tone, ROM, strength, pain, fascial restrictions, muscle spasms, and UE functional use   PLAN:  OT FREQUENCY: 2x/week  OT DURATION: 8 weeks  PLANNED INTERVENTIONS: 97168 OT Re-evaluation, 97535 self care/ADL training, 02889 therapeutic exercise, 97530 therapeutic activity, 97112 neuromuscular re-education, 97140 manual therapy, 97014 electrical stimulation unattended, patient/family education, and DME and/or AE instructions  RECOMMENDED OTHER SERVICES: None at this time  CONSULTED AND AGREED WITH PLAN OF CARE: Patient  PLAN FOR NEXT SESSION: Follow up on HEP, manual techniques, passive stretching, AA/ROM progressing to A/ROM, scapular mobility and stability work   Valentin Nightingale, OTR/L [phone] 03/11/2024, 9:41 AM

## 2024-03-13 LAB — HEPATIC FUNCTION PANEL
ALT: 14 IU/L (ref 0–32)
AST: 13 IU/L (ref 0–40)
Albumin: 4.3 g/dL (ref 3.9–4.9)
Alkaline Phosphatase: 196 IU/L — ABNORMAL HIGH (ref 44–121)
Bilirubin Total: 0.2 mg/dL (ref 0.0–1.2)
Bilirubin, Direct: 0.1 mg/dL (ref 0.00–0.40)
Total Protein: 6.4 g/dL (ref 6.0–8.5)

## 2024-03-14 ENCOUNTER — Ambulatory Visit: Payer: Self-pay | Admitting: Family Medicine

## 2024-03-14 DIAGNOSIS — R748 Abnormal levels of other serum enzymes: Secondary | ICD-10-CM

## 2024-03-16 NOTE — Assessment & Plan Note (Signed)
 Controlled, no change in medication DASH diet and commitment to daily physical activity for a minimum of 30 minutes discussed and encouraged, as a part of hypertension management. The importance of attaining a healthy weight is also discussed.     02/27/2024    9:46 AM 02/18/2024    1:01 PM 01/02/2024    1:09 PM 01/02/2024   12:58 PM 09/11/2023    8:52 AM 08/20/2023    1:24 PM 08/14/2023    3:31 PM  BP/Weight  Systolic BP 131 131 160 160 138 134 119  Diastolic BP 78 78 88 87 84 78 74  Wt. (Lbs) 203 203.12  211.02 202.08 197.08 198.2  BMI 32.77 kg/m2 32.78 kg/m2  34.06 kg/m2 32.62 kg/m2 31.81 kg/m2 31.99 kg/m2

## 2024-03-16 NOTE — Assessment & Plan Note (Signed)
 Diabetes associated with hypertension, hyperlipidemia, and obesity  Latoya Decker is reminded of the importance of commitment to daily physical activity for 30 minutes or more, as able and the need to limit carbohydrate intake to 30 to 60 grams per meal to help with blood sugar control.   The need to take medication as prescribed, test blood sugar as directed, and to call between visits if there is a concern that blood sugar is uncontrolled is also discussed.   Latoya Decker is reminded of the importance of daily foot exam, annual eye examination, and good blood sugar, blood pressure and cholesterol control.     Latest Ref Rng & Units 01/15/2024   10:45 AM 05/21/2023    1:16 PM 03/25/2023   11:48 AM 02/04/2023    2:49 PM 09/19/2022    4:33 PM  Diabetic Labs  HbA1c 4.8 - 5.6 % 7.7  6.9   7.4  6.8   Micro/Creat Ratio 0 - 29 mg/g creat   10     Chol 100 - 199 mg/dL 832   877     HDL >60 mg/dL 43   44     Calc LDL 0 - 99 mg/dL 95   54     Triglycerides 0 - 149 mg/dL 831   861     Creatinine 0.57 - 1.00 mg/dL 8.93   9.09   9.09       02/27/2024    9:46 AM 02/18/2024    1:01 PM 01/02/2024    1:09 PM 01/02/2024   12:58 PM 09/11/2023    8:52 AM 08/20/2023    1:24 PM 08/14/2023    3:31 PM  BP/Weight  Systolic BP 131 131 160 160 138 134 119  Diastolic BP 78 78 88 87 84 78 74  Wt. (Lbs) 203 203.12  211.02 202.08 197.08 198.2  BMI 32.77 kg/m2 32.78 kg/m2  34.06 kg/m2 32.62 kg/m2 31.81 kg/m2 31.99 kg/m2      02/18/2024    1:00 PM 02/04/2023    2:00 PM  Foot/eye exam completion dates  Foot Form Completion Done Done    Updated lab needed at/ before next visit.

## 2024-03-16 NOTE — Assessment & Plan Note (Signed)
 Hyperlipidemia:Low fat diet discussed and encouraged.   Lipid Panel  Lab Results  Component Value Date   CHOL 167 01/15/2024   HDL 43 01/15/2024   LDLCALC 95 01/15/2024   TRIG 168 (H) 01/15/2024   CHOLHDL 3.9 01/15/2024     Needs to lower fat intake

## 2024-03-16 NOTE — Assessment & Plan Note (Signed)
  Patient re-educated about  the importance of commitment to a  minimum of 150 minutes of exercise per week as able.  The importance of healthy food choices with portion control discussed, as well as eating regularly and within a 12 hour window most days. The need to choose clean , green food 50 to 75% of the time is discussed, as well as to make water  the primary drink and set a goal of 64 ounces water  daily.       02/27/2024    9:46 AM 02/18/2024    1:01 PM 01/02/2024   12:58 PM  Weight /BMI  Weight 203 lb 203 lb 1.9 oz 211 lb 0.3 oz  Height 5' 6 (1.676 m) 5' 6 (1.676 m) 5' 6 (1.676 m)  BMI 32.77 kg/m2 32.78 kg/m2 34.06 kg/m2    Improved, she is applauded on this

## 2024-03-17 ENCOUNTER — Telehealth: Payer: Self-pay | Admitting: Orthopedic Surgery

## 2024-03-17 ENCOUNTER — Encounter (HOSPITAL_COMMUNITY): Payer: Self-pay | Admitting: Occupational Therapy

## 2024-03-17 ENCOUNTER — Ambulatory Visit (HOSPITAL_COMMUNITY): Attending: Family Medicine | Admitting: Occupational Therapy

## 2024-03-17 DIAGNOSIS — M25611 Stiffness of right shoulder, not elsewhere classified: Secondary | ICD-10-CM | POA: Insufficient documentation

## 2024-03-17 DIAGNOSIS — M25511 Pain in right shoulder: Secondary | ICD-10-CM | POA: Diagnosis present

## 2024-03-17 DIAGNOSIS — R29898 Other symptoms and signs involving the musculoskeletal system: Secondary | ICD-10-CM | POA: Insufficient documentation

## 2024-03-17 NOTE — Therapy (Signed)
 OUTPATIENT OCCUPATIONAL THERAPY ORTHO TREATMENT NOTE  Patient Name: Latoya Decker MRN: [id number] DOB:1960-11-28, 63 y.o., female Today's Date: 03/17/2024   END OF SESSION:  OT End of Session - 03/17/24 0835     Visit Number 4    Number of Visits 16    Date for OT Re-Evaluation 05/03/24   progress note 04/04/24   Authorization Type [employer]    Authorization Time Period no auth required; 30 visits combined PT/OT/ST    Authorization - Visit Number 4    Authorization - Number of Visits 30    OT Start Time [phone]    OT Stop Time 1019    OT Time Calculation (min) 41 min    Activity Tolerance Patient tolerated treatment well    Behavior During Therapy [facility] for tasks assessed/performed          Past Medical History:  Diagnosis Date   Anxiety    Depression    Diverticulosis    Duodenal ulcer 01/05/2010   EGD Dr Starling, h pylori gastritis, completed treatment   Duodenitis    Eosinophilic gastroenteritis JAN 2015   DEC 2014: NL CMP/GIARDIA Ag   Family hx of colon cancer    Mother & sister   Gastritis    hospitalized 6/23-6/25 2011    Headache(784.0)    migraines   Helicobacter pylori gastritis    Hypertension    Nondisplaced fracture of distal phalanx of right thumb with routine healing 10/17/17 11/13/2017   PUD (peptic ulcer disease)    Dr Claudene   S/P colonoscopy 12/27/2003   Dr Petra   S/P endoscopy 12/27/2003   Dr Monalisa superficial ulcers GEJ, duodenitis,gastritis   Past Surgical History:  Procedure Laterality Date   ABDOMINAL HYSTERECTOMY     bilateral tubal ligation  1989   BIOPSY  06/10/2011   SLF: mild gastritis, bx benign, sb bx benign    CHOLECYSTECTOMY  1997   aph-Smith   COLONOSCOPY  05/2011   SLF: multiple hyperplastic sessile polyps, scattered diverticulosis. next TCS 05/2016 ([facility] CRC)   COLONOSCOPY N/A 04/21/2017   Dr. Harvey: two 2-63mm colon polyps removed, diverticulosis, hemorrhoids. next TCS in 5 years.    COLONOSCOPY WITH  PROPOFOL  N/A 05/21/2022   Procedure: COLONOSCOPY WITH PROPOFOL ;  Surgeon: Cindie Carlin POUR, DO;  [facility];  Service: Endoscopy;  Laterality: N/A;  11:00 am   ESOPHAGOGASTRODUODENOSCOPY N/A 07/28/2013   EOS INOPHILIC GASTROENTERITIS   ESOPHAGOGASTRODUODENOSCOPY N/A 04/21/2017   Dr. Harvey: mild gastritis with H.pylori present, esophageal biopsies negative.    MOLE REMOVAL  09/09/2022   PARTIAL HYSTERECTOMY  2007   1 ovary removed   POLYPECTOMY  06/10/2011   Procedure: POLYPECTOMY;  Surgeon: Margo CHRISTELLA Harvey, MD;  Location: [facility];  Service: Endoscopy;;  Cecal polypectomy; Sigmoid colon polypectomies   POLYPECTOMY  05/21/2022   Procedure: POLYPECTOMY INTESTINAL;  Surgeon: Cindie Carlin POUR, DO;  Location: [facility];  Service: Endoscopy;;   TUBAL LIGATION     Patient Active Problem List   Diagnosis Date Noted   Chronic right shoulder pain 02/18/2024   Acute pain of right shoulder 01/04/2024   Hyperlipidemia LDL goal <100 03/27/2023   Seasonal and perennial allergic rhinitis 10/04/2022   Allergic conjunctivitis of both eyes 10/04/2022   Allergy  with anaphylaxis due to food 10/04/2022   Type 2 diabetes mellitus with other specified complication (HCC) 09/25/2022   Pain of right thumb 09/19/2022   Multiple food allergies 06/17/2022   Multiple pigmented nevi 06/17/2022  Onychomycosis 05/20/2021   Essential hypertension 11/29/2020   Vitamin D  deficiency 07/04/2019   Helicobacter pylori gastritis 04/16/2018   Tubular adenoma of colon 01/02/2018   Enlarged skin mole 01/02/2018   GERD (gastroesophageal reflux disease) 03/14/2017   Elevated alkaline phosphatase level 10/16/2016   Migraine variant without intractability 08/31/2012   Family history of colon cancer 05/14/2011   Duodenal ulcer without hemorrhage or perforation and without obstruction 01/12/2010   Obesity (BMI 30.0-34.9) 03/30/2008    PCP: Dr. Rollene Pesa  REFERRING PROVIDER: Dr. Taft Minerva  ONSET DATE: ~May 2025  REFERRING DIAG: M75.01 (ICD-10-CM) - Adhesive capsulitis of right shoulder   THERAPY DIAG:  Acute pain of right shoulder  Stiffness of right shoulder, not elsewhere classified  Other symptoms and signs involving the musculoskeletal system  Rationale for Evaluation and Treatment: Rehabilitation  SUBJECTIVE:   SUBJECTIVE STATEMENT: S: I'm not hurting yet Pt accompanied by: self  PERTINENT HISTORY: Pt is a 63 y/o female presenting with right shoulder adhesive capsulitis, pain and decreased functioning has been present for approximately 2 months. Pt received steroid injection on 02/27/24 with some mild relief.  PRECAUTIONS: None  WEIGHT BEARING RESTRICTIONS: No  PAIN:  Are you having pain? Yes: NPRS scale: 1/10 Pain location: posterior deltoid and scapular region Pain description: sharp Aggravating factors: moving wrong, laying on it Relieving factors: ice  FALLS: Has patient fallen in last 6 months? No  PLOF: Independent  PATIENT GOALS: To have less pain and more mobility  NEXT MD VISIT: 04/23/24  OBJECTIVE:   HAND DOMINANCE: Right  ADLs: Overall ADLs: Pt reports difficulty with sleeping, dressing, reaching overhead and behind her back. Pt has difficulty with caring for granddaughter. Unable to perform lifting tasks, has difficulty with housekeeping, cooking, caregiving for 49 year old granddaughter.    FUNCTIONAL OUTCOME MEASURES: UEFS  Extreme difficulty/unable (0), Quite a bit of difficulty (1), Moderate difficulty (2), Little difficulty (3), No difficulty (4) Survey date:   03/04/24  Any of your usual work, household or school activities 1  2. Your usual hobbies, recreational/sport activities 0   3. Lifting a bag of groceries to waist level 0   4. Lifting a bag of groceries above your head 0  5. Grooming your hair 1  6. Pushing up on your hands (I.e. from bathtub or chair) 1  7. Preparing food (I.e. peeling/cutting) 1  8.  Driving  4  9. Vacuuming, sweeping, or raking 3  10. Dressing  1  11. Doing up buttons 2  12. Using tools/appliances 4  13. Opening doors 1  14. Cleaning  4  15. Tying or lacing shoes 4  16. Sleeping  1  17. Laundering clothes (I.e. washing, ironing, folding) 4  18. Opening a jar 0  19. Throwing a ball 0  20. Carrying a small suitcase with your affected limb.  1  Score total:  33      UPPER EXTREMITY ROM:       Assessed in sitting, er/IR adducted  Active ROM Right eval  Shoulder flexion 98  Shoulder abduction 70  Shoulder internal rotation 90  Shoulder external rotation 20  (Blank rows = not tested)    Assessed in supine, er/IR adducted  Passive ROM Right eval  Shoulder flexion 105  Shoulder abduction 75  Shoulder internal rotation 90  Shoulder external rotation 20  (Blank rows = not tested)   UPPER EXTREMITY MMT:     Assessed in sitting, er/IR adducted-observation due to pain  MMT  Right eval  Shoulder flexion 3-/5  Shoulder abduction 3-/5  Shoulder internal rotation 3/5  Shoulder external rotation 3-/5  (Blank rows = not tested)  HAND FUNCTION: Decreased due to recent thumb fracture, has HEP  SENSATION: WFL  EDEMA: None  COGNITION: Overall cognitive status: Within functional limits for tasks assessed  OBSERVATIONS: mod fascial restrictions throughout upper arm, anterior shoulder, trapezius, and scapular regions   TODAY'S TREATMENT:                                                                                                                              DATE:    03/17/24 -manual therapy: myofascial release and trigger point applied to biceps, scapular region, and trapezius in order to reduce pain and fascial restrictions in order to improve ROM -P/ROM: flexion, abduction, er/IR, x10 -AA/ROM: supine, protraction, flexion, abduction, horizontal abduction, er/IR, x10 -A/ROM: supine, flexion, abduction, protraction, horizontal abduction, er/IR,  x8 -Scapular Strengthening: red band, extension, retraction, rows, x10  03/11/24 -manual therapy: myofascial release and trigger point applied to biceps, scapular region, and trapezius in order to reduce pain and fascial restrictions in order to improve ROM -P/ROM: flexion, abduction, er/IR, x10 -AA/ROM: supine, protraction, flexion, abduction, horizontal abduction, er/IR, x10 -Pulleys: flexion, abduction, x60 -Wall Climbs: flexion, abduction, x10  03/09/24 -manual therapy: myofascial release and trigger point applied to biceps, scapular region, and trapezius in order to reduce pain and fascial restrictions in order to improve ROM -P/ROM: flexion, abduction, er/IR, x10 -AA/ROM: supine, protraction, flexion, abduction, horizontal abduction, er/IR, x10 -Scapular ROM: elevation/depression, rows, x10   PATIENT EDUCATION: Education details: Wall Climbs Person educated: Patient Education method: Explanation, Demonstration, and Handouts Education comprehension: verbalized understanding and returned demonstration  HOME EXERCISE PROGRAM: Eval: Scapular A/ROM, table slides 8/28: Wall Climbs  GOALS: Goals reviewed with patient? Yes   SHORT TERM GOALS: Target date: 04/04/24  Pt will be provided with and educated on HEP to improve mobility in RUE required for use during ADL completion.   Goal status: IN PROGRESS  2.  Pt will increase RUE P/ROM by 30+ degrees to improve ability to use RUE during dressing tasks with minimal compensatory techniques.   Goal status: IN PROGRESS  3.  Pt will increase RUE strength to 3+/5 to improve ability to reach for items at waist to chest height during bathing and grooming tasks.   Goal status: IN PROGRESS    LONG TERM GOALS: Target date: 05/04/24  Pt will decrease pain in RUE to 4/10 or less to improve ability to sleep for 2+ consecutive hours without waking due to pain.   Goal status: IN PROGRESS  2.  Pt will decrease RUE fascial restrictions to  min amounts or less to improve mobility required for functional reaching tasks.   Goal status: IN PROGRESS  3.  Pt will increase RUE A/ROM by 45+ degrees to improve ability to use RUE when reaching overhead or behind back during dressing and bathing tasks.  Goal status: IN PROGRESS  4.  Pt will increase RUE strength to 4/5 or greater to improve ability to use RUE when lifting or carrying items during meal preparation/housework/yardwork tasks.   Goal status: IN PROGRESS   ASSESSMENT:  CLINICAL IMPRESSION: Pt continuing to have improving ROM with pain decreasing per report. She is achieving 90% of full ROM with AA/ROM and 80% of full ROM with A/ROM. Pt also tolerated adding scapular strengthening this session for initiating strengthening with resistant bands. Verbal and tactile cuing provided for positioning and technique throughout session.   PERFORMANCE DEFICITS: in functional skills including in functional skills including ADLs, IADLs, coordination, tone, ROM, strength, pain, fascial restrictions, muscle spasms, and UE functional use   PLAN:  OT FREQUENCY: 2x/week  OT DURATION: 8 weeks  PLANNED INTERVENTIONS: 97168 OT Re-evaluation, 97535 self care/ADL training, 02889 therapeutic exercise, 97530 therapeutic activity, 97112 neuromuscular re-education, 97140 manual therapy, 97014 electrical stimulation unattended, patient/family education, and DME and/or AE instructions  RECOMMENDED OTHER SERVICES: None at this time  CONSULTED AND AGREED WITH PLAN OF CARE: Patient  PLAN FOR NEXT SESSION: Follow up on HEP, manual techniques, passive stretching, AA/ROM progressing to A/ROM, scapular mobility and stability work   [employer], OTR/L [phone] 03/17/2024, 10:20 AM

## 2024-03-17 NOTE — Telephone Encounter (Signed)
 Received auth from pt for the [facility] from [place]. IC patient. Advised that first, we have not received anything from the [place]. Second, chart notes do not have patient out of work, I explained to patient that Dr. Margrette did not take her oow, she stated she isn't working due to hand. I advised her that whomever took her oow would be the appropriate provider to complete forms. Patient expressed understanding.

## 2024-03-19 ENCOUNTER — Ambulatory Visit (HOSPITAL_COMMUNITY): Admitting: Occupational Therapy

## 2024-03-19 ENCOUNTER — Encounter (HOSPITAL_COMMUNITY): Payer: Self-pay | Admitting: Occupational Therapy

## 2024-03-19 DIAGNOSIS — R29898 Other symptoms and signs involving the musculoskeletal system: Secondary | ICD-10-CM

## 2024-03-19 DIAGNOSIS — M25511 Pain in right shoulder: Secondary | ICD-10-CM

## 2024-03-19 DIAGNOSIS — M25611 Stiffness of right shoulder, not elsewhere classified: Secondary | ICD-10-CM

## 2024-03-19 NOTE — Therapy (Signed)
 OUTPATIENT OCCUPATIONAL THERAPY ORTHO TREATMENT NOTE  Patient Name: Latoya Decker MRN: [id number] DOB:03/25/1961, 63 y.o., female Today's Date: 03/19/2024   END OF SESSION:  OT End of Session - 03/19/24 1023     Visit Number 5    Number of Visits 16    Date for OT Re-Evaluation 05/03/24   progress note 04/04/24   Authorization Type [employer]    Authorization Time Period no auth required; 30 visits combined PT/OT/ST    Authorization - Visit Number 5    Authorization - Number of Visits 30    OT Start Time [phone]    OT Stop Time 1020    OT Time Calculation (min) 41 min    Activity Tolerance Patient tolerated treatment well    Behavior During Therapy [facility] for tasks assessed/performed           Past Medical History:  Diagnosis Date   Anxiety    Depression    Diverticulosis    Duodenal ulcer 01/05/2010   EGD Dr Starling, h pylori gastritis, completed treatment   Duodenitis    Eosinophilic gastroenteritis JAN 2015   DEC 2014: NL CMP/GIARDIA Ag   Family hx of colon cancer    Mother & sister   Gastritis    hospitalized 6/23-6/25 2011    Headache(784.0)    migraines   Helicobacter pylori gastritis    Hypertension    Nondisplaced fracture of distal phalanx of right thumb with routine healing 10/17/17 11/13/2017   PUD (peptic ulcer disease)    Dr Claudene   S/P colonoscopy 12/27/2003   Dr Petra   S/P endoscopy 12/27/2003   Dr Monalisa superficial ulcers GEJ, duodenitis,gastritis   Past Surgical History:  Procedure Laterality Date   ABDOMINAL HYSTERECTOMY     bilateral tubal ligation  1989   BIOPSY  06/10/2011   SLF: mild gastritis, bx benign, sb bx benign    CHOLECYSTECTOMY  1997   aph-Smith   COLONOSCOPY  05/2011   SLF: multiple hyperplastic sessile polyps, scattered diverticulosis. next TCS 05/2016 ([facility] CRC)   COLONOSCOPY N/A 04/21/2017   Dr. Harvey: two 2-95mm colon polyps removed, diverticulosis, hemorrhoids. next TCS in 5 years.    COLONOSCOPY WITH  PROPOFOL  N/A 05/21/2022   Procedure: COLONOSCOPY WITH PROPOFOL ;  Surgeon: Cindie Carlin POUR, DO;  [facility];  Service: Endoscopy;  Laterality: N/A;  11:00 am   ESOPHAGOGASTRODUODENOSCOPY N/A 07/28/2013   EOS INOPHILIC GASTROENTERITIS   ESOPHAGOGASTRODUODENOSCOPY N/A 04/21/2017   Dr. Harvey: mild gastritis with H.pylori present, esophageal biopsies negative.    MOLE REMOVAL  09/09/2022   PARTIAL HYSTERECTOMY  2007   1 ovary removed   POLYPECTOMY  06/10/2011   Procedure: POLYPECTOMY;  Surgeon: Margo CHRISTELLA Harvey, MD;  Location: [facility];  Service: Endoscopy;;  Cecal polypectomy; Sigmoid colon polypectomies   POLYPECTOMY  05/21/2022   Procedure: POLYPECTOMY INTESTINAL;  Surgeon: Cindie Carlin POUR, DO;  [facility];  Service: Endoscopy;;   TUBAL LIGATION     Patient Active Problem List   Diagnosis Date Noted   Chronic right shoulder pain 02/18/2024   Acute pain of right shoulder 01/04/2024   Hyperlipidemia LDL goal <100 03/27/2023   Seasonal and perennial allergic rhinitis 10/04/2022   Allergic conjunctivitis of both eyes 10/04/2022   Allergy  with anaphylaxis due to food 10/04/2022   Type 2 diabetes mellitus with other specified complication (HCC) 09/25/2022   Pain of right thumb 09/19/2022   Multiple food allergies 06/17/2022   Multiple pigmented nevi  06/17/2022   Onychomycosis 05/20/2021   Essential hypertension 11/29/2020   Vitamin D  deficiency 07/04/2019   Helicobacter pylori gastritis 04/16/2018   Tubular adenoma of colon 01/02/2018   Enlarged skin mole 01/02/2018   GERD (gastroesophageal reflux disease) 03/14/2017   Elevated alkaline phosphatase level 10/16/2016   Migraine variant without intractability 08/31/2012   Family history of colon cancer 05/14/2011   Duodenal ulcer without hemorrhage or perforation and without obstruction 01/12/2010   Obesity (BMI 30.0-34.9) 03/30/2008    PCP: Dr. Rollene Pesa  REFERRING PROVIDER: Dr. Taft Minerva  ONSET DATE: ~May 2025  REFERRING DIAG: M75.01 (ICD-10-CM) - Adhesive capsulitis of right shoulder   THERAPY DIAG:  Acute pain of right shoulder  Stiffness of right shoulder, not elsewhere classified  Other symptoms and signs involving the musculoskeletal system  Rationale for Evaluation and Treatment: Rehabilitation  SUBJECTIVE:   SUBJECTIVE STATEMENT: S:   PERTINENT HISTORY: Pt is a 63 y/o female presenting with right shoulder adhesive capsulitis, pain and decreased functioning has been present for approximately 2 months. Pt received steroid injection on 02/27/24 with some mild relief.  PRECAUTIONS: None  WEIGHT BEARING RESTRICTIONS: No  PAIN:  Are you having pain? Yes: NPRS scale: 1/10 Pain location: posterior deltoid and scapular region Pain description: sharp Aggravating factors: moving wrong, laying on it Relieving factors: ice  FALLS: Has patient fallen in last 6 months? No  PLOF: Independent  PATIENT GOALS: To have less pain and more mobility  NEXT MD VISIT: 04/23/24  OBJECTIVE:   HAND DOMINANCE: Right  ADLs: Overall ADLs: Pt reports difficulty with sleeping, dressing, reaching overhead and behind her back. Pt has difficulty with caring for granddaughter. Unable to perform lifting tasks, has difficulty with housekeeping, cooking, caregiving for 17 year old granddaughter.    FUNCTIONAL OUTCOME MEASURES: UEFS  Extreme difficulty/unable (0), Quite a bit of difficulty (1), Moderate difficulty (2), Little difficulty (3), No difficulty (4) Survey date:   03/04/24  Any of your usual work, household or school activities 1  2. Your usual hobbies, recreational/sport activities 0   3. Lifting a bag of groceries to waist level 0   4. Lifting a bag of groceries above your head 0  5. Grooming your hair 1  6. Pushing up on your hands (I.e. from bathtub or chair) 1  7. Preparing food (I.e. peeling/cutting) 1  8. Driving  4  9. Vacuuming, sweeping, or raking  3  10. Dressing  1  11. Doing up buttons 2  12. Using tools/appliances 4  13. Opening doors 1  14. Cleaning  4  15. Tying or lacing shoes 4  16. Sleeping  1  17. Laundering clothes (I.e. washing, ironing, folding) 4  18. Opening a jar 0  19. Throwing a ball 0  20. Carrying a small suitcase with your affected limb.  1  Score total:  33      UPPER EXTREMITY ROM:       Assessed in sitting, er/IR adducted  Active ROM Right eval  Shoulder flexion 98  Shoulder abduction 70  Shoulder internal rotation 90  Shoulder external rotation 20  (Blank rows = not tested)    Assessed in supine, er/IR adducted  Passive ROM Right eval  Shoulder flexion 105  Shoulder abduction 75  Shoulder internal rotation 90  Shoulder external rotation 20  (Blank rows = not tested)   UPPER EXTREMITY MMT:     Assessed in sitting, er/IR adducted-observation due to pain  MMT Right eval  Shoulder  flexion 3-/5  Shoulder abduction 3-/5  Shoulder internal rotation 3/5  Shoulder external rotation 3-/5  (Blank rows = not tested)  HAND FUNCTION: Decreased due to recent thumb fracture, has HEP  SENSATION: WFL  EDEMA: None  COGNITION: Overall cognitive status: Within functional limits for tasks assessed  OBSERVATIONS: mod fascial restrictions throughout upper arm, anterior shoulder, trapezius, and scapular regions   TODAY'S TREATMENT:                                                                                                                              DATE:      03/19/24 -manual therapy: myofascial release and trigger point applied to biceps, scapular region, and trapezius in order to reduce pain and fascial restrictions in order to improve ROM  -P/ROM: flexion, abduction, er, horizontal abduction, 5 reps  -AA/ROM: supine-flexion, abduction, er/IR, 10 reps  -A/ROM: supine-protraction, horizontal abduction, 10 reps  -Proximal shoulder strengthening: supine-paddles, criss cross, circles  each direction, 10 reps  -Shoulder stretches: flexion, er, 3x10 holds  -AA/ROM: standing-flexion, abduction, er, 10 reps  -A/ROM: standing-protraction, horizontal abduction, 10 reps  -Wall wash: 1'   -Functional reaching: pt placing cones on top shelf of overhead cabinet in flexion, removing in abduction. 10 cones  -UBE: level 1, 2' forward 2' reverse, pace: 5.5  03/17/24 -manual therapy: myofascial release and trigger point applied to biceps, scapular region, and trapezius in order to reduce pain and fascial restrictions in order to improve ROM -P/ROM: flexion, abduction, er/IR, x10 -AA/ROM: supine, protraction, flexion, abduction, horizontal abduction, er/IR, x10 -A/ROM: supine, flexion, abduction, protraction, horizontal abduction, er/IR, x8 -Scapular Strengthening: red band, extension, retraction, rows, x10  03/11/24 -manual therapy: myofascial release and trigger point applied to biceps, scapular region, and trapezius in order to reduce pain and fascial restrictions in order to improve ROM -P/ROM: flexion, abduction, er/IR, x10 -AA/ROM: supine, protraction, flexion, abduction, horizontal abduction, er/IR, x10 -Pulleys: flexion, abduction, x60 -Wall Climbs: flexion, abduction, x10     PATIENT EDUCATION: Education details: Wall Climbs Person educated: Patient Education method: Explanation, Demonstration, and Handouts Education comprehension: verbalized understanding and returned demonstration  HOME EXERCISE PROGRAM: Eval: Scapular A/ROM, table slides 8/28: Wall Climbs  GOALS: Goals reviewed with patient? Yes   SHORT TERM GOALS: Target date: 04/04/24  Pt will be provided with and educated on HEP to improve mobility in RUE required for use during ADL completion.   Goal status: IN PROGRESS  2.  Pt will increase RUE P/ROM by 30+ degrees to improve ability to use RUE during dressing tasks with minimal compensatory techniques.   Goal status: IN PROGRESS  3.  Pt will  increase RUE strength to 3+/5 to improve ability to reach for items at waist to chest height during bathing and grooming tasks.   Goal status: IN PROGRESS    LONG TERM GOALS: Target date: 05/04/24  Pt will decrease pain in RUE to 4/10 or less to improve ability to sleep for  2+ consecutive hours without waking due to pain.   Goal status: IN PROGRESS  2.  Pt will decrease RUE fascial restrictions to min amounts or less to improve mobility required for functional reaching tasks.   Goal status: IN PROGRESS  3.  Pt will increase RUE A/ROM by 45+ degrees to improve ability to use RUE when reaching overhead or behind back during dressing and bathing tasks.   Goal status: IN PROGRESS  4.  Pt will increase RUE strength to 4/5 or greater to improve ability to use RUE when lifting or carrying items during meal preparation/housework/yardwork tasks.   Goal status: IN PROGRESS   ASSESSMENT:  CLINICAL IMPRESSION: Pt reports she is feeling pretty well, is completing HEP. Manual techniques completed with moderate muscle knot noted in trapezius and anterior shoulder. Pt progressing to A/ROM for some tasks, continued AA/ROM for more limited planes. Added shoulder stretches and functional reaching tasks. Pt with ROM at 75% for flexion and abduction, er limited to <50% range. Tasks focusing on postural training and shoulder mobility required for use of RUE during ADLs and functional tasks. Verbal cuing for form and technique.     PERFORMANCE DEFICITS: in functional skills including in functional skills including ADLs, IADLs, coordination, tone, ROM, strength, pain, fascial restrictions, muscle spasms, and UE functional use   PLAN:  OT FREQUENCY: 2x/week  OT DURATION: 8 weeks  PLANNED INTERVENTIONS: 97168 OT Re-evaluation, 97535 self care/ADL training, 02889 therapeutic exercise, 97530 therapeutic activity, 97112 neuromuscular re-education, 97140 manual therapy, 97014 electrical stimulation  unattended, patient/family education, and DME and/or AE instructions  RECOMMENDED OTHER SERVICES: None at this time  CONSULTED AND AGREED WITH PLAN OF CARE: Patient  PLAN FOR NEXT SESSION: Follow up on HEP, manual techniques, passive stretching, AA/ROM progressing to A/ROM, scapular mobility and stability work    [employer], OTR/L  [phone] 03/19/2024, 10:24 AM

## 2024-03-22 ENCOUNTER — Encounter (HOSPITAL_COMMUNITY): Payer: Self-pay | Admitting: Occupational Therapy

## 2024-03-22 ENCOUNTER — Ambulatory Visit (HOSPITAL_COMMUNITY): Admitting: Occupational Therapy

## 2024-03-22 DIAGNOSIS — M25511 Pain in right shoulder: Secondary | ICD-10-CM

## 2024-03-22 DIAGNOSIS — R29898 Other symptoms and signs involving the musculoskeletal system: Secondary | ICD-10-CM

## 2024-03-22 DIAGNOSIS — M25611 Stiffness of right shoulder, not elsewhere classified: Secondary | ICD-10-CM

## 2024-03-22 NOTE — Patient Instructions (Signed)

## 2024-03-22 NOTE — Therapy (Signed)
 OUTPATIENT OCCUPATIONAL THERAPY ORTHO TREATMENT NOTE  Patient Name: Latoya Decker MRN: [id number] DOB:14-May-1961, 63 y.o., female Today's Date: 03/22/2024   END OF SESSION:  OT End of Session - 03/22/24 1023     Visit Number 6    Number of Visits 16    Date for OT Re-Evaluation 05/03/24   progress note 04/04/24   Authorization Type [employer]    Authorization Time Period no auth required; 30 visits combined PT/OT/ST    Authorization - Visit Number 6    Authorization - Number of Visits 30    OT Start Time [phone]    OT Stop Time 1021    OT Time Calculation (min) 42 min    Activity Tolerance Patient tolerated treatment well    Behavior During Therapy [facility] for tasks assessed/performed          Past Medical History:  Diagnosis Date   Anxiety    Depression    Diverticulosis    Duodenal ulcer 01/05/2010   EGD Dr Starling, h pylori gastritis, completed treatment   Duodenitis    Eosinophilic gastroenteritis JAN 2015   DEC 2014: NL CMP/GIARDIA Ag   Family hx of colon cancer    Mother & sister   Gastritis    hospitalized 6/23-6/25 2011    Headache(784.0)    migraines   Helicobacter pylori gastritis    Hypertension    Nondisplaced fracture of distal phalanx of right thumb with routine healing 10/17/17 11/13/2017   PUD (peptic ulcer disease)    Dr Claudene   S/P colonoscopy 12/27/2003   Dr Petra   S/P endoscopy 12/27/2003   Dr Monalisa superficial ulcers GEJ, duodenitis,gastritis   Past Surgical History:  Procedure Laterality Date   ABDOMINAL HYSTERECTOMY     bilateral tubal ligation  1989   BIOPSY  06/10/2011   SLF: mild gastritis, bx benign, sb bx benign    CHOLECYSTECTOMY  1997   aph-Smith   COLONOSCOPY  05/2011   SLF: multiple hyperplastic sessile polyps, scattered diverticulosis. next TCS 05/2016 ([facility] CRC)   COLONOSCOPY N/A 04/21/2017   Dr. Harvey: two 2-63mm colon polyps removed, diverticulosis, hemorrhoids. next TCS in 5 years.    COLONOSCOPY WITH  PROPOFOL  N/A 05/21/2022   Procedure: COLONOSCOPY WITH PROPOFOL ;  Surgeon: Cindie Carlin POUR, DO;  [facility];  Service: Endoscopy;  Laterality: N/A;  11:00 am   ESOPHAGOGASTRODUODENOSCOPY N/A 07/28/2013   EOS INOPHILIC GASTROENTERITIS   ESOPHAGOGASTRODUODENOSCOPY N/A 04/21/2017   Dr. Harvey: mild gastritis with H.pylori present, esophageal biopsies negative.    MOLE REMOVAL  09/09/2022   PARTIAL HYSTERECTOMY  2007   1 ovary removed   POLYPECTOMY  06/10/2011   Procedure: POLYPECTOMY;  Surgeon: Margo CHRISTELLA Harvey, MD;  Location: [facility];  Service: Endoscopy;;  Cecal polypectomy; Sigmoid colon polypectomies   POLYPECTOMY  05/21/2022   Procedure: POLYPECTOMY INTESTINAL;  Surgeon: Cindie Carlin POUR, DO;  Location[facility];  Service: Endoscopy;;   TUBAL LIGATION     Patient Active Problem List   Diagnosis Date Noted   Chronic right shoulder pain 02/18/2024   Acute pain of right shoulder 01/04/2024   Hyperlipidemia LDL goal <100 03/27/2023   Seasonal and perennial allergic rhinitis 10/04/2022   Allergic conjunctivitis of both eyes 10/04/2022   Allergy  with anaphylaxis due to food 10/04/2022   Type 2 diabetes mellitus with other specified complication (HCC) 09/25/2022   Pain of right thumb 09/19/2022   Multiple food allergies 06/17/2022   Multiple pigmented nevi 06/17/2022  Onychomycosis 05/20/2021   Essential hypertension 11/29/2020   Vitamin D  deficiency 07/04/2019   Helicobacter pylori gastritis 04/16/2018   Tubular adenoma of colon 01/02/2018   Enlarged skin mole 01/02/2018   GERD (gastroesophageal reflux disease) 03/14/2017   Elevated alkaline phosphatase level 10/16/2016   Migraine variant without intractability 08/31/2012   Family history of colon cancer 05/14/2011   Duodenal ulcer without hemorrhage or perforation and without obstruction 01/12/2010   Obesity (BMI 30.0-34.9) 03/30/2008    PCP: Dr. Rollene Pesa  REFERRING PROVIDER: Dr. Taft Minerva  ONSET DATE: ~May 2025  REFERRING DIAG: M75.01 (ICD-10-CM) - Adhesive capsulitis of right shoulder   THERAPY DIAG:  Acute pain of right shoulder  Stiffness of right shoulder, not elsewhere classified  Other symptoms and signs involving the musculoskeletal system  Rationale for Evaluation and Treatment: Rehabilitation  SUBJECTIVE:   SUBJECTIVE STATEMENT: S:   PERTINENT HISTORY: Pt is a 63 y/o female presenting with right shoulder adhesive capsulitis, pain and decreased functioning has been present for approximately 2 months. Pt received steroid injection on 02/27/24 with some mild relief.  PRECAUTIONS: None  WEIGHT BEARING RESTRICTIONS: No  PAIN:  Are you having pain? Yes: NPRS scale: 1/10 Pain location: posterior deltoid and scapular region Pain description: sharp Aggravating factors: moving wrong, laying on it Relieving factors: ice  FALLS: Has patient fallen in last 6 months? No  PLOF: Independent  PATIENT GOALS: To have less pain and more mobility  NEXT MD VISIT: 04/23/24  OBJECTIVE:   HAND DOMINANCE: Right  ADLs: Overall ADLs: Pt reports difficulty with sleeping, dressing, reaching overhead and behind her back. Pt has difficulty with caring for granddaughter. Unable to perform lifting tasks, has difficulty with housekeeping, cooking, caregiving for 31 year old granddaughter.    FUNCTIONAL OUTCOME MEASURES: UEFS  Extreme difficulty/unable (0), Quite a bit of difficulty (1), Moderate difficulty (2), Little difficulty (3), No difficulty (4) Survey date:   03/04/24  Any of your usual work, household or school activities 1  2. Your usual hobbies, recreational/sport activities 0   3. Lifting a bag of groceries to waist level 0   4. Lifting a bag of groceries above your head 0  5. Grooming your hair 1  6. Pushing up on your hands (I.e. from bathtub or chair) 1  7. Preparing food (I.e. peeling/cutting) 1  8. Driving  4  9. Vacuuming, sweeping, or raking  3  10. Dressing  1  11. Doing up buttons 2  12. Using tools/appliances 4  13. Opening doors 1  14. Cleaning  4  15. Tying or lacing shoes 4  16. Sleeping  1  17. Laundering clothes (I.e. washing, ironing, folding) 4  18. Opening a jar 0  19. Throwing a ball 0  20. Carrying a small suitcase with your affected limb.  1  Score total:  33      UPPER EXTREMITY ROM:       Assessed in sitting, er/IR adducted  Active ROM Right eval  Shoulder flexion 98  Shoulder abduction 70  Shoulder internal rotation 90  Shoulder external rotation 20  (Blank rows = not tested)    Assessed in supine, er/IR adducted  Passive ROM Right eval  Shoulder flexion 105  Shoulder abduction 75  Shoulder internal rotation 90  Shoulder external rotation 20  (Blank rows = not tested)   UPPER EXTREMITY MMT:     Assessed in sitting, er/IR adducted-observation due to pain  MMT Right eval  Shoulder flexion 3-/5  Shoulder abduction 3-/5  Shoulder internal rotation 3/5  Shoulder external rotation 3-/5  (Blank rows = not tested)  HAND FUNCTION: Decreased due to recent thumb fracture, has HEP  SENSATION: WFL  EDEMA: None  COGNITION: Overall cognitive status: Within functional limits for tasks assessed  OBSERVATIONS: mod fascial restrictions throughout upper arm, anterior shoulder, trapezius, and scapular regions   TODAY'S TREATMENT:                                                                                                                              DATE:      03/22/24 -manual therapy: myofascial release and trigger point applied to biceps, scapular region, and trapezius in order to reduce pain and fascial restrictions in order to improve ROM  -P/ROM: flexion, abduction, er, horizontal abduction, x5  -AA/ROM: seated, flexion, protraction, abduction, horizontal abduction, er/IR, x10  -A/ROM: seated, flexion, protraction, abduction, horizontal abduction, er/IR, x10 -Wall Climbs: flexion,  abduction, x10 -ABC's on the wall, green ball  03/19/24 -manual therapy: myofascial release and trigger point applied to biceps, scapular region, and trapezius in order to reduce pain and fascial restrictions in order to improve ROM  -P/ROM: flexion, abduction, er, horizontal abduction, 5 reps  -AA/ROM: supine-flexion, abduction, er/IR, 10 reps  -A/ROM: supine-protraction, horizontal abduction, 10 reps  -Proximal shoulder strengthening: supine-paddles, criss cross, circles each direction, 10 reps  -Shoulder stretches: flexion, er, 3x10 holds  -AA/ROM: standing-flexion, abduction, er, 10 reps  -A/ROM: standing-protraction, horizontal abduction, 10 reps  -Wall wash: 1'  -Functional reaching: pt placing cones on top shelf of overhead cabinet in flexion, removing in abduction. 10 cones  -UBE: level 1, 2' forward 2' reverse, pace: 5.5  03/17/24 -manual therapy: myofascial release and trigger point applied to biceps, scapular region, and trapezius in order to reduce pain and fascial restrictions in order to improve ROM -P/ROM: flexion, abduction, er/IR, x10 -AA/ROM: supine, protraction, flexion, abduction, horizontal abduction, er/IR, x10 -A/ROM: supine, flexion, abduction, protraction, horizontal abduction, er/IR, x8 -Scapular Strengthening: red band, extension, retraction, rows, x10   PATIENT EDUCATION: Education details: Wall Climbs Person educated: Patient Education method: Explanation, Demonstration, and Handouts Education comprehension: verbalized understanding and returned demonstration  HOME EXERCISE PROGRAM: Eval: Scapular A/ROM, table slides 8/28: Wall Climbs 9/8: A/ROM  GOALS: Goals reviewed with patient? Yes   SHORT TERM GOALS: Target date: 04/04/24  Pt will be provided with and educated on HEP to improve mobility in RUE required for use during ADL completion.   Goal status: IN PROGRESS  2.  Pt will increase RUE P/ROM by 30+ degrees to improve ability to use RUE during  dressing tasks with minimal compensatory techniques.   Goal status: IN PROGRESS  3.  Pt will increase RUE strength to 3+/5 to improve ability to reach for items at waist to chest height during bathing and grooming tasks.   Goal status: IN PROGRESS    LONG TERM GOALS: Target date: 05/04/24  Pt will decrease pain  in RUE to 4/10 or less to improve ability to sleep for 2+ consecutive hours without waking due to pain.   Goal status: IN PROGRESS  2.  Pt will decrease RUE fascial restrictions to min amounts or less to improve mobility required for functional reaching tasks.   Goal status: IN PROGRESS  3.  Pt will increase RUE A/ROM by 45+ degrees to improve ability to use RUE when reaching overhead or behind back during dressing and bathing tasks.   Goal status: IN PROGRESS  4.  Pt will increase RUE strength to 4/5 or greater to improve ability to use RUE when lifting or carrying items during meal preparation/housework/yardwork tasks.   Goal status: IN PROGRESS   ASSESSMENT:  CLINICAL IMPRESSION: This session pt reported in the beginning that she had no pain. With increased exercises, her pain increased, however she is improving her ROM and endurance. Pt reports good follow through with her HEP, which OT added A/ROM to this session, as she demonstrated good form with that task. Verbal and tactile cuing provided for positioning and technique throughout session.    PERFORMANCE DEFICITS: in functional skills including in functional skills including ADLs, IADLs, coordination, tone, ROM, strength, pain, fascial restrictions, muscle spasms, and UE functional use   PLAN:  OT FREQUENCY: 2x/week  OT DURATION: 8 weeks  PLANNED INTERVENTIONS: 97168 OT Re-evaluation, 97535 self care/ADL training, 02889 therapeutic exercise, 97530 therapeutic activity, 97112 neuromuscular re-education, 97140 manual therapy, 97014 electrical stimulation unattended, patient/family education, and DME and/or AE  instructions  RECOMMENDED OTHER SERVICES: None at this time  CONSULTED AND AGREED WITH PLAN OF CARE: Patient  PLAN FOR NEXT SESSION: Follow up on HEP, manual techniques, passive stretching, AA/ROM progressing to A/ROM, scapular mobility and stability work    [employer], OTR/L [phone] 03/22/2024, 10:24 AM

## 2024-03-24 ENCOUNTER — Encounter (HOSPITAL_COMMUNITY): Payer: Self-pay | Admitting: Occupational Therapy

## 2024-03-24 ENCOUNTER — Encounter: Payer: Self-pay | Admitting: Gastroenterology

## 2024-03-24 ENCOUNTER — Ambulatory Visit (HOSPITAL_COMMUNITY): Admitting: Occupational Therapy

## 2024-03-24 DIAGNOSIS — R29898 Other symptoms and signs involving the musculoskeletal system: Secondary | ICD-10-CM

## 2024-03-24 DIAGNOSIS — M25511 Pain in right shoulder: Secondary | ICD-10-CM

## 2024-03-24 DIAGNOSIS — M25611 Stiffness of right shoulder, not elsewhere classified: Secondary | ICD-10-CM

## 2024-03-24 NOTE — Therapy (Signed)
 OUTPATIENT OCCUPATIONAL THERAPY ORTHO TREATMENT NOTE  Patient Name: Latoya Decker MRN: [id number] DOB:30-Nov-1960, 63 y.o., female Today's Date: 03/24/2024   END OF SESSION:  OT End of Session - 03/24/24 1010     Visit Number 7    Number of Visits 16    Date for OT Re-Evaluation 05/03/24   progress note 04/04/24   Authorization Type [employer]    Authorization Time Period no auth required; 30 visits combined PT/OT/ST    Authorization - Visit Number 7    Authorization - Number of Visits 30    OT Start Time [phone]    OT Stop Time 1010    OT Time Calculation (min) 43 min    Activity Tolerance Patient tolerated treatment well    Behavior During Therapy [facility] for tasks assessed/performed          Past Medical History:  Diagnosis Date   Anxiety    Depression    Diverticulosis    Duodenal ulcer 01/05/2010   EGD Dr Starling, h pylori gastritis, completed treatment   Duodenitis    Eosinophilic gastroenteritis JAN 2015   DEC 2014: NL CMP/GIARDIA Ag   Family hx of colon cancer    Mother & sister   Gastritis    hospitalized 6/23-6/25 2011    Headache(784.0)    migraines   Helicobacter pylori gastritis    Hypertension    Nondisplaced fracture of distal phalanx of right thumb with routine healing 10/17/17 11/13/2017   PUD (peptic ulcer disease)    Dr Claudene   S/P colonoscopy 12/27/2003   Dr Petra   S/P endoscopy 12/27/2003   Dr Monalisa superficial ulcers GEJ, duodenitis,gastritis   Past Surgical History:  Procedure Laterality Date   ABDOMINAL HYSTERECTOMY     bilateral tubal ligation  1989   BIOPSY  06/10/2011   SLF: mild gastritis, bx benign, sb bx benign    CHOLECYSTECTOMY  1997   aph-Smith   COLONOSCOPY  05/2011   SLF: multiple hyperplastic sessile polyps, scattered diverticulosis. next TCS 05/2016 ([facility] CRC)   COLONOSCOPY N/A 04/21/2017   Dr. Harvey: two 2-41mm colon polyps removed, diverticulosis, hemorrhoids. next TCS in 5 years.    COLONOSCOPY WITH  PROPOFOL  N/A 05/21/2022   Procedure: COLONOSCOPY WITH PROPOFOL ;  Surgeon: Cindie Carlin POUR, DO;  [facility];  Service: Endoscopy;  Laterality: N/A;  11:00 am   ESOPHAGOGASTRODUODENOSCOPY N/A 07/28/2013   EOS INOPHILIC GASTROENTERITIS   ESOPHAGOGASTRODUODENOSCOPY N/A 04/21/2017   Dr. Harvey: mild gastritis with H.pylori present, esophageal biopsies negative.    MOLE REMOVAL  09/09/2022   PARTIAL HYSTERECTOMY  2007   1 ovary removed   POLYPECTOMY  06/10/2011   Procedure: POLYPECTOMY;  Surgeon: Margo CHRISTELLA Harvey, MD;  Location: [facility];  Service: Endoscopy;;  Cecal polypectomy; Sigmoid colon polypectomies   POLYPECTOMY  05/21/2022   Procedure: POLYPECTOMY INTESTINAL;  Surgeon: Cindie Carlin POUR, DO;  [facility];  Service: Endoscopy;;   TUBAL LIGATION     Patient Active Problem List   Diagnosis Date Noted   Chronic right shoulder pain 02/18/2024   Acute pain of right shoulder 01/04/2024   Hyperlipidemia LDL goal <100 03/27/2023   Seasonal and perennial allergic rhinitis 10/04/2022   Allergic conjunctivitis of both eyes 10/04/2022   Allergy  with anaphylaxis due to food 10/04/2022   Type 2 diabetes mellitus with other specified complication (HCC) 09/25/2022   Pain of right thumb 09/19/2022   Multiple food allergies 06/17/2022   Multiple pigmented nevi 06/17/2022  Onychomycosis 05/20/2021   Essential hypertension 11/29/2020   Vitamin D  deficiency 07/04/2019   Helicobacter pylori gastritis 04/16/2018   Tubular adenoma of colon 01/02/2018   Enlarged skin mole 01/02/2018   GERD (gastroesophageal reflux disease) 03/14/2017   Elevated alkaline phosphatase level 10/16/2016   Migraine variant without intractability 08/31/2012   Family history of colon cancer 05/14/2011   Duodenal ulcer without hemorrhage or perforation and without obstruction 01/12/2010   Obesity (BMI 30.0-34.9) 03/30/2008    PCP: Dr. Rollene Pesa  REFERRING PROVIDER: Dr. Taft Minerva  ONSET DATE: ~May 2025  REFERRING DIAG: M75.01 (ICD-10-CM) - Adhesive capsulitis of right shoulder   THERAPY DIAG:  Acute pain of right shoulder  Stiffness of right shoulder, not elsewhere classified  Other symptoms and signs involving the musculoskeletal system  Rationale for Evaluation and Treatment: Rehabilitation  SUBJECTIVE:   SUBJECTIVE STATEMENT: S: It has really been hurting this morning.  PERTINENT HISTORY: Pt is a 63 y/o female presenting with right shoulder adhesive capsulitis, pain and decreased functioning has been present for approximately 2 months. Pt received steroid injection on 02/27/24 with some mild relief.  PRECAUTIONS: None  WEIGHT BEARING RESTRICTIONS: No  PAIN:  Are you having pain? Yes: NPRS scale: 7/10 Pain location: posterior deltoid and scapular region Pain description: sharp Aggravating factors: moving wrong, laying on it Relieving factors: ice  FALLS: Has patient fallen in last 6 months? No  PLOF: Independent  PATIENT GOALS: To have less pain and more mobility  NEXT MD VISIT: 04/23/24  OBJECTIVE:   HAND DOMINANCE: Right  ADLs: Overall ADLs: Pt reports difficulty with sleeping, dressing, reaching overhead and behind her back. Pt has difficulty with caring for granddaughter. Unable to perform lifting tasks, has difficulty with housekeeping, cooking, caregiving for 107 year old granddaughter.    FUNCTIONAL OUTCOME MEASURES: UEFS  Extreme difficulty/unable (0), Quite a bit of difficulty (1), Moderate difficulty (2), Little difficulty (3), No difficulty (4) Survey date:   03/04/24  Any of your usual work, household or school activities 1  2. Your usual hobbies, recreational/sport activities 0   3. Lifting a bag of groceries to waist level 0   4. Lifting a bag of groceries above your head 0  5. Grooming your hair 1  6. Pushing up on your hands (I.e. from bathtub or chair) 1  7. Preparing food (I.e. peeling/cutting) 1  8.  Driving  4  9. Vacuuming, sweeping, or raking 3  10. Dressing  1  11. Doing up buttons 2  12. Using tools/appliances 4  13. Opening doors 1  14. Cleaning  4  15. Tying or lacing shoes 4  16. Sleeping  1  17. Laundering clothes (I.e. washing, ironing, folding) 4  18. Opening a jar 0  19. Throwing a ball 0  20. Carrying a small suitcase with your affected limb.  1  Score total:  33      UPPER EXTREMITY ROM:       Assessed in sitting, er/IR adducted  Active ROM Right eval  Shoulder flexion 98  Shoulder abduction 70  Shoulder internal rotation 90  Shoulder external rotation 20  (Blank rows = not tested)    Assessed in supine, er/IR adducted  Passive ROM Right eval  Shoulder flexion 105  Shoulder abduction 75  Shoulder internal rotation 90  Shoulder external rotation 20  (Blank rows = not tested)   UPPER EXTREMITY MMT:     Assessed in sitting, er/IR adducted-observation due to pain  MMT Right  eval  Shoulder flexion 3-/5  Shoulder abduction 3-/5  Shoulder internal rotation 3/5  Shoulder external rotation 3-/5  (Blank rows = not tested)  HAND FUNCTION: Decreased due to recent thumb fracture, has HEP  SENSATION: WFL  EDEMA: None  COGNITION: Overall cognitive status: Within functional limits for tasks assessed  OBSERVATIONS: mod fascial restrictions throughout upper arm, anterior shoulder, trapezius, and scapular regions   TODAY'S TREATMENT:                                                                                                                              DATE:      03/24/24 -manual therapy: myofascial release and trigger point applied to biceps, scapular region, and trapezius in order to reduce pain and fascial restrictions in order to improve ROM  -P/ROM: flexion, abduction, er, horizontal abduction, x5  -AA/ROM: seated, flexion, protraction, abduction, horizontal abduction, er/IR, x12  -A/ROM: seated, flexion, protraction, abduction,  horizontal abduction, er/IR, x10  -Functional Reaching: counter to 1st shelf to 2nd shelf, back down in abduction, 10 cones  -UBE: level 1, 2.5' forwards and backwards  03/22/24 -manual therapy: myofascial release and trigger point applied to biceps, scapular region, and trapezius in order to reduce pain and fascial restrictions in order to improve ROM  -P/ROM: flexion, abduction, er, horizontal abduction, x5  -AA/ROM: seated, flexion, protraction, abduction, horizontal abduction, er/IR, x10  -A/ROM: seated, flexion, protraction, abduction, horizontal abduction, er/IR, x10 -Wall Climbs: flexion, abduction, x10 -ABC's on the wall, green ball  03/19/24 -manual therapy: myofascial release and trigger point applied to biceps, scapular region, and trapezius in order to reduce pain and fascial restrictions in order to improve ROM  -P/ROM: flexion, abduction, er, horizontal abduction, 5 reps  -AA/ROM: supine-flexion, abduction, er/IR, 10 reps  -A/ROM: supine-protraction, horizontal abduction, 10 reps  -Proximal shoulder strengthening: supine-paddles, criss cross, circles each direction, 10 reps  -Shoulder stretches: flexion, er, 3x10 holds  -AA/ROM: standing-flexion, abduction, er, 10 reps  -A/ROM: standing-protraction, horizontal abduction, 10 reps  -Wall wash: 1'  -Functional reaching: pt placing cones on top shelf of overhead cabinet in flexion, removing in abduction. 10 cones  -UBE: level 1, 2' forward 2' reverse, pace: 5.5    PATIENT EDUCATION: Education details: Continue HEP Person educated: Patient Education method: Explanation, Demonstration, and Handouts Education comprehension: verbalized understanding and returned demonstration  HOME EXERCISE PROGRAM: Eval: Scapular A/ROM, table slides 8/28: Wall Climbs 9/8: A/ROM  GOALS: Goals reviewed with patient? Yes   SHORT TERM GOALS: Target date: 04/04/24  Pt will be provided with and educated on HEP to improve mobility in RUE  required for use during ADL completion.   Goal status: IN PROGRESS  2.  Pt will increase RUE P/ROM by 30+ degrees to improve ability to use RUE during dressing tasks with minimal compensatory techniques.   Goal status: IN PROGRESS  3.  Pt will increase RUE strength to 3+/5 to improve ability to reach for items at waist  to chest height during bathing and grooming tasks.   Goal status: IN PROGRESS    LONG TERM GOALS: Target date: 05/04/24  Pt will decrease pain in RUE to 4/10 or less to improve ability to sleep for 2+ consecutive hours without waking due to pain.   Goal status: IN PROGRESS  2.  Pt will decrease RUE fascial restrictions to min amounts or less to improve mobility required for functional reaching tasks.   Goal status: IN PROGRESS  3.  Pt will increase RUE A/ROM by 45+ degrees to improve ability to use RUE when reaching overhead or behind back during dressing and bathing tasks.   Goal status: IN PROGRESS  4.  Pt will increase RUE strength to 4/5 or greater to improve ability to use RUE when lifting or carrying items during meal preparation/housework/yardwork tasks.   Goal status: IN PROGRESS   ASSESSMENT:  CLINICAL IMPRESSION: This session pt demonstrating increased stiffness and pain in her upper arm. She had limited ROM this session with passive, assisted, and active mobility. Overall she was reaching approximately 60% of full ROM this session and had moderate to severe pain with all mobility. OT providing verbal and tactile cuing for positioning and technique throughout session.    PERFORMANCE DEFICITS: in functional skills including in functional skills including ADLs, IADLs, coordination, tone, ROM, strength, pain, fascial restrictions, muscle spasms, and UE functional use   PLAN:  OT FREQUENCY: 2x/week  OT DURATION: 8 weeks  PLANNED INTERVENTIONS: 97168 OT Re-evaluation, 97535 self care/ADL training, 02889 therapeutic exercise, 97530 therapeutic  activity, 97112 neuromuscular re-education, 97140 manual therapy, 97014 electrical stimulation unattended, patient/family education, and DME and/or AE instructions  RECOMMENDED OTHER SERVICES: None at this time  CONSULTED AND AGREED WITH PLAN OF CARE: Patient  PLAN FOR NEXT SESSION: Follow up on HEP, manual techniques, passive stretching, AA/ROM progressing to A/ROM, scapular mobility and stability work    [employer], OTR/L [phone] 03/24/2024, 1:25 PM

## 2024-03-25 ENCOUNTER — Telehealth: Payer: Self-pay | Admitting: Family Medicine

## 2024-03-25 NOTE — Telephone Encounter (Signed)
 [facility] Noted Copied Scanned Original in provider box Copy at front desk

## 2024-03-29 ENCOUNTER — Encounter (HOSPITAL_COMMUNITY): Admitting: Occupational Therapy

## 2024-03-29 NOTE — Telephone Encounter (Signed)
 Per 08/13 note, this needs to be completed by Ortho which pt has already been told. Placing back up front

## 2024-04-01 ENCOUNTER — Encounter (HOSPITAL_COMMUNITY): Admitting: Occupational Therapy

## 2024-04-04 NOTE — Progress Notes (Unsigned)
 GI Office Note    Referring Provider: Antonetta Rollene BRAVO, MD Primary Care Physician:  Antonetta Rollene BRAVO, MD  Primary Gastroenterologist: Carlin POUR. Cindie, DO   Chief Complaint   No chief complaint on file.   History of Present Illness   Latoya Decker is a 63 y.o. female presenting today for follow up of chronically elevated alkaline phosphatase.   Extensive work up previously. GGT 62, AMA negative, anti mitochondrial M2 Ab negative, anti-SP-100 Ab negative, Anti-GP-210 Ab negative, AP isoenzymes 59% liver, 41% bone, ANA positive, Smooth muscle Ab positive.    MRI/MRCP 04/12/2023 showed hepatomegaly and hepatic steatosis, status post cholecystectomy, no biliary ductal dilatation.  Cardiomegaly.   Liver biopsy 06/26/2023 with minimally active steatohepatitis without fibrosis.   Risk factors for MASH include obesity, dyslipidemia, diabetes, hypertension.  Her alkaline phophatase has been in the 192-204 range since 2022, currently 196.    Prior Data        Medications   Current Outpatient Medications  Medication Sig Dispense Refill   amLODipine -olmesartan  (AZOR ) 5-20 MG tablet Take 1 tablet by mouth daily. 90 tablet 3   Blood Glucose Monitoring Suppl DEVI 1 each by Does not apply route in the morning, at noon, and at bedtime. May substitute to any manufacturer covered by patient's insurance. 1 each 0   cetirizine  (ZYRTEC  ALLERGY ) 10 MG tablet Take 1 tablet (10 mg total) by mouth daily as needed for allergies or rhinitis. 30 tablet 5   Lancets (ONETOUCH DELICA PLUS LANCET33G) MISC USE IN THE MORNING, AT NOON, AND AT BEDTIME. MAY SUBSTITUTE 100 each 1   levocetirizine (XYZAL ) 5 MG tablet TAKE 1 TABLET BY MOUTH EVERY DAY IN THE EVENING 90 tablet 1   metFORMIN  (GLUCOPHAGE ) 500 MG tablet Take 1 tablet (500 mg total) by mouth 2 (two) times daily with a meal. 180 tablet 3   pantoprazole  (PROTONIX ) 40 MG tablet Take 1 tablet (40 mg total) by mouth 2 (two) times daily.  60 tablet 11   Vitamin D , Ergocalciferol , (DRISDOL ) 1.25 MG (50000 UNIT) CAPS capsule Take 1 capsule (50,000 Units total) by mouth every 7 (seven) days. 5 capsule 5   No current facility-administered medications for this visit.    Allergies   Allergies as of 04/05/2024 - Review Complete 03/24/2024  Allergen Reaction Noted   Codeine  Nausea And Vomiting 06/30/2008   Hydrocodone Nausea And Vomiting 01/12/2010   Lidocaine  viscous hcl  08/25/2013     Past Medical History   Past Medical History:  Diagnosis Date   Anxiety    Depression    Diverticulosis    Duodenal ulcer 01/05/2010   EGD Dr Starling, h pylori gastritis, completed treatment   Duodenitis    Eosinophilic gastroenteritis JAN 2015   DEC 2014: NL CMP/GIARDIA Ag   Family hx of colon cancer    Mother & sister   Gastritis    hospitalized 6/23-6/25 2011    Headache(784.0)    migraines   Helicobacter pylori gastritis    Hypertension    Nondisplaced fracture of distal phalanx of right thumb with routine healing 10/17/17 11/13/2017   PUD (peptic ulcer disease)    Dr Claudene   S/P colonoscopy 12/27/2003   Dr Petra   S/P endoscopy 12/27/2003   Dr Monalisa superficial ulcers GEJ, duodenitis,gastritis    Past Surgical History   Past Surgical History:  Procedure Laterality Date   ABDOMINAL HYSTERECTOMY     bilateral tubal ligation  1989   BIOPSY  06/10/2011  SLF: mild gastritis, bx benign, sb bx benign    CHOLECYSTECTOMY  1997   aph-Smith   COLONOSCOPY  05/2011   SLF: multiple hyperplastic sessile polyps, scattered diverticulosis. next TCS 05/2016 ([facility] CRC)   COLONOSCOPY N/A 04/21/2017   Dr. Harvey: two 2-81mm colon polyps removed, diverticulosis, hemorrhoids. next TCS in 5 years.    COLONOSCOPY WITH PROPOFOL  N/A 05/21/2022   Procedure: COLONOSCOPY WITH PROPOFOL ;  Surgeon: Cindie Carlin POUR, DO;  [facility];  Service: Endoscopy;  Laterality: N/A;  11:00 am   ESOPHAGOGASTRODUODENOSCOPY N/A 07/28/2013    EOS INOPHILIC GASTROENTERITIS   ESOPHAGOGASTRODUODENOSCOPY N/A 04/21/2017   Dr. Harvey: mild gastritis with H.pylori present, esophageal biopsies negative.    MOLE REMOVAL  09/09/2022   PARTIAL HYSTERECTOMY  2007   1 ovary removed   POLYPECTOMY  06/10/2011   Procedure: POLYPECTOMY;  Surgeon: Margo CHRISTELLA Harvey, MD;  Location: [facility];  Service: Endoscopy;;  Cecal polypectomy; Sigmoid colon polypectomies   POLYPECTOMY  05/21/2022   Procedure: POLYPECTOMY INTESTINAL;  Surgeon: Cindie Carlin POUR, DO;  Location: [facility];  Service: Endoscopy;;   TUBAL LIGATION      Past Family History   Family History  Problem Relation Age of Onset   Colon cancer Mother 4   Diabetes Mother    Hypertension Mother    Stroke Mother    Heart attack Father    Diabetes Father    Hypertension Father    Colon cancer Sister 48   Lupus Brother    Parkinsonism Brother    Anesthesia problems Neg Hx    Hypotension Neg Hx    Malignant hyperthermia Neg Hx    Pseudochol deficiency Neg Hx     Past Social History   Social History   Socioeconomic History   Marital status: Married    Spouse name: Arley   Number of children: 3   Years of education: College   Highest education level: Not on file  Occupational History   Occupation: Dialysis Tech    Employer: [employer]    Employer: [employer]  Tobacco Use   Smoking status: Never    Passive exposure: Past   Smokeless tobacco: Never   Tobacco comments:    Never smoker  Vaping Use   Vaping status: Never Used  Substance and Sexual Activity   Alcohol use: No   Drug use: No   Sexual activity: Yes    Partners: Male    Birth control/protection: Surgical  Other Topics Concern   Not on file  Social History Narrative   Patient is married Latoya Decker) and lives at home with her husband and her granddaughter.   Patient has three adult children.   Patient works full-time.   Patient has a college education.   Patient is right-handed.   Patient drinks one  cup of tea daily.   Social Drivers of Corporate investment banker Strain: Not on file  Food Insecurity: Low Risk  (09/23/2023)   Received from [facility]   Hunger Vital Sign    Within the past 12 months, you worried that your food would run out before you got money to buy more: Never true    Within the past 12 months, the food you bought just didn't last and you didn't have money to get more. : Never true  Recent Concern: Food Insecurity - High Risk (07/15/2023)   Received from [facility]   Hunger Vital Sign    Worried About Running Out of Food in  the Last Year: Often true    Ran Out of Food in the Last Year: Often true  Transportation Needs: No Transportation Needs (09/23/2023)   Received from [facility]    In the past 12 months, has lack of reliable transportation kept you from medical appointments, meetings, work or from getting things needed for daily living? : No  Physical Activity: Not on file  Stress: Not on file  Social Connections: Not on file  Intimate Partner Violence: Not on file    Review of Systems   General: Negative for anorexia, weight loss, fever, chills, fatigue, weakness. ENT: Negative for hoarseness, difficulty swallowing , nasal congestion. CV: Negative for chest pain, angina, palpitations, dyspnea on exertion, peripheral edema.  Respiratory: Negative for dyspnea at rest, dyspnea on exertion, cough, sputum, wheezing.  GI: See history of present illness. GU:  Negative for dysuria, hematuria, urinary incontinence, urinary frequency, nocturnal urination.  Endo: Negative for unusual weight change.     Physical Exam   There were no vitals taken for this visit.   General: Well-nourished, well-developed in no acute distress.  Eyes: No icterus. Mouth: Oropharyngeal mucosa moist and pink   Lungs: Clear to auscultation bilaterally.  Heart: Regular rate and rhythm, no murmurs rubs or gallops.  Abdomen: Bowel sounds are normal, nontender,  nondistended, no hepatosplenomegaly or masses,  no abdominal bruits or hernia , no rebound or guarding.  Rectal: not performed Extremities: No lower extremity edema. No clubbing or deformities. Neuro: Alert and oriented x 4   Skin: Warm and dry, no jaundice.   Psych: Alert and cooperative, normal mood and affect.  Labs   *** Imaging Studies   No results found.  Assessment/Plan:     ?hepatology [place]?      Sonny RAMAN. Ezzard, MHS, PA-C [facility]

## 2024-04-05 ENCOUNTER — Ambulatory Visit (HOSPITAL_COMMUNITY): Admitting: Occupational Therapy

## 2024-04-05 ENCOUNTER — Encounter (HOSPITAL_COMMUNITY): Payer: Self-pay | Admitting: Occupational Therapy

## 2024-04-05 ENCOUNTER — Ambulatory Visit (INDEPENDENT_AMBULATORY_CARE_PROVIDER_SITE_OTHER): Admitting: Gastroenterology

## 2024-04-05 ENCOUNTER — Encounter: Payer: Self-pay | Admitting: Gastroenterology

## 2024-04-05 VITALS — BP 129/75 | HR 103 | Temp 98.7°F | Ht 66.0 in | Wt 206.2 lb

## 2024-04-05 DIAGNOSIS — M25511 Pain in right shoulder: Secondary | ICD-10-CM

## 2024-04-05 DIAGNOSIS — K7581 Nonalcoholic steatohepatitis (NASH): Secondary | ICD-10-CM | POA: Insufficient documentation

## 2024-04-05 DIAGNOSIS — M25611 Stiffness of right shoulder, not elsewhere classified: Secondary | ICD-10-CM

## 2024-04-05 DIAGNOSIS — R748 Abnormal levels of other serum enzymes: Secondary | ICD-10-CM

## 2024-04-05 DIAGNOSIS — R29898 Other symptoms and signs involving the musculoskeletal system: Secondary | ICD-10-CM

## 2024-04-05 NOTE — Therapy (Signed)
 OUTPATIENT OCCUPATIONAL THERAPY ORTHO TREATMENT NOTE  Patient Name: Latoya Decker MRN: [id number] DOB:03-Oct-1960, 63 y.o., female Today's Date: 04/05/2024   END OF SESSION:  OT End of Session - 04/05/24 0952     Visit Number 8    Number of Visits 16    Date for Recertification  05/03/24   progress note 04/04/24   Authorization Type [employer]    Authorization Time Period no auth required; 30 visits combined PT/OT/ST    Authorization - Visit Number 8    Authorization - Number of Visits 30    OT Start Time 0859    OT Stop Time [phone]    OT Time Calculation (min) 43 min    Activity Tolerance Patient tolerated treatment well    Behavior During Therapy [facility] for tasks assessed/performed          Past Medical History:  Diagnosis Date   Anxiety    Depression    Diverticulosis    Duodenal ulcer 01/05/2010   EGD Dr Starling, h pylori gastritis, completed treatment   Duodenitis    Eosinophilic gastroenteritis JAN 2015   DEC 2014: NL CMP/GIARDIA Ag   Family hx of colon cancer    Mother & sister   Gastritis    hospitalized 6/23-6/25 2011    Headache(784.0)    migraines   Helicobacter pylori gastritis    Hypertension    Nondisplaced fracture of distal phalanx of right thumb with routine healing 10/17/17 11/13/2017   PUD (peptic ulcer disease)    Dr Claudene   S/P colonoscopy 12/27/2003   Dr Petra   S/P endoscopy 12/27/2003   Dr Monalisa superficial ulcers GEJ, duodenitis,gastritis   Past Surgical History:  Procedure Laterality Date   ABDOMINAL HYSTERECTOMY     bilateral tubal ligation  1989   BIOPSY  06/10/2011   SLF: mild gastritis, bx benign, sb bx benign    CHOLECYSTECTOMY  1997   aph-Smith   COLONOSCOPY  05/2011   SLF: multiple hyperplastic sessile polyps, scattered diverticulosis. next TCS 05/2016 ([facility] CRC)   COLONOSCOPY N/A 04/21/2017   Dr. Harvey: two 2-63mm colon polyps removed, diverticulosis, hemorrhoids. next TCS in 5 years.    COLONOSCOPY WITH  PROPOFOL  N/A 05/21/2022   Procedure: COLONOSCOPY WITH PROPOFOL ;  Surgeon: Cindie Carlin POUR, DO;  [facility];  Service: Endoscopy;  Laterality: N/A;  11:00 am   ESOPHAGOGASTRODUODENOSCOPY N/A 07/28/2013   EOS INOPHILIC GASTROENTERITIS   ESOPHAGOGASTRODUODENOSCOPY N/A 04/21/2017   Dr. Harvey: mild gastritis with H.pylori present, esophageal biopsies negative.    MOLE REMOVAL  09/09/2022   PARTIAL HYSTERECTOMY  2007   1 ovary removed   POLYPECTOMY  06/10/2011   Procedure: POLYPECTOMY;  Surgeon: Margo CHRISTELLA Harvey, MD;  Location: [facility];  Service: Endoscopy;;  Cecal polypectomy; Sigmoid colon polypectomies   POLYPECTOMY  05/21/2022   Procedure: POLYPECTOMY INTESTINAL;  Surgeon: Cindie Carlin POUR, DO;  [facility];  Service: Endoscopy;;   TUBAL LIGATION     Patient Active Problem List   Diagnosis Date Noted   Chronic right shoulder pain 02/18/2024   Acute pain of right shoulder 01/04/2024   Hyperlipidemia LDL goal <100 03/27/2023   Seasonal and perennial allergic rhinitis 10/04/2022   Allergic conjunctivitis of both eyes 10/04/2022   Allergy  with anaphylaxis due to food 10/04/2022   Type 2 diabetes mellitus with other specified complication (HCC) 09/25/2022   Pain of right thumb 09/19/2022   Multiple food allergies 06/17/2022   Multiple pigmented nevi 06/17/2022  Onychomycosis 05/20/2021   Essential hypertension 11/29/2020   Vitamin D  deficiency 07/04/2019   Helicobacter pylori gastritis 04/16/2018   Tubular adenoma of colon 01/02/2018   Enlarged skin mole 01/02/2018   GERD (gastroesophageal reflux disease) 03/14/2017   Elevated alkaline phosphatase level 10/16/2016   Migraine variant without intractability 08/31/2012   Family history of colon cancer 05/14/2011   Duodenal ulcer without hemorrhage or perforation and without obstruction 01/12/2010   Obesity (BMI 30.0-34.9) 03/30/2008    PCP: Dr. Rollene Pesa  REFERRING PROVIDER: Dr. Taft Minerva  ONSET DATE: ~May 2025  REFERRING DIAG: M75.01 (ICD-10-CM) - Adhesive capsulitis of right shoulder   THERAPY DIAG:  Acute pain of right shoulder  Stiffness of right shoulder, not elsewhere classified  Other symptoms and signs involving the musculoskeletal system  Rationale for Evaluation and Treatment: Rehabilitation  SUBJECTIVE:   SUBJECTIVE STATEMENT: S: I've been stretching it and moving it more.  PERTINENT HISTORY: Pt is a 63 y/o female presenting with right shoulder adhesive capsulitis, pain and decreased functioning has been present for approximately 2 months. Pt received steroid injection on 02/27/24 with some mild relief.  PRECAUTIONS: None  WEIGHT BEARING RESTRICTIONS: No  PAIN:  Are you having pain? Yes: NPRS scale: 5/10 Pain location: posterior deltoid and scapular region Pain description: sharp Aggravating factors: moving wrong, laying on it Relieving factors: ice  FALLS: Has patient fallen in last 6 months? No  PLOF: Independent  PATIENT GOALS: To have less pain and more mobility  NEXT MD VISIT: 04/23/24  OBJECTIVE:   HAND DOMINANCE: Right  ADLs: Overall ADLs: Pt reports difficulty with sleeping, dressing, reaching overhead and behind her back. Pt has difficulty with caring for granddaughter. Unable to perform lifting tasks, has difficulty with housekeeping, cooking, caregiving for 36 year old granddaughter.    FUNCTIONAL OUTCOME MEASURES: UEFS  Extreme difficulty/unable (0), Quite a bit of difficulty (1), Moderate difficulty (2), Little difficulty (3), No difficulty (4) Survey date:   03/04/24  Any of your usual work, household or school activities 1  2. Your usual hobbies, recreational/sport activities 0   3. Lifting a bag of groceries to waist level 0   4. Lifting a bag of groceries above your head 0  5. Grooming your hair 1  6. Pushing up on your hands (I.e. from bathtub or chair) 1  7. Preparing food (I.e. peeling/cutting) 1  8.  Driving  4  9. Vacuuming, sweeping, or raking 3  10. Dressing  1  11. Doing up buttons 2  12. Using tools/appliances 4  13. Opening doors 1  14. Cleaning  4  15. Tying or lacing shoes 4  16. Sleeping  1  17. Laundering clothes (I.e. washing, ironing, folding) 4  18. Opening a jar 0  19. Throwing a ball 0  20. Carrying a small suitcase with your affected limb.  1  Score total:  33      UPPER EXTREMITY ROM:       Assessed in sitting, er/IR adducted  Active ROM Right eval  Shoulder flexion 98  Shoulder abduction 70  Shoulder internal rotation 90  Shoulder external rotation 20  (Blank rows = not tested)    Assessed in supine, er/IR adducted  Passive ROM Right eval  Shoulder flexion 105  Shoulder abduction 75  Shoulder internal rotation 90  Shoulder external rotation 20  (Blank rows = not tested)   UPPER EXTREMITY MMT:     Assessed in sitting, er/IR adducted-observation due to pain  MMT  Right eval  Shoulder flexion 3-/5  Shoulder abduction 3-/5  Shoulder internal rotation 3/5  Shoulder external rotation 3-/5  (Blank rows = not tested)  HAND FUNCTION: Decreased due to recent thumb fracture, has HEP  SENSATION: WFL  EDEMA: None  COGNITION: Overall cognitive status: Within functional limits for tasks assessed  OBSERVATIONS: mod fascial restrictions throughout upper arm, anterior shoulder, trapezius, and scapular regions   TODAY'S TREATMENT:                                                                                                                              DATE:      04/05/24 -manual therapy: myofascial release and trigger point applied to biceps, scapular region, and trapezius in order to reduce pain and fascial restrictions in order to improve ROM  -P/ROM: flexion, abduction, er, horizontal abduction, x10  -AA/ROM: seated, flexion, protraction, abduction, horizontal abduction, er/IR, x12  -A/ROM: seated, flexion, protraction, abduction,  horizontal abduction, er/IR, x10  -X to V arms, x10  -Goal Post arms, x10  -Overhead lacing  -Scapular Strengthening: red band, extension, retraction, rows, x10  03/24/24 -manual therapy: myofascial release and trigger point applied to biceps, scapular region, and trapezius in order to reduce pain and fascial restrictions in order to improve ROM  -P/ROM: flexion, abduction, er, horizontal abduction, x5  -AA/ROM: seated, flexion, protraction, abduction, horizontal abduction, er/IR, x12  -A/ROM: seated, flexion, protraction, abduction, horizontal abduction, er/IR, x10  -Functional Reaching: counter to 1st shelf to 2nd shelf, back down in abduction, 10 cones  -UBE: level 1, 2.5' forwards and backwards  03/22/24 -manual therapy: myofascial release and trigger point applied to biceps, scapular region, and trapezius in order to reduce pain and fascial restrictions in order to improve ROM  -P/ROM: flexion, abduction, er, horizontal abduction, x5  -AA/ROM: seated, flexion, protraction, abduction, horizontal abduction, er/IR, x10  -A/ROM: seated, flexion, protraction, abduction, horizontal abduction, er/IR, x10 -Wall Climbs: flexion, abduction, x10 -ABC's on the wall, green ball    PATIENT EDUCATION: Education details: Publishing rights manager Person educated: Patient Education method: Explanation, Demonstration, and Handouts Education comprehension: verbalized understanding and returned demonstration  HOME EXERCISE PROGRAM: Eval: Scapular A/ROM, table slides 8/28: Wall Climbs 9/8: A/ROM 9/22: Scapular Strengthening  GOALS: Goals reviewed with patient? Yes   SHORT TERM GOALS: Target date: 04/04/24  Pt will be provided with and educated on HEP to improve mobility in RUE required for use during ADL completion.   Goal status: IN PROGRESS  2.  Pt will increase RUE P/ROM by 30+ degrees to improve ability to use RUE during dressing tasks with minimal compensatory techniques.   Goal status:  IN PROGRESS  3.  Pt will increase RUE strength to 3+/5 to improve ability to reach for items at waist to chest height during bathing and grooming tasks.   Goal status: IN PROGRESS    LONG TERM GOALS: Target date: 05/04/24  Pt will decrease pain in RUE to 4/10 or less  to improve ability to sleep for 2+ consecutive hours without waking due to pain.   Goal status: IN PROGRESS  2.  Pt will decrease RUE fascial restrictions to min amounts or less to improve mobility required for functional reaching tasks.   Goal status: IN PROGRESS  3.  Pt will increase RUE A/ROM by 45+ degrees to improve ability to use RUE when reaching overhead or behind back during dressing and bathing tasks.   Goal status: IN PROGRESS  4.  Pt will increase RUE strength to 4/5 or greater to improve ability to use RUE when lifting or carrying items during meal preparation/housework/yardwork tasks.   Goal status: IN PROGRESS   ASSESSMENT:  CLINICAL IMPRESSION: Pt reports good compliance with her HEP and feels like she is stretching her shoulder out more. This session her pain was mild and she was able to achieve 80% of full ROM. OT adding strengthening and endurance work this session with good follow through. Verbal and tactile cuing provided for positioning and technique throughout session.    PERFORMANCE DEFICITS: in functional skills including in functional skills including ADLs, IADLs, coordination, tone, ROM, strength, pain, fascial restrictions, muscle spasms, and UE functional use   PLAN:  OT FREQUENCY: 2x/week  OT DURATION: 8 weeks  PLANNED INTERVENTIONS: 97168 OT Re-evaluation, 97535 self care/ADL training, 02889 therapeutic exercise, 97530 therapeutic activity, 97112 neuromuscular re-education, 97140 manual therapy, 97014 electrical stimulation unattended, patient/family education, and DME and/or AE instructions  RECOMMENDED OTHER SERVICES: None at this time  CONSULTED AND AGREED WITH PLAN OF CARE:  Patient  PLAN FOR NEXT SESSION: Follow up on HEP, manual techniques, passive stretching, AA/ROM progressing to A/ROM, scapular mobility and stability work    Valentin Nightingale, OTR/L [phone] 04/05/2024, 9:54 AM

## 2024-04-05 NOTE — Patient Instructions (Signed)
 Let me reach out to the liver specialist down in [place] and run your case with them. They may advise trial of ursodiol. I will be in touch with you in about a week.   For fatty liver, please continue to work at controlling your diabetes, exercising, and weight reduction. Try to lose between 10-20 pounds over the next 4-6 months.

## 2024-04-05 NOTE — Patient Instructions (Signed)

## 2024-04-08 ENCOUNTER — Encounter (HOSPITAL_COMMUNITY): Payer: Self-pay | Admitting: Occupational Therapy

## 2024-04-08 ENCOUNTER — Ambulatory Visit (HOSPITAL_COMMUNITY): Admitting: Occupational Therapy

## 2024-04-08 DIAGNOSIS — M25611 Stiffness of right shoulder, not elsewhere classified: Secondary | ICD-10-CM

## 2024-04-08 DIAGNOSIS — M25511 Pain in right shoulder: Secondary | ICD-10-CM

## 2024-04-08 DIAGNOSIS — R29898 Other symptoms and signs involving the musculoskeletal system: Secondary | ICD-10-CM

## 2024-04-08 NOTE — Patient Instructions (Signed)

## 2024-04-08 NOTE — Therapy (Signed)
 OUTPATIENT OCCUPATIONAL THERAPY ORTHO TREATMENT NOTE Mini Reassessment  Patient Name: Latoya Decker MRN: [id number] DOB:28-Sep-1960, 63 y.o., female Today's Date: 04/08/2024  Progress Note Reporting Period 03/04/24 to 04/08/24  See note below for Objective Data and Assessment of Progress/Goals.    END OF SESSION:  OT End of Session - 04/08/24 0945     Visit Number 9    Number of Visits 16    Date for Recertification  05/03/24   progress note 04/04/24   Authorization Type [employer]    Authorization Time Period no auth required; 30 visits combined PT/OT/ST    Authorization - Visit Number 9    Authorization - Number of Visits 30    OT Start Time 0902    OT Stop Time 0945    OT Time Calculation (min) 43 min    Activity Tolerance Patient tolerated treatment well    Behavior During Therapy [facility] for tasks assessed/performed           Past Medical History:  Diagnosis Date   Anxiety    Depression    Diverticulosis    Duodenal ulcer 01/05/2010   EGD Dr Starling, h pylori gastritis, completed treatment   Duodenitis    Eosinophilic gastroenteritis JAN 2015   DEC 2014: NL CMP/GIARDIA Ag   Family hx of colon cancer    Mother & sister   Gastritis    hospitalized 6/23-6/25 2011    Headache(784.0)    migraines   Helicobacter pylori gastritis    Hypertension    Nondisplaced fracture of distal phalanx of right thumb with routine healing 10/17/17 11/13/2017   PUD (peptic ulcer disease)    Dr Claudene   S/P colonoscopy 12/27/2003   Dr Petra   S/P endoscopy 12/27/2003   Dr Monalisa superficial ulcers GEJ, duodenitis,gastritis   Past Surgical History:  Procedure Laterality Date   ABDOMINAL HYSTERECTOMY     bilateral tubal ligation  1989   BIOPSY  06/10/2011   SLF: mild gastritis, bx benign, sb bx benign    CHOLECYSTECTOMY  1997   aph-Smith   COLONOSCOPY  05/2011   SLF: multiple hyperplastic sessile polyps, scattered diverticulosis. next TCS 05/2016 ([facility])    COLONOSCOPY N/A 04/21/2017   Dr. Harvey: two 2-81mm colon polyps removed, diverticulosis, hemorrhoids. next TCS in 5 years.    COLONOSCOPY WITH PROPOFOL  N/A 05/21/2022   Procedure: COLONOSCOPY WITH PROPOFOL ;  Surgeon: Cindie Carlin POUR, DO;  Location: [facility];  Service: Endoscopy;  Laterality: N/A;  11:00 am   ESOPHAGOGASTRODUODENOSCOPY N/A 07/28/2013   EOS INOPHILIC GASTROENTERITIS   ESOPHAGOGASTRODUODENOSCOPY N/A 04/21/2017   Dr. Harvey: mild gastritis with H.pylori present, esophageal biopsies negative.    MOLE REMOVAL  09/09/2022   PARTIAL HYSTERECTOMY  2007   1 ovary removed   POLYPECTOMY  06/10/2011   Procedure: POLYPECTOMY;  Surgeon: Margo CHRISTELLA Harvey, MD;  Location: [facility];  Service: Endoscopy;;  Cecal polypectomy; Sigmoid colon polypectomies   POLYPECTOMY  05/21/2022   Procedure: POLYPECTOMY INTESTINAL;  Surgeon: Cindie Carlin POUR, DO;  Location: [facility];  Service: Endoscopy;;   TUBAL LIGATION     Patient Active Problem List   Diagnosis Date Noted   Metabolic dysfunction-associated steatohepatitis (MASH) 04/05/2024   Chronic right shoulder pain 02/18/2024   Acute pain of right shoulder 01/04/2024   Hyperlipidemia LDL goal <100 03/27/2023   Seasonal and perennial allergic rhinitis 10/04/2022   Allergic conjunctivitis of both eyes 10/04/2022   Allergy  with anaphylaxis due to food 10/04/2022  Type 2 diabetes mellitus with other specified complication (HCC) 09/25/2022   Pain of right thumb 09/19/2022   Multiple food allergies 06/17/2022   Multiple pigmented nevi 06/17/2022   Onychomycosis 05/20/2021   Essential hypertension 11/29/2020   Vitamin D  deficiency 07/04/2019   Helicobacter pylori gastritis 04/16/2018   Tubular adenoma of colon 01/02/2018   Enlarged skin mole 01/02/2018   GERD (gastroesophageal reflux disease) 03/14/2017   Elevated alkaline phosphatase level 10/16/2016   Migraine variant without intractability 08/31/2012   Family history of colon cancer  05/14/2011   Duodenal ulcer without hemorrhage or perforation and without obstruction 01/12/2010   Obesity (BMI 30.0-34.9) 03/30/2008    PCP: Dr. Rollene Pesa  REFERRING PROVIDER: Dr. Taft Minerva  ONSET DATE: ~May 2025  REFERRING DIAG: M75.01 (ICD-10-CM) - Adhesive capsulitis of right shoulder   THERAPY DIAG:  Acute pain of right shoulder  Stiffness of right shoulder, not elsewhere classified  Other symptoms and signs involving the musculoskeletal system  Rationale for Evaluation and Treatment: Rehabilitation  SUBJECTIVE:   SUBJECTIVE STATEMENT: S: It's feeling pretty good.  PERTINENT HISTORY: Pt is a 63 y/o female presenting with right shoulder adhesive capsulitis, pain and decreased functioning has been present for approximately 2 months. Pt received steroid injection on 02/27/24 with some mild relief.  PRECAUTIONS: None  WEIGHT BEARING RESTRICTIONS: No  PAIN:  Are you having pain? No  FALLS: Has patient fallen in last 6 months? No  PLOF: Independent  PATIENT GOALS: To have less pain and more mobility  NEXT MD VISIT: 04/23/24  OBJECTIVE:   HAND DOMINANCE: Right  ADLs: Overall ADLs: Pt reports difficulty with sleeping, dressing, reaching overhead and behind her back. Pt has difficulty with caring for granddaughter. Unable to perform lifting tasks, has difficulty with housekeeping, cooking, caregiving for 49 year old granddaughter.    FUNCTIONAL OUTCOME MEASURES: UEFS  Extreme difficulty/unable (0), Quite a bit of difficulty (1), Moderate difficulty (2), Little difficulty (3), No difficulty (4) Survey date:   03/04/24  Any of your usual work, household or school activities 1  2. Your usual hobbies, recreational/sport activities 0   3. Lifting a bag of groceries to waist level 0   4. Lifting a bag of groceries above your head 0  5. Grooming your hair 1  6. Pushing up on your hands (I.e. from bathtub or chair) 1  7. Preparing food (I.e.  peeling/cutting) 1  8. Driving  4  9. Vacuuming, sweeping, or raking 3  10. Dressing  1  11. Doing up buttons 2  12. Using tools/appliances 4  13. Opening doors 1  14. Cleaning  4  15. Tying or lacing shoes 4  16. Sleeping  1  17. Laundering clothes (I.e. washing, ironing, folding) 4  18. Opening a jar 0  19. Throwing a ball 0  20. Carrying a small suitcase with your affected limb.  1  Score total:  33      UPPER EXTREMITY ROM:       Assessed in sitting, er/IR adducted  Active ROM Right eval Right 04/08/24  Shoulder flexion 98 137  Shoulder abduction 70 171  Shoulder internal rotation 90 90  Shoulder external rotation 20 38  (Blank rows = not tested)    Assessed in supine, er/IR adducted  Passive ROM Right eval  Shoulder flexion 105  Shoulder abduction 75  Shoulder internal rotation 90  Shoulder external rotation 20  (Blank rows = not tested)   UPPER EXTREMITY MMT:  Assessed in sitting, er/IR adducted-observation due to pain  MMT Right eval Right 04/08/24  Shoulder flexion 3-/5 4/5  Shoulder abduction 3-/5 4-/5  Shoulder internal rotation 3/5 5/5  Shoulder external rotation 3-/5 4+/5  (Blank rows = not tested)  HAND FUNCTION: Decreased due to recent thumb fracture, has HEP  SENSATION: WFL  EDEMA: None  COGNITION: Overall cognitive status: Within functional limits for tasks assessed  OBSERVATIONS: mod fascial restrictions throughout upper arm, anterior shoulder, trapezius, and scapular regions   TODAY'S TREATMENT:                                                                                                                              DATE:      04/08/24 -manual therapy: myofascial release and trigger point applied to biceps, scapular region, and trapezius in order to reduce pain and fascial restrictions in order to improve ROM  -P/ROM: flexion, abduction, er, horizontal abduction, x10  -AA/ROM: seated, flexion, protraction, abduction,  horizontal abduction, er/IR, x12  -A/ROM: seated, flexion, protraction, abduction, horizontal abduction, er/IR, x10  -X to V arms, x10  -Goal Post arms, x10  -Stretching: IR behind the back, 5x10  04/05/24 -manual therapy: myofascial release and trigger point applied to biceps, scapular region, and trapezius in order to reduce pain and fascial restrictions in order to improve ROM  -P/ROM: flexion, abduction, er, horizontal abduction, x10  -AA/ROM: seated, flexion, protraction, abduction, horizontal abduction, er/IR, x12  -A/ROM: seated, flexion, protraction, abduction, horizontal abduction, er/IR, x10  -X to V arms, x10  -Goal Post arms, x10  -Overhead lacing  -Scapular Strengthening: red band, extension, retraction, rows, x10  03/24/24 -manual therapy: myofascial release and trigger point applied to biceps, scapular region, and trapezius in order to reduce pain and fascial restrictions in order to improve ROM  -P/ROM: flexion, abduction, er, horizontal abduction, x5  -AA/ROM: seated, flexion, protraction, abduction, horizontal abduction, er/IR, x12  -A/ROM: seated, flexion, protraction, abduction, horizontal abduction, er/IR, x10  -Functional Reaching: counter to 1st shelf to 2nd shelf, back down in abduction, 10 cones  -UBE: level 1, 2.5' forwards and backwards   PATIENT EDUCATION: Education details: Stretching Person educated: Patient Education method: Explanation, Demonstration, and Handouts Education comprehension: verbalized understanding and returned demonstration  HOME EXERCISE PROGRAM: Eval: Scapular A/ROM, table slides 8/28: Wall Climbs 9/8: A/ROM 9/22: Scapular Strengthening 9/25: Stretching  GOALS: Goals reviewed with patient? Yes   SHORT TERM GOALS: Target date: 04/04/24  Pt will be provided with and educated on HEP to improve mobility in RUE required for use during ADL completion.   Goal status: IN PROGRESS  2.  Pt will increase RUE P/ROM by 30+ degrees to  improve ability to use RUE during dressing tasks with minimal compensatory techniques.   Goal status: IN PROGRESS  3.  Pt will increase RUE strength to 3+/5 to improve ability to reach for items at waist to chest height during bathing and grooming tasks.   Goal  status: IN PROGRESS    LONG TERM GOALS: Target date: 05/04/24  Pt will decrease pain in RUE to 4/10 or less to improve ability to sleep for 2+ consecutive hours without waking due to pain.   Goal status: IN PROGRESS  2.  Pt will decrease RUE fascial restrictions to min amounts or less to improve mobility required for functional reaching tasks.   Goal status: IN PROGRESS  3.  Pt will increase RUE A/ROM by 45+ degrees to improve ability to use RUE when reaching overhead or behind back during dressing and bathing tasks.   Goal status: IN PROGRESS  4.  Pt will increase RUE strength to 4/5 or greater to improve ability to use RUE when lifting or carrying items during meal preparation/housework/yardwork tasks.   Goal status: IN PROGRESS   ASSESSMENT:  CLINICAL IMPRESSION: Pt completed a mini reassessment this session where she is demonstrating improving ROM and strength. She is nearing functional ROM. OT added stretching this session to assist with further stretching into her end ranges. Verbal and tactile cuing provided for positioning and technique throughout session.    PERFORMANCE DEFICITS: in functional skills including in functional skills including ADLs, IADLs, coordination, tone, ROM, strength, pain, fascial restrictions, muscle spasms, and UE functional use   PLAN:  OT FREQUENCY: 2x/week  OT DURATION: 8 weeks  PLANNED INTERVENTIONS: 97168 OT Re-evaluation, 97535 self care/ADL training, 02889 therapeutic exercise, 97530 therapeutic activity, 97112 neuromuscular re-education, 97140 manual therapy, 97014 electrical stimulation unattended, patient/family education, and DME and/or AE instructions  RECOMMENDED OTHER  SERVICES: None at this time  CONSULTED AND AGREED WITH PLAN OF CARE: Patient  PLAN FOR NEXT SESSION: Follow up on HEP, manual techniques, passive stretching, AA/ROM progressing to A/ROM, scapular mobility and stability work    Valentin Nightingale, OTR/L [phone] 04/08/2024, 12:50 PM

## 2024-04-12 ENCOUNTER — Ambulatory Visit (HOSPITAL_COMMUNITY): Admitting: Occupational Therapy

## 2024-04-12 ENCOUNTER — Encounter (HOSPITAL_COMMUNITY): Payer: Self-pay | Admitting: Occupational Therapy

## 2024-04-12 DIAGNOSIS — R29898 Other symptoms and signs involving the musculoskeletal system: Secondary | ICD-10-CM

## 2024-04-12 DIAGNOSIS — M25611 Stiffness of right shoulder, not elsewhere classified: Secondary | ICD-10-CM

## 2024-04-12 DIAGNOSIS — M25511 Pain in right shoulder: Secondary | ICD-10-CM | POA: Diagnosis not present

## 2024-04-12 NOTE — Therapy (Signed)
 OUTPATIENT OCCUPATIONAL THERAPY ORTHO TREATMENT NOTE  Patient Name: Latoya Decker MRN: [id number] DOB:[birth year], 63 y.o., female Today's Date: 04/12/2024   END OF SESSION:  OT End of Session - 04/12/24 0945     Visit Number 10    Number of Visits 16    Date for Recertification  05/03/24   progress note 04/04/24   Authorization Type [employer]    Authorization Time Period no auth required; 30 visits combined PT/OT/ST    Authorization - Visit Number 10    Authorization - Number of Visits 30    Progress Note Due on Visit 20    OT Start Time 0902    OT Stop Time 0945    OT Time Calculation (min) 43 min    Activity Tolerance Patient tolerated treatment well    Behavior During Therapy [facility] for tasks assessed/performed            Past Medical History:  Diagnosis Date   Anxiety    Depression    Diverticulosis    Duodenal ulcer 01/05/2010   EGD Dr Starling, h pylori gastritis, completed treatment   Duodenitis    Eosinophilic gastroenteritis JAN 2015   DEC 2014: NL CMP/GIARDIA Ag   Family hx of colon cancer    Mother & sister   Gastritis    hospitalized 6/23-6/25 2011    Headache(784.0)    migraines   Helicobacter pylori gastritis    Hypertension    Nondisplaced fracture of distal phalanx of right thumb with routine healing 10/17/17 11/13/2017   PUD (peptic ulcer disease)    Dr Claudene   S/P colonoscopy 12/27/2003   Dr Petra   S/P endoscopy 12/27/2003   Dr Monalisa superficial ulcers GEJ, duodenitis,gastritis   Past Surgical History:  Procedure Laterality Date   ABDOMINAL HYSTERECTOMY     bilateral tubal ligation  1989   BIOPSY  06/10/2011   SLF: mild gastritis, bx benign, sb bx benign    CHOLECYSTECTOMY  1997   aph-Smith   COLONOSCOPY  05/2011   SLF: multiple hyperplastic sessile polyps, scattered diverticulosis. next TCS 05/2016 ([facility])   COLONOSCOPY N/A 04/21/2017   Dr. Harvey: two 2-42mm colon polyps removed, diverticulosis, hemorrhoids. next  TCS in 5 years.    COLONOSCOPY WITH PROPOFOL  N/A 05/21/2022   Procedure: COLONOSCOPY WITH PROPOFOL ;  Surgeon: Cindie Carlin POUR, DO;  Location: [facility];  Service: Endoscopy;  Laterality: N/A;  11:00 am   ESOPHAGOGASTRODUODENOSCOPY N/A 07/28/2013   EOS INOPHILIC GASTROENTERITIS   ESOPHAGOGASTRODUODENOSCOPY N/A 04/21/2017   Dr. Harvey: mild gastritis with H.pylori present, esophageal biopsies negative.    MOLE REMOVAL  09/09/2022   PARTIAL HYSTERECTOMY  2007   1 ovary removed   POLYPECTOMY  06/10/2011   Procedure: POLYPECTOMY;  Surgeon: Margo CHRISTELLA Harvey, MD;  Location: [facility];  Service: Endoscopy;;  Cecal polypectomy; Sigmoid colon polypectomies   POLYPECTOMY  05/21/2022   Procedure: POLYPECTOMY INTESTINAL;  Surgeon: Cindie Carlin POUR, DO;  Location: [facility];  Service: Endoscopy;;   TUBAL LIGATION     Patient Active Problem List   Diagnosis Date Noted   Metabolic dysfunction-associated steatohepatitis (MASH) 04/05/2024   Chronic right shoulder pain 02/18/2024   Acute pain of right shoulder 01/04/2024   Hyperlipidemia LDL goal <100 03/27/2023   Seasonal and perennial allergic rhinitis 10/04/2022   Allergic conjunctivitis of both eyes 10/04/2022   Allergy  with anaphylaxis due to food 10/04/2022   Type 2 diabetes mellitus with other specified complication (HCC) 09/25/2022  Pain of right thumb 09/19/2022   Multiple food allergies 06/17/2022   Multiple pigmented nevi 06/17/2022   Onychomycosis 05/20/2021   Essential hypertension 11/29/2020   Vitamin D  deficiency 07/04/2019   Helicobacter pylori gastritis 04/16/2018   Tubular adenoma of colon 01/02/2018   Enlarged skin mole 01/02/2018   GERD (gastroesophageal reflux disease) 03/14/2017   Elevated alkaline phosphatase level 10/16/2016   Migraine variant without intractability 08/31/2012   Family history of colon cancer 05/14/2011   Duodenal ulcer without hemorrhage or perforation and without obstruction 01/12/2010   Obesity  (BMI 30.0-34.9) 03/30/2008    PCP: Dr. Rollene Pesa  REFERRING PROVIDER: Dr. Taft Minerva  ONSET DATE: ~May 2025  REFERRING DIAG: M75.01 (ICD-10-CM) - Adhesive capsulitis of right shoulder   THERAPY DIAG:  Acute pain of right shoulder  Stiffness of right shoulder, not elsewhere classified  Other symptoms and signs involving the musculoskeletal system  Rationale for Evaluation and Treatment: Rehabilitation  SUBJECTIVE:   SUBJECTIVE STATEMENT: S: It hasn't felt too bad today  PERTINENT HISTORY: Pt is a 63 y/o female presenting with right shoulder adhesive capsulitis, pain and decreased functioning has been present for approximately 2 months. Pt received steroid injection on 02/27/24 with some mild relief.  PRECAUTIONS: None  WEIGHT BEARING RESTRICTIONS: No  PAIN:  Are you having pain? No  FALLS: Has patient fallen in last 6 months? No  PLOF: Independent  PATIENT GOALS: To have less pain and more mobility  NEXT MD VISIT: 04/23/24  OBJECTIVE:   HAND DOMINANCE: Right  ADLs: Overall ADLs: Pt reports difficulty with sleeping, dressing, reaching overhead and behind her back. Pt has difficulty with caring for granddaughter. Unable to perform lifting tasks, has difficulty with housekeeping, cooking, caregiving for 70 year old granddaughter.    FUNCTIONAL OUTCOME MEASURES: UEFS  Extreme difficulty/unable (0), Quite a bit of difficulty (1), Moderate difficulty (2), Little difficulty (3), No difficulty (4) Survey date:   03/04/24  Any of your usual work, household or school activities 1  2. Your usual hobbies, recreational/sport activities 0   3. Lifting a bag of groceries to waist level 0   4. Lifting a bag of groceries above your head 0  5. Grooming your hair 1  6. Pushing up on your hands (I.e. from bathtub or chair) 1  7. Preparing food (I.e. peeling/cutting) 1  8. Driving  4  9. Vacuuming, sweeping, or raking 3  10. Dressing  1  11. Doing up buttons 2   12. Using tools/appliances 4  13. Opening doors 1  14. Cleaning  4  15. Tying or lacing shoes 4  16. Sleeping  1  17. Laundering clothes (I.e. washing, ironing, folding) 4  18. Opening a jar 0  19. Throwing a ball 0  20. Carrying a small suitcase with your affected limb.  1  Score total:  33      UPPER EXTREMITY ROM:       Assessed in sitting, er/IR adducted  Active ROM Right eval Right 04/08/24  Shoulder flexion 98 137  Shoulder abduction 70 171  Shoulder internal rotation 90 90  Shoulder external rotation 20 38  (Blank rows = not tested)    Assessed in supine, er/IR adducted  Passive ROM Right eval  Shoulder flexion 105  Shoulder abduction 75  Shoulder internal rotation 90  Shoulder external rotation 20  (Blank rows = not tested)   UPPER EXTREMITY MMT:     Assessed in sitting, er/IR adducted-observation due to pain  MMT  Right eval Right 04/08/24  Shoulder flexion 3-/5 4/5  Shoulder abduction 3-/5 4-/5  Shoulder internal rotation 3/5 5/5  Shoulder external rotation 3-/5 4+/5  (Blank rows = not tested)  HAND FUNCTION: Decreased due to recent thumb fracture, has HEP  SENSATION: WFL  EDEMA: None  COGNITION: Overall cognitive status: Within functional limits for tasks assessed  OBSERVATIONS: mod fascial restrictions throughout upper arm, anterior shoulder, trapezius, and scapular regions   TODAY'S TREATMENT:                                                                                                                              DATE:     04/12/24 -manual therapy: myofascial release and trigger point applied to biceps, scapular region, and trapezius in order to reduce pain and fascial restrictions in order to improve ROM  -P/ROM: flexion, abduction, er, horizontal abduction, x10  -AA/ROM: seated, flexion, protraction, abduction, horizontal abduction, er/IR, x12  -A/ROM: seated, flexion, protraction, abduction, horizontal abduction, er/IR, x10  -X  to V arms, x10  -Goal Post arms, x10 -Functional Reaching: 1# wrist weight,  counter to 1st shelf to 2nd shelf, back down in abduction, 10 cones -Overhead Lacing, 1# wrist weight  04/08/24 -manual therapy: myofascial release and trigger point applied to biceps, scapular region, and trapezius in order to reduce pain and fascial restrictions in order to improve ROM  -P/ROM: flexion, abduction, er, horizontal abduction, x10  -AA/ROM: seated, flexion, protraction, abduction, horizontal abduction, er/IR, x12  -A/ROM: seated, flexion, protraction, abduction, horizontal abduction, er/IR, x10  -X to V arms, x10  -Goal Post arms, x10  -Stretching: IR behind the back, 5x10  04/05/24 -manual therapy: myofascial release and trigger point applied to biceps, scapular region, and trapezius in order to reduce pain and fascial restrictions in order to improve ROM  -P/ROM: flexion, abduction, er, horizontal abduction, x10  -AA/ROM: seated, flexion, protraction, abduction, horizontal abduction, er/IR, x12  -A/ROM: seated, flexion, protraction, abduction, horizontal abduction, er/IR, x10  -X to V arms, x10  -Goal Post arms, x10  -Overhead lacing  -Scapular Strengthening: red band, extension, retraction, rows, x10  03/24/24 -manual therapy: myofascial release and trigger point applied to biceps, scapular region, and trapezius in order to reduce pain and fascial restrictions in order to improve ROM  -P/ROM: flexion, abduction, er, horizontal abduction, x5  -AA/ROM: seated, flexion, protraction, abduction, horizontal abduction, er/IR, x12  -A/ROM: seated, flexion, protraction, abduction, horizontal abduction, er/IR, x10  -Functional Reaching: counter to 1st shelf to 2nd shelf, back down in abduction, 10 cones  -UBE: level 1, 2.5' forwards and backwards   PATIENT EDUCATION: Education details: Continue HEP Person educated: Patient Education method: Explanation, Demonstration, and Handouts Education  comprehension: verbalized understanding and returned demonstration  HOME EXERCISE PROGRAM: Eval: Scapular A/ROM, table slides 8/28: Wall Climbs 9/8: A/ROM 9/22: Scapular Strengthening 9/25: Stretching  GOALS: Goals reviewed with patient? Yes   SHORT TERM GOALS: Target date: 04/04/24  Pt will be provided  with and educated on HEP to improve mobility in RUE required for use during ADL completion.   Goal status: IN PROGRESS  2.  Pt will increase RUE P/ROM by 30+ degrees to improve ability to use RUE during dressing tasks with minimal compensatory techniques.   Goal status: IN PROGRESS  3.  Pt will increase RUE strength to 3+/5 to improve ability to reach for items at waist to chest height during bathing and grooming tasks.   Goal status: IN PROGRESS    LONG TERM GOALS: Target date: 05/04/24  Pt will decrease pain in RUE to 4/10 or less to improve ability to sleep for 2+ consecutive hours without waking due to pain.   Goal status: IN PROGRESS  2.  Pt will decrease RUE fascial restrictions to min amounts or less to improve mobility required for functional reaching tasks.   Goal status: IN PROGRESS  3.  Pt will increase RUE A/ROM by 45+ degrees to improve ability to use RUE when reaching overhead or behind back during dressing and bathing tasks.   Goal status: IN PROGRESS  4.  Pt will increase RUE strength to 4/5 or greater to improve ability to use RUE when lifting or carrying items during meal preparation/housework/yardwork tasks.   Goal status: IN PROGRESS   ASSESSMENT:  CLINICAL IMPRESSION: This session pt continues to work on her ROM and endurance. She demonstrated difficulties with flexion and external rotation both assisted and actively. OT had pt working on functional reaching this session and overhead lacing for endurance, as well as reaching over head with light wrist weight to simulate weight of dishes or clothes. Verbal and tactile cuing for positioning and  technique throughout session.   PERFORMANCE DEFICITS: in functional skills including in functional skills including ADLs, IADLs, coordination, tone, ROM, strength, pain, fascial restrictions, muscle spasms, and UE functional use   PLAN:  OT FREQUENCY: 2x/week  OT DURATION: 8 weeks  PLANNED INTERVENTIONS: 97168 OT Re-evaluation, 97535 self care/ADL training, 02889 therapeutic exercise, 97530 therapeutic activity, 97112 neuromuscular re-education, 97140 manual therapy, 97014 electrical stimulation unattended, patient/family education, and DME and/or AE instructions  RECOMMENDED OTHER SERVICES: None at this time  CONSULTED AND AGREED WITH PLAN OF CARE: Patient  PLAN FOR NEXT SESSION: Follow up on HEP, manual techniques, passive stretching, AA/ROM progressing to A/ROM, scapular mobility and stability work    [employer], OTR/L [phone] 04/12/2024, 1:26 PM

## 2024-04-15 ENCOUNTER — Ambulatory Visit (HOSPITAL_COMMUNITY): Attending: Family Medicine | Admitting: Occupational Therapy

## 2024-04-15 ENCOUNTER — Encounter (HOSPITAL_COMMUNITY): Payer: Self-pay | Admitting: Occupational Therapy

## 2024-04-15 DIAGNOSIS — M25611 Stiffness of right shoulder, not elsewhere classified: Secondary | ICD-10-CM | POA: Insufficient documentation

## 2024-04-15 DIAGNOSIS — M25511 Pain in right shoulder: Secondary | ICD-10-CM | POA: Insufficient documentation

## 2024-04-15 DIAGNOSIS — R29898 Other symptoms and signs involving the musculoskeletal system: Secondary | ICD-10-CM | POA: Insufficient documentation

## 2024-04-15 NOTE — Therapy (Signed)
 OUTPATIENT OCCUPATIONAL THERAPY ORTHO TREATMENT NOTE  Patient Name: Latoya Decker MRN: [id number] DOB:March 11, 1961, 63 y.o., female Today's Date: 04/15/2024   END OF SESSION:  OT End of Session - 04/15/24 0933     Visit Number 11    Number of Visits 16    Date for Recertification  05/03/24    Authorization Type [employer]    Authorization Time Period no auth required; 30 visits combined PT/OT/ST    Authorization - Visit Number 11    Authorization - Number of Visits 30    Progress Note Due on Visit 20    OT Start Time [phone]    OT Stop Time 0932    OT Time Calculation (min) 39 min    Activity Tolerance Patient tolerated treatment well    Behavior During Therapy [facility] for tasks assessed/performed             Past Medical History:  Diagnosis Date   Anxiety    Depression    Diverticulosis    Duodenal ulcer 01/05/2010   EGD Dr Starling, h pylori gastritis, completed treatment   Duodenitis    Eosinophilic gastroenteritis JAN 2015   DEC 2014: NL CMP/GIARDIA Ag   Family hx of colon cancer    Mother & sister   Gastritis    hospitalized 6/23-6/25 2011    Headache(784.0)    migraines   Helicobacter pylori gastritis    Hypertension    Nondisplaced fracture of distal phalanx of right thumb with routine healing 10/17/17 11/13/2017   PUD (peptic ulcer disease)    Dr Claudene   S/P colonoscopy 12/27/2003   Dr Petra   S/P endoscopy 12/27/2003   Dr Monalisa superficial ulcers GEJ, duodenitis,gastritis   Past Surgical History:  Procedure Laterality Date   ABDOMINAL HYSTERECTOMY     bilateral tubal ligation  1989   BIOPSY  06/10/2011   SLF: mild gastritis, bx benign, sb bx benign    CHOLECYSTECTOMY  1997   aph-Smith   COLONOSCOPY  05/2011   SLF: multiple hyperplastic sessile polyps, scattered diverticulosis. next TCS 05/2016 ([facility])   COLONOSCOPY N/A 04/21/2017   Dr. Harvey: two 2-62mm colon polyps removed, diverticulosis, hemorrhoids. next TCS in 5 years.     COLONOSCOPY WITH PROPOFOL  N/A 05/21/2022   Procedure: COLONOSCOPY WITH PROPOFOL ;  Surgeon: Cindie Carlin POUR, DO;  Location: [facility];  Service: Endoscopy;  Laterality: N/A;  11:00 am   ESOPHAGOGASTRODUODENOSCOPY N/A 07/28/2013   EOS INOPHILIC GASTROENTERITIS   ESOPHAGOGASTRODUODENOSCOPY N/A 04/21/2017   Dr. Harvey: mild gastritis with H.pylori present, esophageal biopsies negative.    MOLE REMOVAL  09/09/2022   PARTIAL HYSTERECTOMY  2007   1 ovary removed   POLYPECTOMY  06/10/2011   Procedure: POLYPECTOMY;  Surgeon: Margo CHRISTELLA Harvey, MD;  Location: [facility];  Service: Endoscopy;;  Cecal polypectomy; Sigmoid colon polypectomies   POLYPECTOMY  05/21/2022   Procedure: POLYPECTOMY INTESTINAL;  Surgeon: Cindie Carlin POUR, DO;  Location: [facility];  Service: Endoscopy;;   TUBAL LIGATION     Patient Active Problem List   Diagnosis Date Noted   Metabolic dysfunction-associated steatohepatitis (MASH) 04/05/2024   Chronic right shoulder pain 02/18/2024   Acute pain of right shoulder 01/04/2024   Hyperlipidemia LDL goal <100 03/27/2023   Seasonal and perennial allergic rhinitis 10/04/2022   Allergic conjunctivitis of both eyes 10/04/2022   Allergy  with anaphylaxis due to food 10/04/2022   Type 2 diabetes mellitus with other specified complication (HCC) 09/25/2022   Pain of  right thumb 09/19/2022   Multiple food allergies 06/17/2022   Multiple pigmented nevi 06/17/2022   Onychomycosis 05/20/2021   Essential hypertension 11/29/2020   Vitamin D  deficiency 07/04/2019   Helicobacter pylori gastritis 04/16/2018   Tubular adenoma of colon 01/02/2018   Enlarged skin mole 01/02/2018   GERD (gastroesophageal reflux disease) 03/14/2017   Elevated alkaline phosphatase level 10/16/2016   Migraine variant without intractability 08/31/2012   Family history of colon cancer 05/14/2011   Duodenal ulcer without hemorrhage or perforation and without obstruction 01/12/2010   Obesity (BMI 30.0-34.9)  03/30/2008    PCP: Dr. Rollene Pesa  REFERRING PROVIDER: Dr. Taft Minerva  ONSET DATE: ~May 2025  REFERRING DIAG: M75.01 (ICD-10-CM) - Adhesive capsulitis of right shoulder   THERAPY DIAG:  Acute pain of right shoulder  Stiffness of right shoulder, not elsewhere classified  Other symptoms and signs involving the musculoskeletal system  Rationale for Evaluation and Treatment: Rehabilitation  SUBJECTIVE:   SUBJECTIVE STATEMENT: S: It's getting better  PERTINENT HISTORY: Pt is a 63 y/o female presenting with right shoulder adhesive capsulitis, pain and decreased functioning has been present for approximately 2 months. Pt received steroid injection on 02/27/24 with some mild relief.  PRECAUTIONS: None  WEIGHT BEARING RESTRICTIONS: No  PAIN:  Are you having pain? No  FALLS: Has patient fallen in last 6 months? No  PLOF: Independent  PATIENT GOALS: To have less pain and more mobility  NEXT MD VISIT: 04/23/24  OBJECTIVE:   HAND DOMINANCE: Right  ADLs: Overall ADLs: Pt reports difficulty with sleeping, dressing, reaching overhead and behind her back. Pt has difficulty with caring for granddaughter. Unable to perform lifting tasks, has difficulty with housekeeping, cooking, caregiving for 41 year old granddaughter.    FUNCTIONAL OUTCOME MEASURES: UEFS  Extreme difficulty/unable (0), Quite a bit of difficulty (1), Moderate difficulty (2), Little difficulty (3), No difficulty (4) Survey date:   03/04/24  Any of your usual work, household or school activities 1  2. Your usual hobbies, recreational/sport activities 0   3. Lifting a bag of groceries to waist level 0   4. Lifting a bag of groceries above your head 0  5. Grooming your hair 1  6. Pushing up on your hands (I.e. from bathtub or chair) 1  7. Preparing food (I.e. peeling/cutting) 1  8. Driving  4  9. Vacuuming, sweeping, or raking 3  10. Dressing  1  11. Doing up buttons 2  12. Using tools/appliances  4  13. Opening doors 1  14. Cleaning  4  15. Tying or lacing shoes 4  16. Sleeping  1  17. Laundering clothes (I.e. washing, ironing, folding) 4  18. Opening a jar 0  19. Throwing a ball 0  20. Carrying a small suitcase with your affected limb.  1  Score total:  33      UPPER EXTREMITY ROM:       Assessed in sitting, er/IR adducted  Active ROM Right eval Right 04/08/24  Shoulder flexion 98 137  Shoulder abduction 70 171  Shoulder internal rotation 90 90  Shoulder external rotation 20 38  (Blank rows = not tested)    Assessed in supine, er/IR adducted  Passive ROM Right eval  Shoulder flexion 105  Shoulder abduction 75  Shoulder internal rotation 90  Shoulder external rotation 20  (Blank rows = not tested)   UPPER EXTREMITY MMT:     Assessed in sitting, er/IR adducted-observation due to pain  MMT Right eval Right 04/08/24  Shoulder flexion 3-/5 4/5  Shoulder abduction 3-/5 4-/5  Shoulder internal rotation 3/5 5/5  Shoulder external rotation 3-/5 4+/5  (Blank rows = not tested)  HAND FUNCTION: Decreased due to recent thumb fracture, has HEP  OBSERVATIONS: mod fascial restrictions throughout upper arm, anterior shoulder, trapezius, and scapular regions   TODAY'S TREATMENT:                                                                                                                              DATE:    04/15/24 -manual therapy: myofascial release and trigger point applied to biceps, scapular region, and trapezius in order to reduce pain and fascial restrictions in order to improve ROM -P/ROM: flexion, abduction, er, horizontal abduction, 5 reps -A/ROM: supine-protraction, flexion, abduction, horizontal abduction, er, 12 reps -Proximal shoulder strengthening: supine-paddles, criss cross, circles each direction, 10 reps -Shoulder stretches: flexion, er, corner stretch, IR with towel behind the back, 10x30 holds -Functional reaching: placing 5 cones on top  shelf of overhead cabinet in flexion then removing; repeating in abduction -Cone pass behind back in IR, 10 reps -Scapular theraband: red-row, extension, retraction, 10 reps -Overhead lacing: seated, lacing from top down then reversing  04/12/24 -manual therapy: myofascial release and trigger point applied to biceps, scapular region, and trapezius in order to reduce pain and fascial restrictions in order to improve ROM -P/ROM: flexion, abduction, er, horizontal abduction, x10 -AA/ROM: seated, flexion, protraction, abduction, horizontal abduction, er/IR, x12 -A/ROM: seated, flexion, protraction, abduction, horizontal abduction, er/IR, x10 -X to V arms, x10 -Goal Post arms, x10 -Functional Reaching: 1# wrist weight,  counter to 1st shelf to 2nd shelf, back down in abduction, 10 cones -Overhead Lacing, 1# wrist weight  04/08/24 -manual therapy: myofascial release and trigger point applied to biceps, scapular region, and trapezius in order to reduce pain and fascial restrictions in order to improve ROM -P/ROM: flexion, abduction, er, horizontal abduction, x10 -AA/ROM: seated, flexion, protraction, abduction, horizontal abduction, er/IR, x12 -A/ROM: seated, flexion, protraction, abduction, horizontal abduction, er/IR, x10 -X to V arms, x10 -Goal Post arms, x10 -Stretching: IR behind the back, 5x10    PATIENT EDUCATION: Education details: Continue HEP Person educated: Patient Education method: Explanation, Demonstration, and Handouts Education comprehension: verbalized understanding and returned demonstration  HOME EXERCISE PROGRAM: Eval: Scapular A/ROM, table slides 8/28: Wall Climbs 9/8: A/ROM 9/22: Scapular Strengthening 9/25: Stretching  GOALS: Goals reviewed with patient? Yes   SHORT TERM GOALS: Target date: 04/04/24  Pt will be provided with and educated on HEP to improve mobility in RUE required for use during ADL completion.   Goal status: IN PROGRESS  2.  Pt will  increase RUE P/ROM by 30+ degrees to improve ability to use RUE during dressing tasks with minimal compensatory techniques.   Goal status: IN PROGRESS  3.  Pt will increase RUE strength to 3+/5 to improve ability to reach for items at waist to chest height during bathing and grooming tasks.  Goal status: IN PROGRESS    LONG TERM GOALS: Target date: 05/04/24  Pt will decrease pain in RUE to 4/10 or less to improve ability to sleep for 2+ consecutive hours without waking due to pain.   Goal status: IN PROGRESS  2.  Pt will decrease RUE fascial restrictions to min amounts or less to improve mobility required for functional reaching tasks.   Goal status: IN PROGRESS  3.  Pt will increase RUE A/ROM by 45+ degrees to improve ability to use RUE when reaching overhead or behind back during dressing and bathing tasks.   Goal status: IN PROGRESS  4.  Pt will increase RUE strength to 4/5 or greater to improve ability to use RUE when lifting or carrying items during meal preparation/housework/yardwork tasks.   Goal status: IN PROGRESS   ASSESSMENT:  CLINICAL IMPRESSION: Pt reports no pain and improvement in functioning during ADLs. Continues to have moderate trigger point in trapezius, ROM limitations with flexion, er, and IR. Focused on stretching into end ROM today, shoulder stretches completed with increased hold times. Also heavy focus on posture and form during tasks, verbal cuing intermittently. Pt reports fatigue but felt good stretching during session.    PERFORMANCE DEFICITS: in functional skills including in functional skills including ADLs, IADLs, coordination, tone, ROM, strength, pain, fascial restrictions, muscle spasms, and UE functional use   PLAN:  OT FREQUENCY: 2x/week  OT DURATION: 8 weeks  PLANNED INTERVENTIONS: 97168 OT Re-evaluation, 97535 self care/ADL training, 02889 therapeutic exercise, 97530 therapeutic activity, 97112 neuromuscular re-education, 97140  manual therapy, 97014 electrical stimulation unattended, patient/family education, and DME and/or AE instructions  RECOMMENDED OTHER SERVICES: None at this time  CONSULTED AND AGREED WITH PLAN OF CARE: Patient  PLAN FOR NEXT SESSION: Follow up on HEP, manual techniques, passive stretching, A/ROM, scapular mobility and stability work    [employer], OTR/L  [phone] 04/15/2024, 9:34 AM

## 2024-04-20 ENCOUNTER — Encounter (HOSPITAL_COMMUNITY): Payer: Self-pay | Admitting: Occupational Therapy

## 2024-04-20 ENCOUNTER — Ambulatory Visit (HOSPITAL_COMMUNITY): Admitting: Occupational Therapy

## 2024-04-20 DIAGNOSIS — M25611 Stiffness of right shoulder, not elsewhere classified: Secondary | ICD-10-CM

## 2024-04-20 DIAGNOSIS — M25511 Pain in right shoulder: Secondary | ICD-10-CM | POA: Diagnosis not present

## 2024-04-20 DIAGNOSIS — R29898 Other symptoms and signs involving the musculoskeletal system: Secondary | ICD-10-CM

## 2024-04-20 NOTE — Therapy (Signed)
 OUTPATIENT OCCUPATIONAL THERAPY ORTHO TREATMENT NOTE  Patient Name: Latoya Decker MRN: [id number] DOB:11-07-1960, 63 y.o., female Today's Date: 04/20/2024   END OF SESSION:  OT End of Session - 04/20/24 1152     Visit Number 12    Number of Visits 16    Date for Recertification  05/03/24    Authorization Type [employer]    Authorization Time Period no auth required; 30 visits combined PT/OT/ST    Authorization - Visit Number 12    Authorization - Number of Visits 30    Progress Note Due on Visit 20    OT Start Time 1106    OT Stop Time 1146    OT Time Calculation (min) 40 min    Activity Tolerance Patient tolerated treatment well    Behavior During Therapy WFL for tasks assessed/performed              Past Medical History:  Diagnosis Date   Anxiety    Depression    Diverticulosis    Duodenal ulcer 01/05/2010   EGD Dr Starling, h pylori gastritis, completed treatment   Duodenitis    Eosinophilic gastroenteritis JAN 2015   DEC 2014: NL CMP/GIARDIA Ag   Family hx of colon cancer    Mother & sister   Gastritis    hospitalized 6/23-6/25 2011    Headache(784.0)    migraines   Helicobacter pylori gastritis    Hypertension    Nondisplaced fracture of distal phalanx of right thumb with routine healing 10/17/17 11/13/2017   PUD (peptic ulcer disease)    Dr Claudene   S/P colonoscopy 12/27/2003   Dr Petra   S/P endoscopy 12/27/2003   Dr Monalisa superficial ulcers GEJ, duodenitis,gastritis   Past Surgical History:  Procedure Laterality Date   ABDOMINAL HYSTERECTOMY     bilateral tubal ligation  1989   BIOPSY  06/10/2011   SLF: mild gastritis, bx benign, sb bx benign    CHOLECYSTECTOMY  1997   aph-Smith   COLONOSCOPY  05/2011   SLF: multiple hyperplastic sessile polyps, scattered diverticulosis. next TCS 05/2016 ([facility])   COLONOSCOPY N/A 04/21/2017   Dr. Harvey: two 2-61mm colon polyps removed, diverticulosis, hemorrhoids. next TCS in 5 years.     COLONOSCOPY WITH PROPOFOL  N/A 05/21/2022   Procedure: COLONOSCOPY WITH PROPOFOL ;  Surgeon: Cindie Carlin POUR, DO;  Location: [facility];  Service: Endoscopy;  Laterality: N/A;  11:00 am   ESOPHAGOGASTRODUODENOSCOPY N/A 07/28/2013   EOS INOPHILIC GASTROENTERITIS   ESOPHAGOGASTRODUODENOSCOPY N/A 04/21/2017   Dr. Harvey: mild gastritis with H.pylori present, esophageal biopsies negative.    MOLE REMOVAL  09/09/2022   PARTIAL HYSTERECTOMY  2007   1 ovary removed   POLYPECTOMY  06/10/2011   Procedure: POLYPECTOMY;  Surgeon: Margo CHRISTELLA Harvey, MD;  Location: [facility];  Service: Endoscopy;;  Cecal polypectomy; Sigmoid colon polypectomies   POLYPECTOMY  05/21/2022   Procedure: POLYPECTOMY INTESTINAL;  Surgeon: Cindie Carlin POUR, DO;  Location: [facility];  Service: Endoscopy;;   TUBAL LIGATION     Patient Active Problem List   Diagnosis Date Noted   Metabolic dysfunction-associated steatohepatitis (MASH) 04/05/2024   Chronic right shoulder pain 02/18/2024   Acute pain of right shoulder 01/04/2024   Hyperlipidemia LDL goal <100 03/27/2023   Seasonal and perennial allergic rhinitis 10/04/2022   Allergic conjunctivitis of both eyes 10/04/2022   Allergy  with anaphylaxis due to food 10/04/2022   Type 2 diabetes mellitus with other specified complication (HCC) 09/25/2022   Pain  of right thumb 09/19/2022   Multiple food allergies 06/17/2022   Multiple pigmented nevi 06/17/2022   Onychomycosis 05/20/2021   Essential hypertension 11/29/2020   Vitamin D  deficiency 07/04/2019   Helicobacter pylori gastritis 04/16/2018   Tubular adenoma of colon 01/02/2018   Enlarged skin mole 01/02/2018   GERD (gastroesophageal reflux disease) 03/14/2017   Elevated alkaline phosphatase level 10/16/2016   Migraine variant without intractability 08/31/2012   Family history of colon cancer 05/14/2011   Duodenal ulcer without hemorrhage or perforation and without obstruction 01/12/2010   Obesity (BMI 30.0-34.9)  03/30/2008    PCP: Dr. Rollene Pesa  REFERRING PROVIDER: Dr. Taft Minerva  ONSET DATE: ~May 2025  REFERRING DIAG: M75.01 (ICD-10-CM) - Adhesive capsulitis of right shoulder   THERAPY DIAG:  Acute pain of right shoulder  Stiffness of right shoulder, not elsewhere classified  Other symptoms and signs involving the musculoskeletal system  Rationale for Evaluation and Treatment: Rehabilitation  SUBJECTIVE:   SUBJECTIVE STATEMENT: S: That one behind the back is so hard  PERTINENT HISTORY: Pt is a 63 y/o female presenting with right shoulder adhesive capsulitis, pain and decreased functioning has been present for approximately 2 months. Pt received steroid injection on 02/27/24 with some mild relief.  PRECAUTIONS: None  WEIGHT BEARING RESTRICTIONS: No  PAIN:  Are you having pain? No  FALLS: Has patient fallen in last 6 months? No  PLOF: Independent  PATIENT GOALS: To have less pain and more mobility  NEXT MD VISIT: 04/23/24  OBJECTIVE:   HAND DOMINANCE: Right  ADLs: Overall ADLs: Pt reports difficulty with sleeping, dressing, reaching overhead and behind her back. Pt has difficulty with caring for granddaughter. Unable to perform lifting tasks, has difficulty with housekeeping, cooking, caregiving for 66 year old granddaughter.    FUNCTIONAL OUTCOME MEASURES: UEFS  Extreme difficulty/unable (0), Quite a bit of difficulty (1), Moderate difficulty (2), Little difficulty (3), No difficulty (4) Survey date:   03/04/24  Any of your usual work, household or school activities 1  2. Your usual hobbies, recreational/sport activities 0   3. Lifting a bag of groceries to waist level 0   4. Lifting a bag of groceries above your head 0  5. Grooming your hair 1  6. Pushing up on your hands (I.e. from bathtub or chair) 1  7. Preparing food (I.e. peeling/cutting) 1  8. Driving  4  9. Vacuuming, sweeping, or raking 3  10. Dressing  1  11. Doing up buttons 2  12. Using  tools/appliances 4  13. Opening doors 1  14. Cleaning  4  15. Tying or lacing shoes 4  16. Sleeping  1  17. Laundering clothes (I.e. washing, ironing, folding) 4  18. Opening a jar 0  19. Throwing a ball 0  20. Carrying a small suitcase with your affected limb.  1  Score total:  33      UPPER EXTREMITY ROM:       Assessed in sitting, er/IR adducted  Active ROM Right eval Right 04/08/24  Shoulder flexion 98 137  Shoulder abduction 70 171  Shoulder internal rotation 90 90  Shoulder external rotation 20 38  (Blank rows = not tested)    Assessed in supine, er/IR adducted  Passive ROM Right eval  Shoulder flexion 105  Shoulder abduction 75  Shoulder internal rotation 90  Shoulder external rotation 20  (Blank rows = not tested)   UPPER EXTREMITY MMT:     Assessed in sitting, er/IR adducted-observation due to pain  MMT Right eval Right 04/08/24  Shoulder flexion 3-/5 4/5  Shoulder abduction 3-/5 4-/5  Shoulder internal rotation 3/5 5/5  Shoulder external rotation 3-/5 4+/5  (Blank rows = not tested)  HAND FUNCTION: Decreased due to recent thumb fracture, has HEP  OBSERVATIONS: mod fascial restrictions throughout upper arm, anterior shoulder, trapezius, and scapular regions   TODAY'S TREATMENT:                                                                                                                              DATE:   04/20/24 -manual therapy: myofascial release and trigger point applied to biceps, scapular region, and trapezius in order to reduce pain and fascial restrictions in order to improve ROM; muscle energy technique to flexors and internal rotators -P/ROM: flexion, abduction, er, horizontal abduction, 5 reps -A/ROM: supine-protraction, flexion, abduction, horizontal abduction, er, 12 reps -Proximal shoulder strengthening: supine-paddles, criss cross, circles each direction, 10 reps -Shoulder stretches: flexion, er, corner stretch, IR with towel  behind the back, 2x30 holds -A/ROM: standing-protraction, flexion, abduction, horizontal abduction, er, 10 reps -x to v arms: 10 reps -Y lift off: 10 reps -Scapular row: 10 reps -Cone pass behind back in IR, behind head for er, 10 reps -Pulleys: flexion, 1'  04/15/24 -manual therapy: myofascial release and trigger point applied to biceps, scapular region, and trapezius in order to reduce pain and fascial restrictions in order to improve ROM -P/ROM: flexion, abduction, er, horizontal abduction, 5 reps -A/ROM: supine-protraction, flexion, abduction, horizontal abduction, er, 12 reps -Proximal shoulder strengthening: supine-paddles, criss cross, circles each direction, 10 reps -Shoulder stretches: flexion, er, corner stretch, IR with towel behind the back, 2x30 holds -Functional reaching: placing 5 cones on top shelf of overhead cabinet in flexion then removing; repeating in abduction -Cone pass behind back in IR, 10 reps -Scapular theraband: red-row, extension, retraction, 10 reps -Overhead lacing: seated, lacing from top down then reversing  04/12/24 -manual therapy: myofascial release and trigger point applied to biceps, scapular region, and trapezius in order to reduce pain and fascial restrictions in order to improve ROM -P/ROM: flexion, abduction, er, horizontal abduction, x10 -AA/ROM: seated, flexion, protraction, abduction, horizontal abduction, er/IR, x12 -A/ROM: seated, flexion, protraction, abduction, horizontal abduction, er/IR, x10 -X to V arms, x10 -Goal Post arms, x10 -Functional Reaching: 1# wrist weight,  counter to 1st shelf to 2nd shelf, back down in abduction, 10 cones -Overhead Lacing, 1# wrist weight     PATIENT EDUCATION: Education details: Continue HEP Person educated: Patient Education method: Explanation, Demonstration, and Handouts Education comprehension: verbalized understanding and returned demonstration  HOME EXERCISE PROGRAM: Eval: Scapular A/ROM,  table slides 8/28: Wall Climbs 9/8: A/ROM 9/22: Scapular Strengthening 9/25: Stretching  GOALS: Goals reviewed with patient? Yes   SHORT TERM GOALS: Target date: 04/04/24  Pt will be provided with and educated on HEP to improve mobility in RUE required for use during ADL completion.   Goal status: IN PROGRESS  2.  Pt  will increase RUE P/ROM by 30+ degrees to improve ability to use RUE during dressing tasks with minimal compensatory techniques.   Goal status: IN PROGRESS  3.  Pt will increase RUE strength to 3+/5 to improve ability to reach for items at waist to chest height during bathing and grooming tasks.   Goal status: IN PROGRESS    LONG TERM GOALS: Target date: 05/04/24  Pt will decrease pain in RUE to 4/10 or less to improve ability to sleep for 2+ consecutive hours without waking due to pain.   Goal status: IN PROGRESS  2.  Pt will decrease RUE fascial restrictions to min amounts or less to improve mobility required for functional reaching tasks.   Goal status: IN PROGRESS  3.  Pt will increase RUE A/ROM by 45+ degrees to improve ability to use RUE when reaching overhead or behind back during dressing and bathing tasks.   Goal status: IN PROGRESS  4.  Pt will increase RUE strength to 4/5 or greater to improve ability to use RUE when lifting or carrying items during meal preparation/housework/yardwork tasks.   Goal status: IN PROGRESS   ASSESSMENT:  CLINICAL IMPRESSION: Pt reports no pain, is having difficulty with IR behind the back, does not see much progress in this area yet. Continues to have moderate trigger point in trapezius, ROM limitations with flexion, er, and IR, trialed muscle energy technique for flexion and IR with mild improvement in end ROM availability. Continued with focus on stretching into end ROM today. Also heavy focus on posture and form during tasks, verbal cuing intermittently. Pt reports fatigue but felt good stretching during session.     PERFORMANCE DEFICITS: in functional skills including in functional skills including ADLs, IADLs, coordination, tone, ROM, strength, pain, fascial restrictions, muscle spasms, and UE functional use   PLAN:  OT FREQUENCY: 2x/week  OT DURATION: 8 weeks  PLANNED INTERVENTIONS: 97168 OT Re-evaluation, 97535 self care/ADL training, 02889 therapeutic exercise, 97530 therapeutic activity, 97112 neuromuscular re-education, 97140 manual therapy, 97014 electrical stimulation unattended, patient/family education, and DME and/or AE instructions  RECOMMENDED OTHER SERVICES: None at this time  CONSULTED AND AGREED WITH PLAN OF CARE: Patient  PLAN FOR NEXT SESSION: Follow up on HEP, manual techniques, passive stretching, A/ROM, scapular mobility and stability work    [employer], OTR/L  [phone] 04/20/2024, 11:53 AM

## 2024-04-22 ENCOUNTER — Ambulatory Visit (HOSPITAL_COMMUNITY): Admitting: Occupational Therapy

## 2024-04-22 ENCOUNTER — Encounter (HOSPITAL_COMMUNITY): Payer: Self-pay | Admitting: Occupational Therapy

## 2024-04-22 DIAGNOSIS — M25511 Pain in right shoulder: Secondary | ICD-10-CM

## 2024-04-22 DIAGNOSIS — M25611 Stiffness of right shoulder, not elsewhere classified: Secondary | ICD-10-CM

## 2024-04-22 DIAGNOSIS — R29898 Other symptoms and signs involving the musculoskeletal system: Secondary | ICD-10-CM

## 2024-04-22 NOTE — Therapy (Signed)
 OUTPATIENT OCCUPATIONAL THERAPY ORTHO TREATMENT NOTE  Patient Name: DEVENEY BAYON MRN: [id number] DOB:1960-11-21, 63 y.o., female Today's Date: 04/22/2024   END OF SESSION:  OT End of Session - 04/22/24 0932     Visit Number 13    Number of Visits 16    Date for Recertification  05/03/24    Authorization Type [employer]    Authorization Time Period no auth required; 30 visits combined PT/OT/ST    Authorization - Visit Number 13    Authorization - Number of Visits 30    Progress Note Due on Visit 20    OT Start Time 0849    OT Stop Time 0928    OT Time Calculation (min) 39 min    Activity Tolerance Patient tolerated treatment well    Behavior During Therapy [facility] for tasks assessed/performed               Past Medical History:  Diagnosis Date   Anxiety    Depression    Diverticulosis    Duodenal ulcer 01/05/2010   EGD Dr Starling, h pylori gastritis, completed treatment   Duodenitis    Eosinophilic gastroenteritis JAN 2015   DEC 2014: NL CMP/GIARDIA Ag   Family hx of colon cancer    Mother & sister   Gastritis    hospitalized 6/23-6/25 2011    Headache(784.0)    migraines   Helicobacter pylori gastritis    Hypertension    Nondisplaced fracture of distal phalanx of right thumb with routine healing 10/17/17 11/13/2017   PUD (peptic ulcer disease)    Dr Claudene   S/P colonoscopy 12/27/2003   Dr Petra   S/P endoscopy 12/27/2003   Dr Monalisa superficial ulcers GEJ, duodenitis,gastritis   Past Surgical History:  Procedure Laterality Date   ABDOMINAL HYSTERECTOMY     bilateral tubal ligation  1989   BIOPSY  06/10/2011   SLF: mild gastritis, bx benign, sb bx benign    CHOLECYSTECTOMY  1997   aph-Smith   COLONOSCOPY  05/2011   SLF: multiple hyperplastic sessile polyps, scattered diverticulosis. next TCS 05/2016 ([facility])   COLONOSCOPY N/A 04/21/2017   Dr. Harvey: two 2-76mm colon polyps removed, diverticulosis, hemorrhoids. next TCS in 5 years.     COLONOSCOPY WITH PROPOFOL  N/A 05/21/2022   Procedure: COLONOSCOPY WITH PROPOFOL ;  Surgeon: Cindie Carlin POUR, DO;  Location: [facility];  Service: Endoscopy;  Laterality: N/A;  11:00 am   ESOPHAGOGASTRODUODENOSCOPY N/A 07/28/2013   EOS INOPHILIC GASTROENTERITIS   ESOPHAGOGASTRODUODENOSCOPY N/A 04/21/2017   Dr. Harvey: mild gastritis with H.pylori present, esophageal biopsies negative.    MOLE REMOVAL  09/09/2022   PARTIAL HYSTERECTOMY  2007   1 ovary removed   POLYPECTOMY  06/10/2011   Procedure: POLYPECTOMY;  Surgeon: Margo CHRISTELLA Harvey, MD;  Location: [facility];  Service: Endoscopy;;  Cecal polypectomy; Sigmoid colon polypectomies   POLYPECTOMY  05/21/2022   Procedure: POLYPECTOMY INTESTINAL;  Surgeon: Cindie Carlin POUR, DO;  Location: [facility];  Service: Endoscopy;;   TUBAL LIGATION     Patient Active Problem List   Diagnosis Date Noted   Metabolic dysfunction-associated steatohepatitis (MASH) 04/05/2024   Chronic right shoulder pain 02/18/2024   Acute pain of right shoulder 01/04/2024   Hyperlipidemia LDL goal <100 03/27/2023   Seasonal and perennial allergic rhinitis 10/04/2022   Allergic conjunctivitis of both eyes 10/04/2022   Allergy  with anaphylaxis due to food 10/04/2022   Type 2 diabetes mellitus with other specified complication (HCC) 09/25/2022  Pain of right thumb 09/19/2022   Multiple food allergies 06/17/2022   Multiple pigmented nevi 06/17/2022   Onychomycosis 05/20/2021   Essential hypertension 11/29/2020   Vitamin D  deficiency 07/04/2019   Helicobacter pylori gastritis 04/16/2018   Tubular adenoma of colon 01/02/2018   Enlarged skin mole 01/02/2018   GERD (gastroesophageal reflux disease) 03/14/2017   Elevated alkaline phosphatase level 10/16/2016   Migraine variant without intractability 08/31/2012   Family history of colon cancer 05/14/2011   Duodenal ulcer without hemorrhage or perforation and without obstruction 01/12/2010   Obesity (BMI 30.0-34.9)  03/30/2008    PCP: Dr. Rollene Pesa  REFERRING PROVIDER: Dr. Taft Minerva  ONSET DATE: ~May 2025  REFERRING DIAG: M75.01 (ICD-10-CM) - Adhesive capsulitis of right shoulder   THERAPY DIAG:  Acute pain of right shoulder  Stiffness of right shoulder, not elsewhere classified  Other symptoms and signs involving the musculoskeletal system  Rationale for Evaluation and Treatment: Rehabilitation  SUBJECTIVE:   SUBJECTIVE STATEMENT: S: Absolutely good, no problems  PERTINENT HISTORY: Pt is a 63 y/o female presenting with right shoulder adhesive capsulitis, pain and decreased functioning has been present for approximately 2 months. Pt received steroid injection on 02/27/24 with some mild relief.  PRECAUTIONS: None  WEIGHT BEARING RESTRICTIONS: No  PAIN:  Are you having pain? No  FALLS: Has patient fallen in last 6 months? No  PLOF: Independent  PATIENT GOALS: To have less pain and more mobility  NEXT MD VISIT: 04/23/24  OBJECTIVE:   HAND DOMINANCE: Right  ADLs: Overall ADLs: Pt reports difficulty with sleeping, dressing, reaching overhead and behind her back. Pt has difficulty with caring for granddaughter. Unable to perform lifting tasks, has difficulty with housekeeping, cooking, caregiving for 75 year old granddaughter.    FUNCTIONAL OUTCOME MEASURES: UEFS  Extreme difficulty/unable (0), Quite a bit of difficulty (1), Moderate difficulty (2), Little difficulty (3), No difficulty (4) Survey date:   03/04/24  Any of your usual work, household or school activities 1  2. Your usual hobbies, recreational/sport activities 0   3. Lifting a bag of groceries to waist level 0   4. Lifting a bag of groceries above your head 0  5. Grooming your hair 1  6. Pushing up on your hands (I.e. from bathtub or chair) 1  7. Preparing food (I.e. peeling/cutting) 1  8. Driving  4  9. Vacuuming, sweeping, or raking 3  10. Dressing  1  11. Doing up buttons 2  12. Using  tools/appliances 4  13. Opening doors 1  14. Cleaning  4  15. Tying or lacing shoes 4  16. Sleeping  1  17. Laundering clothes (I.e. washing, ironing, folding) 4  18. Opening a jar 0  19. Throwing a ball 0  20. Carrying a small suitcase with your affected limb.  1  Score total:  33      UPPER EXTREMITY ROM:       Assessed in sitting, er/IR adducted  Active ROM Right eval Right 04/08/24 Right 04/22/24  Shoulder flexion 98 137 144  Shoulder abduction 70 171 166  Shoulder internal rotation 90 90 90  Shoulder external rotation 20 38 38  (Blank rows = not tested)    Assessed in supine, er/IR adducted  Passive ROM Right eval Right 04/22/24  Shoulder flexion 105 146  Shoulder abduction 75 175  Shoulder internal rotation 90 90  Shoulder external rotation 20 31  (Blank rows = not tested)   UPPER EXTREMITY MMT:  Assessed in sitting, er/IR adducted-observation due to pain  MMT Right eval Right 04/08/24 Right 04/22/24  Shoulder flexion 3-/5 4/5 5/5  Shoulder abduction 3-/5 4-/5 4+/5  Shoulder internal rotation 3/5 5/5 5/5  Shoulder external rotation 3-/5 4+/5 5/5  (Blank rows = not tested)  HAND FUNCTION: Decreased due to recent thumb fracture, has HEP  OBSERVATIONS: mod fascial restrictions throughout upper arm, anterior shoulder, trapezius, and scapular regions   TODAY'S TREATMENT:                                                                                                                              DATE:   04/22/24 -manual therapy: myofascial release and trigger point applied to biceps, scapular region, and trapezius in order to reduce pain and fascial restrictions in order to improve ROM; muscle energy technique to flexors and internal rotators -P/ROM: flexion, abduction, er, horizontal abduction, 5 reps -A/ROM: supine-protraction, flexion, abduction, horizontal abduction, er, 12 reps -Shoulder stretches: er, corner stretch, IR with towel behind the back,  2x30 holds -Functional reaching: placing 5 cones on top shelf of overhead cabinet in flexion then removing; repeating in abduction -Cone pass behind back in IR, 10 reps -Ball on wall: flexion, 40 stopped due to pain -x to v arms: 10 reps -Y lift off: 10 reps -Therapy ball circles, 10 reps -Pulleys: 1' flexion -Overhead lacing: seated, lacing from top down then reversing  04/20/24 -manual therapy: myofascial release and trigger point applied to biceps, scapular region, and trapezius in order to reduce pain and fascial restrictions in order to improve ROM; muscle energy technique to flexors and internal rotators -P/ROM: flexion, abduction, er, horizontal abduction, 5 reps -A/ROM: supine-protraction, flexion, abduction, horizontal abduction, er, 12 reps -Proximal shoulder strengthening: supine-paddles, criss cross, circles each direction, 10 reps -Shoulder stretches: flexion, er, corner stretch, IR with towel behind the back, 2x30 holds -A/ROM: standing-protraction, flexion, abduction, horizontal abduction, er, 10 reps -x to v arms: 10 reps -Y lift off: 10 reps -Scapular row: 10 reps -Cone pass behind back in IR, behind head for er, 10 reps -Pulleys: flexion, 1'  04/15/24 -manual therapy: myofascial release and trigger point applied to biceps, scapular region, and trapezius in order to reduce pain and fascial restrictions in order to improve ROM -P/ROM: flexion, abduction, er, horizontal abduction, 5 reps -A/ROM: supine-protraction, flexion, abduction, horizontal abduction, er, 12 reps -Proximal shoulder strengthening: supine-paddles, criss cross, circles each direction, 10 reps -Shoulder stretches: flexion, er, corner stretch, IR with towel behind the back, 2x30 holds -Functional reaching: placing 5 cones on top shelf of overhead cabinet in flexion then removing; repeating in abduction -Cone pass behind back in IR, 10 reps -Scapular theraband: red-row, extension, retraction, 10  reps -Overhead lacing: seated, lacing from top down then reversing    PATIENT EDUCATION: Education details: Continue HEP Person educated: Patient Education method: Programmer, multimedia, Demonstration, and Handouts Education comprehension: verbalized understanding and returned demonstration  HOME EXERCISE PROGRAM: Eval: Scapular A/ROM, table  slides 8/28: Wall Climbs 9/8: A/ROM 9/22: Scapular Strengthening 9/25: Stretching  GOALS: Goals reviewed with patient? Yes   SHORT TERM GOALS: Target date: 04/04/24  Pt will be provided with and educated on HEP to improve mobility in RUE required for use during ADL completion.   Goal status: IN PROGRESS  2.  Pt will increase RUE P/ROM by 30+ degrees to improve ability to use RUE during dressing tasks with minimal compensatory techniques.   Goal status: IN PROGRESS  3.  Pt will increase RUE strength to 3+/5 to improve ability to reach for items at waist to chest height during bathing and grooming tasks.   Goal status: IN PROGRESS    LONG TERM GOALS: Target date: 05/04/24  Pt will decrease pain in RUE to 4/10 or less to improve ability to sleep for 2+ consecutive hours without waking due to pain.   Goal status: IN PROGRESS  2.  Pt will decrease RUE fascial restrictions to min amounts or less to improve mobility required for functional reaching tasks.   Goal status: IN PROGRESS  3.  Pt will increase RUE A/ROM by 45+ degrees to improve ability to use RUE when reaching overhead or behind back during dressing and bathing tasks.   Goal status: IN PROGRESS  4.  Pt will increase RUE strength to 4/5 or greater to improve ability to use RUE when lifting or carrying items during meal preparation/housework/yardwork tasks.   Goal status: IN PROGRESS   ASSESSMENT:  CLINICAL IMPRESSION: Pt reports no pain, continues to have difficulty with IR behind back, however feels it is improving slightly. Measurements taken for MD appt tomorrow, pt with  significant improvement in P/ROM, some improvement in A/ROM, and significant improvement in strength of the RUE. Pt completing shoulder stretches and tasks targeting IR, functional reaching, Heavy focus on form and postural training. Verbal cuing for form and technique during exercises.    PERFORMANCE DEFICITS: in functional skills including in functional skills including ADLs, IADLs, coordination, tone, ROM, strength, pain, fascial restrictions, muscle spasms, and UE functional use   PLAN:  OT FREQUENCY: 2x/week  OT DURATION: 8 weeks  PLANNED INTERVENTIONS: 97168 OT Re-evaluation, 97535 self care/ADL training, 02889 therapeutic exercise, 97530 therapeutic activity, 97112 neuromuscular re-education, 97140 manual therapy, 97014 electrical stimulation unattended, patient/family education, and DME and/or AE instructions  RECOMMENDED OTHER SERVICES: None at this time  CONSULTED AND AGREED WITH PLAN OF CARE: Patient  PLAN FOR NEXT SESSION: Follow up on HEP, manual techniques, passive stretching, A/ROM, scapular mobility and stability work    Sonny Cory, OTR/L  [phone] 04/22/2024, 9:33 AM

## 2024-04-23 ENCOUNTER — Ambulatory Visit: Admitting: Orthopedic Surgery

## 2024-04-23 DIAGNOSIS — M7501 Adhesive capsulitis of right shoulder: Secondary | ICD-10-CM

## 2024-04-23 NOTE — Progress Notes (Signed)
    04/23/2024   Chief Complaint  Patient presents with   Shoulder Pain    6 weeks follow up right shoulder    Encounter Diagnosis  Name Primary?   Adhesive capsulitis of right shoulder Yes    What pharmacy do you use ? [facility] [address]__________  DOI/DOS/ Date: 6 weeks  Improved  63 year old female treated for adhesive capsulitis with physical therapy and subacromial injection has made significant improvements please see the PT notes that I reviewed  She does exhibit improved external rotation and forward elevation still only internally rotating to her back pocket  Recommend she continue home exercise program and formalized physical therapy for 3 visits and return as needed

## 2024-04-23 NOTE — Progress Notes (Signed)
    04/23/2024   No chief complaint on file.   No diagnosis found.  What pharmacy do you use ? [facility] [address]__________  DOI/DOS/ Date: 6 weeks  Improved

## 2024-04-27 ENCOUNTER — Ambulatory Visit (HOSPITAL_COMMUNITY): Admitting: Occupational Therapy

## 2024-04-27 ENCOUNTER — Encounter (HOSPITAL_COMMUNITY): Payer: Self-pay | Admitting: Occupational Therapy

## 2024-04-27 DIAGNOSIS — M25511 Pain in right shoulder: Secondary | ICD-10-CM

## 2024-04-27 DIAGNOSIS — M25611 Stiffness of right shoulder, not elsewhere classified: Secondary | ICD-10-CM

## 2024-04-27 DIAGNOSIS — R29898 Other symptoms and signs involving the musculoskeletal system: Secondary | ICD-10-CM

## 2024-04-27 NOTE — Patient Instructions (Signed)

## 2024-04-27 NOTE — Therapy (Signed)
 OUTPATIENT OCCUPATIONAL THERAPY ORTHO TREATMENT NOTE  Patient Name: Latoya Decker MRN: [id number] DOB:May 29, 1961, 63 y.o., female Today's Date: 04/27/2024   END OF SESSION:  OT End of Session - 04/27/24 1039     Visit Number 14    Number of Visits 16    Date for Recertification  05/03/24    Authorization Type [employer]    Authorization Time Period no auth required; 30 visits combined PT/OT/ST    Authorization - Visit Number 14    Authorization - Number of Visits 30    Progress Note Due on Visit 20    OT Start Time 0905    OT Stop Time 0947    OT Time Calculation (min) 42 min    Activity Tolerance Patient tolerated treatment well    Behavior During Therapy [facility] for tasks assessed/performed             Past Medical History:  Diagnosis Date   Anxiety    Depression    Diverticulosis    Duodenal ulcer 01/05/2010   EGD Dr Starling, h pylori gastritis, completed treatment   Duodenitis    Eosinophilic gastroenteritis JAN 2015   DEC 2014: NL CMP/GIARDIA Ag   Family hx of colon cancer    Mother & sister   Gastritis    hospitalized 6/23-6/25 2011    Headache(784.0)    migraines   Helicobacter pylori gastritis    Hypertension    Nondisplaced fracture of distal phalanx of right thumb with routine healing 10/17/17 11/13/2017   PUD (peptic ulcer disease)    Dr Claudene   S/P colonoscopy 12/27/2003   Dr Petra   S/P endoscopy 12/27/2003   Dr Monalisa superficial ulcers GEJ, duodenitis,gastritis   Past Surgical History:  Procedure Laterality Date   ABDOMINAL HYSTERECTOMY     bilateral tubal ligation  1989   BIOPSY  06/10/2011   SLF: mild gastritis, bx benign, sb bx benign    CHOLECYSTECTOMY  1997   aph-Smith   COLONOSCOPY  05/2011   SLF: multiple hyperplastic sessile polyps, scattered diverticulosis. next TCS 05/2016 ([facility])   COLONOSCOPY N/A 04/21/2017   Dr. Harvey: two 2-45mm colon polyps removed, diverticulosis, hemorrhoids. next TCS in 5 years.     COLONOSCOPY WITH PROPOFOL  N/A 05/21/2022   Procedure: COLONOSCOPY WITH PROPOFOL ;  Surgeon: Cindie Carlin POUR, DO;  Location: [facility];  Service: Endoscopy;  Laterality: N/A;  11:00 am   ESOPHAGOGASTRODUODENOSCOPY N/A 07/28/2013   EOS INOPHILIC GASTROENTERITIS   ESOPHAGOGASTRODUODENOSCOPY N/A 04/21/2017   Dr. Harvey: mild gastritis with H.pylori present, esophageal biopsies negative.    MOLE REMOVAL  09/09/2022   PARTIAL HYSTERECTOMY  2007   1 ovary removed   POLYPECTOMY  06/10/2011   Procedure: POLYPECTOMY;  Surgeon: Margo CHRISTELLA Harvey, MD;  Location: [facility];  Service: Endoscopy;;  Cecal polypectomy; Sigmoid colon polypectomies   POLYPECTOMY  05/21/2022   Procedure: POLYPECTOMY INTESTINAL;  Surgeon: Cindie Carlin POUR, DO;  Location: [facility];  Service: Endoscopy;;   TUBAL LIGATION     Patient Active Problem List   Diagnosis Date Noted   Metabolic dysfunction-associated steatohepatitis (MASH) 04/05/2024   Chronic right shoulder pain 02/18/2024   Acute pain of right shoulder 01/04/2024   Hyperlipidemia LDL goal <100 03/27/2023   Seasonal and perennial allergic rhinitis 10/04/2022   Allergic conjunctivitis of both eyes 10/04/2022   Allergy  with anaphylaxis due to food 10/04/2022   Type 2 diabetes mellitus with other specified complication (HCC) 09/25/2022   Pain of  right thumb 09/19/2022   Multiple food allergies 06/17/2022   Multiple pigmented nevi 06/17/2022   Onychomycosis 05/20/2021   Essential hypertension 11/29/2020   Vitamin D  deficiency 07/04/2019   Helicobacter pylori gastritis 04/16/2018   Tubular adenoma of colon 01/02/2018   Enlarged skin mole 01/02/2018   GERD (gastroesophageal reflux disease) 03/14/2017   Elevated alkaline phosphatase level 10/16/2016   Migraine variant without intractability 08/31/2012   Family history of colon cancer 05/14/2011   Duodenal ulcer without hemorrhage or perforation and without obstruction 01/12/2010   Obesity (BMI 30.0-34.9)  03/30/2008    PCP: Dr. Rollene Pesa  REFERRING PROVIDER: Dr. Taft Minerva  ONSET DATE: ~May 2025  REFERRING DIAG: M75.01 (ICD-10-CM) - Adhesive capsulitis of right shoulder   THERAPY DIAG:  Acute pain of right shoulder  Stiffness of right shoulder, not elsewhere classified  Other symptoms and signs involving the musculoskeletal system  Rationale for Evaluation and Treatment: Rehabilitation  SUBJECTIVE:   SUBJECTIVE STATEMENT: S: Absolutely good, no problems  PERTINENT HISTORY: Pt is a 63 y/o female presenting with right shoulder adhesive capsulitis, pain and decreased functioning has been present for approximately 2 months. Pt received steroid injection on 02/27/24 with some mild relief.  PRECAUTIONS: None  WEIGHT BEARING RESTRICTIONS: No  PAIN:  Are you having pain? No  FALLS: Has patient fallen in last 6 months? No  PLOF: Independent  PATIENT GOALS: To have less pain and more mobility  NEXT MD VISIT: 04/23/24  OBJECTIVE:   HAND DOMINANCE: Right  ADLs: Overall ADLs: Pt reports difficulty with sleeping, dressing, reaching overhead and behind her back. Pt has difficulty with caring for granddaughter. Unable to perform lifting tasks, has difficulty with housekeeping, cooking, caregiving for 41 year old granddaughter.    FUNCTIONAL OUTCOME MEASURES: UEFS  Extreme difficulty/unable (0), Quite a bit of difficulty (1), Moderate difficulty (2), Little difficulty (3), No difficulty (4) Survey date:   03/04/24  Any of your usual work, household or school activities 1  2. Your usual hobbies, recreational/sport activities 0   3. Lifting a bag of groceries to waist level 0   4. Lifting a bag of groceries above your head 0  5. Grooming your hair 1  6. Pushing up on your hands (I.e. from bathtub or chair) 1  7. Preparing food (I.e. peeling/cutting) 1  8. Driving  4  9. Vacuuming, sweeping, or raking 3  10. Dressing  1  11. Doing up buttons 2  12. Using  tools/appliances 4  13. Opening doors 1  14. Cleaning  4  15. Tying or lacing shoes 4  16. Sleeping  1  17. Laundering clothes (I.e. washing, ironing, folding) 4  18. Opening a jar 0  19. Throwing a ball 0  20. Carrying a small suitcase with your affected limb.  1  Score total:  33      UPPER EXTREMITY ROM:       Assessed in sitting, er/IR adducted  Active ROM Right eval Right 04/08/24 Right 04/22/24  Shoulder flexion 98 137 144  Shoulder abduction 70 171 166  Shoulder internal rotation 90 90 90  Shoulder external rotation 20 38 38  (Blank rows = not tested)    Assessed in supine, er/IR adducted  Passive ROM Right eval Right 04/22/24  Shoulder flexion 105 146  Shoulder abduction 75 175  Shoulder internal rotation 90 90  Shoulder external rotation 20 31  (Blank rows = not tested)   UPPER EXTREMITY MMT:     Assessed in  sitting, er/IR adducted-observation due to pain  MMT Right eval Right 04/08/24 Right 04/22/24  Shoulder flexion 3-/5 4/5 5/5  Shoulder abduction 3-/5 4-/5 4+/5  Shoulder internal rotation 3/5 5/5 5/5  Shoulder external rotation 3-/5 4+/5 5/5  (Blank rows = not tested)  HAND FUNCTION: Decreased due to recent thumb fracture, has HEP  OBSERVATIONS: mod fascial restrictions throughout upper arm, anterior shoulder, trapezius, and scapular regions   TODAY'S TREATMENT:                                                                                                                              DATE:   04/27/24 -manual therapy: myofascial release and trigger point applied to biceps, scapular region, and trapezius in order to reduce pain and fascial restrictions in order to improve ROM; muscle energy technique to flexors and internal rotators -P/ROM: flexion, abduction, er, horizontal abduction, 5 reps -A/ROM: supine-protraction, flexion, abduction, horizontal abduction, er, 12 reps -Theraball Exercises: blue kickball, flexion, protraction, overhead press,  V ups, circles both directions, x12 -Ball pass behind the back, x10 both directions, red weighted ball -Scapular Strengthening: red band, extension, retraction, rows, x15 -Shoulder Strengthening: red band, horizontal abduction, er, IR, x12  04/22/24 -manual therapy: myofascial release and trigger point applied to biceps, scapular region, and trapezius in order to reduce pain and fascial restrictions in order to improve ROM; muscle energy technique to flexors and internal rotators -P/ROM: flexion, abduction, er, horizontal abduction, 5 reps -A/ROM: supine-protraction, flexion, abduction, horizontal abduction, er, 12 reps -Shoulder stretches: er, corner stretch, IR with towel behind the back, 2x30 holds -Functional reaching: placing 5 cones on top shelf of overhead cabinet in flexion then removing; repeating in abduction -Cone pass behind back in IR, 10 reps -Ball on wall: flexion, 40 stopped due to pain -x to v arms: 10 reps -Y lift off: 10 reps -Therapy ball circles, 10 reps -Pulleys: 1' flexion -Overhead lacing: seated, lacing from top down then reversing  04/20/24 -manual therapy: myofascial release and trigger point applied to biceps, scapular region, and trapezius in order to reduce pain and fascial restrictions in order to improve ROM; muscle energy technique to flexors and internal rotators -P/ROM: flexion, abduction, er, horizontal abduction, 5 reps -A/ROM: supine-protraction, flexion, abduction, horizontal abduction, er, 12 reps -Proximal shoulder strengthening: supine-paddles, criss cross, circles each direction, 10 reps -Shoulder stretches: flexion, er, corner stretch, IR with towel behind the back, 2x30 holds -A/ROM: standing-protraction, flexion, abduction, horizontal abduction, er, 10 reps -x to v arms: 10 reps -Y lift off: 10 reps -Scapular row: 10 reps -Cone pass behind back in IR, behind head for er, 10 reps -Pulleys: flexion, 1'   PATIENT EDUCATION: 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  Yes   SHORT TERM GOALS: Target date: 04/04/24  Pt will be provided with and educated on HEP to improve mobility in RUE required for use during ADL completion.   Goal status: IN PROGRESS  2.  Pt will increase RUE P/ROM by 30+ degrees to improve ability to use RUE during dressing tasks with minimal compensatory techniques.   Goal status: IN PROGRESS  3.  Pt will increase RUE strength to 3+/5 to improve ability to reach for items at waist to chest height during bathing and grooming tasks.   Goal status: IN PROGRESS    LONG TERM GOALS: Target date: 05/04/24  Pt will decrease pain in RUE to 4/10 or less to improve ability to sleep for 2+ consecutive hours without waking due to pain.   Goal status: IN PROGRESS  2.  Pt will decrease RUE fascial restrictions to min amounts or less to improve mobility required for functional reaching tasks.   Goal status: IN PROGRESS  3.  Pt will increase RUE A/ROM by 45+ degrees to improve ability to use RUE when reaching overhead or behind back during dressing and bathing tasks.   Goal status: IN PROGRESS  4.  Pt will increase RUE strength to 4/5 or greater to improve ability to use RUE when lifting or carrying items during meal preparation/housework/yardwork tasks.   Goal status: IN PROGRESS   ASSESSMENT:  CLINICAL IMPRESSION: This session pt continues to have minimal pain. She also is demonstrating full ROM in supine and 85% full in sitting. Pt is also able to tolerate increased theraband work and theraball exercises. OT providing verbal and  tactile cuing for positioning and technique.    PERFORMANCE DEFICITS: in functional skills including in functional skills including ADLs, IADLs, coordination, tone, ROM, strength, pain, fascial restrictions, muscle spasms, and UE functional use   PLAN:  OT FREQUENCY: 2x/week  OT DURATION: 8 weeks  PLANNED INTERVENTIONS: 97168 OT Re-evaluation, 97535 self care/ADL training, 02889 therapeutic exercise, 97530 therapeutic activity, 97112 neuromuscular re-education, 97140 manual therapy, 97014 electrical stimulation unattended, patient/family education, and DME and/or AE instructions  RECOMMENDED OTHER SERVICES: None at this time  CONSULTED AND AGREED WITH PLAN OF CARE: Patient  PLAN FOR NEXT SESSION: Follow up on HEP, manual techniques, passive stretching, A/ROM, scapular mobility and stability work    Valentin Nightingale, OTR/L  [phone] 04/27/2024, 10:41 AM

## 2024-04-29 ENCOUNTER — Ambulatory Visit (HOSPITAL_COMMUNITY): Admitting: Occupational Therapy

## 2024-04-29 ENCOUNTER — Encounter (HOSPITAL_COMMUNITY): Payer: Self-pay | Admitting: Occupational Therapy

## 2024-04-29 DIAGNOSIS — M25611 Stiffness of right shoulder, not elsewhere classified: Secondary | ICD-10-CM

## 2024-04-29 DIAGNOSIS — M25511 Pain in right shoulder: Secondary | ICD-10-CM | POA: Diagnosis not present

## 2024-04-29 DIAGNOSIS — R29898 Other symptoms and signs involving the musculoskeletal system: Secondary | ICD-10-CM

## 2024-04-29 NOTE — Therapy (Signed)
 OUTPATIENT OCCUPATIONAL THERAPY ORTHO TREATMENT NOTE  Patient Name: Latoya Decker MRN: [id number] DOB:Aug 30, 1960, 63 y.o., female Today's Date: 04/29/2024   END OF SESSION:  OT End of Session - 04/29/24 0937     Visit Number 15    Number of Visits 16    Date for Recertification  05/03/24    Authorization Type [employer]    Authorization Time Period no auth required; 30 visits combined PT/OT/ST    Authorization - Visit Number 15    Authorization - Number of Visits 30    Progress Note Due on Visit 20    OT Start Time [phone]    OT Stop Time 0930    OT Time Calculation (min) 39 min    Activity Tolerance Patient tolerated treatment well    Behavior During Therapy [facility] for tasks assessed/performed              Past Medical History:  Diagnosis Date   Anxiety    Depression    Diverticulosis    Duodenal ulcer 01/05/2010   EGD Dr Starling, h pylori gastritis, completed treatment   Duodenitis    Eosinophilic gastroenteritis JAN 2015   DEC 2014: NL CMP/GIARDIA Ag   Family hx of colon cancer    Mother & sister   Gastritis    hospitalized 6/23-6/25 2011    Headache(784.0)    migraines   Helicobacter pylori gastritis    Hypertension    Nondisplaced fracture of distal phalanx of right thumb with routine healing 10/17/17 11/13/2017   PUD (peptic ulcer disease)    Dr Claudene   S/P colonoscopy 12/27/2003   Dr Petra   S/P endoscopy 12/27/2003   Dr Monalisa superficial ulcers GEJ, duodenitis,gastritis   Past Surgical History:  Procedure Laterality Date   ABDOMINAL HYSTERECTOMY     bilateral tubal ligation  1989   BIOPSY  06/10/2011   SLF: mild gastritis, bx benign, sb bx benign    CHOLECYSTECTOMY  1997   aph-Smith   COLONOSCOPY  05/2011   SLF: multiple hyperplastic sessile polyps, scattered diverticulosis. next TCS 05/2016 ([facility])   COLONOSCOPY N/A 04/21/2017   Dr. Harvey: two 2-18mm colon polyps removed, diverticulosis, hemorrhoids. next TCS in 5 years.     COLONOSCOPY WITH PROPOFOL  N/A 05/21/2022   Procedure: COLONOSCOPY WITH PROPOFOL ;  Surgeon: Cindie Carlin POUR, DO;  Location: [facility];  Service: Endoscopy;  Laterality: N/A;  11:00 am   ESOPHAGOGASTRODUODENOSCOPY N/A 07/28/2013   EOS INOPHILIC GASTROENTERITIS   ESOPHAGOGASTRODUODENOSCOPY N/A 04/21/2017   Dr. Harvey: mild gastritis with H.pylori present, esophageal biopsies negative.    MOLE REMOVAL  09/09/2022   PARTIAL HYSTERECTOMY  2007   1 ovary removed   POLYPECTOMY  06/10/2011   Procedure: POLYPECTOMY;  Surgeon: Margo CHRISTELLA Harvey, MD;  Location: [facility];  Service: Endoscopy;;  Cecal polypectomy; Sigmoid colon polypectomies   POLYPECTOMY  05/21/2022   Procedure: POLYPECTOMY INTESTINAL;  Surgeon: Cindie Carlin POUR, DO;  Location: [facility];  Service: Endoscopy;;   TUBAL LIGATION     Patient Active Problem List   Diagnosis Date Noted   Metabolic dysfunction-associated steatohepatitis (MASH) 04/05/2024   Chronic right shoulder pain 02/18/2024   Acute pain of right shoulder 01/04/2024   Hyperlipidemia LDL goal <100 03/27/2023   Seasonal and perennial allergic rhinitis 10/04/2022   Allergic conjunctivitis of both eyes 10/04/2022   Allergy  with anaphylaxis due to food 10/04/2022   Type 2 diabetes mellitus with other specified complication (HCC) 09/25/2022   Pain  of right thumb 09/19/2022   Multiple food allergies 06/17/2022   Multiple pigmented nevi 06/17/2022   Onychomycosis 05/20/2021   Essential hypertension 11/29/2020   Vitamin D  deficiency 07/04/2019   Helicobacter pylori gastritis 04/16/2018   Tubular adenoma of colon 01/02/2018   Enlarged skin mole 01/02/2018   GERD (gastroesophageal reflux disease) 03/14/2017   Elevated alkaline phosphatase level 10/16/2016   Migraine variant without intractability 08/31/2012   Family history of colon cancer 05/14/2011   Duodenal ulcer without hemorrhage or perforation and without obstruction 01/12/2010   Obesity (BMI 30.0-34.9)  03/30/2008    PCP: Dr. Rollene Pesa  REFERRING PROVIDER: Dr. Taft Minerva  ONSET DATE: ~May 2025  REFERRING DIAG: M75.01 (ICD-10-CM) - Adhesive capsulitis of right shoulder   THERAPY DIAG:  Acute pain of right shoulder  Stiffness of right shoulder, not elsewhere classified  Other symptoms and signs involving the musculoskeletal system  Rationale for Evaluation and Treatment: Rehabilitation  SUBJECTIVE:   SUBJECTIVE STATEMENT: S: My shoulder feels good  PERTINENT HISTORY: Pt is a 63 y/o female presenting with right shoulder adhesive capsulitis, pain and decreased functioning has been present for approximately 2 months. Pt received steroid injection on 02/27/24 with some mild relief.  PRECAUTIONS: None  WEIGHT BEARING RESTRICTIONS: No  PAIN:  Are you having pain? No  FALLS: Has patient fallen in last 6 months? No  PLOF: Independent  PATIENT GOALS: To have less pain and more mobility  NEXT MD VISIT: As needed-none scheduled  OBJECTIVE:   HAND DOMINANCE: Right  ADLs: Overall ADLs: Pt reports difficulty with sleeping, dressing, reaching overhead and behind her back. Pt has difficulty with caring for granddaughter. Unable to perform lifting tasks, has difficulty with housekeeping, cooking, caregiving for 93 year old granddaughter.    FUNCTIONAL OUTCOME MEASURES: UEFS  Extreme difficulty/unable (0), Quite a bit of difficulty (1), Moderate difficulty (2), Little difficulty (3), No difficulty (4) Survey date:   03/04/24  Any of your usual work, household or school activities 1  2. Your usual hobbies, recreational/sport activities 0   3. Lifting a bag of groceries to waist level 0   4. Lifting a bag of groceries above your head 0  5. Grooming your hair 1  6. Pushing up on your hands (I.e. from bathtub or chair) 1  7. Preparing food (I.e. peeling/cutting) 1  8. Driving  4  9. Vacuuming, sweeping, or raking 3  10. Dressing  1  11. Doing up buttons 2  12.  Using tools/appliances 4  13. Opening doors 1  14. Cleaning  4  15. Tying or lacing shoes 4  16. Sleeping  1  17. Laundering clothes (I.e. washing, ironing, folding) 4  18. Opening a jar 0  19. Throwing a ball 0  20. Carrying a small suitcase with your affected limb.  1  Score total:  33      UPPER EXTREMITY ROM:       Assessed in sitting, er/IR adducted  Active ROM Right eval Right 04/08/24 Right 04/22/24  Shoulder flexion 98 137 144  Shoulder abduction 70 171 166  Shoulder internal rotation 90 90 90  Shoulder external rotation 20 38 38  (Blank rows = not tested)    Assessed in supine, er/IR adducted  Passive ROM Right eval Right 04/22/24  Shoulder flexion 105 146  Shoulder abduction 75 175  Shoulder internal rotation 90 90  Shoulder external rotation 20 31  (Blank rows = not tested)   UPPER EXTREMITY MMT:  Assessed in sitting, er/IR adducted-observation due to pain  MMT Right eval Right 04/08/24 Right 04/22/24  Shoulder flexion 3-/5 4/5 5/5  Shoulder abduction 3-/5 4-/5 4+/5  Shoulder internal rotation 3/5 5/5 5/5  Shoulder external rotation 3-/5 4+/5 5/5  (Blank rows = not tested)  HAND FUNCTION: Decreased due to recent thumb fracture, has HEP  OBSERVATIONS: mod fascial restrictions throughout upper arm, anterior shoulder, trapezius, and scapular regions   TODAY'S TREATMENT:                                                                                                                              DATE:   04/29/24 -P/ROM: flexion, abduction, er, horizontal abduction, 5 reps -Strengthening, 2#: supine-protraction, flexion, abduction, horizontal abduction, er, 12 reps -Therapy ball rolls: green therapy ball rolling up wall into flexion, 10 reps -Shoulder stretches: er, corner stretch, IR with towel behind the back, 3x30 holds -A/ROM: standing-protraction, flexion, abduction, horizontal abduction, er, 10 reps -Cone pass behind back in IR, 10 reps -Y  lift off: 10 reps -Functional reaching: placing 10 cones on top shelf of overhead cabinet in flexion then removing; removing in abduction -Overhead lacing: seated, lacing from top down then reversing, 1# wrist weight -Scapular theraband: green-row, extension, retraction, 10 reps UBE: level 2, 3' reverse, pace: 6.0  04/27/24 -manual therapy: myofascial release and trigger point applied to biceps, scapular region, and trapezius in order to reduce pain and fascial restrictions in order to improve ROM; muscle energy technique to flexors and internal rotators -P/ROM: flexion, abduction, er, horizontal abduction, 5 reps -A/ROM: supine-protraction, flexion, abduction, horizontal abduction, er, 12 reps -Theraball Exercises: blue kickball, flexion, protraction, overhead press, V ups, circles both directions, x12 -Ball pass behind the back, x10 both directions, red weighted ball -Scapular Strengthening: red band, extension, retraction, rows, x15 -Shoulder Strengthening: red band, horizontal abduction, er, IR, x12  04/22/24 -manual therapy: myofascial release and trigger point applied to biceps, scapular region, and trapezius in order to reduce pain and fascial restrictions in order to improve ROM; muscle energy technique to flexors and internal rotators -P/ROM: flexion, abduction, er, horizontal abduction, 5 reps -A/ROM: supine-protraction, flexion, abduction, horizontal abduction, er, 12 reps -Shoulder stretches: er, corner stretch, IR with towel behind the back, 2x30 holds -Functional reaching: placing 5 cones on top shelf of overhead cabinet in flexion then removing; repeating in abduction -Cone pass behind back in IR, 10 reps -Ball on wall: flexion, 40 stopped due to pain -x to v arms: 10 reps -Y lift off: 10 reps -Therapy ball circles, 10 reps -Pulleys: 1' flexion -Overhead lacing: seated, lacing from top down then reversing    PATIENT EDUCATION: Education details: Reviewed HEP Person  educated: Patient Education method: Explanation, Demonstration, and Handouts Education comprehension: verbalized understanding and returned demonstration  HOME EXERCISE PROGRAM: Eval: Scapular A/ROM, table slides 8/28: Wall Climbs 9/8: A/ROM 9/22: Scapular Strengthening 9/25: Stretching 10/14: Shoulder Strengthening  GOALS: Goals reviewed with patient? Yes  SHORT TERM GOALS: Target date: 04/04/24  Pt will be provided with and educated on HEP to improve mobility in RUE required for use during ADL completion.   Goal status: IN PROGRESS  2.  Pt will increase RUE P/ROM by 30+ degrees to improve ability to use RUE during dressing tasks with minimal compensatory techniques.   Goal status: IN PROGRESS  3.  Pt will increase RUE strength to 3+/5 to improve ability to reach for items at waist to chest height during bathing and grooming tasks.   Goal status: IN PROGRESS    LONG TERM GOALS: Target date: 05/04/24  Pt will decrease pain in RUE to 4/10 or less to improve ability to sleep for 2+ consecutive hours without waking due to pain.   Goal status: IN PROGRESS  2.  Pt will decrease RUE fascial restrictions to min amounts or less to improve mobility required for functional reaching tasks.   Goal status: IN PROGRESS  3.  Pt will increase RUE A/ROM by 45+ degrees to improve ability to use RUE when reaching overhead or behind back during dressing and bathing tasks.   Goal status: IN PROGRESS  4.  Pt will increase RUE strength to 4/5 or greater to improve ability to use RUE when lifting or carrying items during meal preparation/housework/yardwork tasks.   Goal status: IN PROGRESS   ASSESSMENT:  CLINICAL IMPRESSION: Pt reports her shoulder is feeling great, has been completing her HEP. Pt with min fascial restrictions in the anterior shoulder, mod trigger point in trapezius. Pt with A/ROM to approximately 75-80%, continues to be the most limited with IR behind the back.  Progressed to strengthening in supine, continued with stretching and A/ROM in standing. Added flexion stretch with therapy ball. Pt tolerating all activities well, focus on posture and form during session, targeting improved use of RUE during ADLs. Verbal, visual, and min tactile cuing for form and technique.    PERFORMANCE DEFICITS: in functional skills including in functional skills including ADLs, IADLs, coordination, tone, ROM, strength, pain, fascial restrictions, muscle spasms, and UE functional use   PLAN:  OT FREQUENCY: 2x/week  OT DURATION: 8 weeks  PLANNED INTERVENTIONS: 97168 OT Re-evaluation, 97535 self care/ADL training, 02889 therapeutic exercise, 97530 therapeutic activity, 97112 neuromuscular re-education, 97140 manual therapy, 97014 electrical stimulation unattended, patient/family education, and DME and/or AE instructions  RECOMMENDED OTHER SERVICES: None at this time  CONSULTED AND AGREED WITH PLAN OF CARE: Patient  PLAN FOR NEXT SESSION: Reassess, discharge with HEP    Sonny Cory, OTR/L  [phone] 04/29/2024, 9:38 AM

## 2024-04-30 ENCOUNTER — Encounter: Payer: Self-pay | Admitting: Family Medicine

## 2024-05-03 NOTE — Telephone Encounter (Signed)
 See other message said needs to see ortho for these forms.

## 2024-05-03 NOTE — Telephone Encounter (Signed)
 Called patient she will have [employer] resend the forms that Dr Antonetta needs to fill out. Per patient different than for ortho.

## 2024-05-05 ENCOUNTER — Ambulatory Visit: Admitting: Gastroenterology

## 2024-05-05 ENCOUNTER — Encounter (HOSPITAL_COMMUNITY): Payer: Self-pay | Admitting: Occupational Therapy

## 2024-05-05 ENCOUNTER — Ambulatory Visit (HOSPITAL_COMMUNITY): Payer: Self-pay | Admitting: Occupational Therapy

## 2024-05-05 DIAGNOSIS — M25511 Pain in right shoulder: Secondary | ICD-10-CM

## 2024-05-05 DIAGNOSIS — R29898 Other symptoms and signs involving the musculoskeletal system: Secondary | ICD-10-CM

## 2024-05-05 DIAGNOSIS — M25611 Stiffness of right shoulder, not elsewhere classified: Secondary | ICD-10-CM

## 2024-05-05 NOTE — Therapy (Signed)
 OUTPATIENT OCCUPATIONAL THERAPY ORTHO TREATMENT NOTE  Patient Name: Latoya Decker MRN: [id number] DOB:[birth year], 63 y.o., female Today's Date: 05/05/2024  OCCUPATIONAL THERAPY DISCHARGE SUMMARY  Visits from Start of Care: 16  Current functional level related to goals / functional outcomes: Pt has met all OT goals.    Remaining deficits: Pt has no remaining OT deficits.   Education / Equipment: Pt provided comprehensive HEP.    Plan: Patient agrees to discharge as all OT goals have been met.       END OF SESSION:  OT End of Session - 05/05/24 1241     Visit Number 16    Number of Visits 16    Date for Recertification  05/03/24    Authorization Type [employer]    Authorization Time Period no auth required; 30 visits combined PT/OT/ST    Authorization - Visit Number 16    Authorization - Number of Visits 30    Progress Note Due on Visit 20    OT Start Time 1055    OT Stop Time 1118    OT Time Calculation (min) 23 min    Activity Tolerance Patient tolerated treatment well    Behavior During Therapy WFL for tasks assessed/performed          Past Medical History:  Diagnosis Date   Anxiety    Depression    Diverticulosis    Duodenal ulcer 01/05/2010   EGD Dr Starling, h pylori gastritis, completed treatment   Duodenitis    Eosinophilic gastroenteritis JAN 2015   DEC 2014: NL CMP/GIARDIA Ag   Family hx of colon cancer    Mother & sister   Gastritis    hospitalized 6/23-6/25 2011    Headache(784.0)    migraines   Helicobacter pylori gastritis    Hypertension    Nondisplaced fracture of distal phalanx of right thumb with routine healing 10/17/17 11/13/2017   PUD (peptic ulcer disease)    Dr Claudene   S/P colonoscopy 12/27/2003   Dr Petra   S/P endoscopy 12/27/2003   Dr Monalisa superficial ulcers GEJ, duodenitis,gastritis   Past Surgical History:  Procedure Laterality Date   ABDOMINAL HYSTERECTOMY     bilateral tubal ligation  1989    BIOPSY  06/10/2011   SLF: mild gastritis, bx benign, sb bx benign    CHOLECYSTECTOMY  1997   aph-Smith   COLONOSCOPY  05/2011   SLF: multiple hyperplastic sessile polyps, scattered diverticulosis. next TCS 05/2016 ([facility])   COLONOSCOPY N/A 04/21/2017   Dr. Harvey: two 2-45mm colon polyps removed, diverticulosis, hemorrhoids. next TCS in 5 years.    COLONOSCOPY WITH PROPOFOL  N/A 05/21/2022   Procedure: COLONOSCOPY WITH PROPOFOL ;  Surgeon: Cindie Carlin POUR, DO;  Location: [facility];  Service: Endoscopy;  Laterality: N/A;  11:00 am   ESOPHAGOGASTRODUODENOSCOPY N/A 07/28/2013   EOS INOPHILIC GASTROENTERITIS   ESOPHAGOGASTRODUODENOSCOPY N/A 04/21/2017   Dr. Harvey: mild gastritis with H.pylori present, esophageal biopsies negative.    MOLE REMOVAL  09/09/2022   PARTIAL HYSTERECTOMY  2007   1 ovary removed   POLYPECTOMY  06/10/2011   Procedure: POLYPECTOMY;  Surgeon: Margo CHRISTELLA Harvey, MD;  Location: [facility];  Service: Endoscopy;;  Cecal polypectomy; Sigmoid colon polypectomies   POLYPECTOMY  05/21/2022   Procedure: POLYPECTOMY INTESTINAL;  Surgeon: Cindie Carlin POUR, DO;  Location: [facility];  Service: Endoscopy;;   TUBAL LIGATION     Patient Active Problem List   Diagnosis Date Noted   Metabolic dysfunction-associated steatohepatitis (MASH)  04/05/2024   Chronic right shoulder pain 02/18/2024   Acute pain of right shoulder 01/04/2024   Hyperlipidemia LDL goal <100 03/27/2023   Seasonal and perennial allergic rhinitis 10/04/2022   Allergic conjunctivitis of both eyes 10/04/2022   Allergy  with anaphylaxis due to food 10/04/2022   Type 2 diabetes mellitus with other specified complication (HCC) 09/25/2022   Pain of right thumb 09/19/2022   Multiple food allergies 06/17/2022   Multiple pigmented nevi 06/17/2022   Onychomycosis 05/20/2021   Essential hypertension 11/29/2020   Vitamin D  deficiency 07/04/2019   Helicobacter pylori gastritis 04/16/2018   Tubular adenoma of colon  01/02/2018   Enlarged skin mole 01/02/2018   GERD (gastroesophageal reflux disease) 03/14/2017   Elevated alkaline phosphatase level 10/16/2016   Migraine variant without intractability 08/31/2012   Family history of colon cancer 05/14/2011   Duodenal ulcer without hemorrhage or perforation and without obstruction 01/12/2010   Obesity (BMI 30.0-34.9) 03/30/2008    PCP: Dr. Rollene Pesa  REFERRING PROVIDER: Dr. Taft Minerva  ONSET DATE: ~May 2025  REFERRING DIAG: M75.01 (ICD-10-CM) - Adhesive capsulitis of right shoulder   THERAPY DIAG:  Acute pain of right shoulder  Stiffness of right shoulder, not elsewhere classified  Other symptoms and signs involving the musculoskeletal system  Rationale for Evaluation and Treatment: Rehabilitation  SUBJECTIVE:   SUBJECTIVE STATEMENT: S: My shoulder feels good  PERTINENT HISTORY: Pt is a 63 y/o female presenting with right shoulder adhesive capsulitis, pain and decreased functioning has been present for approximately 2 months. Pt received steroid injection on 02/27/24 with some mild relief.  PRECAUTIONS: None  WEIGHT BEARING RESTRICTIONS: No  PAIN:  Are you having pain? No  FALLS: Has patient fallen in last 6 months? No  PLOF: Independent  PATIENT GOALS: To have less pain and more mobility  NEXT MD VISIT: As needed-none scheduled  OBJECTIVE:   HAND DOMINANCE: Right  ADLs: Overall ADLs: Pt reports difficulty with sleeping, dressing, reaching overhead and behind her back. Pt has difficulty with caring for granddaughter. Unable to perform lifting tasks, has difficulty with housekeeping, cooking, caregiving for 53 year old granddaughter.    FUNCTIONAL OUTCOME MEASURES: UEFS  Extreme difficulty/unable (0), Quite a bit of difficulty (1), Moderate difficulty (2), Little difficulty (3), No difficulty (4) Survey date:   03/04/24 05/05/24  Any of your usual work, household or school activities 1 2  2. Your usual hobbies,  recreational/sport activities 0 2   3. Lifting a bag of groceries to waist level 0 4   4. Lifting a bag of groceries above your head 0 2  5. Grooming your hair 1 2  6. Pushing up on your hands (I.e. from bathtub or chair) 1 4  7. Preparing food (I.e. peeling/cutting) 1 4  8. Driving  4 4  9. Vacuuming, sweeping, or raking 3 4  10. Dressing  1 4  11. Doing up buttons 2 4  12. Using tools/appliances 4 4  13. Opening doors 1 3  14. Cleaning  4 4  15. Tying or lacing shoes 4 4  16. Sleeping  1 4  17. Laundering clothes (I.e. washing, ironing, folding) 4 4  18. Opening a jar 0 0  19. Throwing a ball 0 2  20. Carrying a small suitcase with your affected limb.  1 2  Score total:  33 63/80      UPPER EXTREMITY ROM:       Assessed in sitting, er/IR adducted  Active ROM Right eval Right 04/08/24  Right 04/22/24 Right 05/05/24  Shoulder flexion 98 137 144 146  Shoulder abduction 70 171 166 166  Shoulder internal rotation 90 90 90 90  Shoulder external rotation 20 38 38 43  (Blank rows = not tested)    Assessed in supine, er/IR adducted  Passive ROM Right eval Right 04/22/24  Shoulder flexion 105 146  Shoulder abduction 75 175  Shoulder internal rotation 90 90  Shoulder external rotation 20 31  (Blank rows = not tested)   UPPER EXTREMITY MMT:     Assessed in sitting, er/IR adducted-observation due to pain  MMT Right eval Right 04/08/24 Right 04/22/24 Right 05/05/24  Shoulder flexion 3-/5 4/5 5/5 5/5  Shoulder abduction 3-/5 4-/5 4+/5 5/5  Shoulder internal rotation 3/5 5/5 5/5 5/5  Shoulder external rotation 3-/5 4+/5 5/5 5/5  (Blank rows = not tested)  HAND FUNCTION: Decreased due to recent thumb fracture, has HEP  OBSERVATIONS: mod fascial restrictions throughout upper arm, anterior shoulder, trapezius, and scapular regions   TODAY'S TREATMENT:                                                                                                                               DATE:   05/05/24 -P/ROM: flexion, abduction, er, horizontal abduction, 5 reps -A/ROM: supine-protraction, flexion, abduction, horizontal abduction, er, 12 reps -Reviewed Stretches -Ball pass behind back  04/29/24 -P/ROM: flexion, abduction, er, horizontal abduction, 5 reps -Strengthening, 2#: supine-protraction, flexion, abduction, horizontal abduction, er, 12 reps -Therapy ball rolls: green therapy ball rolling up wall into flexion, 10 reps -Shoulder stretches: er, corner stretch, IR with towel behind the back, 3x30 holds -A/ROM: standing-protraction, flexion, abduction, horizontal abduction, er, 10 reps -Cone pass behind back in IR, 10 reps -Y lift off: 10 reps -Functional reaching: placing 10 cones on top shelf of overhead cabinet in flexion then removing; removing in abduction -Overhead lacing: seated, lacing from top down then reversing, 1# wrist weight -Scapular theraband: green-row, extension, retraction, 10 reps UBE: level 2, 3' reverse, pace: 6.0  04/27/24 -manual therapy: myofascial release and trigger point applied to biceps, scapular region, and trapezius in order to reduce pain and fascial restrictions in order to improve ROM; muscle energy technique to flexors and internal rotators -P/ROM: flexion, abduction, er, horizontal abduction, 5 reps -A/ROM: supine-protraction, flexion, abduction, horizontal abduction, er, 12 reps -Theraball Exercises: blue kickball, flexion, protraction, overhead press, V ups, circles both directions, x12 -Ball pass behind the back, x10 both directions, red weighted ball -Scapular Strengthening: red band, extension, retraction, rows, x15 -Shoulder Strengthening: red band, horizontal abduction, er, IR, x12  04/22/24 -manual therapy: myofascial release and trigger point applied to biceps, scapular region, and trapezius in order to reduce pain and fascial restrictions in order to improve ROM; muscle energy technique to flexors and internal  rotators -P/ROM: flexion, abduction, er, horizontal abduction, 5 reps -A/ROM: supine-protraction, flexion, abduction, horizontal abduction, er, 12 reps -Shoulder stretches: er, corner stretch, IR with towel behind  the back, 2x30 holds -Functional reaching: placing 5 cones on top shelf of overhead cabinet in flexion then removing; repeating in abduction -Cone pass behind back in IR, 10 reps -Ball on wall: flexion, 40 stopped due to pain -x to v arms: 10 reps -Y lift off: 10 reps -Therapy ball circles, 10 reps -Pulleys: 1' flexion -Overhead lacing: seated, lacing from top down then reversing    PATIENT EDUCATION: Education details: Reviewed HEP Person educated: Patient Education method: Explanation, Demonstration, and Handouts Education comprehension: verbalized understanding and returned demonstration  HOME EXERCISE PROGRAM: Eval: Scapular A/ROM, table slides 8/28: Wall Climbs 9/8: A/ROM 9/22: Scapular Strengthening 9/25: Stretching 10/14: Shoulder Strengthening  GOALS: Goals reviewed with patient? Yes   SHORT TERM GOALS: Target date: 04/04/24  Pt will be provided with and educated on HEP to improve mobility in RUE required for use during ADL completion.   Goal status: MET  2.  Pt will increase RUE P/ROM by 30+ degrees to improve ability to use RUE during dressing tasks with minimal compensatory techniques.   Goal status: MET  3.  Pt will increase RUE strength to 3+/5 to improve ability to reach for items at waist to chest height during bathing and grooming tasks.   Goal status: MET    LONG TERM GOALS: Target date: 05/04/24  Pt will decrease pain in RUE to 4/10 or less to improve ability to sleep for 2+ consecutive hours without waking due to pain.   Goal status: MET  2.  Pt will decrease RUE fascial restrictions to min amounts or less to improve mobility required for functional reaching tasks.   Goal status: MET  3.  Pt will increase RUE A/ROM by 45+  degrees to improve ability to use RUE when reaching overhead or behind back during dressing and bathing tasks.   Goal status: MET  4.  Pt will increase RUE strength to 4/5 or greater to improve ability to use RUE when lifting or carrying items during meal preparation/housework/yardwork tasks.   Goal status: MET   ASSESSMENT:  CLINICAL IMPRESSION: Pt completed Reassessment for discharge this session. She demonstrated functional ROM and full 5/5 strength. Pt feels she is able to continue working on her HEP and improving her arm at home. She has no further skilled OT needs and will be discharged from OP OT.   PERFORMANCE DEFICITS: in functional skills including in functional skills including ADLs, IADLs, coordination, tone, ROM, strength, pain, fascial restrictions, muscle spasms, and UE functional use   PLAN:  OT FREQUENCY: 2x/week  OT DURATION: 8 weeks  PLANNED INTERVENTIONS: 97168 OT Re-evaluation, 97535 self care/ADL training, 02889 therapeutic exercise, 97530 therapeutic activity, 97112 neuromuscular re-education, 97140 manual therapy, 97014 electrical stimulation unattended, patient/family education, and DME and/or AE instructions  RECOMMENDED OTHER SERVICES: None at this time  CONSULTED AND AGREED WITH PLAN OF CARE: Patient  PLAN FOR NEXT SESSION: Discharge    Valentin Nightingale, OTR/L  [phone] 05/05/2024, 12:43 PM

## 2024-05-12 NOTE — Telephone Encounter (Signed)
 Patient picked up forms.

## 2024-05-14 ENCOUNTER — Telehealth: Payer: Self-pay | Admitting: Orthopedic Surgery

## 2024-05-14 NOTE — Telephone Encounter (Signed)
 IC patient, lmvm advised to [facility] to further discuss our office not being able to complete her [place] form. I spoke with pt on 03/17/24 advising that Dr. Margrette did not take her out of work she stated she has been out due to hand. I advised that the MD that took her out needs to complete the forms. At that time she expressed understanding.

## 2024-05-17 ENCOUNTER — Encounter: Payer: Self-pay | Admitting: Radiology

## 2024-05-17 NOTE — Telephone Encounter (Signed)
 SABRA

## 2024-06-03 ENCOUNTER — Ambulatory Visit (INDEPENDENT_AMBULATORY_CARE_PROVIDER_SITE_OTHER): Admitting: Family Medicine

## 2024-06-03 ENCOUNTER — Encounter: Payer: Self-pay | Admitting: Family Medicine

## 2024-06-03 VITALS — BP 138/80 | HR 85 | Resp 16 | Ht 64.0 in | Wt 202.0 lb

## 2024-06-03 DIAGNOSIS — E66811 Obesity, class 1: Secondary | ICD-10-CM | POA: Diagnosis not present

## 2024-06-03 DIAGNOSIS — E1169 Type 2 diabetes mellitus with other specified complication: Secondary | ICD-10-CM | POA: Diagnosis not present

## 2024-06-03 DIAGNOSIS — I10 Essential (primary) hypertension: Secondary | ICD-10-CM

## 2024-06-03 DIAGNOSIS — E785 Hyperlipidemia, unspecified: Secondary | ICD-10-CM | POA: Diagnosis not present

## 2024-06-03 DIAGNOSIS — Z130 Encounter for screening for diseases of the blood and blood-forming organs and certain disorders involving the immune mechanism: Secondary | ICD-10-CM

## 2024-06-03 NOTE — Assessment & Plan Note (Signed)
  Patient re-educated about  the importance of commitment to a  minimum of 150 minutes of exercise per week as able.  The importance of healthy food choices with portion control discussed, as well as eating regularly and within a 12 hour window most days. The need to choose clean , green food 50 to 75% of the time is discussed, as well as to make water  the primary drink and set a goal of 64 ounces water  daily.       06/03/2024    1:30 PM 04/05/2024   10:43 AM 02/27/2024    9:46 AM  Weight /BMI  Weight 202 lb 0.6 oz 206 lb 3.2 oz 203 lb  Height 5' 4 (1.626 m) 5' 6 (1.676 m) 5' 6 (1.676 m)  BMI 34.68 kg/m2 33.28 kg/m2 32.77 kg/m2    Will change to mounjaro  once ins will cover , she will message me

## 2024-06-03 NOTE — Assessment & Plan Note (Signed)
 Controlled, no change in medication DASH diet and commitment to daily physical activity for a minimum of 30 minutes discussed and encouraged, as a part of hypertension management. The importance of attaining a healthy weight is also discussed.     06/03/2024    2:03 PM 06/03/2024    1:30 PM 04/05/2024   10:43 AM 02/27/2024    9:46 AM 02/18/2024    1:01 PM 01/02/2024    1:09 PM 01/02/2024   12:58 PM  BP/Weight  Systolic BP 138 141 129 131 131 160 160  Diastolic BP 80 79 75 78 78 88 87  Wt. (Lbs)  202.04 206.2 203 203.12  211.02  BMI  34.68 kg/m2 33.28 kg/m2 32.77 kg/m2 32.78 kg/m2  34.06 kg/m2

## 2024-06-03 NOTE — Assessment & Plan Note (Signed)
 Diabetes associated with hypertension, hyperlipidemia, and obesity  [name] is reminded of the importance of commitment to daily physical activity for 30 minutes or more, as able and the need to limit carbohydrate intake to 30 to 60 grams per meal to help with blood sugar control.   The need to take medication as prescribed, test blood sugar as directed, and to call between visits if there is a concern that blood sugar is uncontrolled is also discussed.   [name] is reminded of the importance of daily foot exam, annual eye examination, and good blood sugar, blood pressure and cholesterol control.     Latest Ref Rng & Units 01/15/2024   10:45 AM 05/21/2023    1:16 PM 03/25/2023   11:48 AM 02/04/2023    2:49 PM 09/19/2022    4:33 PM  Diabetic Labs  HbA1c 4.8 - 5.6 % 7.7  6.9   7.4  6.8   Micro/Creat Ratio 0 - 29 mg/g creat   10     Chol 100 - 199 mg/dL 832   877     HDL >60 mg/dL 43   44     Calc LDL 0 - 99 mg/dL 95   54     Triglycerides 0 - 149 mg/dL 831   861     Creatinine 0.57 - 1.00 mg/dL 8.93   9.09   9.09       06/03/2024    2:03 PM 06/03/2024    1:30 PM 04/05/2024   10:43 AM 02/27/2024    9:46 AM 02/18/2024    1:01 PM 01/02/2024    1:09 PM 01/02/2024   12:58 PM  BP/Weight  Systolic BP 138 141 129 131 131 160 160  Diastolic BP 80 79 75 78 78 88 87  Wt. (Lbs)  202.04 206.2 203 203.12  211.02  BMI  34.68 kg/m2 33.28 kg/m2 32.77 kg/m2 32.78 kg/m2  34.06 kg/m2      02/18/2024    1:00 PM 06/19/2023   11:26 AM  Foot/eye exam completion dates  Eye Exam  --      Foot Form Completion Done      This result is from an external source.      Updated lab needed at/ before next visit.

## 2024-06-03 NOTE — Assessment & Plan Note (Signed)
 Hyperlipidemia:Low fat diet discussed and encouraged.   Lipid Panel  Lab Results  Component Value Date   CHOL 167 01/15/2024   HDL 43 01/15/2024   LDLCALC 95 01/15/2024   TRIG 168 (H) 01/15/2024   CHOLHDL 3.9 01/15/2024    \ Updated lab needed at/ before next visit.

## 2024-06-03 NOTE — Patient Instructions (Addendum)
 Annual exam in 4 months  Labs already ordered today pls, also add urine ACR and sickle cell screening test and CBC, will message you the results pls respond if requested  Pls send a message to your GI Provider re the concern voiced   Pls schedule mammogram at checkout  Will start mounjaro  once you get your new ins let me know , to swap out the metformin   It is important that you exercise regularly at least 30 minutes 5 times a week. If you develop chest pain, have severe difficulty breathing, or feel very tired, stop exercising immediately and seek medical attention   Think about what you will eat, plan ahead. Choose  clean, green, fresh or frozen over canned, processed or packaged foods which are more sugary, salty and fatty. 70 to 75% of food eaten should be vegetables and fruit. Three meals at set times with snacks allowed between meals, but they must be fruit or vegetables. Aim to eat over a 12 hour period , example 7 am to 7 pm, and STOP after  your last meal of the day. Drink water ,generally about 64 ounces per day, no other drink is as healthy. Fruit juice is best enjoyed in a healthy way, by EATING the fruit. Thanks for choosing [facility], we consider it a privelige to serve you.

## 2024-06-03 NOTE — Progress Notes (Signed)
 Latoya Decker     MRN: [id number]      DOB: 02/08/1961  Chief Complaint  Patient presents with   Hypertension    Follow up     HPI Latoya Decker is here for follow up and re-evaluation of chronic medical conditions, medication management and review of any available recent lab and radiology data.  Preventive health is updated, specifically  Cancer screening and Immunization.   Questions or concerns regarding consultations or procedures which the PT has had in the interim are  addressed.Awaiting f/u re medication mentioned at GI visit due to elevated alk phos States 2 eye specialists have asked if she has sS disease or trait, no known family history will do screen The PT denies any adverse reactions to current medications since the last visit. Wants to resume mounjaro  once she has insurance that will cover it states metformin  causes excess stool   ROS Denies recent fever or chills. Denies sinus pressure, nasal congestion, ear pain or sore throat. Denies chest congestion, productive cough or wheezing. Denies chest pains, palpitations and leg swelling Denies abdominal pain, nausea, vomiting,diarrhea or constipation.   Denies dysuria, frequency, hesitancy or incontinence. Denies joint pain, swelling and limitation in mobility. Denies headaches, seizures, numbness, or tingling. Denies depression, anxiety or insomnia. Denies skin break down or rash.   PE  BP 138/80   Pulse 85   Resp 16   Ht 5' 4 (1.626 m)   Wt 202 lb 0.6 oz (91.6 kg)   SpO2 97%   BMI 34.68 kg/m   Patient alert and oriented and in no cardiopulmonary distress.  HEENT: No facial asymmetry, EOMI,     Neck supple .  Chest: Clear to auscultation bilaterally.  CVS: S1, S2 no murmurs, no S3.Regular rate.  ABD: Soft non tender.   Ext: No edema  MS: Adequate ROM spine, shoulders, hips and knees.  Skin: Intact, no ulcerations or rash noted.  Psych: Good eye contact, normal affect. Memory intact not anxious  or depressed appearing.  CNS: CN 2-12 intact, power,  normal throughout.no focal deficits noted.   Assessment & Plan  Essential hypertension Controlled, no change in medication DASH diet and commitment to daily physical activity for a minimum of 30 minutes discussed and encouraged, as a part of hypertension management. The importance of attaining a healthy weight is also discussed.     06/03/2024    2:03 PM 06/03/2024    1:30 PM 04/05/2024   10:43 AM 02/27/2024    9:46 AM 02/18/2024    1:01 PM 01/02/2024    1:09 PM 01/02/2024   12:58 PM  BP/Weight  Systolic BP 138 141 129 131 131 160 160  Diastolic BP 80 79 75 78 78 88 87  Wt. (Lbs)  202.04 206.2 203 203.12  211.02  BMI  34.68 kg/m2 33.28 kg/m2 32.77 kg/m2 32.78 kg/m2  34.06 kg/m2       Hyperlipidemia LDL goal <100 Hyperlipidemia:Low fat diet discussed and encouraged.   Lipid Panel  Lab Results  Component Value Date   CHOL 167 01/15/2024   HDL 43 01/15/2024   LDLCALC 95 01/15/2024   TRIG 168 (H) 01/15/2024   CHOLHDL 3.9 01/15/2024    \ Updated lab needed at/ before next visit.   Obesity (BMI 30.0-34.9)  Patient re-educated about  the importance of commitment to a  minimum of 150 minutes of exercise per week as able.  The importance of healthy food choices with portion control discussed, as well as  eating regularly and within a 12 hour window most days. The need to choose clean , green food 50 to 75% of the time is discussed, as well as to make water  the primary drink and set a goal of 64 ounces water  daily.       06/03/2024    1:30 PM 04/05/2024   10:43 AM 02/27/2024    9:46 AM  Weight /BMI  Weight 202 lb 0.6 oz 206 lb 3.2 oz 203 lb  Height 5' 4 (1.626 m) 5' 6 (1.676 m) 5' 6 (1.676 m)  BMI 34.68 kg/m2 33.28 kg/m2 32.77 kg/m2    Will change to mounjaro  once ins will cover , she will message me  Type 2 diabetes mellitus with other specified complication (HCC) Diabetes associated with hypertension,  hyperlipidemia, and obesity  Latoya Decker is reminded of the importance of commitment to daily physical activity for 30 minutes or more, as able and the need to limit carbohydrate intake to 30 to 60 grams per meal to help with blood sugar control.   The need to take medication as prescribed, test blood sugar as directed, and to call between visits if there is a concern that blood sugar is uncontrolled is also discussed.   Latoya Decker is reminded of the importance of daily foot exam, annual eye examination, and good blood sugar, blood pressure and cholesterol control.     Latest Ref Rng & Units 01/15/2024   10:45 AM 05/21/2023    1:16 PM 03/25/2023   11:48 AM 02/04/2023    2:49 PM 09/19/2022    4:33 PM  Diabetic Labs  HbA1c 4.8 - 5.6 % 7.7  6.9   7.4  6.8   Micro/Creat Ratio 0 - 29 mg/g creat   10     Chol 100 - 199 mg/dL 832   877     HDL >60 mg/dL 43   44     Calc LDL 0 - 99 mg/dL 95   54     Triglycerides 0 - 149 mg/dL 831   861     Creatinine 0.57 - 1.00 mg/dL 8.93   9.09   9.09       06/03/2024    2:03 PM 06/03/2024    1:30 PM 04/05/2024   10:43 AM 02/27/2024    9:46 AM 02/18/2024    1:01 PM 01/02/2024    1:09 PM 01/02/2024   12:58 PM  BP/Weight  Systolic BP 138 141 129 131 131 160 160  Diastolic BP 80 79 75 78 78 88 87  Wt. (Lbs)  202.04 206.2 203 203.12  211.02  BMI  34.68 kg/m2 33.28 kg/m2 32.77 kg/m2 32.78 kg/m2  34.06 kg/m2      02/18/2024    1:00 PM 06/19/2023   11:26 AM  Foot/eye exam completion dates  Eye Exam  --      Foot Form Completion Done      This result is from an external source.      Updated lab needed at/ before next visit.   Screening for sickle-cell disease or trait Lab ordered based on report from pt following eye exams

## 2024-06-03 NOTE — Assessment & Plan Note (Signed)
 Lab ordered based on report from pt following eye exams

## 2024-06-04 ENCOUNTER — Ambulatory Visit: Payer: Self-pay | Admitting: Family Medicine

## 2024-06-04 MED ORDER — URSODIOL 500 MG PO TABS: ORAL_TABLET | ORAL | 5 refills | Status: AC

## 2024-06-04 NOTE — Telephone Encounter (Signed)
 Latoya Decker,   Please arrange for LFTs in 3 months.   Latoya Decker,   Please arrange for ov in 3 months.

## 2024-06-04 NOTE — Telephone Encounter (Signed)
 Dr. Antonetta,   The hepatologist in [place] suggested considering underlying bone loss/disease as potential contributor to [name]'s elevated alkaline phosphatase as her isoenzyme bone fraction was relative high. I will leave this to your discretion.    She believes MASH and diabetes also contributing to elevated alk phos but did agree to trial of ursodiol  for possible AMA negative PBC. We will plan to recheck LFTs in 3 months on the medication. I will make arrangements for follow up as well.  Latoya Decker

## 2024-06-05 LAB — CMP14+EGFR
ALT: 18 IU/L (ref 0–32)
AST: 21 IU/L (ref 0–40)
Albumin: 4.6 g/dL (ref 3.9–4.9)
Alkaline Phosphatase: 175 IU/L — ABNORMAL HIGH (ref 49–135)
BUN/Creatinine Ratio: 13 (ref 12–28)
BUN: 12 mg/dL (ref 8–27)
Bilirubin Total: 0.4 mg/dL (ref 0.0–1.2)
CO2: 22 mmol/L (ref 20–29)
Calcium: 9.7 mg/dL (ref 8.7–10.3)
Chloride: 107 mmol/L — ABNORMAL HIGH (ref 96–106)
Creatinine, Ser: 0.89 mg/dL (ref 0.57–1.00)
Globulin, Total: 2.2 g/dL (ref 1.5–4.5)
Glucose: 97 mg/dL (ref 70–99)
Potassium: 4.3 mmol/L (ref 3.5–5.2)
Sodium: 143 mmol/L (ref 134–144)
Total Protein: 6.8 g/dL (ref 6.0–8.5)
eGFR: 73 mL/min/1.73 (ref >59)

## 2024-06-05 LAB — CBC WITH DIFFERENTIAL/PLATELET
Basophils Absolute: 0 x10E3/uL (ref 0.0–0.2)
Basos: 0 %
EOS (ABSOLUTE): 0.1 x10E3/uL (ref 0.0–0.4)
Eos: 2 %
Hematocrit: 44.2 % (ref 34.0–46.6)
Hemoglobin: 14.2 g/dL (ref 11.1–15.9)
Immature Grans (Abs): 0 x10E3/uL (ref 0.0–0.1)
Immature Granulocytes: 0 %
Lymphocytes Absolute: 3.2 x10E3/uL — ABNORMAL HIGH (ref 0.7–3.1)
Lymphs: 41 %
MCH: 29.7 pg (ref 26.6–33.0)
MCHC: 32.1 g/dL (ref 31.5–35.7)
MCV: 93 fL (ref 79–97)
Monocytes Absolute: 0.6 x10E3/uL (ref 0.1–0.9)
Monocytes: 8 %
Neutrophils Absolute: 3.9 x10E3/uL (ref 1.4–7.0)
Neutrophils: 49 %
Platelets: 299 x10E3/uL (ref 150–450)
RBC: 4.78 x10E6/uL (ref 3.77–5.28)
RDW: 12.7 % (ref 11.7–15.4)
WBC: 7.9 x10E3/uL (ref 3.4–10.8)

## 2024-06-05 LAB — LIPID PANEL
Chol/HDL Ratio: 2.4 ratio (ref 0.0–4.4)
Cholesterol, Total: 100 mg/dL (ref 100–199)
HDL: 41 mg/dL (ref >39)
LDL Chol Calc (NIH): 37 mg/dL (ref 0–99)
Triglycerides: 122 mg/dL (ref 0–149)
VLDL Cholesterol Cal: 22 mg/dL (ref 5–40)

## 2024-06-05 LAB — HEMOGLOBIN A1C
Est. average glucose Bld gHb Est-mCnc: 151 mg/dL
Hgb A1c MFr Bld: 6.9 % — ABNORMAL HIGH (ref 4.8–5.6)

## 2024-06-05 LAB — SICKLE CELL SCREEN

## 2024-06-07 ENCOUNTER — Other Ambulatory Visit: Payer: Self-pay

## 2024-06-07 DIAGNOSIS — R7689 Other specified abnormal immunological findings in serum: Secondary | ICD-10-CM

## 2024-06-07 DIAGNOSIS — R748 Abnormal levels of other serum enzymes: Secondary | ICD-10-CM

## 2024-06-07 NOTE — Telephone Encounter (Signed)
 Labs have been ordered and will be mailed to the pt to have completed in 3 mths.

## 2024-07-15 ENCOUNTER — Encounter: Payer: Self-pay | Admitting: Gastroenterology

## 2024-07-27 ENCOUNTER — Other Ambulatory Visit (HOSPITAL_COMMUNITY): Payer: Self-pay | Admitting: Family Medicine

## 2024-07-27 ENCOUNTER — Encounter: Payer: Self-pay | Admitting: Family Medicine

## 2024-07-27 DIAGNOSIS — Z1231 Encounter for screening mammogram for malignant neoplasm of breast: Secondary | ICD-10-CM

## 2024-08-04 ENCOUNTER — Encounter (HOSPITAL_COMMUNITY): Payer: Self-pay

## 2024-08-04 ENCOUNTER — Ambulatory Visit (HOSPITAL_COMMUNITY)
Admission: RE | Admit: 2024-08-04 | Discharge: 2024-08-04 | Disposition: A | Discharge status: Home / Self Care | Source: Ambulatory Visit | Attending: Family Medicine | Admitting: Family Medicine

## 2024-08-04 DIAGNOSIS — Z1231 Encounter for screening mammogram for malignant neoplasm of breast: Secondary | ICD-10-CM | POA: Insufficient documentation

## 2024-08-08 ENCOUNTER — Encounter: Payer: Self-pay | Admitting: Family Medicine

## 2024-08-10 NOTE — Telephone Encounter (Signed)
 [employer] ins added

## 2024-08-24 ENCOUNTER — Ambulatory Visit: Admitting: Gastroenterology

## 2024-10-01 ENCOUNTER — Ambulatory Visit: Admitting: Family Medicine
